# Patient Record
Sex: Female | Born: 1935
Health system: Southern US, Community
[De-identification: ages and names within clinical notes are randomized; demographics above are authoritative.]

## PROBLEM LIST (undated history)

## (undated) DIAGNOSIS — I10 Essential (primary) hypertension: Secondary | ICD-10-CM

## (undated) DIAGNOSIS — D649 Anemia, unspecified: Secondary | ICD-10-CM

## (undated) DIAGNOSIS — M1990 Unspecified osteoarthritis, unspecified site: Secondary | ICD-10-CM

## (undated) DIAGNOSIS — N281 Cyst of kidney, acquired: Secondary | ICD-10-CM

## (undated) DIAGNOSIS — M810 Age-related osteoporosis without current pathological fracture: Secondary | ICD-10-CM

## (undated) HISTORY — PX: BLADDER SUSPENSION: SHX72

## (undated) HISTORY — PX: CATARACT EXTRACTION: SUR2

## (undated) HISTORY — PX: TONSILLECTOMY: SUR1361

## (undated) HISTORY — PX: APPENDECTOMY: SHX54

---

## 1993-10-01 HISTORY — PX: REPLACEMENT TOTAL KNEE: SUR1224

## 1998-07-12 ENCOUNTER — Other Ambulatory Visit: Admission: RE | Admit: 1998-07-12 | Discharge: 1998-07-12 | Payer: Self-pay | Admitting: Obstetrics & Gynecology

## 1998-10-11 ENCOUNTER — Ambulatory Visit (HOSPITAL_COMMUNITY): Admission: RE | Admit: 1998-10-11 | Discharge: 1998-10-11 | Payer: Self-pay | Admitting: Gastroenterology

## 1999-07-24 ENCOUNTER — Other Ambulatory Visit: Admission: RE | Admit: 1999-07-24 | Discharge: 1999-07-24 | Payer: Self-pay | Admitting: Obstetrics & Gynecology

## 2002-10-13 ENCOUNTER — Other Ambulatory Visit: Admission: RE | Admit: 2002-10-13 | Discharge: 2002-10-13 | Payer: Self-pay | Admitting: Obstetrics & Gynecology

## 2003-09-08 ENCOUNTER — Encounter: Admission: RE | Admit: 2003-09-08 | Discharge: 2003-09-08 | Payer: Self-pay

## 2006-01-16 ENCOUNTER — Ambulatory Visit: Payer: Self-pay | Admitting: Internal Medicine

## 2006-01-21 ENCOUNTER — Ambulatory Visit: Payer: Self-pay | Admitting: Gastroenterology

## 2006-01-28 ENCOUNTER — Ambulatory Visit: Payer: Self-pay | Admitting: Internal Medicine

## 2006-02-07 ENCOUNTER — Ambulatory Visit: Payer: Self-pay | Admitting: Internal Medicine

## 2007-08-04 ENCOUNTER — Encounter: Admission: RE | Admit: 2007-08-04 | Discharge: 2007-08-04 | Payer: Self-pay | Admitting: Internal Medicine

## 2013-11-23 ENCOUNTER — Ambulatory Visit: Payer: Self-pay | Admitting: Cardiology

## 2013-12-17 ENCOUNTER — Emergency Department (HOSPITAL_BASED_OUTPATIENT_CLINIC_OR_DEPARTMENT_OTHER)
Admission: EM | Admit: 2013-12-17 | Discharge: 2013-12-17 | Disposition: A | Discharge status: Home / Self Care | Payer: Medicare Other | Attending: Emergency Medicine | Admitting: Emergency Medicine

## 2013-12-17 ENCOUNTER — Encounter (HOSPITAL_BASED_OUTPATIENT_CLINIC_OR_DEPARTMENT_OTHER): Payer: Self-pay | Admitting: Emergency Medicine

## 2013-12-17 ENCOUNTER — Emergency Department (HOSPITAL_BASED_OUTPATIENT_CLINIC_OR_DEPARTMENT_OTHER): Payer: Medicare Other

## 2013-12-17 DIAGNOSIS — Z862 Personal history of diseases of the blood and blood-forming organs and certain disorders involving the immune mechanism: Secondary | ICD-10-CM | POA: Insufficient documentation

## 2013-12-17 DIAGNOSIS — S8010XA Contusion of unspecified lower leg, initial encounter: Secondary | ICD-10-CM | POA: Insufficient documentation

## 2013-12-17 DIAGNOSIS — R296 Repeated falls: Secondary | ICD-10-CM | POA: Insufficient documentation

## 2013-12-17 DIAGNOSIS — Y9389 Activity, other specified: Secondary | ICD-10-CM | POA: Insufficient documentation

## 2013-12-17 DIAGNOSIS — I10 Essential (primary) hypertension: Secondary | ICD-10-CM | POA: Insufficient documentation

## 2013-12-17 DIAGNOSIS — S52509A Unspecified fracture of the lower end of unspecified radius, initial encounter for closed fracture: Secondary | ICD-10-CM

## 2013-12-17 DIAGNOSIS — S52599A Other fractures of lower end of unspecified radius, initial encounter for closed fracture: Secondary | ICD-10-CM | POA: Insufficient documentation

## 2013-12-17 DIAGNOSIS — Y929 Unspecified place or not applicable: Secondary | ICD-10-CM | POA: Insufficient documentation

## 2013-12-17 DIAGNOSIS — Z87448 Personal history of other diseases of urinary system: Secondary | ICD-10-CM | POA: Insufficient documentation

## 2013-12-17 DIAGNOSIS — Z8739 Personal history of other diseases of the musculoskeletal system and connective tissue: Secondary | ICD-10-CM | POA: Insufficient documentation

## 2013-12-17 HISTORY — DX: Anemia, unspecified: D64.9

## 2013-12-17 HISTORY — DX: Age-related osteoporosis without current pathological fracture: M81.0

## 2013-12-17 HISTORY — DX: Cyst of kidney, acquired: N28.1

## 2013-12-17 HISTORY — DX: Essential (primary) hypertension: I10

## 2013-12-17 HISTORY — DX: Unspecified osteoarthritis, unspecified site: M19.90

## 2013-12-17 MED ORDER — HYDROCODONE-ACETAMINOPHEN 5-325 MG PO TABS: 0.5000 | ORAL_TABLET | ORAL | Status: DC | PRN

## 2013-12-17 NOTE — ED Notes (Signed)
Patient transported to X-ray via wheelchair per tech.

## 2013-12-17 NOTE — ED Notes (Signed)
[place] [address] attempting to get into shower PTA.  C/O left wrist pain and bruising to left lower leg.

## 2013-12-17 NOTE — Discharge Instructions (Signed)
Wrist Fracture A wrist fracture is a break in one of the bones of the wrist. Your wrist is made up of several small bones at the palm of your hand (carpal bones) and the two bones that make up your forearm (radius and ulna). The bones come together to form multiple large and small joints. The shape and design of these joints allow your wrist to bend and straighten, move side-to-side, and rotate, as in twisting your palm up or down. CAUSES  A fracture may occur in any of the bones in your wrist when enough force is applied to the wrist, such as when falling down onto an outstretched hand. Severe injuries may occur from a more forceful injury. SYMPTOMS Symptoms of wrist fractures include tenderness, bruising, and swelling. Additionally, the wrist may hang in an odd position or may be misshaped. DIAGNOSIS To diagnose a wrist fracture, your caregiver will physically examine your wrist. Your caregiver may also request an X-ray exam of your wrist. TREATMENT Treatment depends on many factors, including the nature and location of the fracture, your age, and your activity level. Treatment for wrist fracture can be nonsurgical or surgical. For nonsurgical treatment, a plaster cast or splint may be applied to your wrist if the bone is in a good position (aligned). The cast will stay on for about 6 weeks. If the alignment of your bone is not good, it may be necessary to realign (reduce) it. After the bone is reduced, a splint usually is placed on your wrist to allow for a small amount of normal swelling. After about 1 week, the splint is removed and a cast is added. The cast is removed 2 or 3 weeks later, after the swelling goes down, causing the cast to loosen. Another cast is applied. This cast is removed after about another 2 or 3 weeks, for a total of 4 to 6 weeks of immobilization. Sometimes the position of the bone is so far out of place that surgery is required to apply a device to hold it together as it  heals. If the bone cannot be reduced without cutting the skin around the bone (closed reduction), a cut (incision) is made to allow direct access to the bone to reduce it (open reduction). Depending on the fracture, there are a number of options for holding the bone in place while it heals, including a cast, metal pins, a plate and screws, and a device called an external fixator. With an external fixator, most of the hardware remains outside of the body. HOME CARE INSTRUCTIONS  To lessen swelling, keep your injured wrist elevated and move your fingers as much as possible.  Apply ice to your wrist for the first 1 to 2 days after you have been treated or as directed by your caregiver. Applying ice helps to reduce inflammation and pain.  Put ice in a plastic bag.  Place a towel between your skin and the bag.  Leave the ice on for 15 to 20 minutes at a time, every 2 hours while you are awake.  Do not put pressure on any part of your cast or splint. It may break.  Use a plastic bag to protect your cast or splint from water while bathing or showering. Do not lower your cast or splint into water.  Only take over-the-counter or prescription medicines for pain as directed by your caregiver. SEEK IMMEDIATE MEDICAL CARE IF:   Your cast or splint gets damaged or breaks.  You have continued severe pain   or more swelling than you did before the cast was put on.  Your skin or fingernails below the injury turn blue or gray or feel cold or numb.  You develop decreased feeling in your fingers. MAKE SURE YOU:  Understand these instructions.  Will watch your condition.  Will get help right away if you are not doing well or get worse. Document Released: 06/27/2005 Document Revised: 12/10/2011 Document Reviewed: 10/05/2011 Exit[employer] Patient Information 2014 [employer].  

## 2013-12-17 NOTE — ED Provider Notes (Signed)
CSN: [id number]     Arrival date & time 12/17/13  0814 History   First MD Initiated Contact with Patient 12/17/13 [phone]     Chief Complaint  Patient presents with  . Fall  . Wrist Pain     (Consider location/radiation/quality/duration/timing/severity/associated sxs/prior Treatment) HPI Comments: Patient presents to ER for evaluation of left wrist injury. Patient reports that she is trying to get into the shower this morning and lost her balance. She fell, landing on left wrist. She has been having pain in the left wrist that worsens with movement. Pain is moderate. She also has noticed a bruise on her left calf area. She denies ankle or knee pain. There was no head injury or loss of consciousness. Patient denies neck and back pain.  Patient is a 80 y.o. female presenting with fall and wrist pain.  Fall  Wrist Pain    Past Medical History  Diagnosis Date  . Hypertension   . Renal cyst   . OA (osteoarthritis)   . Osteoporosis   . Anemia    Past Surgical History  Procedure Laterality Date  . Cesarean section    . Knee surgery    . Bladder suspension    . Cataract extraction     No family history on file. History  Substance Use Topics  . Smoking status: Never Smoker   . Smokeless tobacco: Never Used  . Alcohol Use: No   OB History   Grav Para Term Preterm Abortions TAB SAB Ect Mult Living                 Review of Systems  Musculoskeletal: Positive for arthralgias. Negative for back pain and neck pain.  Skin:       Bruise  All other systems reviewed and are negative.      Allergies  Review of patient's allergies indicates not on file.  Home Medications  No current outpatient prescriptions on file. BP 159/100  Pulse 61  Temp(Src) 98 F (36.7 C) (Oral)  Resp 16  Ht 5\' 1"  (1.549 m)  Wt 145 lb (65.772 kg)  BMI 27.41 kg/m2  SpO2 100% Physical Exam  Constitutional: She is oriented to person, place, and time. She appears well-developed and well-nourished.  No distress.  HENT:  Head: Normocephalic and atraumatic.  Right Ear: Hearing normal.  Left Ear: Hearing normal.  Nose: Nose normal.  Mouth/Throat: Oropharynx is clear and moist and mucous membranes are normal.  Eyes: Conjunctivae and EOM are normal. Pupils are equal, round, and reactive to light.  Neck: Normal range of motion. Neck supple.  Cardiovascular: Regular rhythm, S1 normal and S2 normal.  Exam reveals no gallop and no friction rub.   No murmur heard. Pulmonary/Chest: Effort normal and breath sounds normal. No respiratory distress. She exhibits no tenderness.  Abdominal: Soft. Normal appearance and bowel sounds are normal. There is no hepatosplenomegaly. There is no tenderness. There is no rebound, no guarding, no tenderness at McBurney's point and negative Murphy's sign. No hernia.  Musculoskeletal: Normal range of motion.       Left wrist: She exhibits tenderness and swelling.       Left lower leg: She exhibits no tenderness and no bony tenderness.       Legs: Neurological: She is alert and oriented to person, place, and time. She has normal strength. No cranial nerve deficit or sensory deficit. Coordination normal. GCS eye subscore is 4. GCS verbal subscore is 5. GCS motor subscore is 6.  Skin: Skin  is warm, dry and intact. No rash noted. No cyanosis.  Psychiatric: She has a normal mood and affect. Her speech is normal and behavior is normal. Thought content normal.    ED Course  Procedures (including critical care time) Labs Review Labs Reviewed - No data to display Imaging Review No results found.   EKG Interpretation None      MDM   Final diagnoses:  None   Patient fell while attempting to get into the shower today and injured her left wrist. She has isolated injury to left wrist, other than slight soft tissue retention the left calf region. No concern for bony injury of the leg. No concern for head injury. Patient has normal neurologic exam, no signs of head  trauma. Neck and back were clinically cleared. X-ray does show wrist fracture. The patient has her own orthopedic doctor that she would like to followup with. She was placed in a splint, will be prescribed hydrocodone to use as needed.   Orpah Greek, MD 12/17/13 1046

## 2015-11-29 DIAGNOSIS — M6281 Muscle weakness (generalized): Secondary | ICD-10-CM | POA: Diagnosis not present

## 2015-12-05 DIAGNOSIS — M6281 Muscle weakness (generalized): Secondary | ICD-10-CM | POA: Diagnosis not present

## 2016-01-26 DIAGNOSIS — H00015 Hordeolum externum left lower eyelid: Secondary | ICD-10-CM | POA: Diagnosis not present

## 2016-01-26 DIAGNOSIS — H00012 Hordeolum externum right lower eyelid: Secondary | ICD-10-CM | POA: Diagnosis not present

## 2016-03-02 DIAGNOSIS — H0012 Chalazion right lower eyelid: Secondary | ICD-10-CM | POA: Diagnosis not present

## 2016-05-02 DIAGNOSIS — C44629 Squamous cell carcinoma of skin of left upper limb, including shoulder: Secondary | ICD-10-CM | POA: Diagnosis not present

## 2016-05-21 DIAGNOSIS — D6489 Other specified anemias: Secondary | ICD-10-CM | POA: Diagnosis not present

## 2016-05-21 DIAGNOSIS — I10 Essential (primary) hypertension: Secondary | ICD-10-CM | POA: Diagnosis not present

## 2016-05-21 DIAGNOSIS — N183 Chronic kidney disease, stage 3 (moderate): Secondary | ICD-10-CM | POA: Diagnosis not present

## 2016-05-21 DIAGNOSIS — Z6827 Body mass index (BMI) 27.0-27.9, adult: Secondary | ICD-10-CM | POA: Diagnosis not present

## 2016-05-21 DIAGNOSIS — I872 Venous insufficiency (chronic) (peripheral): Secondary | ICD-10-CM | POA: Diagnosis not present

## 2016-05-21 DIAGNOSIS — R600 Localized edema: Secondary | ICD-10-CM | POA: Diagnosis not present

## 2016-08-28 DIAGNOSIS — Z961 Presence of intraocular lens: Secondary | ICD-10-CM | POA: Diagnosis not present

## 2016-08-28 DIAGNOSIS — H353132 Nonexudative age-related macular degeneration, bilateral, intermediate dry stage: Secondary | ICD-10-CM | POA: Diagnosis not present

## 2016-08-28 DIAGNOSIS — Z0100 Encounter for examination of eyes and vision without abnormal findings: Secondary | ICD-10-CM | POA: Diagnosis not present

## 2016-09-07 DIAGNOSIS — F5104 Psychophysiologic insomnia: Secondary | ICD-10-CM | POA: Diagnosis not present

## 2016-09-07 DIAGNOSIS — I872 Venous insufficiency (chronic) (peripheral): Secondary | ICD-10-CM | POA: Diagnosis not present

## 2016-09-07 DIAGNOSIS — I351 Nonrheumatic aortic (valve) insufficiency: Secondary | ICD-10-CM | POA: Diagnosis not present

## 2016-09-07 DIAGNOSIS — I10 Essential (primary) hypertension: Secondary | ICD-10-CM | POA: Diagnosis not present

## 2016-09-07 DIAGNOSIS — J310 Chronic rhinitis: Secondary | ICD-10-CM | POA: Diagnosis not present

## 2016-09-07 DIAGNOSIS — Z6827 Body mass index (BMI) 27.0-27.9, adult: Secondary | ICD-10-CM | POA: Diagnosis not present

## 2016-09-07 DIAGNOSIS — N183 Chronic kidney disease, stage 3 (moderate): Secondary | ICD-10-CM | POA: Diagnosis not present

## 2016-09-07 DIAGNOSIS — M810 Age-related osteoporosis without current pathological fracture: Secondary | ICD-10-CM | POA: Diagnosis not present

## 2016-09-07 DIAGNOSIS — E538 Deficiency of other specified B group vitamins: Secondary | ICD-10-CM | POA: Diagnosis not present

## 2016-09-07 DIAGNOSIS — N390 Urinary tract infection, site not specified: Secondary | ICD-10-CM | POA: Diagnosis not present

## 2016-09-07 DIAGNOSIS — D6489 Other specified anemias: Secondary | ICD-10-CM | POA: Diagnosis not present

## 2016-09-07 DIAGNOSIS — Z0000 Encounter for general adult medical examination without abnormal findings: Secondary | ICD-10-CM | POA: Diagnosis not present

## 2016-09-07 DIAGNOSIS — R2689 Other abnormalities of gait and mobility: Secondary | ICD-10-CM | POA: Diagnosis not present

## 2016-09-07 DIAGNOSIS — Z1389 Encounter for screening for other disorder: Secondary | ICD-10-CM | POA: Diagnosis not present

## 2016-09-07 DIAGNOSIS — R8299 Other abnormal findings in urine: Secondary | ICD-10-CM | POA: Diagnosis not present

## 2017-06-13 ENCOUNTER — Encounter: Payer: Self-pay | Admitting: *Deleted

## 2017-07-22 ENCOUNTER — Non-Acute Institutional Stay: Payer: PPO | Admitting: Internal Medicine

## 2017-07-22 ENCOUNTER — Encounter: Payer: Self-pay | Admitting: Internal Medicine

## 2017-07-22 VITALS — BP 124/64 | HR 68 | Temp 97.7°F | Resp 18 | Ht 59.0 in | Wt 151.6 lb

## 2017-07-22 DIAGNOSIS — I739 Peripheral vascular disease, unspecified: Secondary | ICD-10-CM | POA: Insufficient documentation

## 2017-07-22 DIAGNOSIS — F5105 Insomnia due to other mental disorder: Secondary | ICD-10-CM | POA: Insufficient documentation

## 2017-07-22 DIAGNOSIS — D509 Iron deficiency anemia, unspecified: Secondary | ICD-10-CM

## 2017-07-22 DIAGNOSIS — M159 Polyosteoarthritis, unspecified: Secondary | ICD-10-CM | POA: Insufficient documentation

## 2017-07-22 DIAGNOSIS — M810 Age-related osteoporosis without current pathological fracture: Secondary | ICD-10-CM | POA: Diagnosis not present

## 2017-07-22 DIAGNOSIS — M76821 Posterior tibial tendinitis, right leg: Secondary | ICD-10-CM

## 2017-07-22 DIAGNOSIS — R251 Tremor, unspecified: Secondary | ICD-10-CM | POA: Diagnosis not present

## 2017-07-22 DIAGNOSIS — G4700 Insomnia, unspecified: Secondary | ICD-10-CM | POA: Diagnosis not present

## 2017-07-22 DIAGNOSIS — I10 Essential (primary) hypertension: Secondary | ICD-10-CM

## 2017-07-22 DIAGNOSIS — G2581 Restless legs syndrome: Secondary | ICD-10-CM | POA: Diagnosis not present

## 2017-07-22 DIAGNOSIS — H6123 Impacted cerumen, bilateral: Secondary | ICD-10-CM

## 2017-07-22 NOTE — Patient Instructions (Addendum)
  Take melatonin 5 mg daily from tonight. Take it one hour before going to bed.  Change your xanax to every other day at bedtime for 1 week Tuesday, Thursday and Saturday, then take it twice a week for Tuesday and Saturday next week, then take it once a week on Wednesday and stop it.   don't forget to bring your health care power of attorney and living will next visit.

## 2017-07-22 NOTE — Progress Notes (Signed)
[facility]  Provider: Blanchie Serve MD   Location:  [place] of Service:  Clinic (12)  PCP: Blanchie Serve, MD Patient Care Team: Blanchie Serve, MD as PCP - General (Internal Medicine)  Extended Emergency Contact Information Primary Emergency Contact: [facility] Address: [address]          [place] [address] [place] Phone: [phone] Relation: Son Secondary Emergency Contact: Sachdev,Garth Address: [address]          [place], [place] [address] [place] Phone: [phone] Relation: Son  Goals of Care: Advanced Directive information Advanced Directives 07/22/2017  Does Patient Have a Medical Advance Directive? Yes  Type of Paramedic of [place];Living will      Chief Complaint  Patient presents with  . New Patient (Initial Visit)    establish care.   . Medication Refill    No refills needed at this time.     HPI: Patient is a 83 y.o. female seen today to establish care. She was seeing Dr Sharlett Iles as her PCP roughly 6 months back. She followed at [facility]. She would see him once a year for physical and as needed in between. No medical records for review.   Hypertension- currently on metoprolol succinate 50 mg bid, lisinopril 5 mg bid, HCTZ 25 mg daily and lasix 20 mg twice a week.   PVD- currently on lasix 20 mg twice a week. Takes it when she feels like. Last took it 2 weeks back.   Insomnia- takes alprazolam 0.25 mg 1 tab daily at bedtime as needed.   RLS- currently on requip 1 mg qhs.   OA- to multiple joints, denies pain, mobility limited  Posterior tibial tendon dysfunction- with right foot valgus. Has orthotics and deep shoes. No fall reported.   Took influenza vaccine 07/10/17.  Depression screen PHQ 2/9 07/22/2017  Decreased Interest 0  Down, Depressed, Hopeless 0  PHQ - 2 Score 0    Past Medical History:  Diagnosis Date    . Anemia   . Hypertension   . OA (osteoarthritis)   . Osteoporosis   . Renal cyst    Past Surgical History:  Procedure Laterality Date  . APPENDECTOMY    . BLADDER SUSPENSION    . CATARACT EXTRACTION    . CESAREAN SECTION    . REPLACEMENT TOTAL KNEE  1995  . TONSILLECTOMY      reports that she has been smoking E-cigarettes.  She has never used smokeless tobacco. She reports that she does not drink alcohol or use drugs. Social History   Social History  . Marital status: Widowed    Spouse name: N/A  . Number of children: 2  . Years of education: 3   Occupational History  . Retired Marine scientist    Social History Main Topics  . Smoking status: Current Every Day Smoker    Types: E-cigarettes  . Smokeless tobacco: Never Used  . Alcohol use No  . Drug use: No  . Sexual activity: No   Other Topics Concern  . Not on file   Social History Narrative   Coffee-with Caffeine   Widow   2 sons   No pets   Retired Therapist, sports   No exercise   HCPOA, Living Will, No DNR   + glasses   + hearing aids       Functional Status Survey:    Family History  Problem Relation Age of Onset  . Colon cancer Mother   . Arthritis Mother   . Heart disease Father   . Heart attack Father   . Migraines Sister   . Allergies Sister   . Asthma Sister     Health Maintenance  Topic Date Due  . Samul Dada  05/17/1941  . DEXA SCAN  05/16/1989  . PNA vac Low Risk Adult (1 of 2 - PCV13) 05/16/1989  . INFLUENZA VACCINE  05/01/2017    No Known Allergies  Outpatient Encounter Prescriptions as of 07/22/2017  Medication Sig  . ALPRAZolam (XANAX) 0.25 MG tablet Take by mouth at bedtime as needed for anxiety. Take 1-2 tablets  . Biotin 1000 MCG tablet Take 1,000 mcg by mouth daily.  . furosemide (LASIX) 20 MG tablet Take 20 mg by mouth 2 (two) times a week.  . hydrochlorothiazide (MICROZIDE) 12.5 MG capsule Take 25 mg by mouth daily.   Marland Kitchen lisinopril (PRINIVIL,ZESTRIL) 5 MG tablet Take 5 mg by mouth 2  (two) times daily.   . metoprolol succinate (TOPROL-XL) 50 MG 24 hr tablet Take 50 mg by mouth 2 (two) times daily. Take with or immediately following a meal.   . multivitamin-lutein (OCUVITE-LUTEIN) CAPS capsule Take 1 capsule by mouth daily.  Marland Kitchen rOPINIRole (REQUIP) 1 MG tablet Take 1 mg by mouth at bedtime.   . [DISCONTINUED] BIOTIN PO Take 1 tablet by mouth daily.  . [DISCONTINUED] aspirin 81 MG tablet Take 81 mg by mouth daily.  . [DISCONTINUED] cholecalciferol (VITAMIN D) 400 UNITS TABS tablet Take 400 Units by mouth.  . [DISCONTINUED] vitamin B-12 (CYANOCOBALAMIN) 100 MCG tablet Take 100 mcg by mouth daily.   No facility-administered encounter medications on file as of 07/22/2017.     Review of Systems  Constitutional: Negative for appetite change, chills, diaphoresis and fever.  HENT: Positive for hearing loss and rhinorrhea. Negative for congestion, ear discharge, ear pain, mouth sores, nosebleeds, sinus pain, sinus pressure, sore throat and trouble swallowing.        Has hearing aids  Eyes: Positive for visual disturbance. Negative for pain, discharge, redness and itching.       Has corrective glasses and sees eye doctor once a year.   Respiratory: Positive for wheezing. Negative for cough, choking and shortness of breath.   Cardiovascular: Positive for leg swelling. Negative for chest pain and palpitations.       Leg edema improves early in the morning and increases during the day.   Gastrointestinal: Negative for abdominal pain, blood in stool, constipation, diarrhea, nausea and vomiting.  Genitourinary: Negative for dysuria, flank pain, frequency, hematuria, pelvic pain and vaginal discharge.  Musculoskeletal: Positive for gait problem. Negative for arthralgias, back pain and myalgias.       Gets around with motorized wheelchair. Has history of chronic posterior tibial tendon insufficiency of right foot with a pronated valgus foot. Uses custom orthotics and extra depth shoes. Does  not wear her upright brace due to weight.   Skin: Negative for rash and wound.  Neurological: Positive for tremors. Negative for dizziness, seizures, syncope, weakness, numbness and headaches.  Hematological: Bruises/bleeds easily.  Psychiatric/Behavioral: Negative for behavioral problems, decreased concentration, dysphoric mood, hallucinations, self-injury and suicidal ideas.    Vitals:   07/22/17 1400  BP: 124/64  Pulse: 68  Resp: 18  Temp: 97.7 F (36.5 C)  TempSrc: Oral  SpO2: 98%  Weight: 151 lb 9.6 oz (68.8 kg)  Height: 4' 11" (1.499 m)   Body mass index  is 30.62 kg/m. Physical Exam  Constitutional: She is oriented to person, place, and time. She appears well-developed. No distress.  obese  HENT:  Head: Normocephalic and atraumatic.  Nose: Nose normal.  Mouth/Throat: Oropharynx is clear and moist. No oropharyngeal exudate.  Cerumen to both ears right > left  Eyes: Pupils are equal, round, and reactive to light. Conjunctivae and EOM are normal. Right eye exhibits no discharge. Left eye exhibits no discharge. No scleral icterus.  Has corrective glasses  Neck: Normal range of motion. Neck supple. No tracheal deviation present. No thyromegaly present.  Cardiovascular: Normal rate and regular rhythm.   Pulmonary/Chest: Effort normal and breath sounds normal. No respiratory distress. She has no wheezes. She has no rales. She exhibits no tenderness.  Abdominal: Soft. Bowel sounds are normal. She exhibits no distension. There is no tenderness. There is no rebound and no guarding.  Musculoskeletal: She exhibits deformity.  Good ROM with shoulders. Arthritis changes to fingers. 1+ right and trace left leg edema. Pronated right valgus foot. Scoliosis present  Lymphadenopathy:    She has no cervical adenopathy.  Neurological: She is alert and oriented to person, place, and time.  Skin: Skin is warm and dry. She is not diaphoretic.  Chronic stasis changes to her legs  Psychiatric:  She has a normal mood and affect. Her behavior is normal.    Labs reviewed: Basic Metabolic Panel: No results for input(s): NA, K, CL, CO2, GLUCOSE, BUN, CREATININE, CALCIUM, MG, PHOS in the last 8760 hours. Liver Function Tests: No results for input(s): AST, ALT, ALKPHOS, BILITOT, PROT, ALBUMIN in the last 8760 hours. No results for input(s): LIPASE, AMYLASE in the last 8760 hours. No results for input(s): AMMONIA in the last 8760 hours. CBC: No results for input(s): WBC, NEUTROABS, HGB, HCT, MCV, PLT in the last 8760 hours. Cardiac Enzymes: No results for input(s): CKTOTAL, CKMB, CKMBINDEX, TROPONINI in the last 8760 hours. BNP: Invalid input(s): POCBNP No results found for: HGBA1C No results found for: TSH No results found for: VITAMINB12 No results found for: FOLATE No results found for: IRON, TIBC, FERRITIN  Lipid Panel: No results for input(s): CHOL, HDL, LDLCALC, TRIG, CHOLHDL, LDLDIRECT in the last 8760 hours. No results found for: HGBA1C  Procedures since last visit: No results found.  Assessment/Plan  1. Essential hypertension Continue hctz, lisinopril and metoprolol. Monitor BP - CMP with eGFR; Future - Lipid Panel; Future - TSH; Future  2. PVD (peripheral vascular disease) (HCC) Compression stockings advised. Keep legs elevated at rest. Continue skin care.   3. Posterior tibial tendon dysfunction, right Continue orthotics and deep insert shoes. Fall precautions.   4. RLS (restless legs syndrome) Continue requip - TSH; Future - CBC with Differential/Platelets; Future - Ferritin; Future  5. Insomnia, unspecified type Start melatonin 5 mg daily and taper her off xanax that she has been taking on daily basis as in pt instruction.   6. Osteoporosis without current pathological fracture, unspecified osteoporosis type Pt was on fosamax before. No fall or pathological fracture reported. Consider calcium and vit d supplement after reviewing prior records and  labs  7. Osteoarthritis of multiple joints, unspecified osteoarthritis type Takes tylenol if needed. Not taking anything for pain at present.   8. Iron deficiency anemia, unspecified iron deficiency anemia type Not on any iron supplement. Denies melena or rectal bleed. Denies heartburn.  - TSH; Future - CBC with Differential/Platelets; Future - Ferritin; Future  9. Tremors of nervous system Obtain prior records to assess further.  -  TSH; Future  10. Cerumen both ears Pt to get ear lavage from [facility].   Labs/tests ordered:  As above  Next appointment: 6 months  Communication: reviewed care plan with patient and pt agrees with it.     Blanchie Serve, MD Internal Medicine [facility] [address] [place],  [address] Cell Phone (Monday-Friday 8 am - 5 pm): [phone] On Call: [phone] and follow prompts after 5 pm and on weekends Office Phone: [phone] Office Fax: [phone]

## 2017-07-29 DIAGNOSIS — D509 Iron deficiency anemia, unspecified: Secondary | ICD-10-CM | POA: Diagnosis not present

## 2017-07-29 DIAGNOSIS — G2581 Restless legs syndrome: Secondary | ICD-10-CM | POA: Diagnosis not present

## 2017-07-29 DIAGNOSIS — I10 Essential (primary) hypertension: Secondary | ICD-10-CM | POA: Diagnosis not present

## 2017-07-29 DIAGNOSIS — R251 Tremor, unspecified: Secondary | ICD-10-CM | POA: Diagnosis not present

## 2017-07-29 LAB — FERRITIN: Ferritin: 86 ng/mL (ref 20–288)

## 2017-07-29 LAB — COMPLETE METABOLIC PANEL WITH GFR
AG Ratio: 1.4 (calc) (ref 1.0–2.5)
ALT: 12 U/L (ref 6–29)
AST: 19 U/L (ref 10–35)
Albumin: 4 g/dL (ref 3.6–5.1)
Alkaline phosphatase (APISO): 58 U/L (ref 33–130)
BUN/Creatinine Ratio: 22 (calc) (ref 6–22)
BUN: 24 mg/dL (ref 7–25)
CO2: 28 mmol/L (ref 20–32)
Calcium: 9.6 mg/dL (ref 8.6–10.4)
Chloride: 95 mmol/L — ABNORMAL LOW (ref 98–110)
Creat: 1.07 mg/dL — ABNORMAL HIGH (ref 0.60–0.88)
GFR, Est African American: 52 mL/min/{1.73_m2} — ABNORMAL LOW (ref >=60)
GFR, Est Non African American: 45 mL/min/{1.73_m2} — ABNORMAL LOW (ref >=60)
Globulin: 2.8 g/dL (calc) (ref 1.9–3.7)
Glucose, Bld: 103 mg/dL — ABNORMAL HIGH (ref 65–99)
Potassium: 4.1 mmol/L (ref 3.5–5.3)
Sodium: 135 mmol/L (ref 135–146)
Total Bilirubin: 0.8 mg/dL (ref 0.2–1.2)
Total Protein: 6.8 g/dL (ref 6.1–8.1)

## 2017-07-29 LAB — CBC WITH DIFFERENTIAL/PLATELET
Basophils Absolute: 37 cells/uL (ref 0–200)
Basophils Relative: 0.6 %
Eosinophils Absolute: 112 cells/uL (ref 15–500)
Eosinophils Relative: 1.8 %
HCT: 40.1 % (ref 35.0–45.0)
Hemoglobin: 13.9 g/dL (ref 11.7–15.5)
Lymphs Abs: 1984 cells/uL (ref 850–3900)
MCH: 34.8 pg — ABNORMAL HIGH (ref 27.0–33.0)
MCHC: 34.7 g/dL (ref 32.0–36.0)
MCV: 100.3 fL — ABNORMAL HIGH (ref 80.0–100.0)
MPV: 10.4 fL (ref 7.5–12.5)
Monocytes Relative: 10.7 %
Neutro Abs: 3404 cells/uL (ref 1500–7800)
Neutrophils Relative %: 54.9 %
Platelets: 190 10*3/uL (ref 140–400)
RBC: 4 10*6/uL (ref 3.80–5.10)
RDW: 13.8 % (ref 11.0–15.0)
Total Lymphocyte: 32 %
WBC mixed population: 663 cells/uL (ref 200–950)
WBC: 6.2 10*3/uL (ref 3.8–10.8)

## 2017-07-29 LAB — LIPID PANEL
Cholesterol: 189 mg/dL (ref <200)
HDL: 70 mg/dL (ref >50)
LDL Cholesterol (Calc): 102 mg/dL (calc) — ABNORMAL HIGH
Non-HDL Cholesterol (Calc): 119 mg/dL (calc) (ref <130)
Total CHOL/HDL Ratio: 2.7 (calc) (ref <5.0)
Triglycerides: 76 mg/dL (ref <150)

## 2017-07-29 LAB — TSH: TSH: 2.17 mIU/L (ref 0.40–4.50)

## 2017-08-08 ENCOUNTER — Other Ambulatory Visit: Payer: Self-pay | Admitting: Internal Medicine

## 2017-08-26 ENCOUNTER — Other Ambulatory Visit: Payer: Self-pay | Admitting: Internal Medicine

## 2017-09-11 ENCOUNTER — Other Ambulatory Visit: Payer: Self-pay | Admitting: Internal Medicine

## 2017-09-12 ENCOUNTER — Other Ambulatory Visit: Payer: Self-pay | Admitting: *Deleted

## 2017-09-12 ENCOUNTER — Telehealth: Payer: Self-pay | Admitting: *Deleted

## 2017-09-12 MED ORDER — ALPRAZOLAM 0.25 MG PO TABS: 0.2500 mg | ORAL_TABLET | Freq: Every evening | ORAL | 0 refills | Status: DC | PRN

## 2017-09-12 NOTE — Telephone Encounter (Signed)
Patient came to clinic and advised she spoke to [place] on her way out yesterday and asked for it to be sent in. I took care of it. #30 0RF per Dinah. Pt sees Dr. Bubba Camp next month.

## 2017-09-12 NOTE — Telephone Encounter (Signed)
Patient called and stated that she spoke with a CMA at [facility] yesterday and they were going to call in her Xanax Rx. Patient stated this has not been done. Needs Rx sent to [facility]acy. Please Verify instructions and send to [facility].

## 2017-09-25 ENCOUNTER — Other Ambulatory Visit: Payer: Self-pay | Admitting: Internal Medicine

## 2017-10-08 ENCOUNTER — Other Ambulatory Visit: Payer: Self-pay | Admitting: Internal Medicine

## 2017-10-10 ENCOUNTER — Other Ambulatory Visit: Payer: Self-pay | Admitting: Internal Medicine

## 2017-10-11 ENCOUNTER — Other Ambulatory Visit: Payer: Self-pay | Admitting: *Deleted

## 2017-10-11 MED ORDER — ALPRAZOLAM 0.25 MG PO TABS: 0.2500 mg | ORAL_TABLET | Freq: Every evening | ORAL | 0 refills | Status: DC | PRN

## 2017-10-11 NOTE — Telephone Encounter (Signed)
Verbal order called in. Requip was filled on 10/08/17

## 2017-10-11 NOTE — Telephone Encounter (Signed)
Patient called requesting refill on alprazolam 0.25 #30 0RF to [facility]. Called in as prescribed.

## 2017-10-14 ENCOUNTER — Telehealth: Payer: Self-pay | Admitting: *Deleted

## 2017-10-14 DIAGNOSIS — I10 Essential (primary) hypertension: Secondary | ICD-10-CM

## 2017-10-14 DIAGNOSIS — R251 Tremor, unspecified: Secondary | ICD-10-CM

## 2017-10-14 DIAGNOSIS — G2581 Restless legs syndrome: Secondary | ICD-10-CM

## 2017-10-14 DIAGNOSIS — N183 Chronic kidney disease, stage 3 unspecified: Secondary | ICD-10-CM

## 2017-10-14 DIAGNOSIS — D509 Iron deficiency anemia, unspecified: Secondary | ICD-10-CM

## 2017-10-14 NOTE — Addendum Note (Signed)
Addended by: Royann Shivers A on: 10/14/2017 01:26 PM   Modules accepted: Orders

## 2017-10-14 NOTE — Telephone Encounter (Signed)
Orders entered for labs

## 2017-10-31 DIAGNOSIS — N183 Chronic kidney disease, stage 3 (moderate): Secondary | ICD-10-CM | POA: Diagnosis not present

## 2017-10-31 LAB — BASIC METABOLIC PANEL WITH GFR
BUN / CREAT RATIO: 25 (calc) — AB (ref 6–22)
BUN: 23 mg/dL (ref 7–25)
CHLORIDE: 96 mmol/L — AB (ref 98–110)
CO2: 33 mmol/L — AB (ref 20–32)
CREATININE: 0.91 mg/dL — AB (ref 0.60–0.88)
Calcium: 9.1 mg/dL (ref 8.6–10.4)
GFR, Est African American: 63 mL/min/{1.73_m2} (ref >=60)
GFR, Est Non African American: 54 mL/min/{1.73_m2} — ABNORMAL LOW (ref >=60)
GLUCOSE: 91 mg/dL (ref 65–99)
POTASSIUM: 3.8 mmol/L (ref 3.5–5.3)
SODIUM: 136 mmol/L (ref 135–146)

## 2017-11-19 ENCOUNTER — Other Ambulatory Visit: Payer: Self-pay | Admitting: Internal Medicine

## 2017-11-25 ENCOUNTER — Other Ambulatory Visit: Payer: Self-pay | Admitting: Internal Medicine

## 2017-11-25 NOTE — Telephone Encounter (Signed)
[facility] Database Verified.

## 2017-11-26 ENCOUNTER — Telehealth: Payer: Self-pay | Admitting: Internal Medicine

## 2017-11-26 NOTE — Telephone Encounter (Signed)
I called the pt to schedule her AWV-S at [facility] clinic on either 11/27/17 or 12/06/17.  There was no answer and no option to leave a message. VDM (DD)

## 2017-11-27 ENCOUNTER — Other Ambulatory Visit: Payer: Self-pay | Admitting: *Deleted

## 2017-11-27 MED ORDER — HYDROCHLOROTHIAZIDE 12.5 MG PO CAPS: 25.0000 mg | ORAL_CAPSULE | Freq: Every day | ORAL | 3 refills | Status: DC

## 2017-11-27 NOTE — Telephone Encounter (Signed)
Patient requested 

## 2017-12-10 ENCOUNTER — Other Ambulatory Visit: Payer: Self-pay | Admitting: Internal Medicine

## 2017-12-18 NOTE — Telephone Encounter (Signed)
I called the pt to schedule with Clarise Cruz at [facility] clinic on either afternoon of 01/01/18 or 01/03/18. There was no answer and no option to leave a message. VDM (DD)

## 2017-12-20 ENCOUNTER — Other Ambulatory Visit: Payer: Self-pay | Admitting: Internal Medicine

## 2017-12-25 ENCOUNTER — Other Ambulatory Visit: Payer: Self-pay | Admitting: Internal Medicine

## 2017-12-25 NOTE — Telephone Encounter (Signed)
Patient requested refill. Called to pharmacy.

## 2018-01-01 ENCOUNTER — Non-Acute Institutional Stay: Payer: PPO

## 2018-01-01 VITALS — BP 122/70 | HR 61 | Temp 98.1°F | Ht 59.0 in | Wt 152.0 lb

## 2018-01-01 DIAGNOSIS — Z0000 Encounter for general adult medical examination without abnormal findings: Secondary | ICD-10-CM | POA: Diagnosis not present

## 2018-01-01 MED ORDER — ZOSTER VAC RECOMB ADJUVANTED 50 MCG/0.5ML IM SUSR: 0.5000 mL | Freq: Once | INTRAMUSCULAR | 1 refills | Status: AC

## 2018-01-01 NOTE — Progress Notes (Signed)
Subjective:   Ann Jensen is a 84 y.o. female who presents for Medicare Annual (Subsequent) preventive examination at [facility]  Last AWV-09/07/2016       Objective:     Vitals: BP 122/70 (BP Location: Left Arm, Patient Position: Sitting)   Pulse 61   Temp 98.1 F (36.7 C) (Oral)   Ht 4\' 11"  (1.499 m)   Wt 152 lb (68.9 kg)   SpO2 92%   BMI 30.70 kg/m   Body mass index is 30.7 kg/m.  Advanced Directives 01/01/2018 07/22/2017  Does Patient Have a Medical Advance Directive? Yes Yes  Type of Paramedic of [place];Living will;Out of facility DNR (pink MOST or yellow form) [place];Living will  Does patient want to make changes to medical advance directive? No - Patient declined -  Copy of [place] in Chart? Yes -  Pre-existing out of facility DNR order (yellow form or pink MOST form) Yellow form placed in chart (order not valid for inpatient use) -    Tobacco Social History   Tobacco Use  Smoking Status Former Smoker  . Types: E-cigarettes  Smokeless Tobacco Never Used  Tobacco Comment   when she was 84 years old     Counseling given: Not Answered Comment: when she was 84 years old   Clinical Intake:  Pre-visit preparation completed: No  Pain : No/denies pain     Diabetes: No  How often do you need to have someone help you when you read instructions, pamphlets, or other written materials from your doctor or pharmacy?: 1 - Never What is the last grade level you completed in school?: RN  Interpreter Needed?: No  Information entered by :: Tyson Dense RN  Past Medical History:  Diagnosis Date  . Anemia   . Hypertension   . OA (osteoarthritis)   . Osteoporosis   . Renal cyst    Past Surgical History:  Procedure Laterality Date  . APPENDECTOMY    . BLADDER SUSPENSION    . CATARACT EXTRACTION    . CESAREAN SECTION    . REPLACEMENT TOTAL KNEE  1995  .  TONSILLECTOMY     Family History  Problem Relation Age of Onset  . Colon cancer Mother   . Arthritis Mother   . Heart disease Father   . Heart attack Father   . Migraines Sister   . Allergies Sister   . Asthma Sister    Social History   Socioeconomic History  . Marital status: Widowed    Spouse name: Not on file  . Number of children: 2  . Years of education: 3  . Highest education level: Not on file  Occupational History  . Occupation: Retired Ship broker  . Financial resource strain: Not hard at all  . Food insecurity:    Worry: Never true    Inability: Never true  . Transportation needs:    Medical: No    Non-medical: No  Tobacco Use  . Smoking status: Former Smoker    Types: E-cigarettes  . Smokeless tobacco: Never Used  . Tobacco comment: when she was 84 years old  Substance and Sexual Activity  . Alcohol use: No  . Drug use: No  . Sexual activity: Never  Lifestyle  . Physical activity:    Days per week: 0 days    Minutes per session: 0 min  . Stress: Not at all  Relationships  .  Social connections:    Talks on phone: More than three times a week    Gets together: More than three times a week    Attends religious service: Never    Active member of club or organization: No    Attends meetings of clubs or organizations: Never    Relationship status: Widowed  Other Topics Concern  . Not on file  Social History Narrative   Coffee-with Caffeine   Widow   2 sons   No pets   Retired Therapist, sports   No exercise   HCPOA, Living Will, No DNR   + glasses   + hearing aids    Outpatient Encounter Medications as of 01/01/2018  Medication Sig  . ALPRAZolam (XANAX) 0.25 MG tablet TAKE 1 TABLET AT BEDTIME AS NEEDED.  Marland Kitchen Biotin 1000 MCG tablet Take 1,000 mcg by mouth daily.  . furosemide (LASIX) 20 MG tablet TAKE 1 TABLET IN THE MORNING UP TO 2 TIMES PER WEEK.  . hydrochlorothiazide (MICROZIDE) 12.5 MG capsule Take 2 capsules (25 mg total) by mouth daily.  Marland Kitchen  lisinopril (PRINIVIL,ZESTRIL) 5 MG tablet TAKE 1 TABLET BY MOUTH TWICE DAILY.  . metoprolol succinate (TOPROL-XL) 50 MG 24 hr tablet Take 50 mg by mouth 2 (two) times daily. Take with or immediately following a meal.   . metoprolol tartrate (LOPRESSOR) 50 MG tablet TAKE 1 TABLET BY MOUTH TWICE DAILY.  . multivitamin-lutein (OCUVITE-LUTEIN) CAPS capsule Take 1 capsule by mouth daily.  Marland Kitchen rOPINIRole (REQUIP) 1 MG tablet TAKE 1 TABLET EACH EVENING.  Marland Kitchen Zoster Vaccine Adjuvanted [facility]) injection Inject 0.5 mLs into the muscle once for 1 dose.  . [DISCONTINUED] Zoster Vaccine Adjuvanted [facility]) injection Inject 0.5 mLs into the muscle once.   No facility-administered encounter medications on file as of 01/01/2018.     Activities of Daily Living In your present state of health, do you have any difficulty performing the following activities: 01/01/2018  Hearing? N  Vision? N  Difficulty concentrating or making decisions? N  Walking or climbing stairs? Y  Dressing or bathing? N  Doing errands, shopping? Y  Preparing Food and eating ? N  Using the Toilet? N  In the past six months, have you accidently leaked urine? N  Do you have problems with loss of bowel control? N  Managing your Medications? N  Managing your Finances? N  Housekeeping or managing your Housekeeping? N  Some recent data might be hidden    Patient Care Team: Blanchie Serve, MD as PCP - General (Internal Medicine)    Assessment:   This is a routine wellness examination for Ann Jensen.  Exercise Activities and Dietary recommendations Current Exercise Habits: The patient does not participate in regular exercise at present, Exercise limited by: orthopedic condition(s)  Goals    None      Fall Risk Fall Risk  01/01/2018 07/22/2017  Falls in the past year? No No   Is the patient's home free of loose throw rugs in walkways, pet beds, electrical cords, etc?   yes      Grab bars in the bathroom? yes      Handrails on the  stairs?   yes      Adequate lighting?   yes  Depression Screen PHQ 2/9 Scores 01/01/2018 07/22/2017  PHQ - 2 Score 0 0     Cognitive Function MMSE - Mini Mental State Exam 01/01/2018  Orientation to time 5  Orientation to Place 5  Registration 3  Attention/ Calculation 4  Recall  1  Language- name 2 objects 2  Language- repeat 1  Language- follow 3 step command 3  Language- read & follow direction 1  Write a sentence 1  Copy design 0  Total score 26        Immunization History  Administered Date(s) Administered  . Influenza-Unspecified 07/10/2017  . Pneumococcal Conjugate-13 08/18/2014  . Pneumococcal Polysaccharide-23 07/26/2010    Qualifies for Shingles Vaccine? Yes, educated and prescription sent to pharmacy  Screening Tests Health Maintenance  Topic Date Due  . Samul Dada  05/17/1941  . DEXA SCAN  01/02/2019 (Originally 05/16/1989)  . INFLUENZA VACCINE  05/01/2018  . PNA vac Low Risk Adult  Completed    Cancer Screenings: Lung: Low Dose CT Chest recommended if Age 1-80 years, 30 pack-year currently smoking OR have quit w/in 15years. Patient does not qualify. Breast:  Up to date on Mammogram? Yes   Up to date of Bone Density/Dexa? No, declined Colorectal: up to date  Additional Screenings: : Hepatitis C Screening: declined TDAP possibly due-patient wants to check records at home      Plan:    I have personally reviewed and addressed the Medicare Annual Wellness questionnaire and have noted the following in the patient's chart:  A. Medical and social history B. Use of alcohol, tobacco or illicit drugs  C. Current medications and supplements D. Functional ability and status E.  Nutritional status F.  Physical activity G. Advance directives H. List of other physicians I.  Hospitalizations, surgeries, and ER visits in previous 12 months J.  [place] to include hearing, vision, cognitive, depression L. Referrals and appointments - none  In  addition, I have reviewed and discussed with patient certain preventive protocols, quality metrics, and best practice recommendations. A written personalized care plan for preventive services as well as general preventive health recommendations were provided to patient.  See attached scanned questionnaire for additional information.   Signed,   Tyson Dense, RN Nurse Health Advisor  Patient Concerns:None

## 2018-01-01 NOTE — Patient Instructions (Signed)
[name] , Thank you for taking time to come for your Medicare Wellness Visit. I appreciate your ongoing commitment to your health goals. Please review the following plan we discussed and let me know if I can assist you in the future.   Screening recommendations/referrals: Colonoscopy excluded, you are over age 82 Mammogram excluded, you are over age 61 Bone Density due, declined Recommended yearly ophthalmology/optometry visit for glaucoma screening and checkup Recommended yearly dental visit for hygiene and checkup  Vaccinations: Influenza vaccine up to date, due 2019 fall season Pneumococcal vaccine up to date, completed Tdap vaccine please check records and bring it with you at your next appointment Shingles vaccine due, prescription sent to pharmacy    Advanced directives: in chart  Conditions/risks identified: none  Next appointment: Dr. Bubba Camp 01/20/2018 @ 1:30pm   Preventive Care 65 Years and Older, Female Preventive care refers to lifestyle choices and visits with your health care provider that can promote health and wellness. What does preventive care include?  A yearly physical exam. This is also called an annual well check.  Dental exams once or twice a year.  Routine eye exams. Ask your health care provider how often you should have your eyes checked.  Personal lifestyle choices, including:  Daily care of your teeth and gums.  Regular physical activity.  Eating a healthy diet.  Avoiding tobacco and drug use.  Limiting alcohol use.  Practicing safe sex.  Taking low-dose aspirin every day.  Taking vitamin and mineral supplements as recommended by your health care provider. What happens during an annual well check? The services and screenings done by your health care provider during your annual well check will depend on your age, overall health, lifestyle risk factors, and family history of disease. Counseling  Your health care provider may ask you  questions about your:  Alcohol use.  Tobacco use.  Drug use.  Emotional well-being.  Home and relationship well-being.  Sexual activity.  Eating habits.  History of falls.  Memory and ability to understand (cognition).  Work and work Statistician.  Reproductive health. Screening  You may have the following tests or measurements:  Height, weight, and BMI.  Blood pressure.  Lipid and cholesterol levels. These may be checked every 5 years, or more frequently if you are over 84 years old.  Skin check.  Lung cancer screening. You may have this screening every year starting at age 85 if you have a 30-pack-year history of smoking and currently smoke or have quit within the past 15 years.  Fecal occult blood test (FOBT) of the stool. You may have this test every year starting at age 56.  Flexible sigmoidoscopy or colonoscopy. You may have a sigmoidoscopy every 5 years or a colonoscopy every 10 years starting at age 86.  Hepatitis C blood test.  Hepatitis B blood test.  Sexually transmitted disease (STD) testing.  Diabetes screening. This is done by checking your blood sugar (glucose) after you have not eaten for a while (fasting). You may have this done every 1-3 years.  Bone density scan. This is done to screen for osteoporosis. You may have this done starting at age 25.  Mammogram. This may be done every 1-2 years. Talk to your health care provider about how often you should have regular mammograms. Talk with your health care provider about your test results, treatment options, and if necessary, the need for more tests. Vaccines  Your health care provider may recommend certain vaccines, such as:  Influenza vaccine.  This is recommended every year.  Tetanus, diphtheria, and acellular pertussis (Tdap, Td) vaccine. You may need a Td booster every 10 years.  Zoster vaccine. You may need this after age 50.  Pneumococcal 13-valent conjugate (PCV13) vaccine. One dose is  recommended after age 79.  Pneumococcal polysaccharide (PPSV23) vaccine. One dose is recommended after age 75. Talk to your health care provider about which screenings and vaccines you need and how often you need them. This information is not intended to replace advice given to you by your health care provider. Make sure you discuss any questions you have with your health care provider. Document Released: 10/14/2015 Document Revised: 06/06/2016 Document Reviewed: 07/19/2015 Elsevier Interactive Patient Education  2017 [place] Prevention in the Home Falls can cause injuries. They can happen to people of all ages. There are many things you can do to make your home safe and to help prevent falls. What can I do on the outside of my home?  Regularly fix the edges of walkways and driveways and fix any cracks.  Remove anything that might make you trip as you walk through a door, such as a raised step or threshold.  Trim any bushes or trees on the path to your home.  Use bright outdoor lighting.  Clear any walking paths of anything that might make someone trip, such as rocks or tools.  Regularly check to see if handrails are loose or broken. Make sure that both sides of any steps have handrails.  Any raised decks and porches should have guardrails on the edges.  Have any leaves, snow, or ice cleared regularly.  Use sand or salt on walking paths during winter.  Clean up any spills in your garage right away. This includes oil or grease spills. What can I do in the bathroom?  Use night lights.  Install grab bars by the toilet and in the tub and shower. Do not use towel bars as grab bars.  Use non-skid mats or decals in the tub or shower.  If you need to sit down in the shower, use a plastic, non-slip stool.  Keep the floor dry. Clean up any water that spills on the floor as soon as it happens.  Remove soap buildup in the tub or shower regularly.  Attach bath mats  securely with double-sided non-slip rug tape.  Do not have throw rugs and other things on the floor that can make you trip. What can I do in the bedroom?  Use night lights.  Make sure that you have a light by your bed that is easy to reach.  Do not use any sheets or blankets that are too big for your bed. They should not hang down onto the floor.  Have a firm chair that has side arms. You can use this for support while you get dressed.  Do not have throw rugs and other things on the floor that can make you trip. What can I do in the kitchen?  Clean up any spills right away.  Avoid walking on wet floors.  Keep items that you use a lot in easy-to-reach places.  If you need to reach something above you, use a strong step stool that has a grab bar.  Keep electrical cords out of the way.  Do not use floor polish or wax that makes floors slippery. If you must use wax, use non-skid floor wax.  Do not have throw rugs and other things on the floor that can make  you trip. What can I do with my stairs?  Do not leave any items on the stairs.  Make sure that there are handrails on both sides of the stairs and use them. Fix handrails that are broken or loose. Make sure that handrails are as long as the stairways.  Check any carpeting to make sure that it is firmly attached to the stairs. Fix any carpet that is loose or worn.  Avoid having throw rugs at the top or bottom of the stairs. If you do have throw rugs, attach them to the floor with carpet tape.  Make sure that you have a light switch at the top of the stairs and the bottom of the stairs. If you do not have them, ask someone to add them for you. What else can I do to help prevent falls?  Wear shoes that:  Do not have high heels.  Have rubber bottoms.  Are comfortable and fit you well.  Are closed at the toe. Do not wear sandals.  If you use a stepladder:  Make sure that it is fully opened. Do not climb a closed  stepladder.  Make sure that both sides of the stepladder are locked into place.  Ask someone to hold it for you, if possible.  Clearly mark and make sure that you can see:  Any grab bars or handrails.  First and last steps.  Where the edge of each step is.  Use tools that help you move around (mobility aids) if they are needed. These include:  Canes.  Walkers.  Scooters.  Crutches.  Turn on the lights when you go into a dark area. Replace any light bulbs as soon as they burn out.  Set up your furniture so you have a clear path. Avoid moving your furniture around.  If any of your floors are uneven, fix them.  If there are any pets around you, be aware of where they are.  Review your medicines with your doctor. Some medicines can make you feel dizzy. This can increase your chance of falling. Ask your doctor what other things that you can do to help prevent falls. This information is not intended to replace advice given to you by your health care provider. Make sure you discuss any questions you have with your health care provider. Document Released: 07/14/2009 Document Revised: 02/23/2016 Document Reviewed: 10/22/2014 Elsevier Interactive Patient Education  2017 [employer].

## 2018-01-20 ENCOUNTER — Non-Acute Institutional Stay: Payer: PPO | Admitting: Internal Medicine

## 2018-01-20 ENCOUNTER — Encounter: Payer: Self-pay | Admitting: Internal Medicine

## 2018-01-20 VITALS — BP 124/66 | HR 60 | Temp 97.9°F | Resp 16 | Ht 59.0 in | Wt 149.8 lb

## 2018-01-20 DIAGNOSIS — M159 Polyosteoarthritis, unspecified: Secondary | ICD-10-CM

## 2018-01-20 DIAGNOSIS — G2581 Restless legs syndrome: Secondary | ICD-10-CM

## 2018-01-20 DIAGNOSIS — E785 Hyperlipidemia, unspecified: Secondary | ICD-10-CM

## 2018-01-20 DIAGNOSIS — D649 Anemia, unspecified: Secondary | ICD-10-CM | POA: Insufficient documentation

## 2018-01-20 DIAGNOSIS — H6123 Impacted cerumen, bilateral: Secondary | ICD-10-CM

## 2018-01-20 DIAGNOSIS — N183 Chronic kidney disease, stage 3 unspecified: Secondary | ICD-10-CM

## 2018-01-20 DIAGNOSIS — I739 Peripheral vascular disease, unspecified: Secondary | ICD-10-CM | POA: Diagnosis not present

## 2018-01-20 DIAGNOSIS — D539 Nutritional anemia, unspecified: Secondary | ICD-10-CM

## 2018-01-20 DIAGNOSIS — E669 Obesity, unspecified: Secondary | ICD-10-CM | POA: Diagnosis not present

## 2018-01-20 DIAGNOSIS — R739 Hyperglycemia, unspecified: Secondary | ICD-10-CM | POA: Diagnosis not present

## 2018-01-20 DIAGNOSIS — G4700 Insomnia, unspecified: Secondary | ICD-10-CM | POA: Diagnosis not present

## 2018-01-20 DIAGNOSIS — I10 Essential (primary) hypertension: Secondary | ICD-10-CM | POA: Diagnosis not present

## 2018-01-20 HISTORY — DX: Obesity, unspecified: E66.9

## 2018-01-20 MED ORDER — ALPRAZOLAM 0.25 MG PO TABS: ORAL_TABLET | ORAL | 0 refills | Status: DC

## 2018-01-20 MED ORDER — FUROSEMIDE 20 MG PO TABS: 20.0000 mg | ORAL_TABLET | Freq: Every day | ORAL | 3 refills | Status: DC

## 2018-01-20 MED ORDER — TRAZODONE HCL 50 MG PO TABS: 50.0000 mg | ORAL_TABLET | Freq: Every day | ORAL | 3 refills | Status: DC

## 2018-01-20 NOTE — Progress Notes (Signed)
[facility]  Provider: Blanchie Serve MD   Location:  [place] of Service:  Clinic (12)  PCP: Blanchie Serve, MD Patient Care Team: Blanchie Serve, MD as PCP - General (Internal Medicine)  Extended Emergency Contact Information Primary Emergency Contact: [facility] Address: [address]          [place] [address] [place] Phone: [phone] Relation: Son Secondary Emergency Contact: Taitt,Garth Address: [address]          [place], [place] [address] [place] Phone: [phone] Relation: Son  Goals of Care: Advanced Directive information Advanced Directives 01/01/2018  Does Patient Have a Medical Advance Directive? Yes  Type of Paramedic of [place];Living will;Out of facility DNR (pink MOST or yellow form)  Does patient want to make changes to medical advance directive? No - Patient declined  Copy of [place] in Chart? Yes  Pre-existing out of facility DNR order (yellow form or pink MOST form) Yellow form placed in chart (order not valid for inpatient use)      Chief Complaint  Patient presents with  . Medical Management of Chronic Issues    6 month follow up. No acute concerns at this time.   . Medication Refill    No refills needed at this time    HPI: Patient is a 84 y.o. female seen today for routine visit.   Osteoarthritis- uses motorized wheelchair, no fall reported. Denies pain but has her right leg give away at the knee.   Insomnia- taking alprazolam every night at present 0.25 mg and this helps her fall asleep but unable to help her stay asleep. Melatonin did not help in past.   Hypertension- takes metoprolol tartrate 50 mg bid, lisinopril 5 mg bid and HCTZ 25 mg daily.  PVD- on lasix 20 mg daily, takes it when she remembers, last took it few weeks back.   RLS- takes requip with some help.   Past Medical History:  Diagnosis  Date  . Anemia   . Hypertension   . OA (osteoarthritis)   . Osteoporosis   . Renal cyst    Past Surgical History:  Procedure Laterality Date  . APPENDECTOMY    . BLADDER SUSPENSION    . CATARACT EXTRACTION    . CESAREAN SECTION    . REPLACEMENT TOTAL KNEE  1995  . TONSILLECTOMY      reports that she has quit smoking. Her smoking use included e-cigarettes. She has never used smokeless tobacco. She reports that she does not drink alcohol or use drugs. Social History   Socioeconomic History  . Marital status: Widowed    Spouse name: Not on file  . Number of children: 2  . Years of education: 3  . Highest education level: Not on file  Occupational History  . Occupation: Retired Ship broker  . Financial resource strain: Not hard at all  . Food insecurity:    Worry: Never true    Inability: Never true  . Transportation needs:    Medical: No    Non-medical: No  Tobacco Use  . Smoking status: Former Smoker    Types: E-cigarettes  . Smokeless tobacco: Never Used  . Tobacco comment: when she was 84 years old  Substance and Sexual Activity  . Alcohol use: No  . Drug use: No  . Sexual activity: Never  Lifestyle  . Physical activity:  Days per week: 0 days    Minutes per session: 0 min  . Stress: Not at all  Relationships  . Social connections:    Talks on phone: More than three times a week    Gets together: More than three times a week    Attends religious service: Never    Active member of club or organization: No    Attends meetings of clubs or organizations: Never    Relationship status: Widowed  . Intimate partner violence:    Fear of current or ex partner: No    Emotionally abused: No    Physically abused: No    Forced sexual activity: No  Other Topics Concern  . Not on file  Social History Narrative   Coffee-with Caffeine   Widow   2 sons   No pets   Retired Therapist, sports   No exercise   HCPOA, Living Will, No DNR   + glasses   + hearing aids     Functional Status Survey:    Family History  Problem Relation Age of Onset  . Colon cancer Mother   . Arthritis Mother   . Heart disease Father   . Heart attack Father   . Migraines Sister   . Allergies Sister   . Asthma Sister     Health Maintenance  Topic Date Due  . Samul Dada  05/17/1941  . DEXA SCAN  01/02/2019 (Originally 05/16/1989)  . INFLUENZA VACCINE  05/01/2018  . PNA vac Low Risk Adult  Completed    No Known Allergies  Outpatient Encounter Medications as of 01/20/2018  Medication Sig  . ALPRAZolam (XANAX) 0.25 MG tablet TAKE 1 TABLET AT BEDTIME AS NEEDED.  Marland Kitchen Biotin 1000 MCG tablet Take 1,000 mcg by mouth daily.  . furosemide (LASIX) 20 MG tablet TAKE 1 TABLET IN THE MORNING UP TO 2 TIMES PER WEEK.  . hydrochlorothiazide (MICROZIDE) 12.5 MG capsule Take 2 capsules (25 mg total) by mouth daily.  Marland Kitchen lisinopril (PRINIVIL,ZESTRIL) 5 MG tablet TAKE 1 TABLET BY MOUTH TWICE DAILY.  . metoprolol succinate (TOPROL-XL) 50 MG 24 hr tablet Take 50 mg by mouth 2 (two) times daily. Take with or immediately following a meal.   . multivitamin-lutein (OCUVITE-LUTEIN) CAPS capsule Take 1 capsule by mouth daily.  Marland Kitchen rOPINIRole (REQUIP) 1 MG tablet TAKE 1 TABLET EACH EVENING.  . metoprolol tartrate (LOPRESSOR) 50 MG tablet TAKE 1 TABLET BY MOUTH TWICE DAILY.   No facility-administered encounter medications on file as of 01/20/2018.     Review of Systems  Constitutional: Positive for fatigue. Negative for appetite change, chills and fever.  HENT: Positive for rhinorrhea. Negative for congestion, ear discharge, ear pain, hearing loss, mouth sores, sinus pressure, sinus pain, sore throat and trouble swallowing.        Clear nasal discharge  Eyes: Positive for visual disturbance. Negative for discharge and itching.       Wears corrective glasses, sees eye doctor once a year, s/p cataract surgery  Respiratory: Negative for cough, choking, chest tightness, shortness of breath and  wheezing.   Cardiovascular: Positive for leg swelling. Negative for chest pain and palpitations.       Has leg edema that increases towards the end of the day, takes lasix upto 2 times a week. Denies any weeping or discharge from legs  Gastrointestinal: Negative for abdominal pain, blood in stool, constipation, diarrhea, nausea and vomiting.       Denies heartburn or reflux  Genitourinary: Negative for dysuria, flank pain,  hematuria, pelvic pain and vaginal discharge.       Gets up once at night to urinate, denies incontinence  Musculoskeletal: Positive for gait problem. Negative for arthralgias, back pain and joint swelling.       S/p right knee replacement in 1995, feels her right knee to give away, no fall reported, uses motorized wheelchair to ambulate  Skin: Negative for rash and wound.  Neurological: Negative for dizziness, seizures, syncope, light-headedness and headaches.       Has RLS and takes requip  Hematological: Bruises/bleeds easily.  Psychiatric/Behavioral: Positive for sleep disturbance. Negative for behavioral problems, confusion and suicidal ideas. The patient is not nervous/anxious.     Vitals:   01/20/18 1321  BP: 124/66  Pulse: 60  Resp: 16  Temp: 97.9 F (36.6 C)  TempSrc: Oral  SpO2: 91%  Height: 4\' 11"  (1.499 m)   Body mass index is 30.7 kg/m.   Wt Readings from Last 3 Encounters:  01/01/18 152 lb (68.9 kg)  07/22/17 151 lb 9.6 oz (68.8 kg)  12/17/13 145 lb (65.8 kg)   Physical Exam  Constitutional: She is oriented to person, place, and time. No distress.  Obese elderly female  HENT:  Head: Normocephalic and atraumatic.  Right Ear: External ear normal.  Left Ear: External ear normal.  Nose: Nose normal.  Mouth/Throat: Oropharynx is clear and moist. No oropharyngeal exudate.  Cerumen to both ears  Eyes: Pupils are equal, round, and reactive to light. Conjunctivae and EOM are normal. Right eye exhibits no discharge. Left eye exhibits no discharge.    Neck: Normal range of motion. Neck supple.  Cardiovascular: Normal rate, regular rhythm and intact distal pulses.  Murmur heard. Pulmonary/Chest: Effort normal and breath sounds normal. No respiratory distress. She has no wheezes. She has no rales.  Abdominal: Soft. Bowel sounds are normal. There is no tenderness.  Musculoskeletal: She exhibits edema and deformity.  Wheelchair for ambulation, right knee gives away, swelling to both legs, deformity of right foot  Lymphadenopathy:    She has no cervical adenopathy.  Neurological: She is alert and oriented to person, place, and time.  Skin: Skin is warm and dry. Capillary refill takes 2 to 3 seconds. She is not diaphoretic.  Psychiatric: She has a normal mood and affect. Her behavior is normal.    Labs reviewed: Basic Metabolic Panel: Recent Labs    07/29/17 0725 10/31/17 0840  NA 135 136  K 4.1 3.8  CL 95* 96*  CO2 28 33*  GLUCOSE 103* 91  BUN 24 23  CREATININE 1.07* 0.91*  CALCIUM 9.6 9.1   Liver Function Tests: Recent Labs    07/29/17 0725  AST 19  ALT 12  BILITOT 0.8  PROT 6.8   No results for input(s): LIPASE, AMYLASE in the last 8760 hours. No results for input(s): AMMONIA in the last 8760 hours. CBC: Recent Labs    07/29/17 0725  WBC 6.2  NEUTROABS 3,404  HGB 13.9  HCT 40.1  MCV 100.3*  PLT 190   Cardiac Enzymes: No results for input(s): CKTOTAL, CKMB, CKMBINDEX, TROPONINI in the last 8760 hours. BNP: Invalid input(s): POCBNP No results found for: HGBA1C Lab Results  Component Value Date   TSH 2.17 07/29/2017   No results found for: VITAMINB12 No results found for: FOLATE Lab Results  Component Value Date   FERRITIN 86 07/29/2017    Lipid Panel: Recent Labs    07/29/17 0725  CHOL 189  HDL 70  LDLCALC 102*  TRIG 76  CHOLHDL 2.7   No results found for: HGBA1C  Procedures since last visit: No results found.  Assessment/Plan  1. Macrocytic anemia - CBC (no diff); Future - TSH;  Future - Methylmalonic Acid, Serum; Future  2. Hyperlipidemia LDL goal <100 LDL 102. With her age, defer statin. Pt not interested in fish oil. Supportive care with diet. Limited exercise with her impaired mobility - CMP; Future - Lipid Panel; Future  3. CKD (chronic kidney disease) stage 3, GFR 30-59 ml/min (HCC) Avoid NSAIDs, encouraged hydration, check bmp - CMP; Future - CBC (no diff); Future - TSH; Future  4. Essential hypertension Continue lisinopril, metoprolol and hctz, check bmp  5. RLS (restless legs syndrome) C/w requip  6. Insomnia, unspecified type Start trazodone 50 mg qhs and taper her off alprazolam- 0.25 mg every other day for a week then every 3 days for 2 weeks and stop. Sleep hygiene encouraged  7. Osteoarthritis of multiple joints, unspecified osteoarthritis type Denies pain, declines PT referral to help with gait training and safe transfers  8. Obesity (BMI 30-39.9) Dietary counselling, exercise as tolerated limited with mobility  9. Hyperglycemia Monitor BMP, cut down on sweets  10. Bilateral impacted cerumen Debrox bid to both ears x 1 week followed by ear lavage  11. PVD Advised to take lasix 20 mg daily for now, check BMP, keep legs elevated as tolerated, cannot apply ted hose by self    Labs/tests ordered:   Lab Orders     CMP     Lipid Panel     CBC (no diff)     TSH     Methylmalonic Acid, Serum  Next appointment: 1 month for f/u on leg edema and sleep   Blanchie Serve, MD Internal Medicine [facility] [place], [place] [address] Cell Phone (Monday-Friday 8 am - 5 pm): [phone] On Call: [phone] and follow prompts after 5 pm and on weekends Office Phone: [phone] Office Fax: [phone]

## 2018-01-20 NOTE — Patient Instructions (Addendum)
  Take lasix 20 mg daily for now until next visit. I want you to be weaned off alprazolam given its side effects for you to be greater than the benefit. I have started a new sleep aid and script has been sent to your pharmacy.   Use debrox ear drop 5 drop to both ears twice a day to help soften your wax for a week and come see [place] to get your ears cleaned

## 2018-01-27 ENCOUNTER — Non-Acute Institutional Stay (INDEPENDENT_AMBULATORY_CARE_PROVIDER_SITE_OTHER): Payer: PPO | Admitting: Internal Medicine

## 2018-01-27 VITALS — BP 128/60 | HR 71 | Temp 97.6°F | Resp 16 | Ht 59.0 in | Wt 148.2 lb

## 2018-01-27 DIAGNOSIS — H6123 Impacted cerumen, bilateral: Secondary | ICD-10-CM

## 2018-01-27 NOTE — Progress Notes (Signed)
Patient was seen in the clinic to have wax cleaned from both ears

## 2018-01-27 NOTE — Patient Instructions (Addendum)
Patient was seen in the clinic to have wax cleaned from both ears.

## 2018-02-07 DIAGNOSIS — N183 Chronic kidney disease, stage 3 (moderate): Secondary | ICD-10-CM | POA: Diagnosis not present

## 2018-02-07 DIAGNOSIS — D539 Nutritional anemia, unspecified: Secondary | ICD-10-CM | POA: Diagnosis not present

## 2018-02-07 DIAGNOSIS — E785 Hyperlipidemia, unspecified: Secondary | ICD-10-CM | POA: Diagnosis not present

## 2018-02-13 ENCOUNTER — Non-Acute Institutional Stay: Payer: PPO | Admitting: Family

## 2018-02-13 ENCOUNTER — Other Ambulatory Visit: Payer: PPO

## 2018-02-13 ENCOUNTER — Encounter: Payer: Self-pay | Admitting: Family

## 2018-02-13 VITALS — BP 128/82 | HR 90 | Temp 98.3°F | Resp 16 | Ht 59.0 in | Wt 148.2 lb

## 2018-02-13 DIAGNOSIS — G4700 Insomnia, unspecified: Secondary | ICD-10-CM | POA: Diagnosis not present

## 2018-02-13 MED ORDER — TRAZODONE HCL 100 MG PO TABS
100.0000 mg | ORAL_TABLET | Freq: Every day | ORAL | 3 refills | Status: DC
Start: 2018-02-13 — End: 2018-03-05

## 2018-02-13 MED ORDER — MELATONIN 3 MG PO TABS: 3.0000 mg | ORAL_TABLET | Freq: Every day | ORAL | 0 refills | Status: DC

## 2018-02-13 NOTE — Patient Instructions (Signed)
Take Trazodone 100 mg tablet daily at bedtime for sleep.May take Melatonin 3 mg to 6 mg tablet at bedtime.Notify provider's office if still unable to sleep.   Insomnia Insomnia is a sleep disorder that makes it difficult to fall asleep or to stay asleep. Insomnia can cause tiredness (fatigue), low energy, difficulty concentrating, mood swings, and poor performance at work or school. There are three different ways to classify insomnia:  Difficulty falling asleep.  Difficulty staying asleep.  Waking up too early in the morning.  Any type of insomnia can be long-term (chronic) or short-term (acute). Both are common. Short-term insomnia usually lasts for three months or less. Chronic insomnia occurs at least three times a week for longer than three months. What are the causes? Insomnia may be caused by another condition, situation, or substance, such as:  Anxiety.  Certain medicines.  Gastroesophageal reflux disease (GERD) or other gastrointestinal conditions.  Asthma or other breathing conditions.  Restless legs syndrome, sleep apnea, or other sleep disorders.  Chronic pain.  Menopause. This may include hot flashes.  Stroke.  Abuse of alcohol, tobacco, or illegal drugs.  Depression.  Caffeine.  Neurological disorders, such as Alzheimer disease.  An overactive thyroid (hyperthyroidism).  The cause of insomnia may not be known. What increases the risk? Risk factors for insomnia include:  Gender. Women are more commonly affected than men.  Age. Insomnia is more common as you get older.  Stress. This may involve your professional or personal life.  Income. Insomnia is more common in people with lower income.  Lack of exercise.  Irregular work schedule or night shifts.  Traveling between different time zones.  What are the signs or symptoms? If you have insomnia, trouble falling asleep or trouble staying asleep is the main symptom. This may lead to other symptoms,  such as:  Feeling fatigued.  Feeling nervous about going to sleep.  Not feeling rested in the morning.  Having trouble concentrating.  Feeling irritable, anxious, or depressed.  How is this treated? Treatment for insomnia depends on the cause. If your insomnia is caused by an underlying condition, treatment will focus on addressing the condition. Treatment may also include:  Medicines to help you sleep.  Counseling or therapy.  Lifestyle adjustments.  Follow these instructions at home:  Take medicines only as directed by your health care provider.  Keep regular sleeping and waking hours. Avoid naps.  Keep a sleep diary to help you and your health care provider figure out what could be causing your insomnia. Include: ? When you sleep. ? When you wake up during the night. ? How well you sleep. ? How rested you feel the next day. ? Any side effects of medicines you are taking. ? What you eat and drink.  Make your bedroom a comfortable place where it is easy to fall asleep: ? Put up shades or special blackout curtains to block light from outside. ? Use a white noise machine to block noise. ? Keep the temperature cool.  Exercise regularly as directed by your health care provider. Avoid exercising right before bedtime.  Use relaxation techniques to manage stress. Ask your health care provider to suggest some techniques that may work well for you. These may include: ? Breathing exercises. ? Routines to release muscle tension. ? Visualizing peaceful scenes.  Cut back on alcohol, caffeinated beverages, and cigarettes, especially close to bedtime. These can disrupt your sleep.  Do not overeat or eat spicy foods right before bedtime. This can lead  to digestive discomfort that can make it hard for you to sleep.  Limit screen use before bedtime. This includes: ? Watching TV. ? Using your smartphone, tablet, and computer.  Stick to a routine. This can help you fall asleep  faster. Try to do a quiet activity, brush your teeth, and go to bed at the same time each night.  Get out of bed if you are still awake after 15 minutes of trying to sleep. Keep the lights down, but try reading or doing a quiet activity. When you feel sleepy, go back to bed.  Make sure that you drive carefully. Avoid driving if you feel very sleepy.  Keep all follow-up appointments as directed by your health care provider. This is important. Contact a health care provider if:  You are tired throughout the day or have trouble in your daily routine due to sleepiness.  You continue to have sleep problems or your sleep problems get worse. Get help right away if:  You have serious thoughts about hurting yourself or someone else. This information is not intended to replace advice given to you by your health care provider. Make sure you discuss any questions you have with your health care provider. Document Released: 09/14/2000 Document Revised: 02/17/2016 Document Reviewed: 06/18/2014 Elsevier Interactive Patient Education  [employer].

## 2018-02-13 NOTE — Progress Notes (Signed)
Location:  [place] of Service:  Clinic (12) Provider: Dinah Ngetich FNP-C  Blanchie Serve, MD  Patient Care Team: Blanchie Serve, MD as PCP - General (Internal Medicine)  Extended Emergency Contact Information Primary Emergency Contact: [facility] Address: [address]          [place] [address] [place] Phone: [phone] Relation: Son Secondary Emergency Contact: Satterly,Garth Address: [address]          [place], [place] [address] [place] Phone: [phone] Relation: Son  Goals of care: Advanced Directive information Advanced Directives 01/01/2018  Does Patient Have a Medical Advance Directive? Yes  Type of Paramedic of [place];Living will;Out of facility DNR (pink MOST or yellow form)  Does patient want to make changes to medical advance directive? No - Patient declined  Copy of [place] in Chart? Yes  Pre-existing out of facility DNR order (yellow form or pink MOST form) Yellow form placed in chart (order not valid for inpatient use)     Chief Complaint  Patient presents with  . Acute Visit    insomnia    HPI:  Pt is a 84 y.o. female seen today at [facility] clinic for an acute visit for evaluation of inability to sleep.she states was sleeping well when she took her trazodone along with Alprazolam but was recently seen by MD and Alprazolam was wean off. She would like to start back on her alprazolam since it helped with sleep.she does not sleep with TV on.Alprazolam adverse effects discussed due to advance age and high risk for falls.she states sleeps 6 hours.   Past Medical History:  Diagnosis Date  . Anemia   . Hypertension   . OA (osteoarthritis)   . Osteoporosis   . Renal cyst    Past Surgical History:  Procedure Laterality Date  . APPENDECTOMY    . BLADDER SUSPENSION    . CATARACT EXTRACTION    . CESAREAN SECTION    . REPLACEMENT  TOTAL KNEE  1995  . TONSILLECTOMY      No Known Allergies  Outpatient Encounter Medications as of 02/13/2018  Medication Sig  . ALPRAZolam (XANAX) 0.25 MG tablet Take one tablet every other day for 1 week, then one tablet every 3 days for 2 week, then stop  . Biotin 1000 MCG tablet Take 1,000 mcg by mouth daily.  . furosemide (LASIX) 20 MG tablet Take 1 tablet (20 mg total) by mouth daily.  . hydrochlorothiazide (MICROZIDE) 12.5 MG capsule Take 2 capsules (25 mg total) by mouth daily.  Marland Kitchen lisinopril (PRINIVIL,ZESTRIL) 5 MG tablet TAKE 1 TABLET BY MOUTH TWICE DAILY.  . metoprolol tartrate (LOPRESSOR) 50 MG tablet TAKE 1 TABLET BY MOUTH TWICE DAILY.  . multivitamin-lutein (OCUVITE-LUTEIN) CAPS capsule Take 1 capsule by mouth daily.  Marland Kitchen rOPINIRole (REQUIP) 1 MG tablet TAKE 1 TABLET EACH EVENING.  . traZODone (DESYREL) 100 MG tablet Take 1 tablet (100 mg total) by mouth at bedtime.  . [DISCONTINUED] traZODone (DESYREL) 50 MG tablet Take 1 tablet (50 mg total) by mouth at bedtime.  . Melatonin 3 MG TABS Take 1 tablet (3 mg total) by mouth at bedtime. May take additional 3 mg tablet if unable to sleep   No facility-administered encounter medications on file as of 02/13/2018.     Review of Systems  Constitutional: Negative for appetite change, chills, fatigue and fever.  HENT: Positive for hearing loss. Negative for  congestion, rhinorrhea, sinus pressure, sinus pain, sneezing and sore throat.   Eyes: Positive for visual disturbance. Negative for discharge, redness and itching.       Wears eye glasses  Respiratory: Negative for cough, chest tightness, shortness of breath and wheezing.   Cardiovascular: Positive for leg swelling. Negative for chest pain and palpitations.  Gastrointestinal: Negative for abdominal distention, abdominal pain, constipation, diarrhea, nausea and vomiting.  Musculoskeletal: Positive for gait problem.  Skin: Negative for color change, pallor and rash.  Neurological:  Negative for dizziness, light-headedness and headaches.  Hematological: Does not bruise/bleed easily.  Psychiatric/Behavioral: Positive for sleep disturbance. Negative for agitation and confusion. The patient is not nervous/anxious.     Immunization History  Administered Date(s) Administered  . Influenza, High Dose Seasonal PF 07/10/2017  . Influenza-Unspecified 07/10/2017  . Pneumococcal Conjugate-13 08/18/2014  . Pneumococcal Polysaccharide-23 07/26/2010   Pertinent  Health Maintenance Due  Topic Date Due  . DEXA SCAN  01/02/2019 (Originally 05/16/1989)  . INFLUENZA VACCINE  05/01/2018  . PNA vac Low Risk Adult  Completed   Fall Risk  01/01/2018 07/22/2017  Falls in the past year? No No    Vitals:   02/13/18 0958  BP: 128/82  Pulse: 90  Resp: 16  Temp: 98.3 F (36.8 C)  TempSrc: Oral  SpO2: 91%  Weight: 148 lb 3.2 oz (67.2 kg)  Height: 4\' 11"  (1.499 m)   Body mass index is 29.93 kg/m. Physical Exam  Constitutional: She is oriented to person, place, and time.  Elderly in no acute distress   HENT:  Head: Normocephalic.  Right Ear: External ear normal.  Left Ear: External ear normal.  Mouth/Throat: Oropharynx is clear and moist. No oropharyngeal exudate.  Eyes: Pupils are equal, round, and reactive to light. Conjunctivae and EOM are normal. Right eye exhibits no discharge. Left eye exhibits no discharge.  Eye glasses in place   Cardiovascular: Exam reveals no gallop and no friction rub.  Murmur heard. Pulmonary/Chest: Effort normal and breath sounds normal. No stridor. No respiratory distress. She has no wheezes. She has no rales.  Abdominal: Soft. Bowel sounds are normal. She exhibits no distension and no mass. There is no tenderness. There is no rebound and no guarding.  Musculoskeletal:  Unsteady gait use power wheelchair able to transfer from wheelchair.right knee/foot deformity.bilateral lower extremities trace edema.   Neurological: She is oriented to person,  place, and time. Gait abnormal.  Skin: Skin is warm and dry. No rash noted. No erythema. No pallor.  Psychiatric: She has a normal mood and affect. Her behavior is normal. Thought content normal.    Labs reviewed: Recent Labs    07/29/17 0725 10/31/17 0840  NA 135 136  K 4.1 3.8  CL 95* 96*  CO2 28 33*  GLUCOSE 103* 91  BUN 24 23  CREATININE 1.07* 0.91*  CALCIUM 9.6 9.1   Recent Labs    07/29/17 0725  AST 19  ALT 12  BILITOT 0.8  PROT 6.8   Recent Labs    07/29/17 0725  WBC 6.2  NEUTROABS 3,404  HGB 13.9  HCT 40.1  MCV 100.3*  PLT 190   Lab Results  Component Value Date   TSH 2.17 07/29/2017   No results found for: HGBA1C Lab Results  Component Value Date   CHOL 189 07/29/2017   HDL 70 07/29/2017   LDLCALC 102 (H) 07/29/2017   TRIG 76 07/29/2017   CHOLHDL 2.7 07/29/2017    Significant Diagnostic Results in last  30 days:  No results found.  Assessment/Plan  Insomnia  Requesting to restart her alprazolam for sleep along with trazodone.Adverse effects due to advance age and high risk for falls discussed with patient.she verbalized understanding.will increase trazodone to 100 mg tablet daily at bedtime.may take melatonin 3-6 mg tablet daily at bedtime for sleep. Notify provider's office if still unable to sleep.    Family/ staff Communication: Reviewed plan of care with patient. Labs/tests ordered: None   Dinah C Ngetich, NP

## 2018-02-17 LAB — LIPID PANEL
Cholesterol: 177 mg/dL (ref <200)
HDL: 65 mg/dL (ref >50)
LDL CHOLESTEROL (CALC): 97 mg/dL
Non-HDL Cholesterol (Calc): 112 mg/dL (calc) (ref <130)
TRIGLYCERIDES: 65 mg/dL (ref <150)
Total CHOL/HDL Ratio: 2.7 (calc) (ref <5.0)

## 2018-02-17 LAB — COMPREHENSIVE METABOLIC PANEL
AG RATIO: 1.3 (calc) (ref 1.0–2.5)
ALT: 11 U/L (ref 6–29)
AST: 19 U/L (ref 10–35)
Albumin: 3.8 g/dL (ref 3.6–5.1)
Alkaline phosphatase (APISO): 67 U/L (ref 33–130)
BUN / CREAT RATIO: 25 (calc) — AB (ref 6–22)
BUN: 29 mg/dL — ABNORMAL HIGH (ref 7–25)
CHLORIDE: 96 mmol/L — AB (ref 98–110)
CO2: 30 mmol/L (ref 20–32)
Calcium: 9.4 mg/dL (ref 8.6–10.4)
Creat: 1.17 mg/dL — ABNORMAL HIGH (ref 0.60–0.88)
GLUCOSE: 94 mg/dL (ref 65–99)
Globulin: 2.9 g/dL (calc) (ref 1.9–3.7)
POTASSIUM: 4 mmol/L (ref 3.5–5.3)
SODIUM: 137 mmol/L (ref 135–146)
TOTAL PROTEIN: 6.7 g/dL (ref 6.1–8.1)
Total Bilirubin: 0.6 mg/dL (ref 0.2–1.2)

## 2018-02-17 LAB — CBC
HEMATOCRIT: 39.7 % (ref 35.0–45.0)
HEMOGLOBIN: 13.7 g/dL (ref 11.7–15.5)
MCH: 34.2 pg — AB (ref 27.0–33.0)
MCHC: 34.5 g/dL (ref 32.0–36.0)
MCV: 99 fL (ref 80.0–100.0)
MPV: 10.7 fL (ref 7.5–12.5)
PLATELETS: 172 10*3/uL (ref 140–400)
RBC: 4.01 10*6/uL (ref 3.80–5.10)
RDW: 13.4 % (ref 11.0–15.0)
WBC: 5.7 10*3/uL (ref 3.8–10.8)

## 2018-02-17 LAB — METHYLMALONIC ACID, SERUM: Methylmalonic Acid, Quant: 357 nmol/L — ABNORMAL HIGH (ref 87–318)

## 2018-02-17 LAB — TSH: TSH: 1.81 mIU/L (ref 0.40–4.50)

## 2018-03-05 ENCOUNTER — Encounter: Payer: Self-pay | Admitting: Internal Medicine

## 2018-03-05 ENCOUNTER — Non-Acute Institutional Stay: Payer: PPO | Admitting: Internal Medicine

## 2018-03-05 ENCOUNTER — Telehealth: Payer: Self-pay

## 2018-03-05 VITALS — BP 126/70 | HR 90 | Temp 97.4°F | Resp 16 | Ht 59.0 in | Wt 147.8 lb

## 2018-03-05 DIAGNOSIS — I10 Essential (primary) hypertension: Secondary | ICD-10-CM | POA: Diagnosis not present

## 2018-03-05 DIAGNOSIS — I739 Peripheral vascular disease, unspecified: Secondary | ICD-10-CM | POA: Diagnosis not present

## 2018-03-05 DIAGNOSIS — N183 Chronic kidney disease, stage 3 unspecified: Secondary | ICD-10-CM

## 2018-03-05 DIAGNOSIS — E785 Hyperlipidemia, unspecified: Secondary | ICD-10-CM

## 2018-03-05 DIAGNOSIS — G4700 Insomnia, unspecified: Secondary | ICD-10-CM | POA: Diagnosis not present

## 2018-03-05 MED ORDER — ALPRAZOLAM 0.25 MG PO TABS: 0.2500 mg | ORAL_TABLET | Freq: Every evening | ORAL | 0 refills | Status: DC | PRN

## 2018-03-05 MED ORDER — TRAZODONE HCL 100 MG PO TABS: ORAL_TABLET | ORAL | 3 refills | Status: DC

## 2018-03-05 NOTE — Telephone Encounter (Signed)
Incoming call received from [facility] Pharmacist:  RX sent today for trazodone with the indication for patient to wean off medication, yet 3 additional refills were added to rx. The pharmacist would like to confirm that additional refills were sent in error.  Based on instructions and OV notes I advised pharmacist to disregard additional refills   Message will be sent to Dr.Pandey and her medical assistant as a Juluis Rainier

## 2018-03-05 NOTE — Progress Notes (Signed)
[facility]  Provider: Blanchie Serve MD   Location:  [place] of Service:  Clinic (12)  PCP: Blanchie Serve, MD Patient Care Team: Blanchie Serve, MD as PCP - General (Internal Medicine)  Extended Emergency Contact Information Primary Emergency Contact: [facility] Address: [address]          [place] [address] [place] Phone: [phone] Relation: Son Secondary Emergency Contact: Sky,Garth Address: [address]          [place], [place] [address] [place] Phone: [phone] Relation: Son  Goals of Care: Advanced Directive information Advanced Directives 01/01/2018  Does Patient Have a Medical Advance Directive? Yes  Type of Paramedic of [place];Living will;Out of facility DNR (pink MOST or yellow form)  Does patient want to make changes to medical advance directive? No - Patient declined  Copy of [place] in Chart? Yes  Pre-existing out of facility DNR order (yellow form or pink MOST form) Yellow form placed in chart (order not valid for inpatient use)      Chief Complaint  Patient presents with  . Acute Visit    follow up on insomnia and leg edema, lab discussion  . Medication Refill    No refills needed at this time.     HPI: Patient is a 84 y.o. female seen today for follow up on her acute concerns form last visit. She has been tried on trazodone with increased dosing and melatonin for her sleep and it is not helping her. She is still taking her remaining alprazolam and this helps with her sleep and she would like this resumed. Her son had called yesterday. He is a physician and understands risks associated with benzodiazepine in elderly. He mentions that patient has been on alprazolam for several years. She has been taking furosemide and tolerating it well. She mentions that the swelling to her legs have subsided. BP reading stable on  review. Has increased urinary frequency. RLS symptom controlled with requip.   Past Medical History:  Diagnosis Date  . Anemia   . Hypertension   . OA (osteoarthritis)   . Osteoporosis   . Renal cyst    Past Surgical History:  Procedure Laterality Date  . APPENDECTOMY    . BLADDER SUSPENSION    . CATARACT EXTRACTION    . CESAREAN SECTION    . REPLACEMENT TOTAL KNEE  1995  . TONSILLECTOMY      reports that she has quit smoking. Her smoking use included e-cigarettes. She has never used smokeless tobacco. She reports that she does not drink alcohol or use drugs. Social History   Socioeconomic History  . Marital status: Widowed    Spouse name: Not on file  . Number of children: 2  . Years of education: 3  . Highest education level: Not on file  Occupational History  . Occupation: Retired Ship broker  . Financial resource strain: Not hard at all  . Food insecurity:    Worry: Never true    Inability: Never true  . Transportation needs:    Medical: No    Non-medical: No  Tobacco Use  . Smoking status: Former Smoker    Types: E-cigarettes  . Smokeless tobacco: Never Used  . Tobacco comment: when she was 84 years old  Substance and Sexual Activity  . Alcohol use: No  . Drug use: No  . Sexual activity: Never  Lifestyle  . Physical activity:    Days per week: 0 days    Minutes per session: 0 min  . Stress: Not at all  Relationships  . Social connections:    Talks on phone: More than three times a week    Gets together: More than three times a week    Attends religious service: Never    Active member of club or organization: No    Attends meetings of clubs or organizations: Never    Relationship status: Widowed  . Intimate partner violence:    Fear of current or ex partner: No    Emotionally abused: No    Physically abused: No    Forced sexual activity: No  Other Topics Concern  . Not on file  Social History Narrative   Coffee-with Caffeine   Widow    2 sons   No pets   Retired Therapist, sports   No exercise   HCPOA, Living Will, No DNR   + glasses   + hearing aids    Functional Status Survey:    Family History  Problem Relation Age of Onset  . Colon cancer Mother   . Arthritis Mother   . Heart disease Father   . Heart attack Father   . Migraines Sister   . Allergies Sister   . Asthma Sister     Health Maintenance  Topic Date Due  . Samul Dada  05/17/1941  . DEXA SCAN  01/02/2019 (Originally 05/16/1989)  . INFLUENZA VACCINE  05/01/2018  . PNA vac Low Risk Adult  Completed    No Known Allergies  Outpatient Encounter Medications as of 03/05/2018  Medication Sig  . Biotin 1000 MCG tablet Take 1,000 mcg by mouth daily.  . furosemide (LASIX) 20 MG tablet Take 1 tablet (20 mg total) by mouth daily.  . hydrochlorothiazide (MICROZIDE) 12.5 MG capsule Take 2 capsules (25 mg total) by mouth daily.  Marland Kitchen lisinopril (PRINIVIL,ZESTRIL) 5 MG tablet TAKE 1 TABLET BY MOUTH TWICE DAILY.  . metoprolol tartrate (LOPRESSOR) 50 MG tablet TAKE 1 TABLET BY MOUTH TWICE DAILY.  . multivitamin-lutein (OCUVITE-LUTEIN) CAPS capsule Take 1 capsule by mouth daily.  Marland Kitchen rOPINIRole (REQUIP) 1 MG tablet TAKE 1 TABLET EACH EVENING.  . traZODone (DESYREL) 100 MG tablet Take half a tablet = 50 mg daily for 1 week, then half a tablet every other day for 1 week and stop  . [DISCONTINUED] traZODone (DESYREL) 100 MG tablet Take 1 tablet (100 mg total) by mouth at bedtime.  . ALPRAZolam (XANAX) 0.25 MG tablet Take 1 tablet (0.25 mg total) by mouth at bedtime as needed for anxiety or sleep.  . [DISCONTINUED] ALPRAZolam (XANAX) 0.25 MG tablet Take one tablet every other day for 1 week, then one tablet every 3 days for 2 week, then stop (Patient not taking: Reported on 03/05/2018)  . [DISCONTINUED] Melatonin 3 MG TABS Take 1 tablet (3 mg total) by mouth at bedtime. May take additional 3 mg tablet if unable to sleep (Patient not taking: Reported on 03/05/2018)   No  facility-administered encounter medications on file as of 03/05/2018.     Review of Systems  Constitutional: Negative for appetite change, fatigue and fever.  HENT: Positive for rhinorrhea. Negative for congestion, sinus pressure and trouble swallowing.        Clear nasal discharge  Eyes: Positive for visual disturbance. Negative for discharge and itching.       Wears corrective glasses, sees eye doctor once a year, s/p cataract surgery  Respiratory: Negative for cough and shortness of breath.   Cardiovascular: Positive for leg swelling. Negative for chest pain and palpitations.  Gastrointestinal: Negative for abdominal pain, diarrhea, nausea and vomiting.  Genitourinary: Negative for dysuria, flank pain, hematuria, pelvic pain, urgency and vaginal discharge.       Gets up once at night to urinate, denies incontinence  Musculoskeletal: Positive for gait problem. Negative for arthralgias and back pain.       No fall reported, uses wheelchair for ambulation  Skin: Negative for wound.  Neurological: Negative for dizziness, weakness, light-headedness and headaches.       Has RLS and takes requip  Hematological: Bruises/bleeds easily.  Psychiatric/Behavioral: Positive for sleep disturbance. Negative for behavioral problems, confusion, decreased concentration and suicidal ideas. The patient is not nervous/anxious.     Vitals:   03/05/18 0857  BP: 126/70  Pulse: 90  Resp: 16  Temp: (!) 97.4 F (36.3 C)  TempSrc: Oral  SpO2: 92%  Weight: 147 lb 12.8 oz (67 kg)  Height: 4' 11" (1.499 m)   Body mass index is 29.85 kg/m.   Wt Readings from Last 3 Encounters:  03/05/18 147 lb 12.8 oz (67 kg)  02/13/18 148 lb 3.2 oz (67.2 kg)  01/27/18 148 lb 3.2 oz (67.2 kg)   Physical Exam  Constitutional: She is oriented to person, place, and time. No distress.  Obese elderly female  HENT:  Head: Normocephalic and atraumatic.  Mouth/Throat: Oropharynx is clear and moist.  Cerumen to both ears    Eyes: Conjunctivae and EOM are normal. Right eye exhibits no discharge. Left eye exhibits no discharge.  Neck: Normal range of motion. Neck supple.  Cardiovascular: Normal rate, regular rhythm and intact distal pulses.  Murmur heard. Pulmonary/Chest: Effort normal and breath sounds normal. No respiratory distress. She has no wheezes. She has no rales.  Abdominal: Soft. Bowel sounds are normal. There is no tenderness.  Musculoskeletal: She exhibits edema and deformity.  Wheelchair for ambulation, right knee gives away, swelling to both legs, deformity of right foot  Lymphadenopathy:    She has no cervical adenopathy.  Neurological: She is alert and oriented to person, place, and time.  Skin: Skin is warm and dry. She is not diaphoretic.  Psychiatric: She has a normal mood and affect. Her behavior is normal.    Labs reviewed: Basic Metabolic Panel: Recent Labs    07/29/17 0725 10/31/17 0840 02/07/18 0000  NA 135 136 137  K 4.1 3.8 4.0  CL 95* 96* 96*  CO2 28 33* 30  GLUCOSE 103* 91 94  BUN 24 23 29*  CREATININE 1.07* 0.91* 1.17*  CALCIUM 9.6 9.1 9.4   Liver Function Tests: Recent Labs    07/29/17 0725 02/07/18 0000  AST 19 19  ALT 12 11  BILITOT 0.8 0.6  PROT 6.8 6.7   No results for input(s): LIPASE, AMYLASE in the last 8760 hours. No results for input(s): AMMONIA in the last 8760 hours. CBC: Recent Labs    07/29/17 0725 02/07/18 0000  WBC 6.2 5.7  NEUTROABS 3,404  --   HGB 13.9 13.7  HCT 40.1 39.7  MCV 100.3* 99.0  PLT 190 172   Cardiac Enzymes: No results for input(s): CKTOTAL, CKMB, CKMBINDEX, TROPONINI in the last 8760 hours. BNP: Invalid input(s): POCBNP No results found for: HGBA1C Lab Results  Component Value Date   TSH 1.81 02/07/2018   No results found for: VITAMINB12 No results found for: FOLATE Lab Results  Component Value Date  FERRITIN 86 07/29/2017    Lipid Panel: Recent Labs    07/29/17 0725 02/07/18 0000  CHOL 189 177  HDL  70 65  LDLCALC 102* 97  TRIG 76 65  CHOLHDL 2.7 2.7   No results found for: HGBA1C  Procedures since last visit: No results found.  Assessment/Plan  1. Essential hypertension Continue hctz with lisinopril and metoprolol tartrate for now  2. PVD (peripheral vascular disease) (HCC) Improved leg edema, compression stocking encourage. Keep legs elevated at rest. Continue lasix and hctz, if edema stable, consider decreasing dosing of hctz  3. CKD (chronic kidney disease) stage 3, GFR 30-59 ml/min (HCC) Encouraged hydration, avoid NSAIDs, stable BP, monitor - CMP with eGFR([facility]); Future - CBC (no diff); Future  4. Hyperlipidemia LDL goal <100 Reviewed lipid panel, controlled. Monitor periodically.   5. Insomnia, unspecified type Start alrpazolam 0.25 mg qhs prn- 30 pill script with 0 refill provided. D/c melatonin. Wean her off trazodone to 50 mg daily for a week, then 50 mg qod for a week and stop. Sleep hygiene encouraged. Patient is able to repeat side effect associated with alprazolam including drowsiness, increased fall risk, clouding of thinking among others but would like to take this medication to help provide quality of life by helping her rest better at night.    Labs/tests ordered:    Lab Orders     CMP with eGFR([facility])     CBC (no diff)  Next appointment: 4 months or earlier if needed   [facility], MD Internal Medicine [place], [place] [address] Cell Phone (Monday-Friday 8 am - 5 pm): [phone] On Call: [phone] and follow prompts after 5 pm and on weekends Office Phone: [phone] Office Fax: [phone]

## 2018-03-05 NOTE — Patient Instructions (Signed)
  We will wean you off your trazodone, take half a tablet = 50 mg daily for 1 week and then half a tablet every other day for 1 week and stop.  I would encourage you to wear compression stockings to help with swelling to your legs.

## 2018-03-05 NOTE — Telephone Encounter (Signed)
Noted. Thanks.

## 2018-03-14 ENCOUNTER — Telehealth: Payer: Self-pay | Admitting: *Deleted

## 2018-03-14 NOTE — Telephone Encounter (Signed)
Patient advised [place] Liaison that she would like to be DNR. Form given to Dr. Bubba Camp for completion. Copy made for chart and original given back to care liaison.

## 2018-03-24 ENCOUNTER — Other Ambulatory Visit: Payer: Self-pay | Admitting: Internal Medicine

## 2018-04-02 ENCOUNTER — Other Ambulatory Visit: Payer: Self-pay | Admitting: *Deleted

## 2018-04-02 ENCOUNTER — Other Ambulatory Visit: Payer: Self-pay | Admitting: Internal Medicine

## 2018-04-02 MED ORDER — ALPRAZOLAM 0.25 MG PO TABS: 0.2500 mg | ORAL_TABLET | Freq: Every evening | ORAL | 0 refills | Status: DC | PRN

## 2018-04-02 NOTE — Telephone Encounter (Signed)
Ok to refill? Per Oliver last filled 03/05/18 #30 at [facility]. Patient wants this sent in today due to upcoming holiday.

## 2018-04-07 ENCOUNTER — Other Ambulatory Visit: Payer: Self-pay | Admitting: Internal Medicine

## 2018-05-05 ENCOUNTER — Other Ambulatory Visit: Payer: Self-pay | Admitting: Internal Medicine

## 2018-05-05 NOTE — Telephone Encounter (Signed)
A medication refill was received from pharmacy for alprazolam 0.25 mg. Rx was called in to pharmacy after verifying last fill date, provider, and quantity on [place]MP [place] database.   

## 2018-05-05 NOTE — Telephone Encounter (Signed)
[place] Database Verified Last filled 04/02/18

## 2018-05-06 ENCOUNTER — Other Ambulatory Visit: Payer: Self-pay | Admitting: *Deleted

## 2018-05-06 NOTE — Patient Outreach (Signed)
[facility]) Care Management  05/06/2018  Ann Jensen Apr 25, 1922 [id number]   Coverage for [place] Telephone Screen  Referral Date: 05/06/18 Referral Source: Episource referral Referral Reason: "has transportation but would like to arise SCAT bus" Insurance: Health team advantage (HTA)  Outreach attempt # 1  No answer. [facility] RN CM left HIPAA compliant voicemail message along with CM's contact info  Conditions: bilateral hearing impairment, positive for fall risk, PV, HTN, Perpheral edema, Muscle weakness unspecified, tremor, overweight, gait instability, restless leg syndrome, dry eyes, allergies, insomnia  Plan: [facility] RN CM sent an unsuccessful outreach letter and scheduled this patient for another call attempt within 4 business days  [facility] RN CM will updated [place] via [employer] L. Lavina Hamman, RN, BSN, CCM [facility] Telephonic Care Management Care Coordinator Direct number [phone]  Main [facility] number [phone] Fax number [phone]

## 2018-05-06 NOTE — Patient Outreach (Signed)
[facility]) Care Management  05/06/2018  Ann Jensen May 04, 1922 [id number]   Coverage for Ann Jensen Telephone Screen  Referral Date: 05/06/18 Referral Source: Episource referral Referral Reason: "has transportation but would like to arise SCAT bus" Insurance: Health team advantage (HTA)  Patient is able to verify HIPAA Reviewed and addressed referral to [facility] with patient She confirms she was seen by a NP recently and after the examination voiced interest in SCAT transportation services as alternative transportation to medical appointments She is interested in the SCAT schedule and scooter access   Social: Ann Jensen lives at [facility] in [place] and is independent with her care She gives her own medications but has assist with recreation and food from the facility Her two sons, Ann Jensen and Ann Jensen, live locally  She does not walk but gets around using a scooter  Medications: She denies concerns with taking medications as prescribed, affording medications, side effects of medications and questions about medications  Appointments: She reports she is seen yearly by Dr Bubba Camp and was seen about 2-3 months ago  Advance Directives: She reports having a living will and one of her sons is POA She denies need for assist with or assist with changes for advance directives   Consent: [facility] RN CM reviewed [facility] services with patient. Patient gave verbal consent for services for [facility] for SCAT resources   Conditions: bilateral hearing impairment, positive for fall risk, PV, HTN, Peripheral edema, Muscle weakness unspecified, tremor, overweight, gait instability, restless leg syndrome, dry eyes, allergies, insomnia  Plan: [facility] RN CM referred Ann Jensen to [facility] SW for SCAT transportation resources  [facility] RN CM will updated Ann Jensen via [employer] L. Lavina Hamman, RN, BSN, CCM [facility] Telephonic Care Management Care Coordinator Direct number [phone]  Main [facility]  number [phone] Fax number [phone]

## 2018-05-06 NOTE — Addendum Note (Signed)
Addended by: Barbaraann Faster on: 05/06/2018 03:25 PM   Modules accepted: Orders

## 2018-05-08 ENCOUNTER — Other Ambulatory Visit: Payer: Self-pay

## 2018-05-08 NOTE — Patient Outreach (Signed)
[facility]) Care Management  05/08/2018  Ann Jensen 01/31/1922 [id number]  BSW attempted to contact the patient regarding recent referral for transportation assistance. Unfortunately the patient did not answer. BSW left a  HIPAA compliant voice message requesting a return call.  Plan: BSW will send patient an unsuccessful outreach letter requesting contact. BSW will attempt patient again within the next four business days.  Daneen Schick, BSW, CDP [facility] [phone]

## 2018-05-09 NOTE — Telephone Encounter (Signed)
This encounter was created in error - please disregard.

## 2018-05-12 ENCOUNTER — Other Ambulatory Visit: Payer: Self-pay | Admitting: Internal Medicine

## 2018-05-13 ENCOUNTER — Other Ambulatory Visit: Payer: Self-pay

## 2018-05-13 NOTE — Patient Outreach (Signed)
[facility]) Care Management  05/13/2018  ENES WEGENER January 17, 1922 [id number]  Successful outreach to the patient on today's date, HIPAA identifiers confirmed. BSW introduced self to the patient and the reason for today's call. BSW discussed the patients recent referral to social work for assistance with a SCAT application. The patient confirms she would like a SCAT application but "does not have time to discuss today". The patient requested BSW mail the application to her address for completion. The patient understands Part B must be completed by a physician or licensed worker. The patient states she lives at [facility] and "they have all that".  Plan: BSW to mail a SCAT application on today's date. BSW to contact the patient in the next two weeks to confirm receipt of mailings.  Daneen Schick, BSW, CDP [facility] [phone]

## 2018-05-14 ENCOUNTER — Encounter: Payer: Self-pay | Admitting: Internal Medicine

## 2018-05-21 ENCOUNTER — Non-Acute Institutional Stay: Payer: PPO | Admitting: Internal Medicine

## 2018-05-21 ENCOUNTER — Encounter: Payer: Self-pay | Admitting: Internal Medicine

## 2018-05-21 VITALS — BP 126/72 | HR 75 | Temp 98.0°F | Resp 18 | Ht 59.0 in | Wt 147.0 lb

## 2018-05-21 DIAGNOSIS — H6121 Impacted cerumen, right ear: Secondary | ICD-10-CM | POA: Diagnosis not present

## 2018-05-21 DIAGNOSIS — J309 Allergic rhinitis, unspecified: Secondary | ICD-10-CM

## 2018-05-21 MED ORDER — LORATADINE 10 MG PO TABS: 10.0000 mg | ORAL_TABLET | Freq: Every day | ORAL | 11 refills | Status: DC

## 2018-05-21 MED ORDER — FLUTICASONE PROPIONATE 50 MCG/ACT NA SUSP: 1.0000 | Freq: Every day | NASAL | 2 refills | Status: DC

## 2018-05-21 NOTE — Progress Notes (Signed)
[facility]  Provider: Blanchie Serve MD   Location:  [facility]   Place of Service:  Clinic (12)  PCP: Blanchie Serve, MD Patient Care Team: Blanchie Serve, MD as PCP - General (Internal Medicine) Daneen Schick, [place] as [place] Management  Extended Emergency Contact Information Primary Emergency Contact: [facility] Address: [address]          [place] [address] [place] Phone: [phone] Relation: Son Secondary Emergency Contact: Penman,Garth Address: [address]          [place], [place] [address] [place] Phone: [phone] Relation: Son  Code Status: DNR  Goals of Care: Advanced Directive information Advanced Directives 05/21/2018  Does Patient Have a Medical Advance Directive? Yes  Type of Advance Directive Out of facility DNR (pink MOST or yellow form)  Does patient want to make changes to medical advance directive? No - Patient declined  Copy of [place] in Chart? -  Pre-existing out of facility DNR order (yellow form or pink MOST form) Yellow form placed in chart (order not valid for inpatient use)     Chief Complaint  Patient presents with  . Acute Visit    ear stuffiness, jaw discomfort    HPI: Patient is a 84 y.o. female seen today for acute visit. She has been having pain to right ear for 7-10 days. While talking to people, she has noticed echoing of her voice in her head. The pain is like slight discomfort that has now resolved. She has runny nose and had noticed discomfort to her right jaw and mouth few days back. That has now resolved as well but she feels to have the echo of her voice going on.    Past Medical History:  Diagnosis Date  . Anemia   . Hypertension   . OA (osteoarthritis)   . Osteoporosis   . Renal cyst    Past Surgical History:  Procedure Laterality Date  . APPENDECTOMY    . BLADDER SUSPENSION    . CATARACT  EXTRACTION    . CESAREAN SECTION    . REPLACEMENT TOTAL KNEE  1995  . TONSILLECTOMY      reports that she has quit smoking. Her smoking use included e-cigarettes. She has never used smokeless tobacco. She reports that she does not drink alcohol or use drugs. Social History   Socioeconomic History  . Marital status: Widowed    Spouse name: Not on file  . Number of children: 2  . Years of education: 3  . Highest education level: Not on file  Occupational History  . Occupation: Retired Ship broker  . Financial resource strain: Not hard at all  . Food insecurity:    Worry: Never true    Inability: Never true  . Transportation needs:    Medical: No    Non-medical: No  Tobacco Use  . Smoking status: Former Smoker    Types: E-cigarettes  . Smokeless tobacco: Never Used  . Tobacco comment: when she was 84 years old  Substance and Sexual Activity  . Alcohol use: No  . Drug use: No  . Sexual activity: Never  Lifestyle  . Physical activity:    Days per week: 0 days    Minutes per session: 0 min  . Stress: Not at all  Relationships  . Social connections:    Talks on phone: More than three times a week  Gets together: More than three times a week    Attends religious service: Never    Active member of club or organization: No    Attends meetings of clubs or organizations: Never    Relationship status: Widowed  . Intimate partner violence:    Fear of current or ex partner: No    Emotionally abused: No    Physically abused: No    Forced sexual activity: No  Other Topics Concern  . Not on file  Social History Narrative   Coffee-with Caffeine   Widow   2 sons   No pets   Retired Therapist, sports   No exercise   HCPOA, Living Will, No DNR   + glasses   + hearing aids     Family History  Problem Relation Age of Onset  . Colon cancer Mother   . Arthritis Mother   . Heart disease Father   . Heart attack Father   . Migraines Sister   . Allergies Sister   . Asthma  Sister     Health Maintenance  Topic Date Due  . Samul Dada  05/17/1941  . INFLUENZA VACCINE  05/01/2018  . DEXA SCAN  01/02/2019 (Originally 05/16/1989)  . PNA vac Low Risk Adult  Completed    No Known Allergies  Outpatient Encounter Medications as of 05/21/2018  Medication Sig  . ALPRAZolam (XANAX) 0.25 MG tablet TAKE 1 TABLET AT BEDTIME AS NEEDED FOR ANXIETY OR SLEEP  . Biotin 1000 MCG tablet Take 1,000 mcg by mouth daily.  . furosemide (LASIX) 20 MG tablet Take 1 tablet (20 mg total) by mouth daily.  . hydrochlorothiazide (MICROZIDE) 12.5 MG capsule TAKE (2) CAPSULES DAILY.  Marland Kitchen lisinopril (PRINIVIL,ZESTRIL) 5 MG tablet TAKE 1 TABLET BY MOUTH TWICE DAILY.  . metoprolol tartrate (LOPRESSOR) 50 MG tablet TAKE 1 TABLET BY MOUTH TWICE DAILY.  . multivitamin-lutein (OCUVITE-LUTEIN) CAPS capsule Take 1 capsule by mouth daily.  Marland Kitchen rOPINIRole (REQUIP) 1 MG tablet TAKE 1 TABLET EACH EVENING.  . fluticasone (FLONASE) 50 MCG/ACT nasal spray Place 1 spray into both nostrils daily.  Marland Kitchen loratadine (CLARITIN) 10 MG tablet Take 1 tablet (10 mg total) by mouth daily.  . [DISCONTINUED] traZODone (DESYREL) 100 MG tablet Take half a tablet = 50 mg daily for 1 week, then half a tablet every other day for 1 week and stop (Patient not taking: Reported on 05/21/2018)   No facility-administered encounter medications on file as of 05/21/2018.     Review of Systems  Constitutional: Negative for appetite change, chills and fever.  HENT: Positive for hearing loss, rhinorrhea, sneezing and sore throat. Negative for congestion, dental problem, ear discharge, ear pain, facial swelling, mouth sores, nosebleeds, postnasal drip, sinus pressure, sinus pain, tinnitus, trouble swallowing and voice change.        Right side of her face and jaw has some discomfort  Eyes: Negative for pain, discharge and itching.       Noticed some redness and some crust to left eye once earlier this week  Respiratory: Negative for cough,  shortness of breath and wheezing.   Cardiovascular: Negative for chest pain and palpitations.  Gastrointestinal: Negative for nausea and vomiting.  Musculoskeletal: Positive for gait problem.  Neurological: Negative for dizziness, light-headedness, numbness and headaches.  Psychiatric/Behavioral: Negative for confusion.    Vitals:   05/21/18 0928  BP: 126/72  Pulse: 75  Resp: 18  Temp: 98 F (36.7 C)  TempSrc: Oral  SpO2: 94%  Weight: 147 lb (66.7 kg)  Height: 4\' 11"  (1.499 m)   Body mass index is 29.69 kg/m. Physical Exam  Constitutional: She is oriented to person, place, and time. She appears well-developed and well-nourished. No distress.  HENT:  Head: Normocephalic and atraumatic.  Left Ear: External ear normal.  Nose: Nose normal.  Mouth/Throat: Oropharynx is clear and moist. No oropharyngeal exudate.  Cerumen to right ear, hearing aids, no sinus tenderness, no temporal tenderness, able to open and close her mouth without any difficulty  Eyes: Pupils are equal, round, and reactive to light. Conjunctivae and EOM are normal. Right eye exhibits no discharge. Left eye exhibits no discharge.  Neck: Normal range of motion. Neck supple.  Cardiovascular: Normal rate and regular rhythm.  Murmur heard. Pulmonary/Chest: Effort normal and breath sounds normal. No respiratory distress. She has no wheezes. She has no rales.  Abdominal: Soft. Bowel sounds are normal. There is no tenderness. There is no guarding.  Lymphadenopathy:    She has no cervical adenopathy.  Neurological: She is alert and oriented to person, place, and time.  Skin: Skin is warm and dry. She is not diaphoretic.  Psychiatric: She has a normal mood and affect.    Labs reviewed: Basic Metabolic Panel: Recent Labs    07/29/17 0725 10/31/17 0840 02/07/18 0000  NA 135 136 137  K 4.1 3.8 4.0  CL 95* 96* 96*  CO2 28 33* 30  GLUCOSE 103* 91 94  BUN 24 23 29*  CREATININE 1.07* 0.91* 1.17*  CALCIUM 9.6 9.1  9.4   Liver Function Tests: Recent Labs    07/29/17 0725 02/07/18 0000  AST 19 19  ALT 12 11  BILITOT 0.8 0.6  PROT 6.8 6.7   No results for input(s): LIPASE, AMYLASE in the last 8760 hours. No results for input(s): AMMONIA in the last 8760 hours. CBC: Recent Labs    07/29/17 0725 02/07/18 0000  WBC 6.2 5.7  NEUTROABS 3,404  --   HGB 13.9 13.7  HCT 40.1 39.7  MCV 100.3* 99.0  PLT 190 172   Cardiac Enzymes: No results for input(s): CKTOTAL, CKMB, CKMBINDEX, TROPONINI in the last 8760 hours. BNP: Invalid input(s): POCBNP No results found for: HGBA1C Lab Results  Component Value Date   TSH 1.81 02/07/2018   No results found for: VITAMINB12 No results found for: FOLATE Lab Results  Component Value Date   FERRITIN 86 07/29/2017    Lipid Panel: Recent Labs    07/29/17 0725 02/07/18 0000  CHOL 189 177  HDL 70 65  LDLCALC 102* 97  TRIG 76 65  CHOLHDL 2.7 2.7   No results found for: HGBA1C  Procedures since last visit: No results found.  Assessment/Plan  1. Allergic rhinitis, unspecified seasonality, unspecified trigger - fluticasone (FLONASE) 50 MCG/ACT nasal spray; Place 1 spray into both nostrils daily.  Dispense: 16 g; Refill: 2 - loratadine (CLARITIN) 10 MG tablet; Take 1 tablet (10 mg total) by mouth daily.  Dispense: 30 tablet; Refill: 11  2. Right ear impacted cerumen Debrox ear drop bid to right ear. Ear lavage on Monday 05/26/18    Labs/tests ordered:  None  Next appointment: has f/u appt  Communication: reviewed care plan with patient     Blanchie Serve, MD Internal Medicine [place], [place] [address] Cell Phone (Monday-Friday 8 am - 5 pm): [phone] On Call: [phone] and follow prompts after 5 pm and on weekends Office Phone: [phone] Office Fax: [phone]

## 2018-05-26 ENCOUNTER — Ambulatory Visit: Payer: PPO | Admitting: *Deleted

## 2018-05-26 DIAGNOSIS — H6121 Impacted cerumen, right ear: Secondary | ICD-10-CM | POA: Diagnosis not present

## 2018-05-26 NOTE — Progress Notes (Signed)
Irrigated right ear x2 with no success. Patient says she has been using debrox as instructed. Advised I would let Dr. Bubba Camp know and she could refer to ENT for removal. Patient declined and said she wasn't going to ENT at this time, but would let me know if she changed her mind.

## 2018-05-27 ENCOUNTER — Other Ambulatory Visit: Payer: Self-pay

## 2018-05-27 ENCOUNTER — Ambulatory Visit: Payer: Self-pay

## 2018-05-27 DIAGNOSIS — N183 Chronic kidney disease, stage 3 unspecified: Secondary | ICD-10-CM

## 2018-05-27 NOTE — Patient Outreach (Signed)
[facility]) Care Management  05/27/2018  Ann Jensen July 10, 1922 [id number]  BSW attempted to contact the patient on today's date to confirm receipt of a SCAT application. Unfortunately, the patient did not answer. The patients phone continuously rang without prompting BSW to leave a voice message. BSW will plan a final outreach within the next four business days.  Daneen Schick, BSW, CDP [facility] [phone]

## 2018-06-03 ENCOUNTER — Ambulatory Visit: Payer: Self-pay

## 2018-06-03 ENCOUNTER — Other Ambulatory Visit: Payer: Self-pay

## 2018-06-03 NOTE — Patient Outreach (Signed)
[facility]) Care Management  06/03/2018  Ann Jensen [birth year] [id number]  BSW placed a call to the patient on today's date to confirm receipt of SCAT application. Unfortunately, the patient did not answer and does not have a voice mailbox set-up. BSW to perform a case closure as BSW has been unable to maintain contact with the patient. BSW to mail the patient a case closure letter.  Daneen Schick, BSW, CDP [facility] [phone]

## 2018-06-17 ENCOUNTER — Other Ambulatory Visit: Payer: Self-pay

## 2018-06-17 NOTE — Patient Outreach (Signed)
[facility]) Care Management  06/17/2018  Ann Jensen Mar 31, 1922 [id number]  Incoming call from the patient regarding case closure letter, HIPAA identifiers confirmed. The patient is concerned her [employer] Medicare plan has been closed. BSW explained to the patient her case with [facility] Care [facility] has been closed due to an inability to maintain contact with the patient. The patient stated understanding. The patient also stated she has chosen not to pursue SCAT due to concerns of her scooter fitting on the bus. The patient also continues to reside at [facility] which provides transportation.  Daneen Schick, BSW, CDP [facility] [phone]

## 2018-06-26 DIAGNOSIS — H5201 Hypermetropia, right eye: Secondary | ICD-10-CM | POA: Diagnosis not present

## 2018-06-26 DIAGNOSIS — H5212 Myopia, left eye: Secondary | ICD-10-CM | POA: Diagnosis not present

## 2018-06-26 DIAGNOSIS — H353132 Nonexudative age-related macular degeneration, bilateral, intermediate dry stage: Secondary | ICD-10-CM | POA: Diagnosis not present

## 2018-07-09 ENCOUNTER — Encounter: Payer: Self-pay | Admitting: Internal Medicine

## 2018-07-10 ENCOUNTER — Encounter: Payer: PPO | Admitting: Family

## 2018-08-15 ENCOUNTER — Other Ambulatory Visit: Payer: Self-pay

## 2018-08-15 MED ORDER — METOPROLOL TARTRATE 50 MG PO TABS: 50.0000 mg | ORAL_TABLET | Freq: Two times a day (BID) | ORAL | 0 refills | Status: DC

## 2018-08-21 ENCOUNTER — Other Ambulatory Visit: Payer: Self-pay

## 2018-08-21 DIAGNOSIS — I10 Essential (primary) hypertension: Secondary | ICD-10-CM

## 2018-08-25 ENCOUNTER — Other Ambulatory Visit: Payer: PPO

## 2018-08-25 DIAGNOSIS — I10 Essential (primary) hypertension: Secondary | ICD-10-CM | POA: Diagnosis not present

## 2018-08-26 LAB — COMPREHENSIVE METABOLIC PANEL
AG RATIO: 1.5 (calc) (ref 1.0–2.5)
ALT: 12 U/L (ref 6–29)
AST: 18 U/L (ref 10–35)
Albumin: 4 g/dL (ref 3.6–5.1)
Alkaline phosphatase (APISO): 67 U/L (ref 33–130)
BUN / CREAT RATIO: 27 (calc) — AB (ref 6–22)
BUN: 45 mg/dL — AB (ref 7–25)
CO2: 32 mmol/L (ref 20–32)
CREATININE: 1.67 mg/dL — AB (ref 0.60–0.88)
Calcium: 9.7 mg/dL (ref 8.6–10.4)
Chloride: 94 mmol/L — ABNORMAL LOW (ref 98–110)
GLUCOSE: 88 mg/dL (ref 65–99)
Globulin: 2.7 g/dL (calc) (ref 1.9–3.7)
Potassium: 3.7 mmol/L (ref 3.5–5.3)
SODIUM: 137 mmol/L (ref 135–146)
TOTAL PROTEIN: 6.7 g/dL (ref 6.1–8.1)
Total Bilirubin: 0.5 mg/dL (ref 0.2–1.2)

## 2018-09-02 ENCOUNTER — Encounter: Payer: Self-pay | Admitting: Family

## 2018-09-02 ENCOUNTER — Non-Acute Institutional Stay: Payer: PPO | Admitting: Family

## 2018-09-02 VITALS — BP 100/68 | HR 68 | Temp 97.6°F | Resp 18 | Ht 59.0 in | Wt 139.6 lb

## 2018-09-02 DIAGNOSIS — I10 Essential (primary) hypertension: Secondary | ICD-10-CM

## 2018-09-02 DIAGNOSIS — G2581 Restless legs syndrome: Secondary | ICD-10-CM

## 2018-09-02 DIAGNOSIS — G4700 Insomnia, unspecified: Secondary | ICD-10-CM | POA: Diagnosis not present

## 2018-09-02 DIAGNOSIS — N183 Chronic kidney disease, stage 3 unspecified: Secondary | ICD-10-CM

## 2018-09-02 DIAGNOSIS — L602 Onychogryphosis: Secondary | ICD-10-CM

## 2018-09-02 DIAGNOSIS — H6121 Impacted cerumen, right ear: Secondary | ICD-10-CM

## 2018-09-02 DIAGNOSIS — R634 Abnormal weight loss: Secondary | ICD-10-CM

## 2018-09-02 DIAGNOSIS — M159 Polyosteoarthritis, unspecified: Secondary | ICD-10-CM | POA: Diagnosis not present

## 2018-09-02 MED ORDER — TRAZODONE HCL 50 MG PO TABS: 50.0000 mg | ORAL_TABLET | Freq: Every day | ORAL | 3 refills | Status: DC

## 2018-09-02 MED ORDER — MIRTAZAPINE 7.5 MG PO TABS: 7.5000 mg | ORAL_TABLET | Freq: Every day | ORAL | 0 refills | Status: DC

## 2018-09-02 NOTE — Patient Instructions (Signed)
1. Drink at least 6-8 glasses of water daily.  2. Follow up in 6 months for routine visit.Please get lab work done one week prior to visit.

## 2018-09-02 NOTE — Progress Notes (Signed)
Location:  [facility] (12) Provider: Terriah Reggio FNP-C   Ozzie Knobel, Nelda Bucks, NP  Patient Care Team: Travone Georg, Nelda Bucks, NP as PCP - General (Family Medicine) Wardell Honour, MD as Attending Physician (Family Medicine)  Extended Emergency Contact Information Primary Emergency Contact: [facility] Address: [place] [address] [place] Phone: [phone] Relation: Son Secondary Emergency Contact: Michaelis,Garth Address: [address]          [place], [place] [address] [place] Phone: [phone] Relation: Son  Code Status: DNR  Goals of care: Advanced Directive information Advanced Directives 09/02/2018  Does Patient Have a Medical Advance Directive? Yes  Type of Advance Directive Out of facility DNR (pink MOST or yellow form)  Does patient want to make changes to medical advance directive? No - Patient declined  Copy of [place] in Chart? -  Pre-existing out of facility DNR order (yellow form or pink MOST form) Yellow form placed in chart (order not valid for inpatient use)     Chief Complaint  Patient presents with  . Medical Management of Chronic Issues    4 month follow up    HPI:  Pt is a 84 y.o. female seen today [facility] for medical management of chronic diseases.she states had some cramping on her hands thought was low calcium.Her calcium level done 08/26/2018 was within normal limit.labs results discussed.Her BUN was 45,CR 1.67 ( 08/26/2018.she states drinks about 2 glasses of water per day.discussed with patient to increase fluid intake.she states will try.Her weight also reviewed.she has had a 8 pounds weight loss over the past 4 months.she states does not enjoy the meals here but will try to eat more.no recent fall episodes,acute illness or hospitalization since previous visit.    Hypertension -she denies any  headache,dizziness,chest pain or shortness of breath.Takes metoprolol,hydrochlorothiazide and lisinopril.she states has  stopped taking lasix.she has had no swelling on her legs or shortness of breath.  Restless leg syndrome - Takes  Requip.no worsening symptoms reported.  Insomnia - Had been taking  trazodone that she had at hand.she would like refill today.Has xanax but does not like how she feels when she takes it.   Hard of hearing - she states wears hearing aid but has trouble with her right ear.Her hearing aid does not fit well on the right ear.     Past Medical History:  Diagnosis Date  . Anemia   . Hypertension   . OA (osteoarthritis)   . Osteoporosis   . Renal cyst    Past Surgical History:  Procedure Laterality Date  . APPENDECTOMY    . BLADDER SUSPENSION    . CATARACT EXTRACTION    . CESAREAN SECTION    . REPLACEMENT TOTAL KNEE  1995  . TONSILLECTOMY      No Known Allergies  Allergies as of 09/02/2018   No Known Allergies     Medication List        Accurate as of 09/02/18  4:12 PM. Always use your most recent med list.          ALPRAZolam 0.25 MG tablet Commonly known as:  XANAX TAKE 1 TABLET AT BEDTIME AS NEEDED FOR ANXIETY OR SLEEP   Biotin 1000 MCG tablet Take 1,000 mcg by mouth daily.   fluticasone 50 MCG/ACT nasal spray Commonly known as:  FLONASE Place 1  spray into both nostrils daily.   furosemide 20 MG tablet Commonly known as:  LASIX Take 1 tablet (20 mg total) by mouth daily.   hydrochlorothiazide 12.5 MG capsule Commonly known as:  MICROZIDE TAKE (2) CAPSULES DAILY.   lisinopril 5 MG tablet Commonly known as:  PRINIVIL,ZESTRIL TAKE 1 TABLET BY MOUTH TWICE DAILY.   loratadine 10 MG tablet Commonly known as:  CLARITIN Take 1 tablet (10 mg total) by mouth daily.   metoprolol tartrate 50 MG tablet Commonly known as:  LOPRESSOR Take 1 tablet (50 mg total) by mouth 2 (two) times daily.   multivitamin-lutein Caps capsule Take 1  capsule by mouth daily.   rOPINIRole 1 MG tablet Commonly known as:  REQUIP TAKE 1 TABLET EACH EVENING.   traZODone 50 MG tablet Commonly known as:  DESYREL Take 50 mg by mouth at bedtime.       Review of Systems  Constitutional: Positive for unexpected weight change. Negative for appetite change, chills, fatigue and fever.       Has had 8 lbs weight loss over four months  HENT: Positive for hearing loss. Negative for congestion, rhinorrhea, sinus pressure, sinus pain, sneezing, sore throat and trouble swallowing.        Wears hearing aids.right hearing aids not fitting well   Eyes: Positive for visual disturbance. Negative for pain, discharge, redness and itching.       Wears eye glasses   Respiratory: Negative for cough, chest tightness, shortness of breath and wheezing.   Cardiovascular: Negative for chest pain, palpitations and leg swelling.  Gastrointestinal: Negative for abdominal distention, abdominal pain, constipation, diarrhea, nausea and vomiting.  Endocrine: Negative for cold intolerance, heat intolerance, polydipsia, polyphagia and polyuria.  Genitourinary: Negative for dysuria, flank pain, frequency and urgency.  Musculoskeletal: Positive for arthralgias and gait problem.  Skin: Negative for color change, pallor, rash and wound.  Neurological: Negative for dizziness, light-headedness and headaches.  Hematological: Does not bruise/bleed easily.  Psychiatric/Behavioral: Negative for agitation and confusion. The patient is not nervous/anxious.        Takes trazodone for sleep.     Immunization History  Administered Date(s) Administered  . Influenza, High Dose Seasonal PF 07/10/2017  . Influenza,inj,Quad PF,6+ Mos 07/03/2018  . Influenza-Unspecified 07/10/2017  . Pneumococcal Conjugate-13 08/18/2014  . Pneumococcal Polysaccharide-23 07/26/2010   Pertinent  Health Maintenance Due  Topic Date Due  . DEXA SCAN  01/02/2019 (Originally 05/16/1989)  . INFLUENZA VACCINE   Completed  . PNA vac Low Risk Adult  Completed   Fall Risk  09/02/2018 01/01/2018 07/22/2017  Falls in the past year? 0 No No  Number falls in past yr: 0 - -  Injury with Fall? 0 - -    Vitals:   09/02/18 1525  BP: 100/68  Pulse: 68  Resp: 18  Temp: 97.6 F (36.4 C)  TempSrc: Oral  SpO2: 97%  Weight: 139 lb 9.6 oz (63.3 kg)  Height: 4\' 11"  (1.499 m)   Body mass index is 28.2 kg/m. Physical Exam  Constitutional: She is oriented to person, place, and time.  Elderly in no acute distress   HENT:  Head: Normocephalic.  Left Ear: External ear normal.  Mouth/Throat: Oropharynx is clear and moist. No oropharyngeal exudate.  Right ear TM not visualized due to cerumen impaction.   Eyes: Pupils are equal, round, and reactive to light. Conjunctivae and EOM are normal. Right eye exhibits no discharge. Left eye exhibits no discharge. No scleral icterus.  Corrective lens in place  Neck: Normal range of motion. No JVD present. No thyromegaly present.  Cardiovascular: Intact distal pulses. Exam reveals no gallop and no friction rub.  Murmur heard. Pulmonary/Chest: Effort normal and breath sounds normal. No respiratory distress. She has no wheezes. She has no rales.  Abdominal: Soft. Bowel sounds are normal. She exhibits no distension and no mass. There is no tenderness. There is no rebound and no guarding.  Musculoskeletal: She exhibits no edema or tenderness.  Lymphadenopathy:    She has no cervical adenopathy.  Neurological: She is oriented to person, place, and time. Gait abnormal.  Skin: Skin is warm and dry. No rash noted. No erythema. No pallor.  Right leg shin area brown skin discoloration.right toe nails yellow and over grown.  Psychiatric: She has a normal mood and affect. Her speech is normal and behavior is normal. Judgment and thought content normal.  Vitals reviewed.   Labs reviewed: Recent Labs    10/31/17 0840 02/07/18 0000 08/25/18 0745  NA 136 137 137  K 3.8 4.0  3.7  CL 96* 96* 94*  CO2 33* 30 32  GLUCOSE 91 94 88  BUN 23 29* 45*  CREATININE 0.91* 1.17* 1.67*  CALCIUM 9.1 9.4 9.7   Recent Labs    02/07/18 0000 08/25/18 0745  AST 19 18  ALT 11 12  BILITOT 0.6 0.5  PROT 6.7 6.7   Recent Labs    02/07/18 0000  WBC 5.7  HGB 13.7  HCT 39.7  MCV 99.0  PLT 172   Lab Results  Component Value Date   TSH 1.81 02/07/2018   No results found for: HGBA1C Lab Results  Component Value Date   CHOL 177 02/07/2018   HDL 65 02/07/2018   LDLCALC 97 02/07/2018   TRIG 65 02/07/2018   CHOLHDL 2.7 02/07/2018    Significant Diagnostic Results in last 30 days:  No results found.  Assessment/Plan 1. Impacted cerumen of right ear TM not visualized.Recommended Debrox 6.5% otic solution 5 drops into right ear twice daily x 4 days then follow up for ear lavage.MD was able to apply peroxide then lavaged with warm water.patient's hearing on right ear improved.patient reported slight pain but tolerated procedure well.   2. Essential hypertension B/p stable.continue on metoprolol 50 mg tablet twice daily,lisinopril 5 mg tablet daily and Hydrochlorothiazide 12.5 mg capsule daily. CBC/diff,CMP, future   3. CKD (chronic kidney disease) stage 3, GFR 30-59 ml/min (HCC) Recent CR 1.69,BUN 45 has worsen compared to previous.encouraged to increased fluid intake.continue to avoid nephrotoxins and dose other medications for renal clearance. CMP,future.   4. Osteoarthritis of multiple joints, unspecified osteoarthritis type Continue tylenol as needed for pain.   5. RLS (restless legs syndrome) Continue on ropinirole 1 mg tablet daily.   6. Insomnia, unspecified type Had some trazodone from the past that she has been taking.Will discontinue Trazodone then start on mirtazapine 7.5 mg tablet daily at bedtime.  7. Weight loss  Has had 8 lbs weight loss over 4 months.Encouraged to eat her meals.Add mirtazapine 7.5 mg tablet daily at bedtime for appetite  stimulant.continue to monitor weight. TSH level,future   8.overgrown toenails  Overgrown toe nails to be trimmed by in house podiatrist.Instructed to apply vicks vapor rub to right yellow toe nails at bedtime.continue to monitor.   Family/ staff Communication: Reviewed plan of care with patient and Dr.Jaffer (POA).  Labs/tests ordered: CBC/diff,CMP,TSH level in 6 months   Follow up: 6 months    Sandrea Hughs, NP

## 2018-09-25 ENCOUNTER — Other Ambulatory Visit: Payer: Self-pay | Admitting: *Deleted

## 2018-09-25 MED ORDER — LISINOPRIL 5 MG PO TABS: 5.0000 mg | ORAL_TABLET | Freq: Two times a day (BID) | ORAL | 0 refills | Status: DC

## 2018-10-02 ENCOUNTER — Other Ambulatory Visit: Payer: Self-pay | Admitting: Family

## 2018-10-06 ENCOUNTER — Other Ambulatory Visit: Payer: Self-pay | Admitting: Family

## 2018-10-07 ENCOUNTER — Other Ambulatory Visit: Payer: Self-pay | Admitting: Family

## 2018-11-05 ENCOUNTER — Other Ambulatory Visit: Payer: Self-pay | Admitting: Nurse Practitioner

## 2018-11-12 ENCOUNTER — Emergency Department (HOSPITAL_COMMUNITY)
Admission: EM | Admit: 2018-11-12 | Discharge: 2018-11-12 | Disposition: A | Discharge status: Home / Self Care | Payer: PPO | Attending: Emergency Medicine | Admitting: Emergency Medicine

## 2018-11-12 ENCOUNTER — Emergency Department (HOSPITAL_COMMUNITY): Payer: PPO

## 2018-11-12 ENCOUNTER — Encounter (HOSPITAL_COMMUNITY): Payer: Self-pay

## 2018-11-12 ENCOUNTER — Other Ambulatory Visit: Payer: Self-pay

## 2018-11-12 DIAGNOSIS — Z79899 Other long term (current) drug therapy: Secondary | ICD-10-CM | POA: Diagnosis not present

## 2018-11-12 DIAGNOSIS — I10 Essential (primary) hypertension: Secondary | ICD-10-CM | POA: Insufficient documentation

## 2018-11-12 DIAGNOSIS — Y999 Unspecified external cause status: Secondary | ICD-10-CM | POA: Insufficient documentation

## 2018-11-12 DIAGNOSIS — Z96659 Presence of unspecified artificial knee joint: Secondary | ICD-10-CM | POA: Insufficient documentation

## 2018-11-12 DIAGNOSIS — Y92129 Unspecified place in nursing home as the place of occurrence of the external cause: Secondary | ICD-10-CM | POA: Diagnosis not present

## 2018-11-12 DIAGNOSIS — M2550 Pain in unspecified joint: Secondary | ICD-10-CM | POA: Diagnosis not present

## 2018-11-12 DIAGNOSIS — Z7401 Bed confinement status: Secondary | ICD-10-CM | POA: Diagnosis not present

## 2018-11-12 DIAGNOSIS — Z23 Encounter for immunization: Secondary | ICD-10-CM | POA: Insufficient documentation

## 2018-11-12 DIAGNOSIS — S81811A Laceration without foreign body, right lower leg, initial encounter: Secondary | ICD-10-CM | POA: Insufficient documentation

## 2018-11-12 DIAGNOSIS — Z87891 Personal history of nicotine dependence: Secondary | ICD-10-CM | POA: Diagnosis not present

## 2018-11-12 DIAGNOSIS — Y9389 Activity, other specified: Secondary | ICD-10-CM | POA: Diagnosis not present

## 2018-11-12 DIAGNOSIS — R0902 Hypoxemia: Secondary | ICD-10-CM | POA: Diagnosis not present

## 2018-11-12 MED ORDER — LIDOCAINE-EPINEPHRINE (PF) 2 %-1:200000 IJ SOLN: 10.0000 mL | Freq: Once | INTRAMUSCULAR | Status: DC

## 2018-11-12 MED ORDER — DOXYCYCLINE HYCLATE 100 MG PO TABS
100.0000 mg | ORAL_TABLET | Freq: Once | ORAL | Status: AC
  Administered 2018-11-12: 100 mg via ORAL
  Filled 2018-11-12: qty 1

## 2018-11-12 MED ORDER — LIDOCAINE HCL (PF) 1 % IJ SOLN
INTRAMUSCULAR | Status: AC
  Filled 2018-11-12: qty 30

## 2018-11-12 MED ORDER — LIDOCAINE-EPINEPHRINE (PF) 1 %-1:200000 IJ SOLN: 10.0000 mL | Freq: Once | INTRAMUSCULAR | Status: DC

## 2018-11-12 MED ORDER — DOXYCYCLINE HYCLATE 100 MG PO CAPS: 100.0000 mg | ORAL_CAPSULE | Freq: Two times a day (BID) | ORAL | 0 refills | Status: DC

## 2018-11-12 MED ORDER — LIDOCAINE-EPINEPHRINE 2 %-1:100000 IJ SOLN: 30.0000 mL | Freq: Once | INTRAMUSCULAR | Status: DC

## 2018-11-12 MED ORDER — TETANUS-DIPHTH-ACELL PERTUSSIS 5-2.5-18.5 LF-MCG/0.5 IM SUSP
0.5000 mL | Freq: Once | INTRAMUSCULAR | Status: AC
  Administered 2018-11-12: 0.5 mL via INTRAMUSCULAR
  Filled 2018-11-12: qty 0.5

## 2018-11-12 MED ORDER — BACITRACIN ZINC 500 UNIT/GM EX OINT
TOPICAL_OINTMENT | Freq: Two times a day (BID) | CUTANEOUS | Status: DC
  Administered 2018-11-12: 2 via TOPICAL
  Filled 2018-11-12: qty 2.7

## 2018-11-12 MED ORDER — LIDOCAINE-EPINEPHRINE (PF) 2 %-1:200000 IJ SOLN
20.0000 mL | Freq: Once | INTRAMUSCULAR | Status: DC
  Filled 2018-11-12: qty 20

## 2018-11-12 MED ORDER — BACITRACIN ZINC 500 UNIT/GM EX OINT: 1.0000 "application " | TOPICAL_OINTMENT | Freq: Two times a day (BID) | CUTANEOUS | 0 refills | Status: DC

## 2018-11-12 MED ORDER — ALPRAZOLAM 0.25 MG PO TABS: ORAL_TABLET | ORAL | 0 refills | Status: DC

## 2018-11-12 MED ORDER — LIDOCAINE-EPINEPHRINE (PF) 1 %-1:200000 IJ SOLN
10.0000 mL | Freq: Once | INTRAMUSCULAR | Status: AC
  Administered 2018-11-12: 10 mL
  Filled 2018-11-12: qty 30
  Filled 2018-11-12: qty 10

## 2018-11-12 NOTE — ED Notes (Signed)
ED Provider at bedside. 

## 2018-11-12 NOTE — ED Notes (Signed)
Pt to be going back to facility via [facility]. Per SW: Awaiting FL2 completion from SW. Hold transport/Discharge until completion of paperwork

## 2018-11-12 NOTE — ED Notes (Signed)
Per SW: Awaiting for facility approval of acceptance

## 2018-11-12 NOTE — ED Provider Notes (Signed)
..Laceration Repair Date/Time: 11/12/2018 11:34 AM Performed by: Carlisle Cater, PA-C Authorized by: Carlisle Cater, PA-C   Consent:    Consent obtained:  Verbal   Consent given by:  Patient and healthcare agent   Risks discussed:  Infection, pain, poor cosmetic result, poor wound healing and need for additional repair Anesthesia (see MAR for exact dosages):    Anesthesia method:  Local infiltration   Local anesthetic:  Lidocaine 1% WITH epi Laceration details:    Location:  Leg   Leg location:  L lower leg   Length (cm):  10 Repair type:    Repair type:  Simple Pre-procedure details:    Preparation:  Patient was prepped and draped in usual sterile fashion Exploration:    Hemostasis achieved with:  Direct pressure and epinephrine   Wound exploration: wound explored through full range of motion and entire depth of wound probed and visualized     Wound extent: no foreign bodies/material noted and no muscle damage noted     Contaminated: no   Treatment:    Area cleansed with:  Betadine and saline   Amount of cleaning:  Extensive   Irrigation solution:  Sterile saline   Irrigation volume:  1000cc   Irrigation method:  Pressure wash   Visualized foreign bodies/material removed: no   Skin repair:    Repair method:  Sutures   Suture size: combination 3-0, 4-0, 5-0.   Suture material:  Nylon   Suture technique:  Simple interrupted, vertical mattress and retention suture   Number of sutures:  13 Approximation:    Approximation:  Close Post-procedure details:    Patient tolerance of procedure:  Tolerated well, no immediate complications     .Marland KitchenLaceration Repair Date/Time: 11/12/2018 11:34 AM Performed by: Carlisle Cater, PA-C Authorized by: Carlisle Cater, PA-C   Consent:    Consent obtained:  Verbal   Consent given by:  Patient and healthcare agent   Risks discussed:  Infection, pain, poor cosmetic result, poor wound healing and need for additional repair Anesthesia (see  MAR for exact dosages):    Anesthesia method:  Local infiltration   Local anesthetic:  Lidocaine 1% WITH epi Laceration details:    Location:  Leg   Leg location:  L lower leg   Length (cm):  15 Repair type:    Repair type:  Simple Pre-procedure details:    Preparation:  Patient was prepped and draped in usual sterile fashion Exploration:    Hemostasis achieved with:  Direct pressure and epinephrine   Wound exploration: wound explored through full range of motion and entire depth of wound probed and visualized     Wound extent: no foreign bodies/material noted and no muscle damage noted     Contaminated: no   Treatment:    Area cleansed with:  Betadine and saline   Amount of cleaning:  Extensive   Irrigation solution:  Sterile saline   Irrigation volume:  8cc   Irrigation method:  Pressure wash, irrigation   Visualized foreign bodies/material removed: no   Skin repair:    Repair method:  Sutures   Suture size: combination 3-0, 4-0, 5-0.   Suture material:  Nylon   Suture technique:  Simple interrupted, vertical mattress and retention suture   Number of sutures:  18 Approximation:    Approximation:  Close Post-procedure details:    Patient tolerance of procedure:  Tolerated well, no immediate complications      Carlisle Cater, PA-C 11/12/18 1138    Varney Biles, MD  11/12/18 1325  

## 2018-11-12 NOTE — ED Notes (Signed)
Pts son out to nurses station requesting an update about d/c and FL2.Pts son informed  Per SW: Awaiting accemptance from [facility], awaiting to speak with Volney Presser. Pts son states "No no no. I spoke with [place] and SW hours ago and we are set" Pts son informed that  Per SW that this RN had just spoke with SW 10 mins prior and that was the update. Pt son states "No no you are wrong" SW called and asked to come speak with and update family. SW to bedside.

## 2018-11-12 NOTE — ED Notes (Signed)
Pt transported to back to facility with SNF.

## 2018-11-12 NOTE — ED Notes (Signed)
Pt awaiting PTAR. Pt and family updated

## 2018-11-12 NOTE — Progress Notes (Addendum)
CSW aware patient is from [place] but recently sustained a laceration to her leg. CSW spoke with patient's son who is requesting patient be transitioned to [facility] SNF and stated he has already been in contact with DON who stated they have an open bed. CSW spoke with Volney Presser who confirmed they had an open bed and requested FL2 and PASRR number. CSW provided this to Volney Presser, in addition to patient's AVS summary. Volney Presser confirmed patient could arrive to SNF today via PTAR.   Patient has been accepted to [facility] SNF for today 2/12. Patient, RN and EDP agreeable to discharge plan. CSW briefly spoke with patient's son in the room regarding discharge plans. Please call report to [phone]. PTAR has been called for transportation.    No further social work needs.  Ollen Barges, [place] Work Department  [employer]  [phone]

## 2018-11-12 NOTE — ED Notes (Signed)
Per SW: PTs has just been accepted. PTAR notified need for transport. Pt is 3-4 on list. Family and Pt updated.

## 2018-11-12 NOTE — ED Triage Notes (Addendum)
Pts scooter got away from her and her and  cut her right leg on the refrigerator Bleeding controlled at this time Pt is from a facility

## 2018-11-12 NOTE — Telephone Encounter (Signed)
Last refill 05/05/2018 database verified

## 2018-11-12 NOTE — ED Notes (Signed)
Bed: WA03 Expected date:  Expected time:  Means of arrival:  Comments: 

## 2018-11-12 NOTE — ED Notes (Signed)
Report called back to [facility] SN[facility] unit. Report given to [facility]

## 2018-11-12 NOTE — ED Notes (Signed)
Attempted to call SW for status update

## 2018-11-12 NOTE — ED Provider Notes (Signed)
[place] DEPT Provider Note   CSN: [id number] Arrival date & time: 11/12/18  9562     History   Chief Complaint Chief Complaint  Patient presents with  . Fall    HPI Ann Jensen is a 85 y.o. female.  HPI  85 year old female comes in a chief complaint of fall. Patient has history of CKD, hyperlipidemia, PVD, restless leg syndrome and osteoarthritis of her knee.  She uses her scooter to get around now, as she is unable to ambulate.  This morning she hit her scooter onto the refrigerator and had a fall.  When she fell she thinks that she cut her skin with either the scooter or the refrigerator.  Patient was helped out by staff at her independent living and sent to the emergency room.  Patient's son is at the bedside and also providing meaningful history.  Patient denies striking her head, and she denies any severe headaches, loss of consciousness, confusion, new numbness-weakness, neck pain.  Patient is not on any blood thinners.  Past Medical History:  Diagnosis Date  . Anemia   . Hypertension   . OA (osteoarthritis)   . Osteoporosis   . Renal cyst     Patient Active Problem List   Diagnosis Date Noted  . Obesity (BMI 30-39.9) 01/20/2018  . Macrocytic anemia 01/20/2018  . Hyperlipidemia LDL goal <100 01/20/2018  . CKD (chronic kidney disease) stage 3, GFR 30-59 ml/min (HCC) 01/20/2018  . Essential hypertension 07/22/2017  . PVD (peripheral vascular disease) ([place]) 07/22/2017  . Posterior tibial tendon dysfunction, right 07/22/2017  . Osteoporosis without current pathological fracture 07/22/2017  . Insomnia 07/22/2017  . RLS (restless legs syndrome) 07/22/2017  . Osteoarthritis of multiple joints 07/22/2017    Past Surgical History:  Procedure Laterality Date  . APPENDECTOMY    . BLADDER SUSPENSION    . CATARACT EXTRACTION    . CESAREAN SECTION    . REPLACEMENT TOTAL KNEE  1995  . TONSILLECTOMY       OB History   No  obstetric history on file.      Home Medications    Prior to Admission medications   Medication Sig Start Date End Date Taking? Authorizing Provider  ALPRAZolam (XANAX) 0.25 MG tablet TAKE 1 TABLET AT BEDTIME AS NEEDED FOR ANXIETY OR SLEEP 05/05/18   Blanchie Serve, MD  bacitracin ointment Apply 1 application topically 2 (two) times daily. 11/12/18   Varney Biles, MD  Biotin 1000 MCG tablet Take 1,000 mcg by mouth daily.    [provider]  doxycycline (VIBRAMYCIN) 100 MG capsule Take 1 capsule (100 mg total) by mouth 2 (two) times daily. 11/12/18   Varney Biles, MD  hydrochlorothiazide (MICROZIDE) 12.5 MG capsule TAKE (2) CAPSULES DAILY. 05/12/18   Blanchie Serve, MD  lisinopril (PRINIVIL,ZESTRIL) 5 MG tablet TAKE 1 TABLET BY MOUTH TWICE DAILY. 10/03/18   Mast, Man X, NP  loratadine (CLARITIN) 10 MG tablet Take 1 tablet (10 mg total) by mouth daily. 05/21/18   Blanchie Serve, MD  metoprolol tartrate (LOPRESSOR) 50 MG tablet Take 1 tablet (50 mg total) by mouth 2 (two) times daily. 08/15/18   Ngetich, Dinah C, NP  mirtazapine (REMERON) 7.5 MG tablet TAKE ONE TABLET AT BEDTIME. 11/05/18   Mast, Man X, NP  multivitamin-lutein (OCUVITE-LUTEIN) CAPS capsule Take 1 capsule by mouth daily.    [provider]  rOPINIRole (REQUIP) 1 MG tablet TAKE 1 TABLET EACH EVENING. 10/06/18   Mast, Man X, NP  Family History Family History  Problem Relation Age of Onset  . Colon cancer Mother   . Arthritis Mother   . Heart disease Father   . Heart attack Father   . Migraines Sister   . Allergies Sister   . Asthma Sister     Social History Social History   Tobacco Use  . Smoking status: Former Smoker    Types: E-cigarettes  . Smokeless tobacco: Never Used  . Tobacco comment: when she was 85 years old  Substance Use Topics  . Alcohol use: No  . Drug use: No     Allergies   Patient has no known allergies.   Review of Systems Review of Systems  Constitutional: Positive for  activity change.  Respiratory: Negative for shortness of breath.   Cardiovascular: Negative for chest pain.  Gastrointestinal: Negative for abdominal pain.  Skin: Positive for wound.  Allergic/Immunologic: Negative for immunocompromised state.  Neurological: Negative for headaches.  Hematological: Does not bruise/bleed easily.     Physical Exam Updated Vital Signs BP (!) 148/78 (BP Location: Right Arm)   Pulse 87   Temp 97.9 F (36.6 C) (Oral)   Resp 16   SpO2 97%   Physical Exam Vitals signs and nursing note reviewed.  Constitutional:      Appearance: She is well-developed.  HENT:     Head: Normocephalic and atraumatic.  Neck:     Musculoskeletal: Normal range of motion and neck supple.  Cardiovascular:     Rate and Rhythm: Normal rate.     Pulses: Normal pulses.  Pulmonary:     Effort: Pulmonary effort is normal.  Abdominal:     General: Bowel sounds are normal.  Musculoskeletal:        General: Tenderness present.     Comments: Right distal leg tenderness.  Skin:    General: Skin is warm and dry.     Findings: Bruising and lesion present.     Comments: Patient has 2 complex lacerations. The proximal laceration is at the junction of distal one third and middle one third of her right leg.  That laceration starts and ends superficially, however it is deep and gaping in between.  The laceration is irregular in shape.  Bone exposure appreciated when we were cleaning the wound.  Total length is about 24 cm which includes adjacent smaller laceration that is simpler and superficial.  More distally towards the ankle there is a more linear laceration that is deep and gaping.  This laceration measures about 12 cm.  Neurological:     Mental Status: She is alert and oriented to person, place, and time.      ED Treatments / Results  Labs (all labs ordered are listed, but only abnormal results are displayed) Labs Reviewed - No data to display  EKG None  Radiology Dg  Ankle Complete Right  Result Date: 11/12/2018 CLINICAL DATA:  Laceration. EXAM: RIGHT ANKLE - COMPLETE 3+ VIEW COMPARISON:  None. FINDINGS: There is a soft tissue laceration involving the medial soft tissues above the level of the ankle. No underlying tibial fracture. Remote healed distal fibular fracture noted along with probable old avulsion fractures involving the medial and lateral malleoli. Moderate ankle and subtalar joint degenerative changes and midfoot degenerative changes. IMPRESSION: Remote posttraumatic changes and degenerative changes but no acute bony findings or radiopaque foreign body. Large laceration involving the medial soft tissues of the right lower leg. Electronically Signed   By: Marijo Sanes M.D.   On: 11/12/2018  08:08    Procedures .Marland KitchenLaceration Repair Date/Time: 11/12/2018 11:52 AM Performed by: Varney Biles, MD Authorized by: Varney Biles, MD   Consent:    Consent obtained:  Verbal   Consent given by:  Patient   Risks discussed:  Infection, pain, poor cosmetic result, poor wound healing and need for additional repair Universal protocol:    Procedure explained and questions answered to patient or proxy's satisfaction: yes     Patient identity confirmed:  Arm band Anesthesia (see MAR for exact dosages):    Anesthesia method:  Local infiltration   Local anesthetic:  Lidocaine 1% WITH epi Laceration details:    Location:  Leg   Leg location:  R lower leg   Length (cm):  12   Depth (mm):  30 Repair type:    Repair type:  Complex Pre-procedure details:    Preparation:  Patient was prepped and draped in usual sterile fashion and imaging obtained to evaluate for foreign bodies Exploration:    Limited defect created (wound extended): no     Hemostasis achieved with:  Direct pressure   Wound exploration: wound explored through full range of motion and entire depth of wound probed and visualized     Wound extent: fascia violated     Wound extent: no foreign  bodies/material noted, no muscle damage noted, no underlying fracture noted and no vascular damage noted     Contaminated: no   Treatment:    Area cleansed with:  Betadine and saline   Amount of cleaning:  Extensive   Irrigation solution:  Sterile saline   Irrigation volume:  200   Irrigation method:  Pressure wash   Visualized foreign bodies/material removed: yes     Debridement:  Minimal   Scar revision: no   Skin repair:    Repair method:  Sutures   Suture size:  4-0 Approximation:    Approximation:  Loose Post-procedure details:    Dressing:  Antibiotic ointment   Patient tolerance of procedure:  Tolerated well, no immediate complications   (including critical care time)  Medications Ordered in ED Medications  doxycycline (VIBRA-TABS) tablet 100 mg (has no administration in time range)  bacitracin ointment (has no administration in time range)  Tdap (BOOSTRIX) injection 0.5 mL (0.5 mLs Intramuscular Given 11/12/18 0904)  lidocaine-EPINEPHrine (XYLOCAINE-EPINEPHrine) 1 %-1:200000 (PF) injection 10 mL (10 mLs Infiltration Given 11/12/18 1004)     Initial Impression / Assessment and Plan / ED Course  I have reviewed the triage vital signs and the nursing notes.  Pertinent labs & imaging results that were available during my care of the patient were reviewed by me and considered in my medical decision making (see chart for details).     85 year old comes in with chief complaint of laceration that she sustained because of a mechanical fall.  The wound is not contaminated, however there is high risk of infection given that it is deep.  X-ray ordered and there is no fracture.  This appears to be not a hard fall, patient denies hitting her head and she is not on any blood thinners nor is she having any red flags suggesting intracranial bleed on history.  Patient and family are okay with not getting CT scan of the brain to rule out brain bleed.  C-spine has been cleared  clinically.  X-ray of the ankle appears to be showing no signs of fracture.  I do not think she is having a distal tibial fracture.  Laceration was irrigated thoroughly and repaired.  APP performed laceration repair as well -see separate procedure note for the work dated.  Horizontal, vertical mattresses were utilized -additionally we also put simple interrupted and running sutures.  Tetanus updated.  Patient will be discharged with doxycycline and bacitracin ointment twice daily.  Family has been helpful in changing her bed status from independent to assisted living at her existing nursing home.  Wound care precautions discussed.  Final Clinical Impressions(s) / ED Diagnoses   Final diagnoses:  Laceration of right lower leg, initial encounter    ED Discharge Orders         Ordered    bacitracin ointment  2 times daily     11/12/18 1141    doxycycline (VIBRAMYCIN) 100 MG capsule  2 times daily     11/12/18 1141           Varney Biles, MD 11/12/18 1158

## 2018-11-12 NOTE — Discharge Instructions (Addendum)
We saw you in the ER for DEEP WOUND. Xrays don't show any break (fracture) to your ankle. Please read the instructions provided on wound care.  Keep the area clean and dry, apply bacitracin ointment daily and take the medications provided. We recommend that you apply bacitracin to the wound and place any count of not at the seal dressing on top of it.  Give the antibiotics as prescribed.  RETURN TO THE ER IF THERE IS INCREASED PAIN, REDNESS, PUS COMING OUT from the wound site.  The suture can be removed after 10 to 14 days.

## 2018-11-12 NOTE — NC FL2 (Signed)
[place] LEVEL OF CARE SCREENING TOOL     IDENTIFICATION  Patient Name: [facility] Birthdate: [birth year] Sex: female Admission Date (Current Location): 11/12/2018  [facility] and [place] Number:  Herbalist and Address:  [facility],  [place] [address] [employer] [place]      Provider Number: [id number]  Attending Physician Name and Address:  Varney Biles, MD  Relative Name and Phone Number:       Current Level of Care: SNF Recommended Level of Care: [place] Prior Approval Number:    Date Approved/Denied:   PASRR Number: [id number]  Discharge Plan: SNF    Current Diagnoses: Patient Active Problem List   Diagnosis Date Noted  . Obesity (BMI 30-39.9) 01/20/2018  . Macrocytic anemia 01/20/2018  . Hyperlipidemia LDL goal <100 01/20/2018  . CKD (chronic kidney disease) stage 3, GFR 30-59 ml/min (HCC) 01/20/2018  . Essential hypertension 07/22/2017  . PVD (peripheral vascular disease) ([place]) 07/22/2017  . Posterior tibial tendon dysfunction, right 07/22/2017  . Osteoporosis without current pathological fracture 07/22/2017  . Insomnia 07/22/2017  . RLS (restless legs syndrome) 07/22/2017  . Osteoarthritis of multiple joints 07/22/2017    Orientation RESPIRATION BLADDER Height & Weight     Self, Time, Situation, Place  Normal Continent Weight:   Height:     BEHAVIORAL SYMPTOMS/MOOD NEUROLOGICAL BOWEL NUTRITION STATUS      Continent Diet(Normal)  AMBULATORY STATUS COMMUNICATION OF NEEDS Skin   Extensive Assist(Uses scooter) Verbally Other (Comment) Patient has 2 complex lacerations. The proximal laceration is at the junction of distal one third and middle one third of her right leg.  That laceration starts and ends superficially, however it is deep and gaping in between.  The laceration is irregular in shape.  Bone exposure appreciated when we were cleaning the wound.  Total length is about 24 cm which includes  adjacent smaller laceration that is simpler and superficial.  More distally towards the ankle there is a more linear laceration that is deep and gaping.  This laceration measures about 12 cm.                       Personal Care Assistance Level of Assistance  Bathing, Feeding, Dressing Bathing Assistance: Independent Feeding assistance: Independent Dressing Assistance: Independent     Functional Limitations Info  Hearing   Hearing Info: Impaired      SPECIAL CARE FACTORS FREQUENCY                       Contractures      Additional Factors Info                  Current Medications (11/12/2018):  This is the current hospital active medication list No current facility-administered medications for this encounter.    Current Outpatient Medications  Medication Sig Dispense Refill  . ALPRAZolam (XANAX) 0.25 MG tablet TAKE 1 TABLET AT BEDTIME AS NEEDED FOR ANXIETY OR SLEEP 30 tablet 0  . Biotin 1000 MCG tablet Take 1,000 mcg by mouth daily.    . hydrochlorothiazide (MICROZIDE) 12.5 MG capsule TAKE (2) CAPSULES DAILY. 60 capsule 6  . lisinopril (PRINIVIL,ZESTRIL) 5 MG tablet TAKE 1 TABLET BY MOUTH TWICE DAILY. 60 tablet 2  . loratadine (CLARITIN) 10 MG tablet Take 1 tablet (10 mg total) by mouth daily. 30 tablet 11  . metoprolol tartrate (LOPRESSOR) 50 MG tablet Take 1 tablet (50 mg total) by mouth  2 (two) times daily. 180 tablet 0  . mirtazapine (REMERON) 7.5 MG tablet TAKE ONE TABLET AT BEDTIME. 30 tablet 0  . multivitamin-lutein (OCUVITE-LUTEIN) CAPS capsule Take 1 capsule by mouth daily.    Marland Kitchen rOPINIRole (REQUIP) 1 MG tablet TAKE 1 TABLET EACH EVENING. 90 tablet 1     Discharge Medications: Please see discharge summary for a list of discharge medications.  Relevant Imaging Results:  Relevant Lab Results:   Additional Information SSN: [id number]  Ollen Barges, LCSWA

## 2018-11-12 NOTE — ED Notes (Signed)
Wound to rt leg dressed with xeroform, guaze, guaze wrap and kerlex. Pt tolerated well

## 2018-11-12 NOTE — ED Notes (Signed)
EDP remains at bedside suturing

## 2018-11-12 NOTE — ED Notes (Signed)
Pt and family updated on SW plan of care and reason for wait. Pts son states "I have filled everything out. All someone has to do is sign it" Pts son informed that there is more to the process including physician  notes and recommendations. Pts son verbalizes understanding

## 2018-11-12 NOTE — ED Notes (Signed)
Pts dressing on rt leg redressed due to bloody drainage soling previous dressing.

## 2018-11-12 NOTE — ED Notes (Signed)
Attempted to call SW times 2. Unsuccessful

## 2018-11-12 NOTE — ED Notes (Signed)
Wound to rt lower leg irrigated and cleaned to pt toleration. Will attempt to clean

## 2018-11-13 DIAGNOSIS — R2681 Unsteadiness on feet: Secondary | ICD-10-CM | POA: Diagnosis not present

## 2018-11-13 DIAGNOSIS — M6281 Muscle weakness (generalized): Secondary | ICD-10-CM | POA: Diagnosis not present

## 2018-11-13 DIAGNOSIS — R2689 Other abnormalities of gait and mobility: Secondary | ICD-10-CM | POA: Diagnosis not present

## 2018-11-13 DIAGNOSIS — Z9181 History of falling: Secondary | ICD-10-CM | POA: Diagnosis not present

## 2018-11-14 ENCOUNTER — Non-Acute Institutional Stay (SKILLED_NURSING_FACILITY): Payer: PPO | Admitting: Internal Medicine

## 2018-11-14 ENCOUNTER — Encounter: Payer: Self-pay | Admitting: Internal Medicine

## 2018-11-14 DIAGNOSIS — G4700 Insomnia, unspecified: Secondary | ICD-10-CM | POA: Diagnosis not present

## 2018-11-14 DIAGNOSIS — S81811D Laceration without foreign body, right lower leg, subsequent encounter: Secondary | ICD-10-CM | POA: Diagnosis not present

## 2018-11-14 DIAGNOSIS — M76821 Posterior tibial tendinitis, right leg: Secondary | ICD-10-CM

## 2018-11-14 DIAGNOSIS — I10 Essential (primary) hypertension: Secondary | ICD-10-CM

## 2018-11-14 DIAGNOSIS — T148XXA Other injury of unspecified body region, initial encounter: Secondary | ICD-10-CM

## 2018-11-14 DIAGNOSIS — N183 Chronic kidney disease, stage 3 unspecified: Secondary | ICD-10-CM

## 2018-11-14 DIAGNOSIS — G2581 Restless legs syndrome: Secondary | ICD-10-CM | POA: Diagnosis not present

## 2018-11-14 NOTE — Progress Notes (Signed)
Provider:   Location:  [place] Room Number: 16 Place of Service:  SNF (31)  PCP: Mast, Man X, NP Patient Care Team: Mast, Man X, NP as PCP - General (Internal Medicine) Wardell Honour, MD as Attending Physician (Family Medicine)  Extended Emergency Contact Information Primary Emergency Contact: [facility] Address: [place] [address] [place] Phone: [phone] Relation: Son Secondary Emergency Contact: Shindler,Garth Address: [employer]          [place], [place] [address] [place] Phone: [phone] Relation: Son  Code Status: DNR Goals of Care: Advanced Directive information Advanced Directives 11/14/2018  Does Patient Have a Medical Advance Directive? Yes  Type of Advance Directive Out of facility DNR (pink MOST or yellow form)  Does patient want to make changes to medical advance directive? No - Patient declined  Copy of [place] in Chart? -  Pre-existing out of facility DNR order (yellow form or pink MOST form) Yellow form placed in chart (order not valid for inpatient use)      Chief Complaint  Patient presents with  . New Admit To SNF    new admit to facility     HPI: Patient is a 85 y.o. female seen today for admission to SNF for Wound Care and therapy. Patient had ED visit on 02/11 for Fall and Laceration in her Right Leg. Patient has h/o Hypertension, Anxiety, Restless leg Syndrome, Insomnia Patient lives in [place] [facility].  She is mostly wheelchair dependent.  Independent in her transfers. Sustained a mechanical fall in her apartment and had  Bleed and laceration in her right leg.  She was evaluated in ED and underwent laceration repair.  She did not have any head or neck injury. Patient is now in SNF for wound care.  She had complained of some pain in that leg but is using Tylenol PRN.  Denies any fever chills.    Past Medical  History:  Diagnosis Date  . Anemia   . Hypertension   . OA (osteoarthritis)   . Osteoporosis   . Renal cyst    Past Surgical History:  Procedure Laterality Date  . APPENDECTOMY    . BLADDER SUSPENSION    . CATARACT EXTRACTION    . CESAREAN SECTION    . REPLACEMENT TOTAL KNEE  1995  . TONSILLECTOMY      reports that she has quit smoking. Her smoking use included e-cigarettes. She has never used smokeless tobacco. She reports that she does not drink alcohol or use drugs. Social History   Socioeconomic History  . Marital status: Widowed    Spouse name: Not on file  . Number of children: 2  . Years of education: 3  . Highest education level: Not on file  Occupational History  . Occupation: Retired Ship broker  . Financial resource strain: Not hard at all  . Food insecurity:    Worry: Never true    Inability: Never true  . Transportation needs:    Medical: No    Non-medical: No  Tobacco Use  . Smoking status: Former Smoker    Types: E-cigarettes  . Smokeless tobacco: Never Used  . Tobacco comment: when she was 85 years old  Substance and Sexual Activity  . Alcohol use: No  . Drug use: No  . Sexual activity: Never    Birth control/protection: None  Lifestyle  . Physical activity:    Days per week: 0 days    Minutes per session: 0 min  . Stress: Not at all  Relationships  . Social connections:    Talks on phone: More than three times a week    Gets together: More than three times a week    Attends religious service: Never    Active member of club or organization: No    Attends meetings of clubs or organizations: Never    Relationship status: Widowed  . Intimate partner violence:    Fear of current or ex partner: No    Emotionally abused: No    Physically abused: No    Forced sexual activity: No  Other Topics Concern  . Not on file  Social History Narrative   Coffee-with Caffeine   Widow   2 sons   No pets   Retired Therapist, sports   No exercise   HCPOA,  Living Will, No DNR   + glasses   + hearing aids    Functional Status Survey:    Family History  Problem Relation Age of Onset  . Colon cancer Mother   . Arthritis Mother   . Heart disease Father   . Heart attack Father   . Migraines Sister   . Allergies Sister   . Asthma Sister     Health Maintenance  Topic Date Due  . DEXA SCAN  01/02/2019 (Originally 05/16/1989)  . TETANUS/TDAP  11/12/2028  . INFLUENZA VACCINE  Completed  . PNA vac Low Risk Adult  Completed    No Known Allergies  Outpatient Encounter Medications as of 11/14/2018  Medication Sig  . ALPRAZolam (XANAX) 0.25 MG tablet TAKE 1 TABLET AT BEDTIME AS NEEDED FOR ANXIETY OR SLEEP  . Biotin 1000 MCG tablet Take 1,000 mcg by mouth daily.  Marland Kitchen doxycycline (VIBRAMYCIN) 100 MG capsule Take 1 capsule (100 mg total) by mouth 2 (two) times daily.  . hydrochlorothiazide (MICROZIDE) 12.5 MG capsule TAKE (2) CAPSULES DAILY.  Marland Kitchen lisinopril (PRINIVIL,ZESTRIL) 5 MG tablet TAKE 1 TABLET BY MOUTH TWICE DAILY.  Marland Kitchen loratadine (CLARITIN) 10 MG tablet Take 1 tablet (10 mg total) by mouth daily.  . metoprolol tartrate (LOPRESSOR) 50 MG tablet Take 1 tablet (50 mg total) by mouth 2 (two) times daily.  . mirtazapine (REMERON) 7.5 MG tablet TAKE ONE TABLET AT BEDTIME.  . multivitamin-lutein (OCUVITE-LUTEIN) CAPS capsule Take 1 capsule by mouth daily.  Marland Kitchen rOPINIRole (REQUIP) 1 MG tablet TAKE 1 TABLET EACH EVENING.  Marland Kitchen zinc oxide 20 % ointment Apply 1 application topically as needed for irritation. Apply to buttocks, peri topical, after every incontinent episode and as needed for redness,may keep at bedside.  . [DISCONTINUED] bacitracin ointment Apply 1 application topically 2 (two) times daily.   No facility-administered encounter medications on file as of 11/14/2018.     Review of Systems  Constitutional: Positive for activity change.  HENT: Negative.   Respiratory: Negative.   Cardiovascular: Negative.   Gastrointestinal: Negative.     Genitourinary: Negative.   Musculoskeletal: Positive for myalgias.  Neurological: Positive for weakness.  Psychiatric/Behavioral: Positive for sleep disturbance.  All other systems reviewed and are negative.   Vitals:   11/14/18 1115  BP: 124/80  Pulse: 74  Resp: 20  Temp: 98.1 F (36.7 C)  SpO2: 92%  Weight: 132 lb (59.9 kg)  Height: 5\' 1"  (1.549 m)   Body mass index is 24.94 kg/m. Physical Exam Vitals signs reviewed.  Constitutional:  Appearance: Normal appearance.  HENT:     Head: Normocephalic.     Nose: Nose normal.     Mouth/Throat:     Mouth: Mucous membranes are dry.     Pharynx: Oropharynx is clear.  Eyes:     Pupils: Pupils are equal, round, and reactive to light.  Neck:     Musculoskeletal: Neck supple.  Cardiovascular:     Rate and Rhythm: Normal rate and regular rhythm.     Pulses: Normal pulses.     Heart sounds: Normal heart sounds.  Pulmonary:     Effort: Pulmonary effort is normal.     Breath sounds: Normal breath sounds.  Abdominal:     General: Abdomen is flat. Bowel sounds are normal.     Palpations: Abdomen is soft.  Musculoskeletal:        General: No swelling.  Skin:    Comments: Right Leg has 2 Laceration with total length of 24 cm Per ED notes .it was deep when they were cleaning it Also Has hematoma beneath the Laceration extending through her leg below the Knee  Neurological:     General: No focal deficit present.     Mental Status: She is alert and oriented to person, place, and time.  Psychiatric:        Mood and Affect: Mood normal.        Thought Content: Thought content normal.     Labs reviewed: Basic Metabolic Panel: Recent Labs    02/07/18 0000 08/25/18 0745  NA 137 137  K 4.0 3.7  CL 96* 94*  CO2 30 32  GLUCOSE 94 88  BUN 29* 45*  CREATININE 1.17* 1.67*  CALCIUM 9.4 9.7   Liver Function Tests: Recent Labs    02/07/18 0000 08/25/18 0745  AST 19 18  ALT 11 12  BILITOT 0.6 0.5  PROT 6.7 6.7   No  results for input(s): LIPASE, AMYLASE in the last 8760 hours. No results for input(s): AMMONIA in the last 8760 hours. CBC: Recent Labs    02/07/18 0000  WBC 5.7  HGB 13.7  HCT 39.7  MCV 99.0  PLT 172   Cardiac Enzymes: No results for input(s): CKTOTAL, CKMB, CKMBINDEX, TROPONINI in the last 8760 hours. BNP: Invalid input(s): POCBNP No results found for: HGBA1C Lab Results  Component Value Date   TSH 1.81 02/07/2018   No results found for: VITAMINB12 No results found for: FOLATE Lab Results  Component Value Date   FERRITIN 86 07/29/2017    Imaging and Procedures obtained prior to SNF admission: Dg Ankle Complete Right  Result Date: 11/12/2018 CLINICAL DATA:  Laceration. EXAM: RIGHT ANKLE - COMPLETE 3+ VIEW COMPARISON:  None. FINDINGS: There is a soft tissue laceration involving the medial soft tissues above the level of the ankle. No underlying tibial fracture. Remote healed distal fibular fracture noted along with probable old avulsion fractures involving the medial and lateral malleoli. Moderate ankle and subtalar joint degenerative changes and midfoot degenerative changes. IMPRESSION: Remote posttraumatic changes and degenerative changes but no acute bony findings or radiopaque foreign body. Large laceration involving the medial soft tissues of the right lower leg. Electronically Signed   By: Marijo Sanes M.D.   On: 11/12/2018 08:08    Assessment/Plan   Laceration of right lower extremity, subsequent encounter with Hematoma Saw the wound with the Nurse. They are doing Bacitracin Dressing on the wound BID. It is very Tender but not warm Will continue Doxycyline for 7 days Repeat CBC  Essential hypertension Her BP was low per patient after the Injury but is back to normal now. Hold Meds if SBP less then 100.  Patient does not want me to change anything right now  Posterior tibial tendon dysfunction She is mostly Wheelchair Dependent Is independent in her  transfers  CKD (chronic kidney disease) stage 3,  Will follow her BMP  RLS (restless legs syndrome) Stable on Requip  Insomnia, with Anxiety On Chronic Xanax Prn at Night     Family/ staff Communication:   Labs/tests ordered:cbc and BMP  Total time spent in this patient care encounter was 45_ minutes; greater than 50% of the visit spent counseling patient, reviewing records , Labs and coordinating care for problems addressed at this encounter.

## 2018-11-15 DIAGNOSIS — S81811A Laceration without foreign body, right lower leg, initial encounter: Secondary | ICD-10-CM | POA: Insufficient documentation

## 2018-11-18 ENCOUNTER — Encounter: Payer: Self-pay | Admitting: Nurse Practitioner

## 2018-11-18 ENCOUNTER — Non-Acute Institutional Stay (SKILLED_NURSING_FACILITY): Payer: PPO | Admitting: Nurse Practitioner

## 2018-11-18 DIAGNOSIS — Z7189 Other specified counseling: Secondary | ICD-10-CM | POA: Diagnosis not present

## 2018-11-18 DIAGNOSIS — S81811D Laceration without foreign body, right lower leg, subsequent encounter: Secondary | ICD-10-CM

## 2018-11-18 HISTORY — DX: Other specified counseling: Z71.89

## 2018-11-18 NOTE — Assessment & Plan Note (Signed)
Goals of care discussion: reviewed goals of care with the patient, the patient's representative-son Posey Rea. Social worker also present. Went over and filled out MOST for between 3:30-3:55pm. The patient would like to continue DNR when the patient has no pulse and is not breathing. She desires comfort measures, do not transfer to hospital unless comfort needs cannot be met in current location. She agrees to determine use or limitation of antibiotics when infection occurs. She would like IVF for a defined trial period, no feeding tube if oral fluid and nutrition unfeasible physically. Form was signed by the patient, the patient's son Dalores Weger,  social worker, and myself. Copies made for chart and family.

## 2018-11-18 NOTE — Assessment & Plan Note (Signed)
She presently resides in [facility] for traumatic right leg contusion, will complete total 7 day course of Doxy 100mg  bid started 11/12/18,  s/p suture closure, rehab, her goal is to [place] [facility] once her wound is healed, she regain her strength and ADL function

## 2018-11-18 NOTE — Progress Notes (Addendum)
Location:  [place] Room Number: 16 Place of Service:  SNF (31) Provider:  Marlana Latus  NP   Jamica Woodyard X, NP  Patient Care Team: Shalona Harbour X, NP as PCP - General (Internal Medicine) Wardell Honour, MD as Attending Physician (Family Medicine)  Extended Emergency Contact Information Primary Emergency Contact: [facility] Address: [address]          [place] [address] [place] Phone: [phone] Relation: Son Secondary Emergency Contact: Linson,Garth Address: [address]          [place], [place] [address] [place] Phone: [phone] Relation: Son  Code Status:  DNR Goals of care: Advanced Directive information Advanced Directives 11/14/2018  Does Patient Have a Medical Advance Directive? Yes  Type of Advance Directive Out of facility DNR (pink MOST or yellow form)  Does patient want to make changes to medical advance directive? No - Patient declined  Copy of [place] in Chart? -  Pre-existing out of facility DNR order (yellow form or pink MOST form) Yellow form placed in chart (order not valid for inpatient use)     Chief Complaint  Patient presents with  . Acute Visit    ACP    HPI:  Pt is a 85 y.o. female seen today for an acute visit for the advanced care planning/counseling discussion. She presently resides in [facility] for traumatic right leg contusion, will complete total 7 day course of Doxy 144m bid started 11/12/18,  s/p suture closure, rehab, her [place] is to [place] [facility] once her wound is healed, she regain her strength and ADL function   Past Medical History:  Diagnosis Date  . Anemia   . Hypertension   . OA (osteoarthritis)   . Osteoporosis   . Renal cyst    Past Surgical History:  Procedure Laterality Date  . APPENDECTOMY    . BLADDER SUSPENSION    . CATARACT EXTRACTION    . CESAREAN SECTION    . REPLACEMENT TOTAL KNEE  1995  . TONSILLECTOMY       No Known Allergies  Outpatient Encounter Medications as of 11/18/2018  Medication Sig  . ALPRAZolam (XANAX) 0.25 MG tablet Take 0.25 mg by mouth daily as needed for anxiety or sleep.  . Biotin 1000 MCG tablet Take 1,000 mcg by mouth daily.  .Marland Kitchendoxycycline (VIBRAMYCIN) 100 MG capsule Take 1 capsule (100 mg total) by mouth 2 (two) times daily.  . hydrochlorothiazide (MICROZIDE) 12.5 MG capsule TAKE (2) CAPSULES DAILY.  .Marland Kitchenlactose free nutrition (BOOST) LIQD Take 237 mLs by mouth 3 (three) times daily between meals.  .Marland Kitchenlisinopril (PRINIVIL,ZESTRIL) 5 MG tablet TAKE 1 TABLET BY MOUTH TWICE DAILY.  .Marland Kitchenloratadine (CLARITIN) 10 MG tablet Take 1 tablet (10 mg total) by mouth daily.  . metoprolol tartrate (LOPRESSOR) 50 MG tablet Take 1 tablet (50 mg total) by mouth 2 (two) times daily.  . mirtazapine (REMERON) 7.5 MG tablet TAKE ONE TABLET AT BEDTIME.  . multivitamin-lutein (OCUVITE-LUTEIN) CAPS capsule Take 1 capsule by mouth daily.  .Marland KitchenrOPINIRole (REQUIP) 1 MG tablet TAKE 1 TABLET EACH EVENING.  .Marland Kitchenzinc oxide 20 % ointment Apply 1 application topically as needed for irritation. Apply to buttocks, peri topical, after every incontinent episode and as needed for redness,may keep at bedside.  . [DISCONTINUED] ALPRAZolam (XANAX) 0.25 MG tablet TAKE 1 TABLET AT BEDTIME AS NEEDED FOR ANXIETY OR SLEEP (Patient taking differently: Take 0.25  mg daily as needed for anxiety or sleep.)   No facility-administered encounter medications on file as of 11/18/2018.     Review of Systems  Constitutional: Negative for activity change, appetite change, chills, diaphoresis, fatigue and fever.  HENT: Positive for hearing loss. Negative for congestion.   Cardiovascular: Positive for leg swelling.  Musculoskeletal: Positive for gait problem.  Skin: Positive for wound.  Neurological: Negative for dizziness, speech difficulty and headaches.  Psychiatric/Behavioral: Negative for agitation, behavioral problems and  hallucinations. The patient is not nervous/anxious.     Immunization History  Administered Date(s) Administered  . Influenza, High Dose Seasonal PF 07/10/2017  . Influenza,inj,Quad PF,6+ Mos 07/03/2018  . Influenza-Unspecified 07/10/2017  . Pneumococcal Conjugate-13 08/18/2014  . Pneumococcal Polysaccharide-23 07/26/2010  . Tdap 11/12/2018   Pertinent  Health Maintenance Due  Topic Date Due  . DEXA SCAN  01/02/2019 (Originally 05/16/1989)  . INFLUENZA VACCINE  Completed  . PNA vac Low Risk Adult  Completed   Fall Risk  09/02/2018 01/01/2018 07/22/2017  Falls in the past year? 0 No No  Number falls in past yr: 0 - -  Injury with Fall? 0 - -   Functional Status Survey:    Vitals:   11/18/18 1618  BP: 97/70  Pulse: 74  Resp: 16  Temp: (!) 97.1 F (36.2 C)  SpO2: 96%  Height: _0  (1.549 m)   Body mass index is 24.94 kg/m. Physical Exam Constitutional:      General: She is not in acute distress.    Appearance: Normal appearance. She is not ill-appearing, toxic-appearing or diaphoretic.  HENT:     Head: Normocephalic and atraumatic.     Nose: Nose normal.     Mouth/Throat:     Mouth: Mucous membranes are moist.  Eyes:     Extraocular Movements: Extraocular movements intact.     Pupils: Pupils are equal, round, and reactive to light.  Neck:     Musculoskeletal: Normal range of motion and neck supple.  Musculoskeletal:     Right lower leg: Edema present.     Left lower leg: Edema present.     Comments: Trace edema BLE. Right foot everted.   Skin:    General: Skin is warm and dry.     Findings: Bruising present.     Comments: Right lower leg laceration with suture closure per ED note. Right lower leg skin tear is new, no s/s of infection. Trace edema BLE.   Neurological:     General: No focal deficit present.     Mental Status: She is alert and oriented to person, place, and time. Mental status is at baseline.     Motor: No weakness.     Coordination: Coordination  normal.     Gait: Gait abnormal.  Psychiatric:        Mood and Affect: Mood normal.        Behavior: Behavior normal.        Thought Content: Thought content normal.        Judgment: Judgment normal.     Labs reviewed: Recent Labs    02/07/18 0000 08/25/18 0745  NA 137 137  K 4.0 3.7  CL 96* 94*  CO2 30 32  GLUCOSE 94 88  BUN 29* 45*  CREATININE 1.17* 1.67*  CALCIUM 9.4 9.7   Recent Labs    02/07/18 0000 08/25/18 0745  AST 19 18  ALT 11 12  BILITOT 0.6 0.5  PROT 6.7 6.7   Recent Labs  02/07/18 0000  WBC 5.7  HGB 13.7  HCT 39.7  MCV 99.0  PLT 172   Lab Results  Component Value Date   TSH 1.81 02/07/2018   No results found for: HGBA1C Lab Results  Component Value Date   CHOL 177 02/07/2018   HDL 65 02/07/2018   LDLCALC 97 02/07/2018   TRIG 65 02/07/2018   CHOLHDL 2.7 02/07/2018    Significant Diagnostic Results in last 30 days:  Dg Ankle Complete Right  Result Date: 11/12/2018 CLINICAL DATA:  Laceration. EXAM: RIGHT ANKLE - COMPLETE 3+ VIEW COMPARISON:  None. FINDINGS: There is a soft tissue laceration involving the medial soft tissues above the level of the ankle. No underlying tibial fracture. Remote healed distal fibular fracture noted along with probable old avulsion fractures involving the medial and lateral malleoli. Moderate ankle and subtalar joint degenerative changes and midfoot degenerative changes. IMPRESSION: Remote posttraumatic changes and degenerative changes but no acute bony findings or radiopaque foreign body. Large laceration involving the medial soft tissues of the right lower leg. Electronically Signed   By: Marijo Sanes M.D.   On: 11/12/2018 08:08    Assessment/Plan Advanced care planning/counseling discussion Goals of care discussion: reviewed goals of care with the patient, the patient's representative-son Posey Rea. Social worker also present. Went over and filled out MOST for between 3:30-3:55pm. The patient would like to  continue DNR when the patient has no pulse and is not breathing. She desires comfort measures, do not transfer to hospital unless comfort needs cannot be met in current location. She agrees to determine use or limitation of antibiotics when infection occurs. She would like IVF for a defined trial period, no feeding tube if oral fluid and nutrition unfeasible physically. Form was signed by the patient, the patient's son Juanette Urizar,  social worker, and myself. Copies made for chart and family.   Laceration of right lower extremity She presently resides in SNF [facility] for traumatic right leg contusion, will complete total 7 day course of Doxy 166m bid started 11/12/18,  s/p suture closure, rehab, [place] [facility] [place] [facility] is [facility], she regain her strength and ADL function       Family/ staff Communication: plan of care reviewed with the patient, the patient's HPOA/representative-son-Steve MSabra Heck sEducation officer, museum and cCamera operator   Labs/tests ordered:  None  Time spend 25 minutes.

## 2018-11-20 DIAGNOSIS — M6281 Muscle weakness (generalized): Secondary | ICD-10-CM | POA: Diagnosis not present

## 2018-11-20 DIAGNOSIS — D649 Anemia, unspecified: Secondary | ICD-10-CM | POA: Diagnosis not present

## 2018-11-20 DIAGNOSIS — H259 Unspecified age-related cataract: Secondary | ICD-10-CM | POA: Diagnosis not present

## 2018-11-20 DIAGNOSIS — M810 Age-related osteoporosis without current pathological fracture: Secondary | ICD-10-CM | POA: Diagnosis not present

## 2018-11-25 ENCOUNTER — Non-Acute Institutional Stay (SKILLED_NURSING_FACILITY): Payer: PPO | Admitting: Nurse Practitioner

## 2018-11-25 ENCOUNTER — Encounter: Payer: Self-pay | Admitting: Nurse Practitioner

## 2018-11-25 DIAGNOSIS — R635 Abnormal weight gain: Secondary | ICD-10-CM | POA: Insufficient documentation

## 2018-11-25 DIAGNOSIS — G4700 Insomnia, unspecified: Secondary | ICD-10-CM

## 2018-11-25 DIAGNOSIS — G2581 Restless legs syndrome: Secondary | ICD-10-CM | POA: Diagnosis not present

## 2018-11-25 DIAGNOSIS — J309 Allergic rhinitis, unspecified: Secondary | ICD-10-CM | POA: Diagnosis not present

## 2018-11-25 DIAGNOSIS — T8130XA Disruption of wound, unspecified, initial encounter: Secondary | ICD-10-CM | POA: Insufficient documentation

## 2018-11-25 DIAGNOSIS — I10 Essential (primary) hypertension: Secondary | ICD-10-CM | POA: Diagnosis not present

## 2018-11-25 DIAGNOSIS — I739 Peripheral vascular disease, unspecified: Secondary | ICD-10-CM

## 2018-11-25 DIAGNOSIS — S81811D Laceration without foreign body, right lower leg, subsequent encounter: Secondary | ICD-10-CM

## 2018-11-25 HISTORY — DX: Abnormal weight gain: R63.5

## 2018-11-25 NOTE — Assessment & Plan Note (Signed)
Chronic edema BLE, R>L in setting of traumatic laceration. Resume HCTZ 25mg  qd for swelling.

## 2018-11-25 NOTE — Assessment & Plan Note (Addendum)
The traumatic deep tissue and skin lacerations at the mid-lower right shin/claf and above the ankles showed no s/s of infection, sutures removed every other one, noted a several sites dehisced, steri-strips applies. Hx of venous insufficiency with chronic edematous BLE R>L. Resume HCTZ 25mg  po qd to reduce swelling in leg and promote healing. Will refer to wound care center for further treatment.

## 2018-11-25 NOTE — Assessment & Plan Note (Signed)
Stable, continue Requip 1mg qd 

## 2018-11-25 NOTE — Assessment & Plan Note (Signed)
Blood pressure is in control, will decrease Lisinopril to 2.5mg  qd in setting of resuming HCTZ 25mg  qd for leg edema. Continue Metoprolol 50mg  bid.

## 2018-11-25 NOTE — Assessment & Plan Note (Signed)
No s/s of infection, but not healing completely with areas dehisced when suture was removed every other one. Will f/u at wound care center upon discharge [place]

## 2018-11-25 NOTE — Assessment & Plan Note (Signed)
Continue Mirtazapine 7.5mg  qhs, prn Alprazolam 0.25mg  qhs

## 2018-11-25 NOTE — Assessment & Plan Note (Signed)
#  176Ibs 11/25/18, #132 Ibs 11/14/18, #139Ibs 09/02/18, # 147Ibs 05/21/18. The patient denied SOB, cough, sputum production. Will resume HCTZ 25mg  qd, monitor weight 3x/week. Observe.

## 2018-11-25 NOTE — Progress Notes (Signed)
Location:  [place] Room Number: 16 Place of Service:  SNF (31)  Provider:Lorne Winkels, ManXie  NP  PCP: Asmi Fugere X, NP Patient Care Team: Daisey Caloca X, NP as PCP - General (Internal Medicine) Wardell Honour, MD as Attending Physician (Family Medicine)  Extended Emergency Contact Information Primary Emergency Contact: [facility] Address: [address]          [place] [address] [place] Phone: [phone] Relation: Son Secondary Emergency Contact: [facility] Address: [address]          [place], [place] [address] [place] Phone: [phone] Relation: Son  Code Status: DNR Goals of care:  Advanced Directive information Advanced Directives 11/14/2018  Does Patient Have a Medical Advance Directive? Yes  Type of Advance Directive Out of facility DNR (pink MOST or yellow form)  Does patient want to make changes to medical advance directive? No - Patient declined  Copy of [place] in Chart? -  Pre-existing out of facility DNR order (yellow form or pink MOST form) Yellow form placed in chart (order not valid for inpatient use)     No Known Allergies  Chief Complaint  Patient presents with  . Discharge Note    HPI:  85 y.o. female was admitted to [facility] 11/12/18 for wound care and therapy following ED visit for  lacerations in the right lower leg sustained from run into the refrigerator. Suture closures were made in the mid shin/calf and above ankle areas. She has completed Doxycycline 100mg  bid for total 7 days. She has regained her physical strength and ADL function with assistance of therapy. She is hemodynamically stable to discharge [place] [facility] with assistance of family.   Hx of venous insufficiency, trace edema BLE R>L, currently she is off HCTZ. Hx of HTN, blood pressure is controlled on Lisinopril 5mg , Metoprolol 50mg  bid. Restless legs, stable on Requip 1mg  qpm. Insomnia, stable on  Mirtazapine 7.5mg  qhs, Alprazolam 0.25mg  hs prn.     Past Medical History:  Diagnosis Date  . Anemia   . Hypertension   . OA (osteoarthritis)   . Osteoporosis   . Renal cyst     Past Surgical History:  Procedure Laterality Date  . APPENDECTOMY    . BLADDER SUSPENSION    . CATARACT EXTRACTION    . CESAREAN SECTION    . REPLACEMENT TOTAL KNEE  1995  . TONSILLECTOMY        reports that she has quit smoking. Her smoking use included e-cigarettes. She has never used smokeless tobacco. She reports that she does not drink alcohol or use drugs. Social History   Socioeconomic History  . Marital status: Widowed    Spouse name: Not on file  . Number of children: 2  . Years of education: 3  . Highest education level: Not on file  Occupational History  . Occupation: Retired Ship broker  . Financial resource strain: Not hard at all  . Food insecurity:    Worry: Never true    Inability: Never true  . Transportation needs:    Medical: No    Non-medical: No  Tobacco Use  . Smoking status: Former Smoker    Types: E-cigarettes  . Smokeless tobacco: Never Used  . Tobacco comment: when she was 85 years old  Substance and Sexual Activity  . Alcohol use: No  . Drug use: No  . Sexual activity: Never    Birth control/protection: None  Lifestyle  . Physical activity:    Days per week: 0 days    Minutes per session: 0 min  . Stress: Not at all  Relationships  . Social connections:    Talks on phone: More than three times a week    Gets together: More than three times a week    Attends religious service: Never    Active member of club or organization: No    Attends meetings of clubs or organizations: Never    Relationship status: Widowed  . Intimate partner violence:    Fear of current or ex partner: No    Emotionally abused: No    Physically abused: No    Forced sexual activity: No  Other Topics Concern  . Not on file  Social History Narrative   Coffee-with  Caffeine   Widow   2 sons   No pets   Retired Therapist, sports   No exercise   HCPOA, Living Will, No DNR   + glasses   + hearing aids   Functional Status Survey:    No Known Allergies  Pertinent  Health Maintenance Due  Topic Date Due  . DEXA SCAN  01/02/2019 (Originally 05/16/1987)  . INFLUENZA VACCINE  Completed  . PNA vac Low Risk Adult  Completed    Medications: Outpatient Encounter Medications as of 11/25/2018  Medication Sig  . ALPRAZolam (XANAX) 0.25 MG tablet Take 1 tablet (0.25 mg total) by mouth daily as needed for anxiety or sleep.  . Amino Acids-Protein Hydrolys (FEEDING SUPPLEMENT, PRO-STAT SUGAR FREE 64,) LIQD Take 30 mLs by mouth 2 (two) times daily with a meal.  . Biotin 1000 MCG tablet Take 1,000 mcg by mouth daily.  Marland Kitchen lisinopril (PRINIVIL,ZESTRIL) 5 MG tablet Take 1 tablet (5 mg total) by mouth 2 (two) times daily.  Marland Kitchen loratadine (CLARITIN) 10 MG tablet Take 1 tablet (10 mg total) by mouth daily.  . metoprolol tartrate (LOPRESSOR) 50 MG tablet Take 1 tablet (50 mg total) by mouth 2 (two) times daily.  . mirtazapine (REMERON) 7.5 MG tablet Take 1 tablet (7.5 mg total) by mouth at bedtime.  . multivitamin-lutein (OCUVITE-LUTEIN) CAPS capsule Take 1 capsule by mouth daily.  Marland Kitchen rOPINIRole (REQUIP) 1 MG tablet TAKE 1 TABLET EACH EVENING.  Marland Kitchen zinc oxide 20 % ointment Apply 1 application topically as needed for irritation. Apply to buttocks, peri topical, after every incontinent episode and as needed for redness,may keep at bedside.  . [DISCONTINUED] ALPRAZolam (XANAX) 0.25 MG tablet Take 0.25 mg by mouth daily as needed for anxiety or sleep.  . [DISCONTINUED] Amino Acids-Protein Hydrolys (FEEDING SUPPLEMENT, PRO-STAT SUGAR FREE 64,) LIQD Take 30 mLs by mouth 2 (two) times daily with a meal.  . [DISCONTINUED] doxycycline (VIBRAMYCIN) 100 MG capsule Take 1 capsule (100 mg total) by mouth 2 (two) times daily.  . [DISCONTINUED] hydrochlorothiazide (MICROZIDE) 12.5 MG capsule TAKE (2)  CAPSULES DAILY.  . [DISCONTINUED] lactose free nutrition (BOOST) LIQD Take 237 mLs by mouth 3 (three) times daily between meals.  . [DISCONTINUED] lisinopril (PRINIVIL,ZESTRIL) 5 MG tablet TAKE 1 TABLET BY MOUTH TWICE DAILY.  . [DISCONTINUED] loratadine (CLARITIN) 10 MG tablet Take 1 tablet (10 mg total) by mouth daily.  . [DISCONTINUED] metoprolol tartrate (LOPRESSOR) 50 MG tablet Take 1 tablet (50 mg total) by mouth 2 (two) times daily.  . [DISCONTINUED] mirtazapine (REMERON) 7.5 MG tablet TAKE ONE TABLET AT BEDTIME.  . [DISCONTINUED] rOPINIRole (REQUIP) 1 MG tablet TAKE 1 TABLET EACH EVENING.  . [DISCONTINUED] zinc oxide  20 % ointment Apply 1 application topically as needed for irritation. Apply to buttocks, peri topical, after every incontinent episode and as needed for redness,may keep at bedside.   No facility-administered encounter medications on file as of 11/25/2018.    The patient's son Dr Teodoro Spray present during today's visit.  Review of Systems  Constitutional: Positive for unexpected weight change. Negative for activity change, appetite change, chills, diaphoresis, fatigue and fever.       #176Ibs 11/25/18, #132 Ibs 11/14/18, #139Ibs 09/02/18, # 147Ibs 05/21/18.  HENT: Positive for hearing loss. Negative for voice change.   Eyes: Negative for visual disturbance.  Respiratory: Negative for cough, shortness of breath and wheezing.   Cardiovascular: Positive for leg swelling. Negative for chest pain and palpitations.  Gastrointestinal: Negative for abdominal distention, abdominal pain, constipation, diarrhea, nausea and vomiting.  Genitourinary: Negative for difficulty urinating, dysuria and urgency.  Musculoskeletal: Positive for arthralgias and gait problem.  Skin: Positive for wound. Negative for color change.  Neurological: Negative for dizziness, speech difficulty, weakness and headaches.       Restless leg symptoms.   Psychiatric/Behavioral: Positive for sleep disturbance.  Negative for agitation, behavioral problems and hallucinations. The patient is not nervous/anxious.     Vitals:   11/25/18 1155  BP: 106/69  Pulse: 82  Resp: 20  Temp: 98 F (36.7 C)  SpO2: 93%  Weight: 176 lb 3.2 oz (79.9 kg)  Height: 5\' 1"  (1.549 m)   Body mass index is 33.29 kg/m. Physical Exam Vitals signs and nursing note reviewed.  Constitutional:      General: She is not in acute distress.    Appearance: Normal appearance. She is obese. She is not ill-appearing, toxic-appearing or diaphoretic.  HENT:     Head: Normocephalic and atraumatic.     Nose: Nose normal.     Mouth/Throat:     Mouth: Mucous membranes are moist.  Eyes:     Extraocular Movements: Extraocular movements intact.     Pupils: Pupils are equal, round, and reactive to light.  Neck:     Musculoskeletal: Normal range of motion and neck supple.  Cardiovascular:     Rate and Rhythm: Normal rate and regular rhythm.     Heart sounds: No murmur.  Pulmonary:     Effort: Pulmonary effort is normal.     Breath sounds: No wheezing or rhonchi.  Abdominal:     General: There is no distension.     Palpations: Abdomen is soft.     Tenderness: There is no abdominal tenderness. There is no guarding or rebound.  Musculoskeletal:     Right lower leg: Edema present.     Left lower leg: Edema present.     Comments: Trace to 1+ edema BLE, R>L  Skin:    Findings: Bruising present.     Comments: Mid shin to medical calf and above the ankle of the lower leg deep tissue/skin lacerations showed no s/s of infection, but not healing completely, noted dehisced areas after suture removed every other other one. There is superficial skin missing area at the mid shin/calf wound.   Neurological:     General: No focal deficit present.     Mental Status: She is alert and oriented to person, place, and time. Mental status is at baseline.     Cranial Nerves: No cranial nerve deficit.     Motor: No weakness.     Coordination:  Coordination normal.     Gait: Gait abnormal.  Psychiatric:  Mood and Affect: Mood normal.        Behavior: Behavior normal.        Thought Content: Thought content normal.        Judgment: Judgment normal.     Labs reviewed: Basic Metabolic Panel: Recent Labs    02/07/18 0000 08/25/18 0745  NA 137 137  K 4.0 3.7  CL 96* 94*  CO2 30 32  GLUCOSE 94 88  BUN 29* 45*  CREATININE 1.17* 1.67*  CALCIUM 9.4 9.7   Liver Function Tests: Recent Labs    02/07/18 0000 08/25/18 0745  AST 19 18  ALT 11 12  BILITOT 0.6 0.5  PROT 6.7 6.7   No results for input(s): LIPASE, AMYLASE in the last 8760 hours. No results for input(s): AMMONIA in the last 8760 hours. CBC: Recent Labs    02/07/18 0000  WBC 5.7  HGB 13.7  HCT 39.7  MCV 99.0  PLT 172   Cardiac Enzymes: No results for input(s): CKTOTAL, CKMB, CKMBINDEX, TROPONINI in the last 8760 hours. BNP: Invalid input(s): POCBNP CBG: No results for input(s): GLUCAP in the last 8760 hours.  Procedures and Imaging Studies During Stay: Dg Ankle Complete Right  Result Date: 11/12/2018 CLINICAL DATA:  Laceration. EXAM: RIGHT ANKLE - COMPLETE 3+ VIEW COMPARISON:  None. FINDINGS: There is a soft tissue laceration involving the medial soft tissues above the level of the ankle. No underlying tibial fracture. Remote healed distal fibular fracture noted along with probable old avulsion fractures involving the medial and lateral malleoli. Moderate ankle and subtalar joint degenerative changes and midfoot degenerative changes. IMPRESSION: Remote posttraumatic changes and degenerative changes but no acute bony findings or radiopaque foreign body. Large laceration involving the medial soft tissues of the right lower leg. Electronically Signed   By: Marijo Sanes M.D.   On: 11/12/2018 08:08    Assessment/Plan:   Dehiscence of wound of skin The traumatic deep tissue and skin lacerations at the mid-lower right shin/claf and above the ankles  showed no s/s of infection, sutures removed every other one, noted a several sites dehisced, steri-strips applies. Hx of venous insufficiency with chronic edematous BLE R>L. Resume HCTZ 25mg  po qd to reduce swelling in leg and promote healing. Will refer to wound care center for further treatment.     Essential hypertension Blood pressure is in control, will decrease Lisinopril to 2.5mg  qd in setting of resuming HCTZ 25mg  qd for leg edema. Continue Metoprolol 50mg  bid.   PVD (peripheral vascular disease) (HCC) Chronic edema BLE, R>L in setting of traumatic laceration. Resume HCTZ 25mg  qd for swelling.   Insomnia Continue Mirtazapine 7.5mg  qhs, prn Alprazolam 0.25mg  qhs  Laceration of right lower extremity No s/s of infection, but not healing completely with areas dehisced when suture was removed every other one. Will f/u at wound care center upon discharge [place] [facility]  RLS (restless legs syndrome) Stable, continue Requip 1mg  qd.   Weight gain #176Ibs 11/25/18, #132 Ibs 11/14/18, #139Ibs 09/02/18, # 147Ibs 05/21/18. The patient denied SOB, cough, sputum production. Will resume HCTZ 25mg  qd, monitor weight 3x/week. Observe.    Plan of care reviewed with the patient, the patient's son Dr. Teodoro Spray, and charge nurse.   Patient is being discharged with the following home health services:    Patient is being discharged with the following durable medical equipment:    Patient has been advised to f/u with their PCP in 1-2 weeks to for a transitions of care visit.  Social services  at their facility was responsible for arranging this appointment.  Pt was provided with adequate prescriptions of noncontrolled medications to reach the scheduled appointment .  For controlled substances, a limited supply was provided as appropriate for the individual patient.  If the pt normally receives these medications from a pain clinic or has a contract with another physician, these medications should be received from  that clinic or physician only).    Future labs/tests needed:  none

## 2018-11-26 MED ORDER — ROPINIROLE HCL 1 MG PO TABS: ORAL_TABLET | ORAL | 1 refills | Status: DC

## 2018-11-26 MED ORDER — ZINC OXIDE 20 % EX OINT: 1.0000 "application " | TOPICAL_OINTMENT | CUTANEOUS | 0 refills | Status: DC | PRN

## 2018-11-26 MED ORDER — ALPRAZOLAM 0.25 MG PO TABS: 0.2500 mg | ORAL_TABLET | Freq: Every day | ORAL | 0 refills | Status: DC | PRN

## 2018-11-26 MED ORDER — PRO-STAT SUGAR FREE PO LIQD: 30.0000 mL | Freq: Two times a day (BID) | ORAL | 0 refills | Status: DC

## 2018-11-26 MED ORDER — LISINOPRIL 5 MG PO TABS: 5.0000 mg | ORAL_TABLET | Freq: Two times a day (BID) | ORAL | 2 refills | Status: DC

## 2018-11-26 MED ORDER — MIRTAZAPINE 7.5 MG PO TABS: 7.5000 mg | ORAL_TABLET | Freq: Every day | ORAL | 0 refills | Status: DC

## 2018-11-26 MED ORDER — METOPROLOL TARTRATE 50 MG PO TABS: 50.0000 mg | ORAL_TABLET | Freq: Two times a day (BID) | ORAL | 0 refills | Status: DC

## 2018-11-26 MED ORDER — LORATADINE 10 MG PO TABS: 10.0000 mg | ORAL_TABLET | Freq: Every day | ORAL | 11 refills | Status: DC

## 2018-11-27 DIAGNOSIS — M6281 Muscle weakness (generalized): Secondary | ICD-10-CM | POA: Diagnosis not present

## 2018-11-27 DIAGNOSIS — I10 Essential (primary) hypertension: Secondary | ICD-10-CM | POA: Diagnosis not present

## 2018-12-01 ENCOUNTER — Encounter (HOSPITAL_BASED_OUTPATIENT_CLINIC_OR_DEPARTMENT_OTHER): Payer: PPO | Attending: Internal Medicine

## 2018-12-01 DIAGNOSIS — I872 Venous insufficiency (chronic) (peripheral): Secondary | ICD-10-CM | POA: Diagnosis not present

## 2018-12-01 DIAGNOSIS — N183 Chronic kidney disease, stage 3 (moderate): Secondary | ICD-10-CM | POA: Diagnosis not present

## 2018-12-01 DIAGNOSIS — I739 Peripheral vascular disease, unspecified: Secondary | ICD-10-CM | POA: Insufficient documentation

## 2018-12-01 DIAGNOSIS — Z96651 Presence of right artificial knee joint: Secondary | ICD-10-CM | POA: Insufficient documentation

## 2018-12-01 DIAGNOSIS — L97812 Non-pressure chronic ulcer of other part of right lower leg with fat layer exposed: Secondary | ICD-10-CM | POA: Diagnosis not present

## 2018-12-01 DIAGNOSIS — S81811A Laceration without foreign body, right lower leg, initial encounter: Secondary | ICD-10-CM | POA: Diagnosis not present

## 2018-12-01 DIAGNOSIS — T8131XA Disruption of external operation (surgical) wound, not elsewhere classified, initial encounter: Secondary | ICD-10-CM | POA: Diagnosis not present

## 2018-12-01 DIAGNOSIS — Y838 Other surgical procedures as the cause of abnormal reaction of the patient, or of later complication, without mention of misadventure at the time of the procedure: Secondary | ICD-10-CM | POA: Diagnosis not present

## 2018-12-01 DIAGNOSIS — I87331 Chronic venous hypertension (idiopathic) with ulcer and inflammation of right lower extremity: Secondary | ICD-10-CM | POA: Insufficient documentation

## 2018-12-01 DIAGNOSIS — M069 Rheumatoid arthritis, unspecified: Secondary | ICD-10-CM | POA: Diagnosis not present

## 2018-12-01 DIAGNOSIS — I129 Hypertensive chronic kidney disease with stage 1 through stage 4 chronic kidney disease, or unspecified chronic kidney disease: Secondary | ICD-10-CM | POA: Insufficient documentation

## 2018-12-08 DIAGNOSIS — I872 Venous insufficiency (chronic) (peripheral): Secondary | ICD-10-CM | POA: Diagnosis not present

## 2018-12-08 DIAGNOSIS — S81801A Unspecified open wound, right lower leg, initial encounter: Secondary | ICD-10-CM | POA: Diagnosis not present

## 2018-12-08 DIAGNOSIS — I87331 Chronic venous hypertension (idiopathic) with ulcer and inflammation of right lower extremity: Secondary | ICD-10-CM | POA: Diagnosis not present

## 2018-12-09 DIAGNOSIS — S81801A Unspecified open wound, right lower leg, initial encounter: Secondary | ICD-10-CM | POA: Diagnosis not present

## 2018-12-16 ENCOUNTER — Other Ambulatory Visit: Payer: Self-pay | Admitting: Family

## 2018-12-17 ENCOUNTER — Other Ambulatory Visit: Payer: Self-pay

## 2018-12-17 ENCOUNTER — Encounter: Payer: Self-pay | Admitting: Internal Medicine

## 2018-12-17 ENCOUNTER — Non-Acute Institutional Stay: Payer: PPO | Admitting: Internal Medicine

## 2018-12-17 VITALS — BP 118/74 | HR 68 | Temp 98.0°F | Ht 61.0 in | Wt 141.2 lb

## 2018-12-17 DIAGNOSIS — T8130XS Disruption of wound, unspecified, sequela: Secondary | ICD-10-CM | POA: Diagnosis not present

## 2018-12-17 DIAGNOSIS — G2581 Restless legs syndrome: Secondary | ICD-10-CM | POA: Diagnosis not present

## 2018-12-17 DIAGNOSIS — N183 Chronic kidney disease, stage 3 unspecified: Secondary | ICD-10-CM

## 2018-12-17 DIAGNOSIS — I10 Essential (primary) hypertension: Secondary | ICD-10-CM | POA: Diagnosis not present

## 2018-12-17 NOTE — Progress Notes (Signed)
Location: [place] of Service:  Clinic (12)  Provider:   Code Status: DNR Goals of Care:  Advanced Directives 12/17/2018  Does Patient Have a Medical Advance Directive? No  Type of Advance Directive Out of facility DNR (pink MOST or yellow form)  Does patient want to make changes to medical advance directive? No - Patient declined  Copy of [place] in Chart? -  Would patient like information on creating a medical advance directive? No - Patient declined  Pre-existing out of facility DNR order (yellow form or pink MOST form) Pink MOST form placed in chart (order not valid for inpatient use);Yellow form placed in chart (order not valid for inpatient use)     Chief Complaint  Patient presents with  . Medical Management of Chronic Issues    2 week follow up     HPI: Patient is a 85 y.o. female seen today for an acute visit for follow up from the discharge from SNF. She was admitted to SNF for wound care from 02/14-02/25   Patient has h/o Hypertension, Anxiety, Restless leg Syndrome, Insomnia, Arthritis and Posterior Tendon Dysfunction with right foot Valgus in Right Knee with inability to Ambualte  She lives in [place] [facility] [place] [facility].She is mostly Wheelchair dependent.Though independent in her transfers. She had a mechanical fall in her apartment leading to bleed and laceration in her right leg.  She had laceration repair in ED.  She did not have any head or neck injury.  She was in SNF for [place] clinic patient says that she follows with the wound care clinic every 2 weeks.  Her daughter-in-law is Therapist, sports and is helping her with dressings.  Silver alginate with Wrap around the wound. Her son is pHysician and keeping close watch on her Wound. She has no fever. No pain.  She is getting caregiver to come to her Apartment to help her few hours a week. Otherwise she is independent. She had no  other complains  Past Medical History:  Diagnosis Date  . Anemia   . Hypertension   . OA (osteoarthritis)   . Osteoporosis   . Renal cyst     Past Surgical History:  Procedure Laterality Date  . APPENDECTOMY    . BLADDER SUSPENSION    . CATARACT EXTRACTION    . CESAREAN SECTION    . REPLACEMENT TOTAL KNEE  1995  . TONSILLECTOMY      No Known Allergies  Outpatient Encounter Medications as of 12/17/2018  Medication Sig  . ALPRAZolam (XANAX) 0.25 MG tablet Take 1 tablet (0.25 mg total) by mouth daily as needed for anxiety or sleep.  . Amino Acids-Protein Hydrolys (FEEDING SUPPLEMENT, PRO-STAT SUGAR FREE 64,) LIQD Take 30 mLs by mouth 2 (two) times daily with a meal.  . Biotin 1000 MCG tablet Take 1,000 mcg by mouth daily.  . cholecalciferol (VITAMIN D3) 25 MCG (1000 UT) tablet Take 1,000 Units by mouth daily.  . hydrochlorothiazide (MICROZIDE) 12.5 MG capsule Take 12.5 mg by mouth daily.  . metoprolol tartrate (LOPRESSOR) 50 MG tablet TAKE 1 TABLET BY MOUTH TWICE DAILY.  . mirtazapine (REMERON) 7.5 MG tablet Take 1 tablet (7.5 mg total) by mouth at bedtime.  . multivitamin-lutein (OCUVITE-LUTEIN) CAPS capsule Take 1 capsule by mouth daily.  Marland Kitchen rOPINIRole (REQUIP) 1 MG tablet TAKE 1 TABLET EACH EVENING.  Marland Kitchen zinc oxide 20 % ointment Apply 1 application topically  as needed for irritation. Apply to buttocks, peri topical, after every incontinent episode and as needed for redness,may keep at bedside.  Marland Kitchen lisinopril (PRINIVIL,ZESTRIL) 5 MG tablet Take 1 tablet (5 mg total) by mouth 2 (two) times daily.  . [DISCONTINUED] loratadine (CLARITIN) 10 MG tablet Take 1 tablet (10 mg total) by mouth daily.   No facility-administered encounter medications on file as of 12/17/2018.     Review of Systems:  Review of Systems  Constitutional: Positive for activity change.  HENT: Negative.   Respiratory: Negative.   Cardiovascular: Positive for leg swelling.  Gastrointestinal: Negative.    Genitourinary: Negative.   Musculoskeletal: Negative.   Skin: Negative.   Neurological: Negative.   Psychiatric/Behavioral: Positive for sleep disturbance.    Health Maintenance  Topic Date Due  . DEXA SCAN  01/02/2019 (Originally 05/16/1987)  . TETANUS/TDAP  11/12/2028  . INFLUENZA VACCINE  Completed  . PNA vac Low Risk Adult  Completed    Physical Exam: Vitals:   12/17/18 1331  BP: 118/74  Pulse: 68  Temp: 98 F (36.7 C)  TempSrc: Oral  SpO2: 97%  Weight: 141 lb 3.2 oz (64 kg)  Height: 5\' 1"  (1.549 m)   Body mass index is 26.68 kg/m. Physical Exam Vitals signs reviewed.  Constitutional:      Appearance: Normal appearance.  HENT:     Head: Normocephalic.     Nose: Nose normal.     Mouth/Throat:     Mouth: Mucous membranes are moist.     Pharynx: Oropharynx is clear.  Eyes:     Pupils: Pupils are equal, round, and reactive to light.  Neck:     Musculoskeletal: Neck supple.  Cardiovascular:     Rate and Rhythm: Normal rate. Rhythm irregular.     Heart sounds: No murmur.  Pulmonary:     Effort: Pulmonary effort is normal. No respiratory distress.     Breath sounds: Normal breath sounds. No wheezing or rales.  Abdominal:     General: Abdomen is flat. Bowel sounds are normal. There is no distension.     Palpations: Abdomen is soft.     Tenderness: There is no abdominal tenderness.  Musculoskeletal:     Comments: I was unable to see the wound as it was wrapped by wound care Nurse. She has Mild edema in her Left LE  Skin:    General: Skin is warm and dry.  Neurological:     General: No focal deficit present.     Mental Status: She is alert and oriented to person, place, and time.     Comments: Has Chronic deformity in Right LE.    Psychiatric:        Mood and Affect: Mood normal.        Thought Content: Thought content normal.     Labs reviewed: Basic Metabolic Panel: Recent Labs    02/07/18 0000 08/25/18 0745  NA 137 137  K 4.0 3.7  CL 96* 94*   CO2 30 32  GLUCOSE 94 88  BUN 29* 45*  CREATININE 1.17* 1.67*  CALCIUM 9.4 9.7  TSH 1.81  --    Liver Function Tests: Recent Labs    02/07/18 0000 08/25/18 0745  AST 19 18  ALT 11 12  BILITOT 0.6 0.5  PROT 6.7 6.7   No results for input(s): LIPASE, AMYLASE in the last 8760 hours. No results for input(s): AMMONIA in the last 8760 hours. CBC: Recent Labs    02/07/18 0000  WBC  5.7  HGB 13.7  HCT 39.7  MCV 99.0  PLT 172   Lipid Panel: Recent Labs    02/07/18 0000  CHOL 177  HDL 65  LDLCALC 97  TRIG 65  CHOLHDL 2.7   No results found for: HGBA1C  Procedures since last visit: No results found.  Assessment/Plan Laceration of right lower extremity with Dehiscence Wound now Following with Wound care Clinic and getting dressing at home with Silver alginate and wrap. Repeat Hgb   Essential hypertension BP controlled on HCTZ and Lisinopril Repeat BMP Posterior tibial tendon dysfunction She is mostly Wheelchair Dependent Is independent in her transfers  CKD (chronic kidney disease) stage 3,  Will repeat BMP  RLS (restless legs syndrome) Stable on Requip  Insomnia, with Anxiety Continues on Xanax Knows the risk of Fall in Elderly with Xanax  Macrocytosis Vit B12 normal in Facility   Labs/tests ordered: Follow up Labs in 2 weeks Next appt:  01/01/2019 Follow up for Regular appointment in 4 months unless she needs before

## 2018-12-22 DIAGNOSIS — S81801A Unspecified open wound, right lower leg, initial encounter: Secondary | ICD-10-CM | POA: Diagnosis not present

## 2018-12-22 DIAGNOSIS — I87331 Chronic venous hypertension (idiopathic) with ulcer and inflammation of right lower extremity: Secondary | ICD-10-CM | POA: Diagnosis not present

## 2019-01-01 ENCOUNTER — Other Ambulatory Visit: Payer: PPO

## 2019-01-01 ENCOUNTER — Other Ambulatory Visit: Payer: Self-pay

## 2019-01-01 DIAGNOSIS — L700 Acne vulgaris: Secondary | ICD-10-CM | POA: Diagnosis not present

## 2019-01-01 DIAGNOSIS — L821 Other seborrheic keratosis: Secondary | ICD-10-CM | POA: Diagnosis not present

## 2019-01-01 DIAGNOSIS — T8130XS Disruption of wound, unspecified, sequela: Secondary | ICD-10-CM | POA: Diagnosis not present

## 2019-01-01 DIAGNOSIS — L814 Other melanin hyperpigmentation: Secondary | ICD-10-CM | POA: Diagnosis not present

## 2019-01-01 DIAGNOSIS — I10 Essential (primary) hypertension: Secondary | ICD-10-CM | POA: Diagnosis not present

## 2019-01-01 DIAGNOSIS — L570 Actinic keratosis: Secondary | ICD-10-CM | POA: Diagnosis not present

## 2019-01-01 LAB — CBC WITH DIFFERENTIAL/PLATELET
Absolute Monocytes: 702 cells/uL (ref 200–950)
Basophils Absolute: 49 cells/uL (ref 0–200)
Basophils Relative: 0.8 %
Eosinophils Absolute: 122 cells/uL (ref 15–500)
Eosinophils Relative: 2 %
HCT: 37.8 % (ref 35.0–45.0)
Hemoglobin: 12.8 g/dL (ref 11.7–15.5)
Lymphs Abs: 2135 cells/uL (ref 850–3900)
MCH: 34 pg — ABNORMAL HIGH (ref 27.0–33.0)
MCHC: 33.9 g/dL (ref 32.0–36.0)
MCV: 100.5 fL — ABNORMAL HIGH (ref 80.0–100.0)
MPV: 10.6 fL (ref 7.5–12.5)
Monocytes Relative: 11.5 %
Neutro Abs: 3093 cells/uL (ref 1500–7800)
Neutrophils Relative %: 50.7 %
Platelets: 211 10*3/uL (ref 140–400)
RBC: 3.76 10*6/uL — ABNORMAL LOW (ref 3.80–5.10)
RDW: 13.4 % (ref 11.0–15.0)
Total Lymphocyte: 35 %
WBC: 6.1 10*3/uL (ref 3.8–10.8)

## 2019-01-01 LAB — COMPLETE METABOLIC PANEL WITH GFR
AG Ratio: 1.3 (calc) (ref 1.0–2.5)
ALT: 9 U/L (ref 6–29)
AST: 16 U/L (ref 10–35)
Albumin: 3.7 g/dL (ref 3.6–5.1)
Alkaline phosphatase (APISO): 71 U/L (ref 37–153)
BUN/Creatinine Ratio: 23 (calc) — ABNORMAL HIGH (ref 6–22)
BUN: 27 mg/dL — ABNORMAL HIGH (ref 7–25)
CO2: 31 mmol/L (ref 20–32)
Calcium: 9.2 mg/dL (ref 8.6–10.4)
Chloride: 99 mmol/L (ref 98–110)
Creat: 1.19 mg/dL — ABNORMAL HIGH (ref 0.60–0.88)
GFR, Est African American: 45 mL/min/{1.73_m2} — ABNORMAL LOW (ref >=60)
GFR, Est Non African American: 39 mL/min/{1.73_m2} — ABNORMAL LOW (ref >=60)
GLOBULIN: 2.8 g/dL (ref 1.9–3.7)
Glucose, Bld: 89 mg/dL (ref 65–99)
Potassium: 3.6 mmol/L (ref 3.5–5.3)
Sodium: 139 mmol/L (ref 135–146)
Total Bilirubin: 0.5 mg/dL (ref 0.2–1.2)
Total Protein: 6.5 g/dL (ref 6.1–8.1)

## 2019-01-01 LAB — TSH: TSH: 1.56 mIU/L (ref 0.40–4.50)

## 2019-01-05 ENCOUNTER — Other Ambulatory Visit: Payer: Self-pay

## 2019-01-05 ENCOUNTER — Encounter (HOSPITAL_BASED_OUTPATIENT_CLINIC_OR_DEPARTMENT_OTHER): Payer: PPO | Attending: Internal Medicine

## 2019-01-05 DIAGNOSIS — Z96651 Presence of right artificial knee joint: Secondary | ICD-10-CM | POA: Insufficient documentation

## 2019-01-05 DIAGNOSIS — L03115 Cellulitis of right lower limb: Secondary | ICD-10-CM | POA: Diagnosis not present

## 2019-01-05 DIAGNOSIS — I10 Essential (primary) hypertension: Secondary | ICD-10-CM | POA: Insufficient documentation

## 2019-01-05 DIAGNOSIS — M5030 Other cervical disc degeneration, unspecified cervical region: Secondary | ICD-10-CM | POA: Diagnosis not present

## 2019-01-05 DIAGNOSIS — L97812 Non-pressure chronic ulcer of other part of right lower leg with fat layer exposed: Secondary | ICD-10-CM | POA: Insufficient documentation

## 2019-01-05 DIAGNOSIS — I87331 Chronic venous hypertension (idiopathic) with ulcer and inflammation of right lower extremity: Secondary | ICD-10-CM | POA: Insufficient documentation

## 2019-01-05 DIAGNOSIS — M069 Rheumatoid arthritis, unspecified: Secondary | ICD-10-CM | POA: Diagnosis not present

## 2019-01-05 DIAGNOSIS — M542 Cervicalgia: Secondary | ICD-10-CM | POA: Diagnosis not present

## 2019-01-05 DIAGNOSIS — Z5181 Encounter for therapeutic drug level monitoring: Secondary | ICD-10-CM | POA: Diagnosis not present

## 2019-01-05 DIAGNOSIS — M5136 Other intervertebral disc degeneration, lumbar region: Secondary | ICD-10-CM | POA: Diagnosis not present

## 2019-01-05 DIAGNOSIS — I872 Venous insufficiency (chronic) (peripheral): Secondary | ICD-10-CM | POA: Diagnosis not present

## 2019-01-05 DIAGNOSIS — S81811A Laceration without foreign body, right lower leg, initial encounter: Secondary | ICD-10-CM | POA: Diagnosis not present

## 2019-01-14 ENCOUNTER — Other Ambulatory Visit: Payer: Self-pay | Admitting: Nurse Practitioner

## 2019-01-19 DIAGNOSIS — I87331 Chronic venous hypertension (idiopathic) with ulcer and inflammation of right lower extremity: Secondary | ICD-10-CM | POA: Diagnosis not present

## 2019-01-19 DIAGNOSIS — S81801A Unspecified open wound, right lower leg, initial encounter: Secondary | ICD-10-CM | POA: Diagnosis not present

## 2019-02-02 ENCOUNTER — Encounter (HOSPITAL_BASED_OUTPATIENT_CLINIC_OR_DEPARTMENT_OTHER): Payer: PPO | Attending: Internal Medicine

## 2019-02-02 DIAGNOSIS — L97212 Non-pressure chronic ulcer of right calf with fat layer exposed: Secondary | ICD-10-CM | POA: Insufficient documentation

## 2019-02-02 DIAGNOSIS — S81801A Unspecified open wound, right lower leg, initial encounter: Secondary | ICD-10-CM | POA: Diagnosis not present

## 2019-02-02 DIAGNOSIS — I87331 Chronic venous hypertension (idiopathic) with ulcer and inflammation of right lower extremity: Secondary | ICD-10-CM | POA: Diagnosis not present

## 2019-02-02 DIAGNOSIS — L97812 Non-pressure chronic ulcer of other part of right lower leg with fat layer exposed: Secondary | ICD-10-CM | POA: Diagnosis not present

## 2019-02-16 ENCOUNTER — Other Ambulatory Visit: Payer: Self-pay | Admitting: Nurse Practitioner

## 2019-02-16 DIAGNOSIS — L97212 Non-pressure chronic ulcer of right calf with fat layer exposed: Secondary | ICD-10-CM | POA: Diagnosis not present

## 2019-02-16 DIAGNOSIS — S81811A Laceration without foreign body, right lower leg, initial encounter: Secondary | ICD-10-CM | POA: Diagnosis not present

## 2019-02-16 DIAGNOSIS — I87331 Chronic venous hypertension (idiopathic) with ulcer and inflammation of right lower extremity: Secondary | ICD-10-CM | POA: Diagnosis not present

## 2019-02-16 NOTE — Telephone Encounter (Signed)
Last refill on Xanax 01/14/2019 No access to [facility] Database routed to Marlowe Sax NP for approval

## 2019-03-04 ENCOUNTER — Other Ambulatory Visit: Payer: Self-pay

## 2019-03-04 DIAGNOSIS — N183 Chronic kidney disease, stage 3 unspecified: Secondary | ICD-10-CM

## 2019-03-04 DIAGNOSIS — I10 Essential (primary) hypertension: Secondary | ICD-10-CM

## 2019-03-05 ENCOUNTER — Other Ambulatory Visit: Payer: Self-pay

## 2019-03-05 ENCOUNTER — Other Ambulatory Visit: Payer: PPO

## 2019-03-05 DIAGNOSIS — I10 Essential (primary) hypertension: Secondary | ICD-10-CM | POA: Diagnosis not present

## 2019-03-05 DIAGNOSIS — T8130XS Disruption of wound, unspecified, sequela: Secondary | ICD-10-CM | POA: Diagnosis not present

## 2019-03-06 LAB — COMPLETE METABOLIC PANEL WITH GFR
AG Ratio: 1.4 (calc) (ref 1.0–2.5)
ALT: 8 U/L (ref 6–29)
AST: 17 U/L (ref 10–35)
Albumin: 3.8 g/dL (ref 3.6–5.1)
Alkaline phosphatase (APISO): 66 U/L (ref 37–153)
BUN/Creatinine Ratio: 26 (calc) — ABNORMAL HIGH (ref 6–22)
BUN: 30 mg/dL — ABNORMAL HIGH (ref 7–25)
CO2: 31 mmol/L (ref 20–32)
Calcium: 9.4 mg/dL (ref 8.6–10.4)
Chloride: 99 mmol/L (ref 98–110)
Creat: 1.14 mg/dL — ABNORMAL HIGH (ref 0.60–0.88)
GFR, Est African American: 48 mL/min/{1.73_m2} — ABNORMAL LOW (ref >=60)
GFR, Est Non African American: 41 mL/min/{1.73_m2} — ABNORMAL LOW (ref >=60)
Globulin: 2.8 g/dL (calc) (ref 1.9–3.7)
Glucose, Bld: 84 mg/dL (ref 65–99)
Potassium: 3.4 mmol/L — ABNORMAL LOW (ref 3.5–5.3)
Sodium: 140 mmol/L (ref 135–146)
Total Bilirubin: 0.6 mg/dL (ref 0.2–1.2)
Total Protein: 6.6 g/dL (ref 6.1–8.1)

## 2019-03-06 LAB — CBC WITH DIFFERENTIAL/PLATELET
Absolute Monocytes: 725 cells/uL (ref 200–950)
Basophils Absolute: 38 cells/uL (ref 0–200)
Basophils Relative: 0.6 %
Eosinophils Absolute: 170 cells/uL (ref 15–500)
Eosinophils Relative: 2.7 %
HCT: 38.9 % (ref 35.0–45.0)
Hemoglobin: 13 g/dL (ref 11.7–15.5)
Lymphs Abs: 2155 cells/uL (ref 850–3900)
MCH: 33.5 pg — ABNORMAL HIGH (ref 27.0–33.0)
MCHC: 33.4 g/dL (ref 32.0–36.0)
MCV: 100.3 fL — ABNORMAL HIGH (ref 80.0–100.0)
MPV: 10.7 fL (ref 7.5–12.5)
Monocytes Relative: 11.5 %
Neutro Abs: 3213 cells/uL (ref 1500–7800)
Neutrophils Relative %: 51 %
Platelets: 195 10*3/uL (ref 140–400)
RBC: 3.88 10*6/uL (ref 3.80–5.10)
RDW: 13.9 % (ref 11.0–15.0)
Total Lymphocyte: 34.2 %
WBC: 6.3 10*3/uL (ref 3.8–10.8)

## 2019-03-10 ENCOUNTER — Encounter: Payer: Self-pay | Admitting: Family

## 2019-03-11 ENCOUNTER — Encounter: Payer: Self-pay | Admitting: Internal Medicine

## 2019-03-18 ENCOUNTER — Other Ambulatory Visit: Payer: Self-pay | Admitting: *Deleted

## 2019-03-18 MED ORDER — HYDROCHLOROTHIAZIDE 12.5 MG PO CAPS: ORAL_CAPSULE | ORAL | 0 refills | Status: DC

## 2019-03-18 MED ORDER — ALPRAZOLAM 0.25 MG PO TABS: ORAL_TABLET | ORAL | 0 refills | Status: DC

## 2019-03-18 NOTE — Telephone Encounter (Signed)
Pended Rx and sent to [facility] for approval [facility]

## 2019-04-09 ENCOUNTER — Other Ambulatory Visit: Payer: Self-pay | Admitting: Nurse Practitioner

## 2019-04-09 ENCOUNTER — Telehealth: Payer: Self-pay

## 2019-04-09 MED ORDER — DOXYCYCLINE HYCLATE 100 MG PO TABS: 100.0000 mg | ORAL_TABLET | Freq: Two times a day (BID) | ORAL | 0 refills | Status: AC

## 2019-04-09 NOTE — Telephone Encounter (Signed)
I have talked to Dr Sabra Heck and [facility] has called prescription for Doxycyline for 7 days for presumed Cellulitis. She will come and see me in 1 week in the Clinic

## 2019-04-09 NOTE — Telephone Encounter (Signed)
Patient's son Dr. Sabra Heck  called today at [facility] and requested an appointment to see Dr. Lyndel Safe at [facility] sooner than next Wednesday. Son states patient had received a deep laceration to leg sometime in February and has been monitoring for some time along with trips to Wound Care clinic. Dr. Sabra Heck states his mom's wound had healed completely around the 3rd week of June. However, today he went to check on patient and noticed the wound is red and there is some drainage. He suspects cellulitis and would like to talk with provider as soon as possible. He states he can not wait until next Wednesday for his mom to be seen. Was offered appoitment here in office but he refused. He would like to get antibiotics for the patient today and states he will be going out of town the next few days. Routing to provider. He states contact [phone].

## 2019-04-15 ENCOUNTER — Encounter: Payer: Self-pay | Admitting: Internal Medicine

## 2019-04-15 ENCOUNTER — Non-Acute Institutional Stay: Payer: PPO | Admitting: Internal Medicine

## 2019-04-15 ENCOUNTER — Other Ambulatory Visit: Payer: Self-pay

## 2019-04-15 VITALS — BP 122/76 | HR 74 | Temp 98.0°F | Ht 61.0 in

## 2019-04-15 DIAGNOSIS — L03115 Cellulitis of right lower limb: Secondary | ICD-10-CM | POA: Diagnosis not present

## 2019-04-15 DIAGNOSIS — I10 Essential (primary) hypertension: Secondary | ICD-10-CM | POA: Diagnosis not present

## 2019-04-15 DIAGNOSIS — G2581 Restless legs syndrome: Secondary | ICD-10-CM

## 2019-04-15 DIAGNOSIS — N183 Chronic kidney disease, stage 3 unspecified: Secondary | ICD-10-CM

## 2019-04-15 NOTE — Progress Notes (Signed)
Location:  [place] of Service:  Clinic (12)  Provider:   Code Status:  Goals of Care:  Advanced Directives 04/15/2019  Does Patient Have a Medical Advance Directive? Yes  Type of Advance Directive Out of facility DNR (pink MOST or yellow form)  Does patient want to make changes to medical advance directive? No - Patient declined  Copy of [place] in Chart? -  Would patient like information on creating a medical advance directive? -  Pre-existing out of facility DNR order (yellow form or pink MOST form) Yellow form placed in chart (order not valid for inpatient use);Pink MOST form placed in chart (order not valid for inpatient use)     Chief Complaint  Patient presents with  . Medical Management of Chronic Issues    4 month follow up, right leg swelling, discuss lab results    HPI: Patient is a 85 y.o. female seen today for medical management of chronic diseases.   Patient has h/o Hypertension, Anxiety, Restless leg Syndrome, Insomnia, Arthritis and Posterior Tendon Dysfunction with right foot Valgus in Right Knee with inability to Ambualte She was admitted to SNF for wound care from 02/14-02/25   Patient lives in [place] in [facility].  She is completely wheelchair dependent.  But is independent in her transfers.  Mostly independent in her ADLs.  Do get caregivers few hours a week.  Her son is Dr. Sabra Heck physician Patient is doing good  She did have some Swelling and Redness in her right leg and Dr. Sabra Heck had talked to [facility] and started her on doxycycline.  Today her Right LE looks better. I do not see any signs of infection. She has good pulses in that leg but does have some chronic Arterial changes especially in her toes. She has had no Falls. No New issues.  Past Medical History:  Diagnosis Date  . Anemia   . Hypertension   . OA (osteoarthritis)   . Osteoporosis   . Renal cyst     Past Surgical History:  Procedure Laterality Date   . APPENDECTOMY    . BLADDER SUSPENSION    . CATARACT EXTRACTION    . CESAREAN SECTION    . REPLACEMENT TOTAL KNEE  1995  . TONSILLECTOMY      No Known Allergies  Outpatient Encounter Medications as of 04/15/2019  Medication Sig  . ALPRAZolam (XANAX) 0.25 MG tablet Take one tablet by mouth at bedtime as needed for anxiety or sleep  . Biotin 1000 MCG tablet Take 1,000 mcg by mouth daily.  . cholecalciferol (VITAMIN D3) 25 MCG (1000 UT) tablet Take 1,000 Units by mouth daily.  Marland Kitchen doxycycline (VIBRA-TABS) 100 MG tablet Take 1 tablet (100 mg total) by mouth 2 (two) times daily for 7 days.  Marland Kitchen lisinopril (PRINIVIL,ZESTRIL) 5 MG tablet Take 1 tablet (5 mg total) by mouth 2 (two) times daily.  . metoprolol tartrate (LOPRESSOR) 50 MG tablet TAKE 1 TABLET BY MOUTH TWICE DAILY.  . mirtazapine (REMERON) 7.5 MG tablet Take 1 tablet (7.5 mg total) by mouth at bedtime.  . multivitamin-lutein (OCUVITE-LUTEIN) CAPS capsule Take 1 capsule by mouth daily.  Marland Kitchen rOPINIRole (REQUIP) 1 MG tablet TAKE 1 TABLET EACH EVENING.  . [DISCONTINUED] Amino Acids-Protein Hydrolys (FEEDING SUPPLEMENT, PRO-STAT SUGAR FREE 64,) LIQD Take 30 mLs by mouth 2 (two) times daily with a meal.  . [DISCONTINUED] hydrochlorothiazide (MICROZIDE) 12.5 MG capsule Take two capsules by mouth daily  . [DISCONTINUED] zinc oxide  20 % ointment Apply 1 application topically as needed for irritation. Apply to buttocks, peri topical, after every incontinent episode and as needed for redness,may keep at bedside.   No facility-administered encounter medications on file as of 04/15/2019.     Review of Systems:  Review of Systems  Review of Systems  Constitutional: Negative for activity change, appetite change, chills, diaphoresis, fatigue and fever.  HENT: Negative for mouth sores, postnasal drip, rhinorrhea, sinus pain and sore throat.   Respiratory: Negative for apnea, cough, chest tightness, shortness of breath and wheezing.   Cardiovascular:  Negative for chest pain, palpitations  Gastrointestinal: Negative for abdominal distention, abdominal pain, constipation, diarrhea, nausea and vomiting.  Genitourinary: Negative for dysuria and frequency.  Musculoskeletal: Negative for arthralgias, joint swelling and myalgias.  Skin: Negative for rash.  Neurological: Negative for dizziness, syncope, light-headedness and numbness.  Psychiatric/Behavioral: Negative for behavioral problems, confusion and sleep disturbance.     Health Maintenance  Topic Date Due  . DEXA SCAN  05/16/1989  . INFLUENZA VACCINE  05/02/2019  . TETANUS/TDAP  11/12/2028  . PNA vac Low Risk Adult  Completed    Physical Exam: Vitals:   04/15/19 1354  BP: 122/76  Pulse: 74  Temp: 98 F (36.7 C)  TempSrc: Oral  SpO2: 98%  Height: 5\' 1"  (1.549 m)   Body mass index is 26.68 kg/m. Physical Exam Vitals signs reviewed.  Constitutional:      Appearance: Normal appearance.  HENT:     Head: Normocephalic.     Nose: Nose normal.     Mouth/Throat:     Mouth: Mucous membranes are moist.     Pharynx: Oropharynx is clear.  Eyes:     Pupils: Pupils are equal, round, and reactive to light.  Neck:     Musculoskeletal: Neck supple.  Cardiovascular:     Rate and Rhythm: Normal rate and regular rhythm.     Pulses: Normal pulses.     Heart sounds: Normal heart sounds.  Pulmonary:     Effort: Pulmonary effort is normal.     Breath sounds: Normal breath sounds.  Abdominal:     General: Abdomen is flat. Bowel sounds are normal.     Palpations: Abdomen is soft.  Musculoskeletal:     Comments: Mild swelling in Left LE Right leg has swelling and deformity. Some redness in toes but has good Pulses  Skin:    General: Skin is warm and dry.  Neurological:     Mental Status: She is alert and oriented to person, place, and time.     Comments: Has Chronic deformity in Right LE.    Psychiatric:        Mood and Affect: Mood normal.        Thought Content: Thought  content normal.        Judgment: Judgment normal.     Labs reviewed: Basic Metabolic Panel: Recent Labs    08/25/18 0745 01/01/19 0800 03/05/19 0810  NA 137 139 140  K 3.7 3.6 3.4*  CL 94* 99 99  CO2 32 31 31  GLUCOSE 88 89 84  BUN 45* 27* 30*  CREATININE 1.67* 1.19* 1.14*  CALCIUM 9.7 9.2 9.4  TSH  --  1.56  --    Liver Function Tests: Recent Labs    08/25/18 0745 01/01/19 0800 03/05/19 0810  AST 18 16 17   ALT 12 9 8   BILITOT 0.5 0.5 0.6  PROT 6.7 6.5 6.6   No results for input(s): LIPASE,  AMYLASE in the last 8760 hours. No results for input(s): AMMONIA in the last 8760 hours. CBC: Recent Labs    01/01/19 0800 03/05/19 0810  WBC 6.1 6.3  NEUTROABS 3,093 3,213  HGB 12.8 13.0  HCT 37.8 38.9  MCV 100.5* 100.3*  PLT 211 195   Lipid Panel: No results for input(s): CHOL, HDL, LDLCALC, TRIG, CHOLHDL, LDLDIRECT in the last 8760 hours. No results found for: HGBA1C  Procedures since last visit: No results found.  Assessment/Plan Right LE Cellulitis Almost resolved on Doxycyline I have told its ok not to wear Ted hoses for few weeks till her skin is frail and dry She need to elevate her Leg in between in day to decrease the edema Essential hypertension BP controlled on  Lisinopril and Metoprolol Her Potassium was 3.4 but since she is off her HCTZ   Posterior tibial tendon dysfunction She is mostly Wheelchair Dependent Is independent in her transfers  CKD (chronic kidney disease) stage 3, Repeat BMP   RLS (restless legs syndrome) Stable on Requip  Insomnia,with Anxiety Continues on Xanax and Remeron Knows the risk of Fall in Elderly with Xanax I have told her to try QOD  Macrocytosis Vit B12 normal in Facility   Labs/tests ordered:   Next appt:  Follow up 4 months   Total time spent in this patient care encounter was  25_  minutes; greater than 50% of the visit spent counseling patient and staff, reviewing records , Labs and  coordinating care for problems addressed at this encounter.

## 2019-04-17 ENCOUNTER — Other Ambulatory Visit: Payer: Self-pay | Admitting: Nurse Practitioner

## 2019-04-17 NOTE — Telephone Encounter (Signed)
Patient is requesting refill last refill xanax 6/17 Routed to provider for approval   At last office visit patient stated she was no longer on hydrochlorothiazide so that medication was denied

## 2019-04-20 ENCOUNTER — Telehealth: Payer: Self-pay | Admitting: *Deleted

## 2019-04-20 NOTE — Telephone Encounter (Signed)
Patient called and stated that she needed a refill on her HCTZ. Stated that she has been taking it for a while and it was no longer on her medication list. Patient stated that she is still taking it and wants a refill. Can this be added back to her current medication list and sent to pharmacy. Please Advise.

## 2019-04-21 ENCOUNTER — Other Ambulatory Visit: Payer: Self-pay | Admitting: Nurse Practitioner

## 2019-04-22 ENCOUNTER — Other Ambulatory Visit: Payer: Self-pay | Admitting: Nurse Practitioner

## 2019-04-22 NOTE — Telephone Encounter (Signed)
She told us that she is not taking it anymore. Her Potassium was less and if she wants to restart this she has to take Potassium with it  Let her know that if we renew the prescription she need to come in 2 weeks and get her BMP checked

## 2019-04-22 NOTE — Telephone Encounter (Signed)
Message forwarded to Dr. Lyndel Safe. Please Advise.

## 2019-04-22 NOTE — Telephone Encounter (Signed)
Dr. Alain Honey, Son wants patient to stay on HCTZ also. Can I add back to medication list and send refill to pharmacy. Please Advise.

## 2019-04-22 NOTE — Telephone Encounter (Signed)
Per Dr.Gupta's recent visit 04/15/2019 patient was off HCTZ.please clarify with Dr.Gupta.thanks.

## 2019-04-22 NOTE — Telephone Encounter (Signed)
Patient notified and stated that she will just Discontinue the HCTZ for now. Doesn't want to add another medication. Will call back if any problems.

## 2019-04-28 NOTE — Telephone Encounter (Signed)
Mast, Man X, NP  You 14 minutes ago (4:09 PM)   I agree with her plan, thank you

## 2019-05-04 ENCOUNTER — Other Ambulatory Visit: Payer: Self-pay | Admitting: Nurse Practitioner

## 2019-05-18 ENCOUNTER — Other Ambulatory Visit: Payer: Self-pay | Admitting: Nurse Practitioner

## 2019-06-04 DIAGNOSIS — H5201 Hypermetropia, right eye: Secondary | ICD-10-CM | POA: Diagnosis not present

## 2019-06-04 DIAGNOSIS — H353132 Nonexudative age-related macular degeneration, bilateral, intermediate dry stage: Secondary | ICD-10-CM | POA: Diagnosis not present

## 2019-06-05 ENCOUNTER — Encounter (HOSPITAL_BASED_OUTPATIENT_CLINIC_OR_DEPARTMENT_OTHER): Payer: PPO | Attending: Internal Medicine

## 2019-06-05 DIAGNOSIS — I890 Lymphedema, not elsewhere classified: Secondary | ICD-10-CM | POA: Insufficient documentation

## 2019-06-05 DIAGNOSIS — L97812 Non-pressure chronic ulcer of other part of right lower leg with fat layer exposed: Secondary | ICD-10-CM | POA: Diagnosis not present

## 2019-06-05 DIAGNOSIS — I87331 Chronic venous hypertension (idiopathic) with ulcer and inflammation of right lower extremity: Secondary | ICD-10-CM | POA: Diagnosis not present

## 2019-06-05 DIAGNOSIS — I10 Essential (primary) hypertension: Secondary | ICD-10-CM | POA: Diagnosis not present

## 2019-06-05 DIAGNOSIS — L97811 Non-pressure chronic ulcer of other part of right lower leg limited to breakdown of skin: Secondary | ICD-10-CM | POA: Diagnosis not present

## 2019-06-09 DIAGNOSIS — S81801A Unspecified open wound, right lower leg, initial encounter: Secondary | ICD-10-CM | POA: Diagnosis not present

## 2019-06-18 ENCOUNTER — Other Ambulatory Visit: Payer: Self-pay | Admitting: Family

## 2019-06-19 DIAGNOSIS — I87331 Chronic venous hypertension (idiopathic) with ulcer and inflammation of right lower extremity: Secondary | ICD-10-CM | POA: Diagnosis not present

## 2019-06-19 DIAGNOSIS — L539 Erythematous condition, unspecified: Secondary | ICD-10-CM | POA: Diagnosis not present

## 2019-06-19 DIAGNOSIS — S81811A Laceration without foreign body, right lower leg, initial encounter: Secondary | ICD-10-CM | POA: Diagnosis not present

## 2019-06-21 ENCOUNTER — Other Ambulatory Visit: Payer: Self-pay | Admitting: Nurse Practitioner

## 2019-06-23 NOTE — Telephone Encounter (Signed)
Rx last filled in epic on 05/19/2019. RX sent to Mast, Man X, NP to review [place] database and approve if necessary

## 2019-06-25 ENCOUNTER — Encounter: Payer: PPO | Admitting: Nurse Practitioner

## 2019-06-25 ENCOUNTER — Other Ambulatory Visit: Payer: Self-pay | Admitting: *Deleted

## 2019-06-25 ENCOUNTER — Other Ambulatory Visit: Payer: Self-pay

## 2019-06-25 MED ORDER — AMLODIPINE BESYLATE 2.5 MG PO TABS: 2.5000 mg | ORAL_TABLET | Freq: Every day | ORAL | 2 refills | Status: DC

## 2019-06-25 NOTE — Telephone Encounter (Signed)
Late entry- Called patient's son [name]) regarding her appointment with [facility], I informed him that she was accidentally put on the wrong schedule. He stated that he needed for someone to see her because her BP has been 180/108 and she use to take another BP medication. So I ask him to call [facility] and discuss it with her, and I will reschedule her to be seen by Dr. Lyndel Safe as soon as possible. Patient is scheduled for 07/15/2019 @ 2:30pm.

## 2019-07-06 ENCOUNTER — Encounter (HOSPITAL_BASED_OUTPATIENT_CLINIC_OR_DEPARTMENT_OTHER): Payer: PPO | Admitting: Internal Medicine

## 2019-07-15 ENCOUNTER — Non-Acute Institutional Stay: Payer: PPO | Admitting: Internal Medicine

## 2019-07-15 ENCOUNTER — Other Ambulatory Visit: Payer: Self-pay

## 2019-07-15 ENCOUNTER — Encounter: Payer: Self-pay | Admitting: Internal Medicine

## 2019-07-15 VITALS — BP 118/80 | HR 70 | Temp 97.9°F | Ht 61.0 in | Wt 133.6 lb

## 2019-07-15 DIAGNOSIS — G4700 Insomnia, unspecified: Secondary | ICD-10-CM

## 2019-07-15 DIAGNOSIS — I10 Essential (primary) hypertension: Secondary | ICD-10-CM | POA: Diagnosis not present

## 2019-07-15 DIAGNOSIS — M76821 Posterior tibial tendinitis, right leg: Secondary | ICD-10-CM | POA: Diagnosis not present

## 2019-07-15 DIAGNOSIS — N1832 Chronic kidney disease, stage 3b: Secondary | ICD-10-CM | POA: Diagnosis not present

## 2019-07-15 DIAGNOSIS — D539 Nutritional anemia, unspecified: Secondary | ICD-10-CM

## 2019-07-15 NOTE — Progress Notes (Signed)
Location: [place] of Service:  Clinic (12)  Provider:   Code Status:  Goals of Care:  Advanced Directives 04/15/2019  Does Patient Have a Medical Advance Directive? Yes  Type of Advance Directive Out of facility DNR (pink MOST or yellow form)  Does patient want to make changes to medical advance directive? No - Patient declined  Copy of [place] in Chart? -  Would patient like information on creating a medical advance directive? -  Pre-existing out of facility DNR order (yellow form or pink MOST form) Yellow form placed in chart (order not valid for inpatient use);Pink MOST form placed in chart (order not valid for inpatient use)     Chief Complaint  Patient presents with  . Medical Management of Chronic Issues    Blood pressure. Patient has been monitoring readings at home. Patient states she is taking furosemide prescribed by Dr. Sabra Heck. This was not on her list. She isn't sure if she is taking the metoprolol.  . Health Maintenance    Dexa scan. Patient will get flu shot done today.    HPI: Patient is a 85 y.o. female seen today for an acute visit for follow up for Hypertension  Patient has h/o Hypertension, Anxiety, Restless leg Syndrome, Insomnia, Arthritis and Posterior Tendon Dysfunction with right foot Valgus in Right Knee with inability to Ambulate And a history of right lower extremity hematoma needing wound care.  It is healed now  Her Son is a Engineer, drilling.  She had been running high blood pressure at home and in the wound clinic.  Dr. Sabra Heck had started her on low-dose of amlodipine 2.5 mg.  And also started her on Lasix 20 mg twice a week. Patient did not bring her meds I discussed all this with Dr. Sabra Heck on the Phone.  Patient also brought her blood pressure log and it had few blood pressures which were 150/100. Patient was also little confused about her medications Per Dr. Sabra Heck he had also noticed some confusion and was  worried that patient probably had a small CVA.  He said that he has been filling her medications in Pill box  Patient had a hematoma which she sustained in the 02/20.  Followed by a chronic wound.  Which has healed now and she does not follow with wound care anymore.   Past Medical History:  Diagnosis Date  . Anemia   . Hypertension   . OA (osteoarthritis)   . Osteoporosis   . Renal cyst     Past Surgical History:  Procedure Laterality Date  . APPENDECTOMY    . BLADDER SUSPENSION    . CATARACT EXTRACTION    . CESAREAN SECTION    . REPLACEMENT TOTAL KNEE  1995  . TONSILLECTOMY      No Known Allergies  Outpatient Encounter Medications as of 07/15/2019  Medication Sig  . ALPRAZolam (XANAX) 0.25 MG tablet TAKE 1 TABLET AT BEDTIME AS NEEDED FOR ANXIETY OR SLEEP  . amLODipine (NORVASC) 2.5 MG tablet Take 1 tablet (2.5 mg total) by mouth at bedtime.  . Biotin 1000 MCG tablet Take 1,000 mcg by mouth daily.  . cholecalciferol (VITAMIN D3) 25 MCG (1000 UT) tablet Take 1,000 Units by mouth daily.  . furosemide (LASIX) 20 MG tablet Take 20 mg by mouth. 1 twice a week  . lisinopril (PRINIVIL,ZESTRIL) 5 MG tablet Take 1 tablet (5 mg total) by mouth 2 (two) times daily.  . mirtazapine (REMERON) 7.5 MG  tablet Take 1 tablet (7.5 mg total) by mouth at bedtime.  . multivitamin-lutein (OCUVITE-LUTEIN) CAPS capsule Take 1 capsule by mouth daily.  Marland Kitchen rOPINIRole (REQUIP) 1 MG tablet TAKE 1 TABLET EACH EVENING.  . metoprolol tartrate (LOPRESSOR) 50 MG tablet TAKE 1 TABLET BY MOUTH TWICE DAILY. (Patient not taking: Reported on 07/15/2019)   No facility-administered encounter medications on file as of 07/15/2019.     Review of Systems:  Review of Systems  Review of Systems  Constitutional: Negative for activity change, appetite change, chills, diaphoresis, fatigue and fever.  HENT: Negative for mouth sores, postnasal drip, rhinorrhea, sinus pain and sore throat.   Respiratory: Negative for apnea,  cough, chest tightness, shortness of breath and wheezing.   Cardiovascular: Negative for chest pain, palpitations Gastrointestinal: Negative for abdominal distention, abdominal pain, constipation, diarrhea, nausea and vomiting.  Genitourinary: Negative for dysuria and frequency.  Musculoskeletal: Negative for arthralgias, joint swelling and myalgias.  Skin: Negative for rash.  Neurological: Negative for dizziness, syncope, weakness, light-headedness and numbness.  Psychiatric/Behavioral: Negative for behavioral problems, confusion and sleep disturbance.     Health Maintenance  Topic Date Due  . DEXA SCAN  05/16/1989  . INFLUENZA VACCINE  05/02/2019  . TETANUS/TDAP  11/12/2028  . PNA vac Low Risk Adult  Completed    Physical Exam: Vitals:   07/15/19 1431  BP: 118/80  Pulse: 70  Temp: 97.9 F (36.6 C)  SpO2: 93%  Weight: 133 lb 9.6 oz (60.6 kg)  Height: 5\' 1"  (1.549 m)   Body mass index is 25.24 kg/m. Physical Exam  Constitutional: Oriented to person, place, and time. Well-developed and well-nourished.  HENT:  Head: Normocephalic.  Mouth/Throat: Oropharynx is clear and moist.  Eyes: Pupils are equal, round, and reactive to light.  Neck: Neck supple.  Cardiovascular: Normal rate and normal heart sounds.  No murmur heard. Pulmonary/Chest: Effort normal and breath sounds normal. No respiratory distress. No wheezes. She has no rales.  Abdominal: Soft. Bowel sounds are normal. No distension. There is no tenderness. There is no rebound.  Musculoskeletal: Mild swelling in Left LE Right leg has swelling and deformity Lymphadenopathy: none Neurological: Alert and oriented to person, place, and time. Has Chronic deformity in Right LE.  Skin: Skin is warm and dry.  Psychiatric: Normal mood and affect. Behavior is normal. Thought content normal.    Labs reviewed: Basic Metabolic Panel: Recent Labs    08/25/18 0745 01/01/19 0800 03/05/19 0810  NA 137 139 140  K 3.7 3.6  3.4*  CL 94* 99 99  CO2 32 31 31  GLUCOSE 88 89 84  BUN 45* 27* 30*  CREATININE 1.67* 1.19* 1.14*  CALCIUM 9.7 9.2 9.4  TSH  --  1.56  --    Liver Function Tests: Recent Labs    08/25/18 0745 01/01/19 0800 03/05/19 0810  AST 18 16 17   ALT 12 9 8   BILITOT 0.5 0.5 0.6  PROT 6.7 6.5 6.6   No results for input(s): LIPASE, AMYLASE in the last 8760 hours. No results for input(s): AMMONIA in the last 8760 hours. CBC: Recent Labs    01/01/19 0800 03/05/19 0810  WBC 6.1 6.3  NEUTROABS 3,093 3,213  HGB 12.8 13.0  HCT 37.8 38.9  MCV 100.5* 100.3*  PLT 211 195   Lipid Panel: No results for input(s): CHOL, HDL, LDLCALC, TRIG, CHOLHDL, LDLDIRECT in the last 8760 hours. No results found for: HGBA1C  Procedures since last visit: No results found.  Assessment/Plan Essential hypertension D/W  Dr Sabra Heck Will not increase her Norvasc for now Will continue 2.5 mg She will go to Facility nurse to get her BP checked Q weekly. Will also Continue Lasix 2/week She is also on Low dose of Lisinopril and Metoprolol. Not sure of her Metoprolol Labs in 1 week and Follow up with me in 4 weeks  Confusion with ? CVA and Non Compliance with Meds This can be issue But Dr Sabra Heck will let me know if he thinks she will need Speech Referal Also will consider starting low dose aspirin next visit  Stage 3b chronic kidney disease Repeat BMP  Macrocytic anemia Vit B12 normal in Facility  Posterior tibial tendon dysfunction, right She is mostly Wheelchair Dependent Is independent in her transfers Insomnia, unspecified type Continues on Xanax and Remeron Knows the risk of Fall in Elderly with Xanax I have told her to try QOD    Labs/tests ordered:  * No order type specified * Next appt:  08/17/2019

## 2019-07-23 ENCOUNTER — Encounter: Payer: Self-pay | Admitting: Family

## 2019-07-23 ENCOUNTER — Ambulatory Visit (INDEPENDENT_AMBULATORY_CARE_PROVIDER_SITE_OTHER): Payer: PPO | Admitting: Family

## 2019-07-23 ENCOUNTER — Other Ambulatory Visit: Payer: Self-pay

## 2019-07-23 DIAGNOSIS — I10 Essential (primary) hypertension: Secondary | ICD-10-CM | POA: Diagnosis not present

## 2019-07-23 DIAGNOSIS — Z0000 Encounter for general adult medical examination without abnormal findings: Secondary | ICD-10-CM | POA: Diagnosis not present

## 2019-07-23 DIAGNOSIS — N183 Chronic kidney disease, stage 3 unspecified: Secondary | ICD-10-CM | POA: Diagnosis not present

## 2019-07-23 NOTE — Progress Notes (Signed)
This service is provided via telemedicine  No vital signs collected/recorded due to the encounter was a telemedicine visit.   Location of patient (ex: home, work):  Home   Patient consents to a telephone visit:  Yes  Location of the provider (ex: office, home):  Office   Name of any referring provider:  Man Mast, NP   Names of all persons participating in the telemedicine service and their role in the encounter: Marlowe Sax, NP, Ruthell Rummage CMA, and Glenna Fellows   Time spent on call: Ruthell Rummage CMA, spent 11 minutes on phone with patient

## 2019-07-23 NOTE — Patient Instructions (Addendum)
Ann Jensen , Thank you for taking time to come for your Medicare Wellness Visit. I appreciate your ongoing commitment to your health goals. Please review the following plan we discussed and let me know if I can assist you in the future.   Screening recommendations/referrals: Colonoscopy: N/A  Mammogram: N/A  Bone Density: Declined  Recommended yearly ophthalmology/optometry visit for glaucoma screening and checkup Recommended yearly dental visit for hygiene and checkup  Vaccinations: Influenza vaccine: Up to date  Pneumococcal vaccine : Up to date  Tdap vaccine : Up to date  Shingles vaccine:   Advanced directives: Yes   Conditions/risks identified: Advanced age female > 71 yrs,Hypertension,Sedentary lifestyle   Next appointment: 1 year    Preventive Care 52 Years and Older, Female Preventive care refers to lifestyle choices and visits with your health care provider that can promote health and wellness. What does preventive care include?  A yearly physical exam. This is also called an annual well check.  Dental exams once or twice a year.  Routine eye exams. Ask your health care provider how often you should have your eyes checked.  Personal lifestyle choices, including:  Daily care of your teeth and gums.  Regular physical activity.  Eating a healthy diet.  Avoiding tobacco and drug use.  Limiting alcohol use.  Practicing safe sex.  Taking low-dose aspirin every day.  Taking vitamin and mineral supplements as recommended by your health care provider. What happens during an annual well check? The services and screenings done by your health care provider during your annual well check will depend on your age, overall health, lifestyle risk factors, and family history of disease. Counseling  Your health care provider may ask you questions about your:  Alcohol use.  Tobacco use.  Drug use.  Emotional well-being.  Home and relationship well-being.  Sexual  activity.  Eating habits.  History of falls.  Memory and ability to understand (cognition).  Work and work Statistician.  Reproductive health. Screening  You may have the following tests or measurements:  Height, weight, and BMI.  Blood pressure.  Lipid and cholesterol levels. These may be checked every 5 years, or more frequently if you are over 65 years old.  Skin check.  Lung cancer screening. You may have this screening every year starting at age 23 if you have a 30-pack-year history of smoking and currently smoke or have quit within the past 15 years.  Fecal occult blood test (FOBT) of the stool. You may have this test every year starting at age 37.  Flexible sigmoidoscopy or colonoscopy. You may have a sigmoidoscopy every 5 years or a colonoscopy every 10 years starting at age 83.  Hepatitis C blood test.  Hepatitis B blood test.  Sexually transmitted disease (STD) testing.  Diabetes screening. This is done by checking your blood sugar (glucose) after you have not eaten for a while (fasting). You may have this done every 1-3 years.  Bone density scan. This is done to screen for osteoporosis. You may have this done starting at age 14.  Mammogram. This may be done every 1-2 years. Talk to your health care provider about how often you should have regular mammograms. Talk with your health care provider about your test results, treatment options, and if necessary, the need for more tests. Vaccines  Your health care provider may recommend certain vaccines, such as:  Influenza vaccine. This is recommended every year.  Tetanus, diphtheria, and acellular pertussis (Tdap, Td) vaccine. You may need a  Td booster every 10 years.  Zoster vaccine. You may need this after age 50.  Pneumococcal 13-valent conjugate (PCV13) vaccine. One dose is recommended after age 39.  Pneumococcal polysaccharide (PPSV23) vaccine. One dose is recommended after age 58. Talk to your health care  provider about which screenings and vaccines you need and how often you need them. This information is not intended to replace advice given to you by your health care provider. Make sure you discuss any questions you have with your health care provider. Document Released: 10/14/2015 Document Revised: 06/06/2016 Document Reviewed: 07/19/2015 Elsevier Interactive Patient Education  [address] Prevention in the Home Falls can cause injuries. They can happen to people of all ages. There are many things you can do to make your home safe and to help prevent falls. What can I do on the outside of my home?  Regularly fix the edges of walkways and driveways and fix any cracks.  Remove anything that might make you trip as you walk through a door, such as a raised step or threshold.  Trim any bushes or trees on the path to your home.  Use bright outdoor lighting.  Clear any walking paths of anything that might make someone trip, such as rocks or tools.  Regularly check to see if handrails are loose or broken. Make sure that both sides of any steps have handrails.  Any raised decks and porches should have guardrails on the edges.  Have any leaves, snow, or ice cleared regularly.  Use sand or salt on walking paths during winter.  Clean up any spills in your garage right away. This includes oil or grease spills. What can I do in the bathroom?  Use night lights.  Install grab bars by the toilet and in the tub and shower. Do not use towel bars as grab bars.  Use non-skid mats or decals in the tub or shower.  If you need to sit down in the shower, use a plastic, non-slip stool.  Keep the floor dry. Clean up any water that spills on the floor as soon as it happens.  Remove soap buildup in the tub or shower regularly.  Attach bath mats securely with double-sided non-slip rug tape.  Do not have throw rugs and other things on the floor that can make you trip. What can I do in  the bedroom?  Use night lights.  Make sure that you have a light by your bed that is easy to reach.  Do not use any sheets or blankets that are too big for your bed. They should not hang down onto the floor.  Have a firm chair that has side arms. You can use this for support while you get dressed.  Do not have throw rugs and other things on the floor that can make you trip. What can I do in the kitchen?  Clean up any spills right away.  Avoid walking on wet floors.  Keep items that you use a lot in easy-to-reach places.  If you need to reach something above you, use a strong step stool that has a grab bar.  Keep electrical cords out of the way.  Do not use floor polish or wax that makes floors slippery. If you must use wax, use non-skid floor wax.  Do not have throw rugs and other things on the floor that can make you trip. What can I do with my stairs?  Do not leave any items on the stairs.  Make sure that there are handrails on both sides of the stairs and use them. Fix handrails that are broken or loose. Make sure that handrails are as long as the stairways.  Check any carpeting to make sure that it is firmly attached to the stairs. Fix any carpet that is loose or worn.  Avoid having throw rugs at the top or bottom of the stairs. If you do have throw rugs, attach them to the floor with carpet tape.  Make sure that you have a light switch at the top of the stairs and the bottom of the stairs. If you do not have them, ask someone to add them for you. What else can I do to help prevent falls?  Wear shoes that:  Do not have high heels.  Have rubber bottoms.  Are comfortable and fit you well.  Are closed at the toe. Do not wear sandals.  If you use a stepladder:  Make sure that it is fully opened. Do not climb a closed stepladder.  Make sure that both sides of the stepladder are locked into place.  Ask someone to hold it for you, if possible.  Clearly mark and  make sure that you can see:  Any grab bars or handrails.  First and last steps.  Where the edge of each step is.  Use tools that help you move around (mobility aids) if they are needed. These include:  Canes.  Walkers.  Scooters.  Crutches.  Turn on the lights when you go into a dark area. Replace any light bulbs as soon as they burn out.  Set up your furniture so you have a clear path. Avoid moving your furniture around.  If any of your floors are uneven, fix them.  If there are any pets around you, be aware of where they are.  Review your medicines with your doctor. Some medicines can make you feel dizzy. This can increase your chance of falling. Ask your doctor what other things that you can do to help prevent falls. This information is not intended to replace advice given to you by your health care provider. Make sure you discuss any questions you have with your health care provider. Document Released: 07/14/2009 Document Revised: 02/23/2016 Document Reviewed: 10/22/2014 Elsevier Interactive Patient Education  2017 [employer].

## 2019-07-23 NOTE — Progress Notes (Signed)
Subjective:   Ann Jensen is a 85 y.o. female who presents for Medicare Annual (Subsequent) preventive examination.  Review of Systems:  Cardiac Risk Factors include: advanced age (>23men, >22 women);hypertension;sedentary lifestyle     Objective:     Vitals: There were no vitals taken for this visit.  There is no height or weight on file to calculate BMI.  Advanced Directives 04/15/2019 12/17/2018 11/14/2018 09/02/2018 05/21/2018 05/06/2018 01/01/2018  Does Patient Have a Medical Advance Directive? Yes No Yes Yes Yes Yes Yes  Type of Advance Directive Out of facility DNR (pink MOST or yellow form) Out of facility DNR (pink MOST or yellow form) Out of facility DNR (pink MOST or yellow form) Out of facility DNR (pink MOST or yellow form) Out of facility DNR (pink MOST or yellow form) [place];Living will [place];Living will;Out of facility DNR (pink MOST or yellow form)  Does patient want to make changes to medical advance directive? No - Patient declined No - Patient declined No - Patient declined No - Patient declined No - Patient declined Yes (Inpatient - patient defers changing a medical advance directive at this time) No - Patient declined  Copy of [place] in Chart? - - - - - Yes Yes  Would patient like information on creating a medical advance directive? - No - Patient declined - - - - -  Pre-existing out of facility DNR order (yellow form or pink MOST form) Yellow form placed in chart (order not valid for inpatient use);Pink MOST form placed in chart (order not valid for inpatient use) Pink MOST form placed in chart (order not valid for inpatient use);Yellow form placed in chart (order not valid for inpatient use) Yellow form placed in chart (order not valid for inpatient use) Yellow form placed in chart (order not valid for inpatient use) Yellow form placed in chart (order not valid for inpatient use) - Yellow form placed in chart  (order not valid for inpatient use)    Tobacco Social History   Tobacco Use  Smoking Status Former Smoker   Types: E-cigarettes  Smokeless Tobacco Never Used  Tobacco Comment   when she was 85 years old     Counseling given: Not Answered Comment: when she was 85 years old   Clinical Intake:  Pre-visit preparation completed: No  Pain : No/denies pain     BMI - recorded: 25.24 Nutritional Status: BMI 25 -29 Overweight Nutritional Risks: None Diabetes: No  How often do you need to have someone help you when you read instructions, pamphlets, or other written materials from your doctor or pharmacy?: 1 - Never What is the last grade level you completed in school?: College  Interpreter Needed?: No  Information entered by :: Shemicka Cohrs FNP-C  Past Medical History:  Diagnosis Date   Anemia    Hypertension    OA (osteoarthritis)    Osteoporosis    Renal cyst    Past Surgical History:  Procedure Laterality Date   APPENDECTOMY     BLADDER SUSPENSION     CATARACT EXTRACTION     CESAREAN SECTION     REPLACEMENT TOTAL KNEE  1995   TONSILLECTOMY     Family History  Problem Relation Age of Onset   Colon cancer Mother    Arthritis Mother    Heart disease Father    Heart attack Father    Migraines Sister    Allergies Sister    Asthma Sister  Social History   Socioeconomic History   Marital status: Widowed    Spouse name: Not on file   Number of children: 2   Years of education: 3   Highest education level: Not on file  Occupational History   Occupation: Retired Editor, commissioning strain: Not hard at [employer] insecurity    Worry: Never true    Inability: Never true   Transportation needs    Medical: No    Non-medical: No  Tobacco Use   Smoking status: Former Smoker    Types: E-cigarettes   Smokeless tobacco: Never Used   Tobacco comment: when she was 85 years old  Substance and Sexual  Activity   Alcohol use: No   Drug use: No   Sexual activity: Never    Birth control/protection: None  Lifestyle   Physical activity    Days per week: 0 days    Minutes per session: 0 min   Stress: Not at all  Relationships   Social connections    Talks on phone: More than three times a week    Gets together: More than three times a week    Attends religious service: Never    Active member of club or organization: No    Attends meetings of clubs or organizations: Never    Relationship status: Widowed  Other Topics Concern   Not on file  Social History Narrative   Coffee-with Caffeine   Widow   2 sons   No pets   Retired Therapist, sports   No exercise   HCPOA, Living Will, No DNR   + glasses   + hearing aids    Outpatient Encounter Medications as of 07/23/2019  Medication Sig   ALPRAZolam (XANAX) 0.25 MG tablet TAKE 1 TABLET AT BEDTIME AS NEEDED FOR ANXIETY OR SLEEP   amLODipine (NORVASC) 2.5 MG tablet Take 1 tablet (2.5 mg total) by mouth at bedtime.   Biotin 1000 MCG tablet Take 1,000 mcg by mouth daily.   cholecalciferol (VITAMIN D3) 25 MCG (1000 UT) tablet Take 1,000 Units by mouth daily.   furosemide (LASIX) 20 MG tablet Take 20 mg by mouth. Twice weekly   lisinopril (PRINIVIL,ZESTRIL) 5 MG tablet Take 1 tablet (5 mg total) by mouth 2 (two) times daily.   metoprolol tartrate (LOPRESSOR) 50 MG tablet TAKE 1 TABLET BY MOUTH TWICE DAILY.   mirtazapine (REMERON) 7.5 MG tablet Take 1 tablet (7.5 mg total) by mouth at bedtime.   multivitamin-lutein (OCUVITE-LUTEIN) CAPS capsule Take 1 capsule by mouth daily.   rOPINIRole (REQUIP) 1 MG tablet TAKE 1 TABLET EACH EVENING.   No facility-administered encounter medications on file as of 07/23/2019.     Activities of Daily Living In your present state of health, do you have any difficulty performing the following activities: 07/23/2019  Hearing? Y  Comment wears hearing aid  Vision? Y  Comment wears eye glasses    Difficulty concentrating or making decisions? Y  Comment Remembering  Walking or climbing stairs? Y  Comment uses scooter  Dressing or bathing? Y  Comment needs assistance  Doing errands, shopping? Y  Comment needs assistance with Copywriter, advertising and eating ? Y  Comment lives in [place] meals provided  Using the Toilet? N  In the past six months, have you accidently leaked urine? Y  Comment wears pads  Do you have problems with loss of bowel control? N  Managing your Medications? Y  Comment son assist  Managing your Finances? N  Housekeeping or managing your Housekeeping? Y  Comment has assistance  Some recent data might be hidden    Patient Care Team: Mast, Man X, NP as PCP - General (Internal Medicine) Wardell Honour, MD as Attending Physician (Family Medicine)    Assessment:   This is a routine wellness examination for Branna.  Exercise Activities and Dietary recommendations Current Exercise Habits: The patient does not participate in regular exercise at present  Goals   None     Fall Risk Fall Risk  07/23/2019 07/15/2019 04/15/2019 09/02/2018 01/01/2018  Falls in the past year? 0 0 0 0 No  Number falls in past yr: 0 0 0 0 -  Injury with Fall? 0 - 0 0 -   Is the patient's home free of loose throw rugs in walkways, pet beds, electrical cords, etc?   no      Grab bars in the bathroom? yes      Handrails on the stairs?   no steps       Adequate lighting?   yes  Depression Screen PHQ 2/9 Scores 07/23/2019 05/06/2018 01/01/2018 07/22/2017  PHQ - 2 Score 0 0 0 0     Cognitive Function MMSE - Mini Mental State Exam 01/01/2018  Orientation to time 5  Orientation to Place 5  Registration 3  Attention/ Calculation 4  Recall 1  Language- name 2 objects 2  Language- repeat 1  Language- follow 3 step command 3  Language- read & follow direction 1  Write a sentence 1  Copy design 0  Total score 26     6CIT Screen 07/23/2019  What Year? 0 points  What  month? 0 points  What time? 0 points  Count back from 20 0 points  Months in reverse 0 points  Repeat phrase 4 points  Total Score 4    Immunization History  Administered Date(s) Administered   Influenza, High Dose Seasonal PF 07/10/2017   Influenza,inj,Quad PF,6+ Mos 07/03/2018   Influenza-Unspecified 07/10/2017   Pneumococcal Conjugate-13 08/18/2014   Pneumococcal Polysaccharide-23 07/26/2010   Tdap 11/12/2018    Qualifies for Shingles Vaccine? Declined   Screening Tests Health Maintenance  Topic Date Due   DEXA SCAN  05/16/1989   INFLUENZA VACCINE  05/02/2019   TETANUS/TDAP  11/12/2028   PNA vac Low Risk Adult  Completed    Cancer Screenings: Lung: Low Dose CT Chest recommended if Age 22-80 years, 30 pack-year currently smoking OR have quit w/in 15years. Patient does not qualify. Breast:  Up to date on Mammogram? No  Aged out  Up to date of Bone Density/Dexa? No Colorectal: Age out   Additional Screenings: Hepatitis C Screening: Low Risk      Plan:    I have personally reviewed and noted the following in the patients chart:    Medical and social history  Use of alcohol, tobacco or illicit drugs   Current medications and supplements  Functional ability and status  Nutritional status  Physical activity  Advanced directives  List of other physicians  Hospitalizations, surgeries, and ER visits in previous 12 months  Vitals  Screenings to include cognitive, depression, and falls  Referrals and appointments  In addition, I have reviewed and discussed with patient certain preventive protocols, quality metrics, and best practice recommendations. A written personalized care plan for preventive services as well as general preventive health recommendations were provided to patient.  Sandrea Hughs, NP  07/23/2019

## 2019-07-24 ENCOUNTER — Other Ambulatory Visit: Payer: Self-pay | Admitting: Nurse Practitioner

## 2019-07-24 LAB — LIPID PANEL
Cholesterol: 175 mg/dL (ref <200)
HDL: 54 mg/dL (ref >=50)
LDL Cholesterol (Calc): 105 mg/dL (calc) — ABNORMAL HIGH
Non-HDL Cholesterol (Calc): 121 mg/dL (calc) (ref <130)
Total CHOL/HDL Ratio: 3.2 (calc) (ref <5.0)
Triglycerides: 74 mg/dL (ref <150)

## 2019-07-24 LAB — COMPLETE METABOLIC PANEL WITH GFR
AG Ratio: 1.2 (calc) (ref 1.0–2.5)
ALT: 9 U/L (ref 6–29)
AST: 17 U/L (ref 10–35)
Albumin: 3.7 g/dL (ref 3.6–5.1)
Alkaline phosphatase (APISO): 63 U/L (ref 37–153)
BUN/Creatinine Ratio: 26 (calc) — ABNORMAL HIGH (ref 6–22)
BUN: 29 mg/dL — ABNORMAL HIGH (ref 7–25)
CO2: 28 mmol/L (ref 20–32)
Calcium: 9.4 mg/dL (ref 8.6–10.4)
Chloride: 102 mmol/L (ref 98–110)
Creat: 1.12 mg/dL — ABNORMAL HIGH (ref 0.60–0.88)
GFR, Est African American: 48 mL/min/{1.73_m2} — ABNORMAL LOW (ref >=60)
GFR, Est Non African American: 42 mL/min/{1.73_m2} — ABNORMAL LOW (ref >=60)
Globulin: 3.1 g/dL (calc) (ref 1.9–3.7)
Glucose, Bld: 78 mg/dL (ref 65–99)
Potassium: 3.9 mmol/L (ref 3.5–5.3)
Sodium: 143 mmol/L (ref 135–146)
Total Bilirubin: 0.7 mg/dL (ref 0.2–1.2)
Total Protein: 6.8 g/dL (ref 6.1–8.1)

## 2019-07-24 LAB — CBC WITH DIFFERENTIAL/PLATELET
Absolute Monocytes: 655 cells/uL (ref 200–950)
Basophils Absolute: 50 cells/uL (ref 0–200)
Basophils Relative: 0.9 %
Eosinophils Absolute: 123 cells/uL (ref 15–500)
Eosinophils Relative: 2.2 %
HCT: 38.4 % (ref 35.0–45.0)
Hemoglobin: 12.8 g/dL (ref 11.7–15.5)
Lymphs Abs: 2072 cells/uL (ref 850–3900)
MCH: 34 pg — ABNORMAL HIGH (ref 27.0–33.0)
MCHC: 33.3 g/dL (ref 32.0–36.0)
MCV: 101.9 fL — ABNORMAL HIGH (ref 80.0–100.0)
MPV: 10.9 fL (ref 7.5–12.5)
Monocytes Relative: 11.7 %
Neutro Abs: 2699 cells/uL (ref 1500–7800)
Neutrophils Relative %: 48.2 %
Platelets: 176 10*3/uL (ref 140–400)
RBC: 3.77 10*6/uL — ABNORMAL LOW (ref 3.80–5.10)
RDW: 13.6 % (ref 11.0–15.0)
Total Lymphocyte: 37 %
WBC: 5.6 10*3/uL (ref 3.8–10.8)

## 2019-07-24 LAB — TSH: TSH: 1.54 mIU/L (ref 0.40–4.50)

## 2019-07-27 ENCOUNTER — Other Ambulatory Visit: Payer: Self-pay | Admitting: Nurse Practitioner

## 2019-07-31 ENCOUNTER — Other Ambulatory Visit: Payer: Self-pay | Admitting: Nurse Practitioner

## 2019-07-31 NOTE — Telephone Encounter (Signed)
Last filled in epic on 06/23/2019, RX request sent to Mast, Man X, NP to review [place] Database and approve if necessary

## 2019-08-03 ENCOUNTER — Other Ambulatory Visit: Payer: Self-pay | Admitting: Nurse Practitioner

## 2019-08-11 ENCOUNTER — Telehealth: Payer: Self-pay | Admitting: *Deleted

## 2019-08-11 NOTE — Telephone Encounter (Signed)
She has appointment with me tomorrow. I will see her and will increase her Norvasc if needed

## 2019-08-11 NOTE — Telephone Encounter (Signed)
Dr. Teodoro Spray, Son called and stated that patient's blood pressure Amlodipine 2.5mg  is not controlling patient's blood pressure. Has monitored. Wants it increased to 5mg  daily and Rx sent to [facility]. Please Advise.

## 2019-08-12 ENCOUNTER — Non-Acute Institutional Stay: Payer: PPO | Admitting: Internal Medicine

## 2019-08-12 ENCOUNTER — Other Ambulatory Visit: Payer: Self-pay

## 2019-08-12 ENCOUNTER — Encounter: Payer: Self-pay | Admitting: Internal Medicine

## 2019-08-12 VITALS — BP 126/80 | HR 80 | Temp 97.8°F | Ht 61.0 in | Wt 135.2 lb

## 2019-08-12 DIAGNOSIS — M76821 Posterior tibial tendinitis, right leg: Secondary | ICD-10-CM | POA: Diagnosis not present

## 2019-08-12 DIAGNOSIS — I10 Essential (primary) hypertension: Secondary | ICD-10-CM

## 2019-08-12 DIAGNOSIS — N1832 Chronic kidney disease, stage 3b: Secondary | ICD-10-CM | POA: Diagnosis not present

## 2019-08-12 DIAGNOSIS — G2581 Restless legs syndrome: Secondary | ICD-10-CM | POA: Diagnosis not present

## 2019-08-12 DIAGNOSIS — G4700 Insomnia, unspecified: Secondary | ICD-10-CM

## 2019-08-12 DIAGNOSIS — E785 Hyperlipidemia, unspecified: Secondary | ICD-10-CM | POA: Diagnosis not present

## 2019-08-12 NOTE — Progress Notes (Signed)
Location: [place] of Service:  Clinic (12)  Provider:   Code Status:   Goals of Care:  Advanced Directives 04/15/2019  Does Patient Have a Medical Advance Directive? Yes  Type of Advance Directive Out of facility DNR (pink MOST or yellow form)  Does patient want to make changes to medical advance directive? No - Patient declined  Copy of [place] in Chart? -  Would patient like information on creating a medical advance directive? -  Pre-existing out of facility DNR order (yellow form or pink MOST form) Yellow form placed in chart (order not valid for inpatient use);Pink MOST form placed in chart (order not valid for inpatient use)     Chief Complaint  Patient presents with  . Medical Management of Chronic Issues    4 week follow up. Patient has been recording blood pressures and would like medications reviewed. She is not able to go over them and would like her son to be called to discuss. She would like her right leg looked at. Its swollen and painful from the knee down.     HPI: Patient is a 85 y.o. female seen today for an acute visit for Hypertension  Patient has h/o Hypertension, Anxiety, Restless leg Syndrome, Insomnia, Arthritis and Posterior Tendon Dysfunction with right foot Valgus in Right Knee with inability to Ambulate And a history of right lower extremity hematoma needing wound care.  It is healed now Her son Dr Sabra Heck had  her started on Norvasc and then increase the dose as her blood pressure is still high.  Now patient is on 5 mg of Norvasc and her blood pressure looks good today Right Leg hematoma Completely healed LE Edma Takes PRN Lasix Mild Cognitive impairment Repeats herself a lot There was some concern for TIA per Dr Sabra Heck. Has started on Aspirin baby QD  Has gained some weight no Cough or chest Pain  Past Medical History:  Diagnosis Date  . Anemia   . Hypertension   . OA (osteoarthritis)   . Osteoporosis    . Renal cyst     Past Surgical History:  Procedure Laterality Date  . APPENDECTOMY    . BLADDER SUSPENSION    . CATARACT EXTRACTION    . CESAREAN SECTION    . REPLACEMENT TOTAL KNEE  1995  . TONSILLECTOMY      No Known Allergies  Outpatient Encounter Medications as of 08/12/2019  Medication Sig  . ALPRAZolam (XANAX) 0.25 MG tablet TAKE 1 TABLET AT BEDTIME AS NEEDED FOR ANXIETY OR SLEEP  . amLODipine (NORVASC) 2.5 MG tablet Take 1 tablet (2.5 mg total) by mouth at bedtime.  . Biotin 1000 MCG tablet Take 1,000 mcg by mouth daily.  . cholecalciferol (VITAMIN D3) 25 MCG (1000 UT) tablet Take 1,000 Units by mouth daily.  . furosemide (LASIX) 20 MG tablet Take 20 mg by mouth. Twice weekly  . lisinopril (PRINIVIL,ZESTRIL) 5 MG tablet Take 1 tablet (5 mg total) by mouth 2 (two) times daily.  . metoprolol tartrate (LOPRESSOR) 50 MG tablet TAKE 1 TABLET BY MOUTH TWICE DAILY.  . mirtazapine (REMERON) 7.5 MG tablet Take 1 tablet (7.5 mg total) by mouth at bedtime.  . multivitamin-lutein (OCUVITE-LUTEIN) CAPS capsule Take 1 capsule by mouth daily.  Marland Kitchen rOPINIRole (REQUIP) 1 MG tablet TAKE 1 TABLET EACH EVENING.   No facility-administered encounter medications on file as of 08/12/2019.     Review of Systems:  Review of Systems  Review  of Systems  Constitutional: Negative for activity change, appetite change, chills, diaphoresis, fatigue and fever.  HENT: Negative for mouth sores, postnasal drip, rhinorrhea, sinus pain and sore throat.   Respiratory: Negative for apnea, cough, chest tightness, shortness of breath and wheezing.   Cardiovascular: Negative for chest pain, palpitations and leg swelling.  Gastrointestinal: Negative for abdominal distention, abdominal pain, constipation, diarrhea, nausea and vomiting.  Genitourinary: Negative for dysuria and frequency.  Musculoskeletal: Negative for arthralgias, joint swelling and myalgias.  Skin: Negative for rash.  Neurological: Negative for  dizziness, syncope, weakness, light-headedness and numbness.  Psychiatric/Behavioral: Negative for behavioral problems, confusion and sleep disturbance.     Health Maintenance  Topic Date Due  . DEXA SCAN  07/22/2020 (Originally 05/16/1989)  . TETANUS/TDAP  11/12/2028  . INFLUENZA VACCINE  Completed  . PNA vac Low Risk Adult  Completed    Physical Exam: Vitals:   08/12/19 1452  BP: 126/80  Pulse: 80  Temp: 97.8 F (36.6 C)  SpO2: (!) 86% Repeat was 93%  Weight: 135 lb 3.2 oz (61.3 kg)  Height: 5\' 1"  (1.549 m)   Body mass index is 25.55 kg/m. Physical Exam  Constitutional: Oriented to person, place, and time. Well-developed and well-nourished.  HENT:  Head: Normocephalic.  Mouth/Throat: Oropharynx is clear and moist.  Eyes: Pupils are equal, round, and reactive to light.  Neck: Neck supple.  Cardiovascular: Normal rate and normal heart sounds.  No murmur heard. Pulmonary/Chest: Effort normal and breath sounds normal. No respiratory distress. No wheezes. She has no rales.  Abdominal: Soft. Bowel sounds are normal. No distension. There is no tenderness. There is no rebound.  Musculoskeletal: Mild edema Bilateral Some chronic Venous changes in Right LE Lymphadenopathy: none Neurological: Alert and oriented to person, place, and time.  Skin: Skin is warm and dry.  Psychiatric: Normal mood and affect. Behavior is normal. Thought content normal.    Labs reviewed: Basic Metabolic Panel: Recent Labs    01/01/19 0800 03/05/19 0810 07/23/19 0000  NA 139 140 143  K 3.6 3.4* 3.9  CL 99 99 102  CO2 31 31 28   GLUCOSE 89 84 78  BUN 27* 30* 29*  CREATININE 1.19* 1.14* 1.12*  CALCIUM 9.2 9.4 9.4  TSH 1.56  --  1.54   Liver Function Tests: Recent Labs    01/01/19 0800 03/05/19 0810 07/23/19 0000  AST 16 17 17   ALT 9 8 9   BILITOT 0.5 0.6 0.7  PROT 6.5 6.6 6.8   No results for input(s): LIPASE, AMYLASE in the last 8760 hours. No results for input(s): AMMONIA in the  last 8760 hours. CBC: Recent Labs    01/01/19 0800 03/05/19 0810 07/23/19 0000  WBC 6.1 6.3 5.6  NEUTROABS 3,093 3,213 2,699  HGB 12.8 13.0 12.8  HCT 37.8 38.9 38.4  MCV 100.5* 100.3* 101.9*  PLT 211 195 176   Lipid Panel: Recent Labs    07/23/19 0000  CHOL 175  HDL 54  LDLCALC 105*  TRIG 74  CHOLHDL 3.2   No results found for: HGBA1C  Procedures since last visit: No results found.  Assessment/Plan  Essential hypertension D/W Dr Sabra Heck Her Norvasc is now 5 mg BP controlled Also on metoprolol  Confusion with ? CVA and Non Compliance with Meds Will take Baby dose of aspirin  Stage 3b chronic kidney disease Repeat BMPshowed Creat Stable Macrocytosis Vit B12 normal in Facility No Anemia  LE edema Takes Lasix 2/week  Posterior tibial tendon dysfunction, right She is  mostly Wheelchair Dependent Is independent in her transfers  Insomnia, unspecified type Continues on Xanax Takes Remeron PRN. Hyperlipidemia LDL slightly high. D/W Patient about Diet control Follow up in 2 months with her BP log  Labs/tests ordered:  * No order type specified * Next appt:  Visit date not found Total time spent in this patient care encounter was  25_  minutes; greater than 50% of the visit spent counseling patient and staff, reviewing records , Labs and coordinating care for problems addressed at this encounter.

## 2019-08-13 ENCOUNTER — Other Ambulatory Visit: Payer: Self-pay | Admitting: *Deleted

## 2019-08-13 MED ORDER — AMLODIPINE BESYLATE 5 MG PO TABS: 5.0000 mg | ORAL_TABLET | Freq: Every day | ORAL | 1 refills | Status: DC

## 2019-08-13 NOTE — Telephone Encounter (Signed)
[facility] called requesting refill.

## 2019-08-17 ENCOUNTER — Other Ambulatory Visit: Payer: Self-pay

## 2019-08-19 ENCOUNTER — Encounter: Payer: Self-pay | Admitting: Internal Medicine

## 2019-08-31 ENCOUNTER — Other Ambulatory Visit: Payer: Self-pay | Admitting: Internal Medicine

## 2019-08-31 ENCOUNTER — Telehealth: Payer: Self-pay

## 2019-08-31 MED ORDER — GABAPENTIN 100 MG PO CAPS: 100.0000 mg | ORAL_CAPSULE | Freq: Three times a day (TID) | ORAL | 0 refills | Status: DC

## 2019-08-31 NOTE — Telephone Encounter (Signed)
D/W Dr Sabra Heck. He said his Mom has been c/o Lower Back Pain Radiating down to her Legs for past few days.No h/o Falls. They at this time do not want any aggressive Imaging due to her limited Mobility. Just want something to help her Pain. We discussed different treatment options. At this time will start her Neurontin 100 mg TID . Reval in few weeks to see if it helps her pain. She also needs Appointment to come to see me for follow up. Dr Sabra Heck is also going to look into Assisted Living for her

## 2019-08-31 NOTE — Telephone Encounter (Signed)
Patient's son (Dr.Spatafore) called requesting a return call from [place]. No information was disclosed as to what the call was in reference to.

## 2019-09-02 DIAGNOSIS — M79661 Pain in right lower leg: Secondary | ICD-10-CM | POA: Diagnosis not present

## 2019-09-02 DIAGNOSIS — Z03818 Encounter for observation for suspected exposure to other biological agents ruled out: Secondary | ICD-10-CM | POA: Diagnosis not present

## 2019-09-02 DIAGNOSIS — M79651 Pain in right thigh: Secondary | ICD-10-CM | POA: Diagnosis not present

## 2019-09-03 ENCOUNTER — Encounter: Payer: Self-pay | Admitting: Nurse Practitioner

## 2019-09-03 ENCOUNTER — Non-Acute Institutional Stay (SKILLED_NURSING_FACILITY): Payer: PPO | Admitting: Nurse Practitioner

## 2019-09-03 DIAGNOSIS — I10 Essential (primary) hypertension: Secondary | ICD-10-CM

## 2019-09-03 DIAGNOSIS — S8001XD Contusion of right knee, subsequent encounter: Secondary | ICD-10-CM

## 2019-09-03 DIAGNOSIS — M76819 Anterior tibial syndrome, unspecified leg: Secondary | ICD-10-CM | POA: Diagnosis not present

## 2019-09-03 DIAGNOSIS — M6281 Muscle weakness (generalized): Secondary | ICD-10-CM | POA: Diagnosis not present

## 2019-09-03 DIAGNOSIS — W19XXXD Unspecified fall, subsequent encounter: Secondary | ICD-10-CM | POA: Diagnosis not present

## 2019-09-03 DIAGNOSIS — I739 Peripheral vascular disease, unspecified: Secondary | ICD-10-CM

## 2019-09-03 DIAGNOSIS — G4700 Insomnia, unspecified: Secondary | ICD-10-CM | POA: Diagnosis not present

## 2019-09-03 DIAGNOSIS — R531 Weakness: Secondary | ICD-10-CM

## 2019-09-03 DIAGNOSIS — F5105 Insomnia due to other mental disorder: Secondary | ICD-10-CM | POA: Diagnosis not present

## 2019-09-03 DIAGNOSIS — R269 Unspecified abnormalities of gait and mobility: Secondary | ICD-10-CM

## 2019-09-03 DIAGNOSIS — N1832 Chronic kidney disease, stage 3b: Secondary | ICD-10-CM

## 2019-09-03 DIAGNOSIS — G2581 Restless legs syndrome: Secondary | ICD-10-CM

## 2019-09-03 DIAGNOSIS — R2681 Unsteadiness on feet: Secondary | ICD-10-CM | POA: Diagnosis not present

## 2019-09-03 DIAGNOSIS — S8001XA Contusion of right knee, initial encounter: Secondary | ICD-10-CM | POA: Insufficient documentation

## 2019-09-03 DIAGNOSIS — F419 Anxiety disorder, unspecified: Secondary | ICD-10-CM | POA: Diagnosis not present

## 2019-09-03 DIAGNOSIS — W19XXXA Unspecified fall, initial encounter: Secondary | ICD-10-CM | POA: Insufficient documentation

## 2019-09-03 DIAGNOSIS — Z9181 History of falling: Secondary | ICD-10-CM | POA: Diagnosis not present

## 2019-09-03 NOTE — Assessment & Plan Note (Addendum)
Blood pressure is controlled, continue Norvasc, Metoprolol, ASA

## 2019-09-03 NOTE — Assessment & Plan Note (Signed)
Stable, continue Requip

## 2019-09-03 NOTE — Assessment & Plan Note (Addendum)
Chronic edema BLE, dc Furosemide 20mg  2x/wk per HPOA Dr. Sabra Heck request.

## 2019-09-03 NOTE — Assessment & Plan Note (Signed)
W/c for mobility, self transfers prior to SNF [facility]

## 2019-09-03 NOTE — Progress Notes (Addendum)
Location:   SNF [place] Room Number: 16 Place of Service:  SNF (31) Provider: Lennie Odor Ebonie Westerlund NP  Maika Mcelveen X, NP  Patient Care Team: Makarios Madlock X, NP as PCP - General (Internal Medicine) Wardell Honour, MD as Attending Physician (Family Medicine)  Extended Emergency Contact Information Primary Emergency Contact: [facility] Address: [place] [address] [place] Phone: [phone] Mobile Phone: [phone] Relation: Son Secondary Emergency Contact: [facility] Address: [address]          [place], [place] [address] [place] Phone: [phone] Relation: Son  Code Status:  DNR Goals of care: Advanced Directive information Advanced Directives 04/15/2019  Does Patient Have a Medical Advance Directive? Yes  Type of Advance Directive Out of facility DNR (pink MOST or yellow form)  Does patient want to make changes to medical advance directive? No - Patient declined  Copy of [place] in Chart? -  Would patient like information on creating a medical advance directive? -  Pre-existing out of facility DNR order (yellow form or pink MOST form) Yellow form placed in chart (order not valid for inpatient use);Pink MOST form placed in chart (order not valid for inpatient use)     Chief Complaint  Patient presents with  . Acute Visit    right knee contusion, pain, swelling, s/p fall, generazlied weakness.     HPI:  Pt is a 85 y.o. female seen today for an acute visit for s/p fall, right knee bruised/swelling/pain, 09/02/19 X-ray R femur, tibia/fibula showed no fractures. Pain is not well controlled on Tylenol, Gabapentin 15m tid.  Hx of HTN, blood pressure is controlled on Norvasc 545mqd, Metoprolol 5078mid. PVD/chronic edema BLE, R>L, Furosemide 70m2m/wk. She takes prn Xanax hs, Mirtazapine 7.5mg 7mprn for anxiety. RLS on Requip 1mg q77m   Past Medical History:  Diagnosis Date   . Anemia   . Hypertension   . OA (osteoarthritis)   . Osteoporosis   . Renal cyst    Past Surgical History:  Procedure Laterality Date  . APPENDECTOMY    . BLADDER SUSPENSION    . CATARACT EXTRACTION    . CESAREAN SECTION    . REPLACEMENT TOTAL KNEE  1995  . TONSILLECTOMY      No Known Allergies  Allergies as of 09/03/2019   No Known Allergies     Medication List       Accurate as of September 03, 2019 11:59 PM. If you have any questions, ask your nurse or doctor.        STOP taking these medications   furosemide 20 MG tablet Commonly known as: LASIX Stopped by: Odester Nilson X Pennye Beeghly, NP     TAKE these medications   ALPRAZolam 0.25 MG tablet Commonly known as: XANAX TAKE 1 TABLET AT BEDTIME AS NEEDED FOR ANXIETY OR SLEEP   amLODipine 5 MG tablet Commonly known as: NORVASC Take 1 tablet (5 mg total) by mouth daily.   Biotin 1000 MCG tablet Take 1,000 mcg by mouth daily.   cholecalciferol 25 MCG (1000 UT) tablet Commonly known as: VITAMIN D3 Take 1,000 Units by mouth daily.   gabapentin 100 MG capsule Commonly known as: NEURONTIN Take 1 capsule (100 mg total) by mouth 3 (three) times daily.   metoprolol tartrate 50 MG tablet Commonly known as: LOPRESSOR TAKE 1 TABLET BY MOUTH TWICE DAILY.   mirtazapine 7.5  MG tablet Commonly known as: REMERON Take 1 tablet (7.5 mg total) by mouth at bedtime. What changed: additional instructions   multivitamin-lutein Caps capsule Take 1 capsule by mouth daily.   rOPINIRole 1 MG tablet Commonly known as: REQUIP TAKE 1 TABLET EACH EVENING.      ROS was provided with assistance of the patient's HPOA Dr. Sabra Heck Review of Systems  Constitutional: Positive for activity change and fatigue. Negative for appetite change, chills, diaphoresis and fever.  HENT: Positive for hearing loss. Negative for congestion and voice change.   Eyes: Negative for visual disturbance.  Respiratory: Negative for cough, shortness of breath and  wheezing.   Cardiovascular: Positive for leg swelling. Negative for chest pain and palpitations.  Gastrointestinal: Negative for abdominal distention, abdominal pain, constipation, diarrhea, nausea and vomiting.  Genitourinary: Negative for difficulty urinating, dysuria and urgency.       Wears adult depends due to difficulty toileting 2nd to the right knee pain  Musculoskeletal: Positive for arthralgias, back pain, gait problem and joint swelling.       Lower back pain-managed with Gabapentin  Skin: Positive for color change.  Neurological: Negative for dizziness, speech difficulty, weakness and headaches.  Psychiatric/Behavioral: Negative for agitation, behavioral problems, hallucinations and sleep disturbance. The patient is not nervous/anxious.     Immunization History  Administered Date(s) Administered  . Influenza, High Dose Seasonal PF 07/10/2017, 07/15/2019  . Influenza,inj,Quad PF,6+ Mos 07/03/2018  . Influenza-Unspecified 07/10/2017  . Pneumococcal Conjugate-13 08/18/2014  . Pneumococcal Polysaccharide-23 07/26/2010  . Tdap 11/12/2018   Pertinent  Health Maintenance Due  Topic Date Due  . DEXA SCAN  07/22/2020 (Originally 05/16/1989)  . INFLUENZA VACCINE  Completed  . PNA vac Low Risk Adult  Completed   Fall Risk  08/12/2019 07/23/2019 07/15/2019 04/15/2019 09/02/2018  Falls in the past year? 0 0 0 0 0  Number falls in past yr: 0 0 0 0 0  Injury with Fall? - 0 - 0 0   Functional Status Survey:    Vitals:   09/03/19 1127  BP: 130/64  Pulse: 77  Resp: 18  Temp: 98.8 F (37.1 C)  SpO2: 95%   There is no height or weight on file to calculate BMI. Physical Exam Vitals signs and nursing note reviewed.  Constitutional:      General: She is not in acute distress.    Appearance: Normal appearance. She is not ill-appearing, toxic-appearing or diaphoretic.  HENT:     Head: Normocephalic and atraumatic.     Nose: Nose normal.     Mouth/Throat:     Mouth: Mucous  membranes are moist.  Eyes:     Extraocular Movements: Extraocular movements intact.     Conjunctiva/sclera: Conjunctivae normal.     Pupils: Pupils are equal, round, and reactive to light.  Neck:     Musculoskeletal: Normal range of motion and neck supple.  Cardiovascular:     Rate and Rhythm: Normal rate and regular rhythm.     Heart sounds: Murmur present.  Pulmonary:     Breath sounds: No wheezing, rhonchi or rales.  Abdominal:     General: Bowel sounds are normal. There is no distension.     Palpations: Abdomen is soft.     Tenderness: There is no abdominal tenderness. There is no right CVA tenderness, left CVA tenderness, guarding or rebound.  Musculoskeletal:        General: Swelling, tenderness and deformity present.     Right lower leg: Edema present.  Left lower leg: Edema present.     Comments: Right knee pain with movement/ROM, swelling, bruised, s/p TKR. BLE, right ankle turned laterally. Chronic edema BLE trace to 1+, R>L  Skin:    General: Skin is warm and dry.     Findings: Bruising present.     Comments: Right knee area  Neurological:     General: No focal deficit present.     Mental Status: She is alert and oriented to person, place, and time. Mental status is at baseline.     Gait: Gait abnormal.  Psychiatric:        Mood and Affect: Mood normal.        Behavior: Behavior normal.        Thought Content: Thought content normal.        Judgment: Judgment normal.     Labs reviewed: Recent Labs    01/01/19 0800 03/05/19 0810 07/23/19 0000  NA 139 140 143  K 3.6 3.4* 3.9  CL 99 99 102  CO2 31 31 28   GLUCOSE 89 84 78  BUN 27* 30* 29*  CREATININE 1.19* 1.14* 1.12*  CALCIUM 9.2 9.4 9.4   Recent Labs    01/01/19 0800 03/05/19 0810 07/23/19 0000  AST 16 17 17   ALT 9 8 9   BILITOT 0.5 0.6 0.7  PROT 6.5 6.6 6.8   Recent Labs    01/01/19 0800 03/05/19 0810 07/23/19 0000  WBC 6.1 6.3 5.6  NEUTROABS 3,093 3,213 2,699  HGB 12.8 13.0 12.8  HCT  37.8 38.9 38.4  MCV 100.5* 100.3* 101.9*  PLT 211 195 176   Lab Results  Component Value Date   TSH 1.54 07/23/2019   No results found for: HGBA1C Lab Results  Component Value Date   CHOL 175 07/23/2019   HDL 54 07/23/2019   LDLCALC 105 (H) 07/23/2019   TRIG 74 07/23/2019   CHOLHDL 3.2 07/23/2019    Significant Diagnostic Results in last 30 days:  No results found.  Assessment/Plan Contusion of right knee Sustained from a mechanical fall at home, swelling, bruised, pain, s/p TKR, X-ray 09/02/19 showed no fractures, continue Gabapentin 132m tid, prn Tylenol-no efficacy, will try Norco 5/3257mq6hr prn for severe pain control, PT/OT to eval/tx as indicated, Colace 10086mid for constipation prevention.   Generalized weakness Generalized weakness, especially lower extremities in setting of pain in the right knee/leg, will update CBC/diff, CMP/eGFR, X-ray lumbar spine.   Fall Mechanical fall, extra dose 300m32mbapentin x1 may be contributory, SNF [facility] for safety, care assistance. observe  Gait abnormality W/c for mobility, self transfers [facility] Blood pressure is controlled, continue Norvasc, Metoprolol, ASA  PVD (peripheral vascular disease) (HCC) Chronic edema BLE, dc Furosemide 20mg7mwk per HPOA Dr. MilleSabra Heckest.   RLS (restless legs syndrome) Stable, continue Requip  Insomnia secondary to anxiety Continue prn Mirtazapine, Xanax.   CKD (chronic kidney disease) stage 3, GFR 30-59 ml/min (HCC) chronic    Family/ staff Communication: plan of care reviewed with the patient, the patient's HPOA Dr. MilleSabra Heck charge nurse .  Labs/tests ordered:  CBC/diff, CMP/eGFR, UA C/S, X-ray lumbar spine.   Time spend 25 minutes.

## 2019-09-03 NOTE — Addendum Note (Signed)
Addended by: Chivon Lepage X on: 09/03/2019 01:17 PM   Modules accepted: Orders

## 2019-09-03 NOTE — Assessment & Plan Note (Addendum)
Sustained from a mechanical fall at home, swelling, bruised, pain, s/p TKR, X-ray 09/02/19 showed no fractures, continue Gabapentin 100mg  tid, prn Tylenol-no efficacy, will try Norco 5/325mg  q6hr prn for severe pain control, PT/OT to eval/tx as indicated, Colace 100mg  bid for constipation prevention.

## 2019-09-03 NOTE — Assessment & Plan Note (Signed)
Mechanical fall, extra dose 300mg  Gabapentin x1 may be contributory, SNF [facility] for safety, care assistance. observe

## 2019-09-03 NOTE — Assessment & Plan Note (Signed)
Continue prn Mirtazapine, Xanax.

## 2019-09-03 NOTE — Assessment & Plan Note (Addendum)
Generalized weakness, especially lower extremities in setting of pain in the right knee/leg, will update CBC/diff, CMP/eGFR, X-ray lumbar spine.

## 2019-09-03 NOTE — Assessment & Plan Note (Signed)
chronic

## 2019-09-04 ENCOUNTER — Other Ambulatory Visit: Payer: Self-pay | Admitting: *Deleted

## 2019-09-04 DIAGNOSIS — R7989 Other specified abnormal findings of blood chemistry: Secondary | ICD-10-CM | POA: Diagnosis not present

## 2019-09-04 DIAGNOSIS — R509 Fever, unspecified: Secondary | ICD-10-CM | POA: Diagnosis not present

## 2019-09-04 DIAGNOSIS — R309 Painful micturition, unspecified: Secondary | ICD-10-CM | POA: Diagnosis not present

## 2019-09-04 DIAGNOSIS — R6889 Other general symptoms and signs: Secondary | ICD-10-CM | POA: Diagnosis not present

## 2019-09-04 DIAGNOSIS — M545 Low back pain: Secondary | ICD-10-CM | POA: Diagnosis not present

## 2019-09-04 MED ORDER — HYDROCODONE-ACETAMINOPHEN 5-325 MG PO TABS: 1.0000 | ORAL_TABLET | Freq: Four times a day (QID) | ORAL | 0 refills | Status: DC | PRN

## 2019-09-04 NOTE — Telephone Encounter (Signed)
Received fax from [facility] and sent to [facility] for approval.

## 2019-09-07 ENCOUNTER — Encounter: Payer: Self-pay | Admitting: Internal Medicine

## 2019-09-07 ENCOUNTER — Non-Acute Institutional Stay (SKILLED_NURSING_FACILITY): Payer: PPO | Admitting: Internal Medicine

## 2019-09-07 DIAGNOSIS — M5442 Lumbago with sciatica, left side: Secondary | ICD-10-CM

## 2019-09-07 DIAGNOSIS — R05 Cough: Secondary | ICD-10-CM | POA: Diagnosis not present

## 2019-09-07 DIAGNOSIS — M5441 Lumbago with sciatica, right side: Secondary | ICD-10-CM | POA: Diagnosis not present

## 2019-09-07 DIAGNOSIS — J209 Acute bronchitis, unspecified: Secondary | ICD-10-CM

## 2019-09-07 DIAGNOSIS — M76821 Posterior tibial tendinitis, right leg: Secondary | ICD-10-CM

## 2019-09-07 DIAGNOSIS — D649 Anemia, unspecified: Secondary | ICD-10-CM

## 2019-09-07 DIAGNOSIS — I10 Essential (primary) hypertension: Secondary | ICD-10-CM | POA: Diagnosis not present

## 2019-09-07 DIAGNOSIS — S32050G Wedge compression fracture of fifth lumbar vertebra, subsequent encounter for fracture with delayed healing: Secondary | ICD-10-CM | POA: Diagnosis not present

## 2019-09-07 NOTE — Progress Notes (Addendum)
Provider:  Veleta Miners MD Location:    Nursing Home Room Number: 16 Place of Service:  SNF (31)  PCP: Mast, Man X, NP Patient Care Team: Mast, Man X, NP as PCP - General (Internal Medicine) Wardell Honour, MD as Attending Physician (Family Medicine)  Extended Emergency Contact Information Primary Emergency Contact: [facility] Address: [address] [place] [address] [place] Phone: [phone] Mobile Phone: [phone] Relation: Son Secondary Emergency Contact: [facility] Address: [address]          [place], [place] [address] [place] Phone: [phone] Relation: Son  Code Status: DNR Goals of Care: Advanced Directive information Advanced Directives 09/07/2019  Does Patient Have a Medical Advance Directive? Yes  Type of Advance Directive Out of facility DNR (pink MOST or yellow form)  Does patient want to make changes to medical advance directive? No - Patient declined  Copy of [place] in Chart? -  Would patient like information on creating a medical advance directive? -  Pre-existing out of facility DNR order (yellow form or pink MOST form) Pink MOST form placed in chart (order not valid for inpatient use)      Chief Complaint  Patient presents with  . New Admit To SNF    Admission to SNF    HPI: Patient is a 85 y.o. female seen today for admission to SNF for Care and Monitoring  Patient has h/o Hypertension, Anxiety, Restless leg Syndrome, Insomnia, Arthritis and Posterior Tendon Dysfunction with right foot Valgus in Right Knee with inability toAmbulate And a history of right lower extremity hematoma needing wound care. It is healed now  Patient lives by herself in [place] apartment in [facility]. She is usually independent in her Transfers and ADLS.  Though she does not ambulate and is wheelchair dependent She was unable to give me detail history   She  has been having  Acute Back pain last week  which is new for her. Dr. Sabra Heck had called me and we had discussed about her pain.  I had started her on Neurontin 100 mg p.o. 3 times daily. Patient continues to have pain and then she fell few days ago.  Per patient she was trying to get up from her commode when she lost balance and fell.  Since then she has been complaining of back pain radiating down her legs.  Also her right knee is swollen and red. Dr. Sabra Heck was able to get her transferred to SNF for further care.  X-ray of her knee has been negative for any acute fractures.  Her low back.  X-ray showed possible L5 compression fracture.  She has been on Norco PRN  For Pain Control since she has been in the facility  Patient was also complaining of Productive cough, And SOB .  No fever or chills.  She however there pulse ox was 75% and she is now on oxygen at 90% She did seem little confused and was talking about going to the hospital before.   Past Medical History:  Diagnosis Date  . Anemia   . Hypertension   . OA (osteoarthritis)   . Osteoporosis   . Renal cyst    Past Surgical History:  Procedure Laterality Date  . APPENDECTOMY    . BLADDER SUSPENSION    . CATARACT EXTRACTION    . CESAREAN SECTION    . REPLACEMENT TOTAL KNEE  1995  .  TONSILLECTOMY      reports that she has quit smoking. Her smoking use included e-cigarettes. She has never used smokeless tobacco. She reports that she does not drink alcohol or use drugs. Social History   Socioeconomic History  . Marital status: Widowed    Spouse name: Not on file  . Number of children: 2  . Years of education: 3  . Highest education level: Not on file  Occupational History  . Occupation: Retired Ship broker  . Financial resource strain: Not hard at all  . Food insecurity    Worry: Never true    Inability: Never true  . Transportation needs    Medical: No    Non-medical: No  Tobacco Use  . Smoking status: Former Smoker    Types:  E-cigarettes  . Smokeless tobacco: Never Used  . Tobacco comment: when she was 85 years old  Substance and Sexual Activity  . Alcohol use: No  . Drug use: No  . Sexual activity: Never    Birth control/protection: None  Lifestyle  . Physical activity    Days per week: 0 days    Minutes per session: 0 min  . Stress: Not at all  Relationships  . Social connections    Talks on phone: More than three times a week    Gets together: More than three times a week    Attends religious service: Never    Active member of club or organization: No    Attends meetings of clubs or organizations: Never    Relationship status: Widowed  . Intimate partner violence    Fear of current or ex partner: No    Emotionally abused: No    Physically abused: No    Forced sexual activity: No  Other Topics Concern  . Not on file  Social History Narrative   Coffee-with Caffeine   Widow   2 sons   No pets   Retired Therapist, sports   No exercise   HCPOA, Living Will, No DNR   + glasses   + hearing aids    Functional Status Survey:    Family History  Problem Relation Age of Onset  . Colon cancer Mother   . Arthritis Mother   . Heart disease Father   . Heart attack Father   . Migraines Sister   . Allergies Sister   . Asthma Sister     Health Maintenance  Topic Date Due  . DEXA SCAN  07/22/2020 (Originally 05/16/1989)  . TETANUS/TDAP  11/12/2028  . INFLUENZA VACCINE  Completed  . PNA vac Low Risk Adult  Completed    No Known Allergies  Allergies as of 09/07/2019   No Known Allergies     Medication List       Accurate as of September 07, 2019  9:48 AM. If you have any questions, ask your nurse or doctor.        acetaminophen 500 MG tablet Commonly known as: TYLENOL Take 1,000 mg by mouth every 8 (eight) hours as needed.   ALPRAZolam 0.25 MG tablet Commonly known as: XANAX TAKE 1 TABLET AT BEDTIME AS NEEDED FOR ANXIETY OR SLEEP   amLODipine 5 MG tablet Commonly known as: NORVASC Take 1  tablet (5 mg total) by mouth daily.   aspirin EC 81 MG tablet Take 81 mg by mouth. Once A Morning on Mon, Thu   Biotin 5000 MCG Tabs Take 5,000 mcg by mouth daily.   docusate sodium 100 MG capsule Commonly  known as: COLACE Take 100 mg by mouth 2 (two) times daily.   gabapentin 100 MG capsule Commonly known as: NEURONTIN Take 1 capsule (100 mg total) by mouth 3 (three) times daily.   HYDROcodone-acetaminophen 5-325 MG tablet Commonly known as: NORCO/VICODIN Take 1 tablet by mouth every 6 (six) hours as needed for severe pain.   metoprolol tartrate 50 MG tablet Commonly known as: LOPRESSOR TAKE 1 TABLET BY MOUTH TWICE DAILY.   mirtazapine 15 MG tablet Commonly known as: REMERON Take 7.5 mg by mouth at bedtime. What changed: Another medication with the same name was removed. Continue taking this medication, and follow the directions you see here. Changed by: Virgie Dad, MD   multivitamin-lutein Caps capsule Take 1 capsule by mouth daily.   rOPINIRole 1 MG tablet Commonly known as: REQUIP TAKE 1 TABLET EACH EVENING.   Tubersol 5 UNIT/0.1ML injection Generic drug: tuberculin Inject into the skin once. Once A Day Every 14 Days   Vitamin D 50 MCG (2000 UT) Caps Take 2,000 Units by mouth daily.   zinc oxide 20 % ointment Apply 1 application topically as needed for irritation.       Review of Systems  Constitutional: Positive for activity change and appetite change.  HENT: Negative.   Respiratory: Positive for cough, shortness of breath and wheezing.   Cardiovascular: Positive for leg swelling.  Gastrointestinal: Positive for constipation.  Genitourinary: Negative.   Musculoskeletal: Positive for back pain.  Neurological: Positive for weakness.  Psychiatric/Behavioral: Positive for confusion.    Vitals:   09/07/19 0938  BP: 120/76  Pulse: 73  Resp: 20  Temp: (!) 97.1 F (36.2 C)  SpO2: 91%  Weight: 136 lb (61.7 kg)  Height: 5\' 1"  (1.549 m)   Body  mass index is 25.7 kg/m. Physical Exam Vitals signs reviewed.  Constitutional:      Appearance: Normal appearance.  HENT:     Head: Normocephalic.     Nose: Nose normal.     Mouth/Throat:     Mouth: Mucous membranes are dry.     Pharynx: Oropharynx is clear.  Eyes:     Pupils: Pupils are equal, round, and reactive to light.  Neck:     Musculoskeletal: Neck supple.  Cardiovascular:     Rate and Rhythm: Normal rate and regular rhythm.     Pulses: Normal pulses.     Heart sounds: Murmur present.  Pulmonary:     Effort: Pulmonary effort is normal.     Comments: Bilateral Expiratory Wheezing Abdominal:     General: Abdomen is flat. Bowel sounds are normal.     Palpations: Abdomen is soft.  Musculoskeletal:     Comments: Mild swelling Bilateral Right more then Left. Also has bruise and swelling in her right Knee with Mild tenderness  Neurological:     Mental Status: She is alert.     Comments: Patient has mild weakness in her Right LE. Left was 4/5 strength. She was little confused about the event and her transfer to this unit.  Psychiatric:        Mood and Affect: Mood normal.     Labs reviewed: Basic Metabolic Panel: Recent Labs    01/01/19 0800 03/05/19 0810 07/23/19 0000  NA 139 140 143  K 3.6 3.4* 3.9  CL 99 99 102  CO2 31 31 28   GLUCOSE 89 84 78  BUN 27* 30* 29*  CREATININE 1.19* 1.14* 1.12*  CALCIUM 9.2 9.4 9.4   Liver Function Tests: Recent  Labs    01/01/19 0800 03/05/19 0810 07/23/19 0000  AST 16 17 17   ALT 9 8 9   BILITOT 0.5 0.6 0.7  PROT 6.5 6.6 6.8   No results for input(s): LIPASE, AMYLASE in the last 8760 hours. No results for input(s): AMMONIA in the last 8760 hours. CBC: Recent Labs    01/01/19 0800 03/05/19 0810 07/23/19 0000  WBC 6.1 6.3 5.6  NEUTROABS 3,093 3,213 2,699  HGB 12.8 13.0 12.8  HCT 37.8 38.9 38.4  MCV 100.5* 100.3* 101.9*  PLT 211 195 176   Cardiac Enzymes: No results for input(s): CKTOTAL, CKMB, CKMBINDEX,  TROPONINI in the last 8760 hours. BNP: Invalid input(s): POCBNP No results found for: HGBA1C Lab Results  Component Value Date   TSH 1.54 07/23/2019   No results found for: VITAMINB12 No results found for: FOLATE Lab Results  Component Value Date   FERRITIN 86 07/29/2017    Imaging and Procedures obtained prior to SNF admission: Dg Ankle Complete Right  Result Date: 11/12/2018 CLINICAL DATA:  Laceration. EXAM: RIGHT ANKLE - COMPLETE 3+ VIEW COMPARISON:  None. FINDINGS: There is a soft tissue laceration involving the medial soft tissues above the level of the ankle. No underlying tibial fracture. Remote healed distal fibular fracture noted along with probable old avulsion fractures involving the medial and lateral malleoli. Moderate ankle and subtalar joint degenerative changes and midfoot degenerative changes. IMPRESSION: Remote posttraumatic changes and degenerative changes but no acute bony findings or radiopaque foreign body. Large laceration involving the medial soft tissues of the right lower leg. Electronically Signed   By: Marijo Sanes M.D.   On: 11/12/2018 08:08    Assessment/Plan  Acute bronchitis RA O2 was 75-80%  Will start her on Prednisone 40 mg QD Taper over 1 week Stat Chest Xray Continue on Oxygen Continue in Quarantine. She was negative for Covid few days ago Albuteral Inhalers Q6 For 3 days and then PRN Addendum Chest Xray showed Right Middle Lobe pneumonia ? CHF  Will start on Doxycycline for 7 days Acute right-sided low back pain with bilateral sciatica Continue Neurontin and Norco PRN Right Knee swelling  Xrays were negative for any acute changes Will continue Norco PRN Aspirin was discontinued  Closed compression fracture of L5 lumbar vertebra  S/P Fall Continue Norco and Neurontin Would not increase the dose right now Essential hypertension On Norvasc and Lopressor Posterior tibial tendon dysfunction, right Continue supportive care Anemia,  unspecified type Repeat CBC in 1 week Will check B12 level Will also consider Iron as she has documented Iron def and has this bleed in her Legs Unsteady Gait Will need Therapy eval once more stable and out of Quarantine Depression and Anxiety Will continue Remeron and Xanax PRN Restless Leg On Requip Constipation Started on Senna QHS ACP D/W Dr Sabra Heck. Palliative Care consult placed.   Family/ staff Communication:   Labs/tests ordered: Total time spent in this patient care encounter was  45_  minutes; greater than 50% of the visit spent counseling patient and staff, reviewing records , Labs and coordinating care for problems addressed at this encounter.

## 2019-09-08 ENCOUNTER — Non-Acute Institutional Stay (SKILLED_NURSING_FACILITY): Payer: PPO | Admitting: Nurse Practitioner

## 2019-09-08 ENCOUNTER — Encounter: Payer: Self-pay | Admitting: Nurse Practitioner

## 2019-09-08 DIAGNOSIS — F419 Anxiety disorder, unspecified: Secondary | ICD-10-CM

## 2019-09-08 DIAGNOSIS — J189 Pneumonia, unspecified organism: Secondary | ICD-10-CM | POA: Insufficient documentation

## 2019-09-08 DIAGNOSIS — I10 Essential (primary) hypertension: Secondary | ICD-10-CM

## 2019-09-08 DIAGNOSIS — G2581 Restless legs syndrome: Secondary | ICD-10-CM

## 2019-09-08 DIAGNOSIS — K5901 Slow transit constipation: Secondary | ICD-10-CM

## 2019-09-08 DIAGNOSIS — S8001XD Contusion of right knee, subsequent encounter: Secondary | ICD-10-CM

## 2019-09-08 DIAGNOSIS — D649 Anemia, unspecified: Secondary | ICD-10-CM

## 2019-09-08 DIAGNOSIS — I739 Peripheral vascular disease, unspecified: Secondary | ICD-10-CM

## 2019-09-08 DIAGNOSIS — S32050S Wedge compression fracture of fifth lumbar vertebra, sequela: Secondary | ICD-10-CM

## 2019-09-08 DIAGNOSIS — F5105 Insomnia due to other mental disorder: Secondary | ICD-10-CM | POA: Diagnosis not present

## 2019-09-08 DIAGNOSIS — Z20828 Contact with and (suspected) exposure to other viral communicable diseases: Secondary | ICD-10-CM | POA: Diagnosis not present

## 2019-09-08 HISTORY — DX: Wedge compression fracture of fifth lumbar vertebra, sequela: S32.050S

## 2019-09-08 NOTE — Assessment & Plan Note (Signed)
Lower back pain/sciatica, X-ray showed L5 lumbar vertebra compression, HPOA son, Dr. Sabra Heck is going to check with Ortho, on Gabapentin 100mg  tid,  prn Norco q6h.

## 2019-09-08 NOTE — Assessment & Plan Note (Signed)
Stable, continue Requip

## 2019-09-08 NOTE — Assessment & Plan Note (Signed)
Ecchymoses presents, there is catching motion with knee flexion, X-ray after the contusion showed no acute fx, long standing not bearing full weight on the right leg. Will have therapy to eval for knee immobilizer for symptomatic management.

## 2019-09-08 NOTE — Assessment & Plan Note (Signed)
pending CBC, Vit B12.

## 2019-09-08 NOTE — Progress Notes (Signed)
Ann Jensen, Ann Jensen ([id number]) Visit Report for 06/05/2019 Abuse/Suicide Risk Screen Details Patient Name: Date of Service: Ann Jensen, Ann Jensen 06/05/2019 2:45 PM Medical Record [id number] Patient Account Number: [id number] Date of Birth/Sex: 04-25-1922 (85 y.o. Female) Treating RN: Carlene Coria Primary Care Karinda Cabriales: Other Clinician: Referring Maximus Hoffert: Treating Marshall Roehrich/Extender:Robson, Esperanza Richters in Treatment: 0 Abuse/Suicide Risk Screen Items Answer ABUSE RISK SCREEN: Has anyone close to you tried to hurt or harm you recentlyo No Do you feel uncomfortable with anyone in your familyo No Has anyone forced you do things that you didnt want to doo No Electronic Signature(s) Signed: 09/08/2019 3:04:22 PM By: Carlene Coria RN Entered By: Carlene Coria on 06/05/2019 15:27:54 -------------------------------------------------------------------------------- Activities of Daily Living Details Patient Name: Date of Service: Ann Jensen, Ann Jensen 06/05/2019 2:45 PM Medical Record [id number] Patient Account Number: [id number] Date of Birth/Sex: [birth year] (85 y.o. Female) Treating RN: Carlene Coria Primary Care Torryn Hudspeth: Other Clinician: Referring Dusti Tetro: Treating Ardelle Haliburton/Extender:Robson, Esperanza Richters in Treatment: 0 Activities of Daily Living Items Answer Activities of Daily Living (Please select one for each item) Drive Automobile Not Able Take Medications Need Assistance Use Telephone Completely Able Care for Appearance Need Assistance Use Toilet Need Assistance Bath / Shower Need Assistance Dress Self Need Assistance Feed Self Not Able Walk Not Able Get In / Out Bed Need Assistance Housework Not Able Prepare Meals Not Able Handle Money Need Assistance Shop for Self Not Able Electronic Signature(s) Signed: 09/08/2019 3:04:22 PM By: Carlene Coria RN Entered By: Carlene Coria on 06/05/2019  15:28:30 -------------------------------------------------------------------------------- Education Screening Details Patient Name: Date of Service: Ann Jensen 06/05/2019 2:45 PM Medical Record [id number] Patient Account Number: [id number] Date of Birth/Sex: 15-Mar-1922 (85 y.o. Female) Treating RN: Carlene Coria Primary Care Cortlandt Capuano: Other Clinician: Referring Loany Neuroth: Treating Doralee Kocak/Extender:Robson, Esperanza Richters in Treatment: 0 Primary Learner Assessed: Patient Learning Preferences/Education Level/Primary Language Learning Preference: Explanation Highest Education Level: College or Above Preferred Language: English Cognitive Barrier Language Barrier: No Translator Needed: No Memory Deficit: No Emotional Barrier: No Cultural/Religious Beliefs Affecting Medical Care: No Physical Barrier Impaired Vision: No Impaired Hearing: No Decreased Hand dexterity: No Knowledge/Comprehension Knowledge Level: High Comprehension Level: High Ability to understand written High instructions: Ability to understand verbal High instructions: Motivation Anxiety Level: Calm Cooperation: Cooperative Education Importance: Acknowledges Need Interest in Health Problems: Asks Questions Perception: Coherent Willingness to Engage in Self- High Management Activities: Readiness to Engage in Self- High Management Activities: Electronic Signature(s) Signed: 09/08/2019 3:04:22 PM By: Carlene Coria RN Entered By: Carlene Coria on 06/05/2019 15:28:59 -------------------------------------------------------------------------------- Fall Risk Assessment Details Patient Name: Date of Service: Ann Jensen 06/05/2019 2:45 PM Medical Record [id number] Patient Account Number: [id number] Date of Birth/Sex: 1921/11/12 (85 y.o. Female) Treating RN: Carlene Coria Primary Care Jurgen Groeneveld: Other Clinician: Referring Theta Leaf: Treating Terrance Usery/Extender:Robson, Esperanza Richters in Treatment:  0 Fall Risk Assessment Items Have you had 2 or more falls in the last 12 monthso 0 No Have you had any fall that resulted in injury in the last 12 monthso 0 No FALLS RISK SCREEN History of falling - immediate or within 3 months 0 No Secondary diagnosis (Do you have 2 or more medical diagnoseso) 0 No Ambulatory aid None/bed rest/wheelchair/nurse 0 No Crutches/cane/walker 0 No Furniture 0 No Intravenous therapy Access/Saline/Heparin Lock 0 No Weak (short steps with or without shuffle, stooped but able to lift head 0 No while walking, may seek support from furniture) Impaired (short steps with shuffle, may have difficulty arising from chair, 0 No head down, impaired balance) Mental Status Oriented to own ability  0 No Overestimates or forgets limitations 0 No Risk Level: Low Risk Score: 0 Electronic Signature(s) Signed: 09/08/2019 3:04:22 PM By: Carlene Coria RN Entered By: Carlene Coria on 06/05/2019 15:29:05 -------------------------------------------------------------------------------- Foot Assessment Details Patient Name: Date of Service: Ann Jensen. 06/05/2019 2:45 PM Medical Record [id number] Patient Account Number: [id number] Date of Birth/Sex: 10-19-1921 (85 y.o. Female) Treating RN: Carlene Coria Primary Care Sonnia Strong: Other Clinician: Referring Jarvis Knodel: Treating Jeweldean Drohan/Extender:Robson, Esperanza Richters in Treatment: 0 Foot Assessment Items Site Locations + = Sensation present, - = Sensation absent, C = Callus, U = Ulcer R = Redness, W = Warmth, M = Maceration, PU = Pre-ulcerative lesion F = Fissure, S = Swelling, D = Dryness Assessment Right: Left: Other Deformity: No No Prior Foot Ulcer: No No Prior Amputation: No No Charcot Joint: No No Ambulatory Status: Non-ambulatory Assistance Device: Wheelchair Gait: Administrator, arts) Signed: 09/08/2019 3:04:22 PM By: Carlene Coria RN Entered By: Carlene Coria on 06/05/2019  15:34:16 -------------------------------------------------------------------------------- Nutrition Risk Screening Details Patient Name: Date of Service: Ann Jensen, Ann Jensen 06/05/2019 2:45 PM Medical Record [id number] Patient Account Number: [id number] Date of Birth/Sex: 09/05/1922 (85 y.o. Female) Treating RN: Carlene Coria Primary Care Sarahgrace Broman: Other Clinician: Referring Antwyne Pingree: Treating Birdie Beveridge/Extender:Robson, Esperanza Richters in Treatment: 0 Height (in): 59 Weight (lbs): 141 Body Mass Index (BMI): 28.5 Nutrition Risk Screening Items Score Screening NUTRITION RISK SCREEN: I have an illness or condition that made me change the kind and/or 0 No amount of food I eat I eat fewer than two meals per day 0 No I eat few fruits and vegetables, or milk products 0 No I have three or more drinks of beer, liquor or wine almost every day 0 No I have tooth or mouth problems that make it hard for me to eat 0 No I don't always have enough money to buy the food I need 0 No I eat alone most of the time 0 No I take three or more different prescribed or over-the-counter drugs a day 1 Yes 0 No Without wanting to, I have lost or gained 10 pounds in the last six months I am not always physically able to shop, cook and/or feed myself 2 Yes Nutrition Protocols Good Risk Protocol Provide education on Moderate Risk Protocol 0 nutrition High Risk Proctocol Risk Level: Moderate Risk Score: 3 Electronic Signature(s) Signed: 09/08/2019 3:04:22 PM By: Carlene Coria RN Entered By: Carlene Coria on 06/05/2019 15:29:19

## 2019-09-08 NOTE — Assessment & Plan Note (Signed)
Stable, continue Senokot S II qd  

## 2019-09-08 NOTE — Progress Notes (Signed)
Ann Jensen, Ann Jensen ([id number]) Visit Report for 06/05/2019 Allergy List Details Patient Name: Date of Service: Ann Jensen, Ann Jensen 06/05/2019 2:45 PM Medical Record [id number] Patient Account Number: [id number] Date of Birth/Sex: Treating RN: September 19, 1922 (85 y.o. Female) Carlene Coria Primary Care Lyndzie Zentz: Other Clinician: Referring Venus Ruhe: Treating Binnie Droessler/Extender:Robson, Esperanza Richters in Treatment: 0 Allergies Active Allergies No Known Allergies Allergy Notes Electronic Signature(s) Signed: 09/08/2019 3:04:22 PM By: Carlene Coria RN Entered By: Carlene Coria on 06/05/2019 15:27:00 -------------------------------------------------------------------------------- Arrival Information Details Patient Name: Date of Service: Ann Jensen 06/05/2019 2:45 PM Medical Record [id number] Patient Account Number: [id number] Date of Birth/Sex: Treating RN: Aug 07, 1922 (85 y.o. Female) Epps, [place]aren Primary Care Mackey Varricchio: Other Clinician: Referring Hamlet Lasecki: Treating Darshana Curnutt/Extender:Robson, Esperanza Richters in Treatment: 0 Visit Information Patient Arrived: Wheel Chair Arrival Time: 15:25 Accompanied By: son Transfer Assistance: None Patient Identification Verified: Yes Secondary Verification Process Completed: Yes Patient Requires Transmission-Based No Precautions: Patient Has Alerts: Yes Patient Alerts: L ABI = 0.87 Electronic Signature(s) Signed: 06/10/2019 6:55:12 PM By: Levan Hurst RN, BSN Entered By: Tedra Coupe History Since Last Visit All ordered tests and consults were completed: No Added or deleted any medications: No Any new allergies or adverse reactions: No Had a fall or experienced change in activities of daily living that may affect risk of falls: No Signs or symptoms of abuse/neglect since last visito No Hospitalized since last visit: No Implantable device outside of the clinic excluding cellular tissue based products placed in the center since last visit:  No Pain Present Now: No -------------------------------------------------------------------------------- Clinic Level of Care Assessment Details Patient Name: Date of Service: Ann Jensen, Ann Jensen 06/05/2019 2:45 PM Medical Record [id number] Patient Account Number: [id number] Date of Birth/Sex: Treating RN: 10-30-1921 (85 y.o. Female) Levan Hurst Primary Care Joylyn Duggin: Other Clinician: Referring Graclynn Vanantwerp: Treating Jiles Goya/Extender:Robson, Esperanza Richters in Treatment: 0 Clinic Level of Care Assessment Items TOOL 1 Quantity Score X - Use when EandM and Procedure is performed on INITIAL visit 1 0 ASSESSMENTS - Nursing Assessment / Reassessment X - General Physical Exam (combine w/ comprehensive assessment (listed just below) 1 20 when performed on new pt. evals) X - Comprehensive Assessment (HX, ROS, Risk Assessments, Wounds Hx, etc.) 1 25 ASSESSMENTS - Wound and Skin Assessment / Reassessment []  - Dermatologic / Skin Assessment (not related to wound area) 0 ASSESSMENTS - Ostomy and/or Continence Assessment and Care []  - Incontinence Assessment and Management 0 []  - Ostomy Care Assessment and Management (repouching, etc.) 0 PROCESS - Coordination of Care X - Simple Patient / Family Education for ongoing care 1 15 []  - Complex (extensive) Patient / Family Education for ongoing care 0 X - Staff obtains Programmer, systems, Records, Test Results / Process Orders 1 10 []  - Staff telephones HHA, Nursing Homes / Clarify orders / etc 0 []  - Routine Transfer to another Facility (non-emergent condition) 0 []  - Routine Hospital Admission (non-emergent condition) 0 X - New Admissions / Biomedical engineer / Ordering NPWT, Apligraf, etc. 1 15 []  - Emergency Hospital Admission (emergent condition) 0 PROCESS - Special Needs []  - Pediatric / Minor Patient Management 0 []  - Isolation Patient Management 0 []  - Hearing / Language / Visual special needs 0 []  - Assessment of Community assistance  (transportation, D/C planning, etc.) 0 []  - Additional assistance / Altered mentation 0 []  - Support Surface(s) Assessment (bed, cushion, seat, etc.) 0 INTERVENTIONS - Miscellaneous []  - External ear exam 0 []  - Patient Transfer (multiple staff / Civil Service fast streamer / Similar devices) 0 []  - Simple Staple / Suture  removal (25 or less) 0 []  - Complex Staple / Suture removal (26 or more) 0 []  - Hypo/Hyperglycemic Management (do not check if billed separately) 0 []  - Ankle / Brachial Index (ABI) - do not check if billed separately 0 Has the patient been seen at the hospital within the last three years: Yes Total Score: 85 Level Of Care: New/Established - Level 3 Electronic Signature(s) Signed: 06/10/2019 6:55:12 PM By: Levan Hurst RN, BSN Entered By: Levan Hurst on 06/05/2019 18:24:06 -------------------------------------------------------------------------------- Compression Therapy Details Patient Name: Date of Service: Ann Jensen 06/05/2019 2:45 PM Medical Record [id number] Patient Account Number: [id number] Date of Birth/Sex: 1922/09/22 (85 y.o. Female) Treating RN: Levan Hurst Primary Care Jhonnie Aliano: Other Clinician: Referring Kasara Schomer: Treating Kemara Quigley/Extender:Robson, Esperanza Richters in Treatment: 0 Compression Therapy Performed for Wound Wound #3 Right,Medial Lower Leg Assessment: Performed By: Clinician Levan Hurst, RN Compression Type: Three Layer Post Procedure Diagnosis Same as Pre-procedure Electronic Signature(s) Signed: 06/10/2019 6:55:12 PM By: Levan Hurst RN, BSN Entered By: Levan Hurst on 06/05/2019 16:14:18 -------------------------------------------------------------------------------- Encounter Discharge Information Details Patient Name: Date of Service: Ann Jensen 06/05/2019 2:45 PM Medical Record [id number] Patient Account Number: [id number] Date of Birth/Sex: Treating RN: 03-Jul-1922 (85 y.o. Female) Deon Pilling Primary Care  Bianca Raneri: Other Clinician: Referring Alphonza Tramell: Treating Maram Bently/Extender:Robson, Esperanza Richters in Treatment: 0 Encounter Discharge Information Items Post Procedure Vitals Discharge Condition: Stable Temperature (F): 98.3 Ambulatory Status: Ambulatory Pulse (bpm): 69 Discharge Destination: Home Respiratory Rate (breaths/min): 18 Transportation: Private Auto Blood Pressure (mmHg): 142/84 Accompanied By: self Schedule Follow-up Appointment: Yes Clinical Summary of Care: Electronic Signature(s) Signed: 06/05/2019 6:15:35 PM By: Deon Pilling Entered By: Deon Pilling on 06/05/2019 17:31:16 -------------------------------------------------------------------------------- Lower Extremity Assessment Details Patient Name: Date of Service: Ann Jensen, Ann Jensen 06/05/2019 2:45 PM Medical Record [id number] Patient Account Number: [id number] Date of Birth/Sex: Treating RN: 04-30-1922 (85 y.o. Female) Carlene Coria Primary Care Tinita Brooker: Other Clinician: Referring Dixie Coppa: Treating Analyssa Downs/Extender:Robson, Esperanza Richters in Treatment: 0 Edema Assessment Assessed: [Left: No] [Right: No] E[Left: dema] [Right: :] Calf Left: Right: Point of Measurement: 33 cm From Medial Instep cm 40 cm Ankle Left: Right: Point of Measurement: 10 cm From Medial Instep cm 23 cm Vascular Assessment Pulses: Dorsalis Pedis Palpable: [Right:Yes] Notes previous ABI .87 Electronic Signature(s) Signed: 09/08/2019 3:04:22 PM By: Carlene Coria RN Entered By: Carlene Coria on 06/05/2019 15:37:41 -------------------------------------------------------------------------------- Multi Wound Chart Details Patient Name: Date of Service: Ann Jensen 06/05/2019 2:45 PM Medical Record [id number] Patient Account Number: [id number] Date of Birth/Sex: Treating RN: 25-Sep-1922 (85 y.o. Female) Levan Hurst Primary Care Waneda Klammer: Other Clinician: Referring Chaney Ingram: Treating Margeaux Swantek/Extender:Robson, Esperanza Richters  in Treatment: 0 Vital Signs Height(in): 59 Pulse(bpm): 56 Weight(lbs): 141 Blood Pressure(mmHg): 142/84 Body Mass Index(BMI): 28 Temperature(F): 98.3 Respiratory 18 Rate(breaths/min): Photos: [3:No Photos] [N/A:N/A] Wound Location: [3:Right Lower Leg - Medial N/A] Wounding Event: [3:Gradually Appeared] [N/A:N/A] Primary Etiology: [3:Venous Leg Ulcer] [N/A:N/A] Comorbid History: [3:Hypertension, Rheumatoid N/A Arthritis, Osteoarthritis] Date Acquired: [3:04/01/2019] [N/A:N/A] Weeks of Treatment: [3:0] [N/A:N/A] Wound Status: [3:Open] [N/A:N/A] Measurements L x W x D 4x8x0.1 [N/A:N/A] (cm) Area (cm) : [3:25.133] [N/A:N/A] Volume (cm) : [3:2.513] [N/A:N/A] % Reduction in Area: [3:0.00%] [N/A:N/A] % Reduction in Volume: 0.00% [N/A:N/A] Classification: [3:Full Thickness Without Exposed Support Structures] [N/A:N/A] Exudate Amount: [3:Medium] [N/A:N/A] Exudate Type: [3:Serous] [N/A:N/A] Exudate Color: [3:amber] [N/A:N/A] Wound Margin: [3:Flat and Intact] [N/A:N/A] Granulation Amount: [3:None Present (0%)] [N/A:N/A] Necrotic Amount: [3:Large (67-100%)] [N/A:N/A] Necrotic Tissue: [3:Eschar, Adherent Slough] [N/A:N/A] Exposed Structures: [3:Fascia: No Fat Layer (Subcutaneous Tissue) Exposed: No Tendon: No Muscle: No  Joint: No Bone: No] [N/A:N/A] Epithelialization: [3:None] [N/A:N/A] Debridement: [3:Debridement - Selective/Open Wound] [N/A:N/A] Pre-procedure [3:16:05] [N/A:N/A] Verification/Time Out Taken: Tissue Debrided: [3:Callus] [N/A:N/A] Level: [3:Skin/Epidermis] [N/A:N/A] Debridement Area (sq cm):16 [N/A:N/A] Instrument: [3:Curette] [N/A:N/A] Bleeding: [3:Minimum] [N/A:N/A] Hemostasis Achieved: [3:Pressure] [N/A:N/A] Procedural Pain: [3:0] [N/A:N/A] Post Procedural Pain: [3:0] [N/A:N/A] Debridement Treatment Procedure was tolerated [N/A:N/A] Response: [3:well] Post Debridement [3:4x8x0.1] [N/A:N/A] Measurements L x W x D (cm) Post Debridement [3:2.513]  [N/A:N/A] Volume: (cm) Procedures Performed: Compression Therapy [3:Debridement] [N/A:N/A] Treatment Notes Wound #3 (Right, Medial Lower Leg) 1. Cleanse With Wound Cleanser Soap and water 2. Periwound Care Moisturizing lotion 3. Primary Dressing Applied Hydrofera Blue 4. Secondary Dressing ABD Pad 6. Support Layer Applied 3 layer compression wrap Notes netting. explained the orders, dressings, frequency of change, and when to return to wound center. Electronic Signature(s) Signed: 06/05/2019 6:28:02 PM By: Linton Ham MD Signed: 06/10/2019 6:55:12 PM By: Levan Hurst RN, BSN Entered By: Linton Ham on 06/05/2019 18:10:38 -------------------------------------------------------------------------------- Multi-Disciplinary Care Plan Details Patient Name: Date of Service: Ann Jensen 06/05/2019 2:45 PM Medical Record [id number] Patient Account Number: [id number] Date of Birth/Sex: Treating RN: Sep 03, 1922 (85 y.o. Female) Levan Hurst Primary Care Donavan Kerlin: Other Clinician: Referring Mardie Kellen: Treating Landyn Lorincz/Extender:Robson, Esperanza Richters in Treatment: 0 Active Inactive Abuse / Safety / Falls / Self Care Management Nursing Diagnoses: Potential for falls Potential for injury related to falls Goals: Patient will not experience any injury related to falls Date Initiated: 06/05/2019 Target Resolution Date: 07/03/2019 Goal Status: Active Patient/caregiver will verbalize/demonstrate measures taken to prevent injury and/or falls Date Initiated: 06/05/2019 Target Resolution Date: 07/03/2019 Goal Status: Active Interventions: Assess Activities of Daily Living upon admission and as needed Assess fall risk on admission and as needed Assess: immobility, friction, shearing, incontinence upon admission and as needed Assess impairment of mobility on admission and as needed per policy Assess personal safety and home safety (as indicated) on admission and as needed Assess  self care needs on admission and as needed Provide education on fall prevention Provide education on personal and home safety Notes: Venous Leg Ulcer Nursing Diagnoses: Actual venous Insuffiency (use after diagnosis is confirmed) Goals: Patient will maintain optimal edema control Date Initiated: 06/05/2019 Target Resolution Date: 07/03/2019 Goal Status: Active Interventions: Assess peripheral edema status every visit. Compression as ordered Provide education on venous insufficiency Notes: Wound/Skin Impairment Nursing Diagnoses: Impaired tissue integrity Goals: Patient/caregiver will verbalize understanding of skin care regimen Date Initiated: 06/05/2019 Target Resolution Date: 07/03/2019 Goal Status: Active Ulcer/skin breakdown will have a volume reduction of 30% by week 4 Date Initiated: 06/05/2019 Target Resolution Date: 07/03/2019 Goal Status: Active Interventions: Assess patient/caregiver ability to obtain necessary supplies Assess patient/caregiver ability to perform ulcer/skin care regimen upon admission and as needed Assess ulceration(s) every visit Provide education on ulcer and skin care Notes: Electronic Signature(s) Signed: 06/10/2019 6:55:12 PM By: Levan Hurst RN, BSN Entered By: Levan Hurst on 06/05/2019 18:23:04 -------------------------------------------------------------------------------- Pain Assessment Details Patient Name: Date of Service: Ann Jensen 06/05/2019 2:45 PM Medical Record [id number] Patient Account Number: [id number] Date of Birth/Sex: Treating RN: 1922-04-25 (85 y.o. Female) Carlene Coria Primary Care Kati Riggenbach: Other Clinician: Referring Takiera Mayo: Treating Eryx Zane/Extender:Robson, Esperanza Richters in Treatment: 0 Active Problems Location of Pain Severity and Description of Pain Patient Has Paino No Site Locations Pain Management and Medication Current Pain Management: Electronic Signature(s) Signed: 09/08/2019 3:04:22 PM By:  Carlene Coria RN Entered By: Carlene Coria on 06/05/2019 15:42:00 -------------------------------------------------------------------------------- Patient/Caregiver Education Details Patient Name: Ann Jensen 9/4/2020andnbsp2:45 Date of Service: PM Medical Record  [id number] Number: Patient Account Number: [id number] Treating RN: Date of Birth/Gender: [birth year] (85 y.o. Levan Hurst Female) Other Clinician: Primary Care Treating Linton Ham Physician: Physician/Extender: Referring Physician: Suella Grove in Treatment: 0 Education Assessment Education Provided To: Patient Education Topics Provided Safety: Methods: Explain/Verbal Responses: State content correctly Venous: Methods: Explain/Verbal Responses: State content correctly Wound/Skin Impairment: Methods: Explain/Verbal Responses: State content correctly Electronic Signature(s) Signed: 06/10/2019 6:55:12 PM By: Levan Hurst RN, BSN Entered By: Levan Hurst on 06/05/2019 18:23:19 -------------------------------------------------------------------------------- Wound Assessment Details Patient Name: Date of Service: Ann Jensen 06/05/2019 2:45 PM Medical Record [id number] Patient Account Number: [id number] Date of Birth/Sex: Treating RN: Sep 14, 1922 (85 y.o. Female) Epps, [place] Primary Care Annalea Alguire: Other Clinician: Referring Jesenia Spera: Treating Eames Dibiasio/Extender:Robson, Esperanza Richters in Treatment: 0 Wound Status Wound Number: 3 Primary Venous Leg Ulcer Etiology: Wound Location: Right Lower Leg - Medial Wound Open Wounding Event: Gradually Appeared Status: Date Acquired: 04/01/2019 Comorbid Hypertension, Rheumatoid Arthritis, Weeks Of Treatment: 0 History: Osteoarthritis Clustered Wound: No Photos Wound Measurements Length: (cm) 4 % Reduct Width: (cm) 8 % Reduct Depth: (cm) 0.1 Epitheli Area: (cm) 25.133 Tunneli Volume: (cm) 2.513 Undermi Wound Description Classification: Full Thickness  Without Exposed Support Foul Odo Structures Slough/F Wound Flat and Intact Margin: Exudate Medium Amount: Exudate Serous Type: Exudate amber Color: Wound Bed Granulation Amount: None Present (0%) Necrotic Amount: Large (67-100%) Fascia Exp Necrotic Quality: Eschar, Adherent Slough Fat Layer Tendon Exp Muscle Exp Joint Expo Bone Expos r After Cleansing: No ibrino Yes Exposed Structure osed: No (Subcutaneous Tissue) Exposed: No osed: No osed: No sed: No ed: No ion in Area: 0% ion in Volume: 0% alization: None ng: No ning: No Electronic Signature(s) Signed: 06/11/2019 10:02:41 AM By: Mikeal Hawthorne EMT/HBOT Signed: 09/08/2019 3:04:22 PM By: Carlene Coria RN Entered By: Mikeal Hawthorne on 06/10/2019 14:46:21 -------------------------------------------------------------------------------- Vitals Details Patient Name: Date of Service: Ann Jensen 06/05/2019 2:45 PM Medical Record [id number] Patient Account Number: [id number] Date of Birth/Sex: Treating RN: 12-20-1921 (85 y.o. Female) Epps, [place] Primary Care Gaby Harney: Other Clinician: Referring Ramses Klecka: Treating Gaytha Raybourn/Extender:Robson, Esperanza Richters in Treatment: 0 Vital Signs Time Taken: 15:26 Temperature (F): 98.3 Height (in): 59 Pulse (bpm): 69 Source: Stated Respiratory Rate (breaths/min): 18 Weight (lbs): 141 Blood Pressure (mmHg): 142/84 Body Mass Index (BMI): 28.5 Reference Range: 80 - 120 mg / dl Electronic Signature(s) Signed: 09/08/2019 3:04:22 PM By: Carlene Coria RN Entered By: Carlene Coria on 06/05/2019 15:26:51

## 2019-09-08 NOTE — Assessment & Plan Note (Signed)
No apparent cough, the patient stated her wheezes comes and goes-not new, continue Prednisone taper dose, O2 [place] to maintain Sat O2>90%, pending COVID 09/09/19, Albuterol HFA q6h x 72 hours 09/07/19, CXR 09/07/19 elevated pulmonary vasculature suggested mild CHF, pathcy infiltrate right mid lung consistent with both PNA and CHF. Doxycycline 100mg  bid x 7 days started. Observe.

## 2019-09-08 NOTE — Assessment & Plan Note (Addendum)
Stable, continue prn Xanax, Mirtazapine 7.5mg  qd.

## 2019-09-08 NOTE — Progress Notes (Signed)
Location:   [facility] Room Number: 16 Place of Service:  SNF (31) Provider:  Sedonia Kitner X, NP  Aleksa Catterton X, NP  Patient Care Team: Canio Winokur X, NP as PCP - General (Internal Medicine) Wardell Honour, MD as Attending Physician (Family Medicine)  Extended Emergency Contact Information Primary Emergency Contact: [facility] Address: [place] [address] [place] Phone: [phone] Mobile Phone: [phone] Relation: Son Secondary Emergency Contact: [facility] Address: [address] [place]          [place], [place] [address] [place] Phone: [phone] Relation: Son  Code Status:  DNR Goals of care: Advanced Directive information Advanced Directives 09/07/2019  Does Patient Have a Medical Advance Directive? Yes  Type of Advance Directive Out of facility DNR (pink MOST or yellow form)  Does patient want to make changes to medical advance directive? No - Patient declined  Copy of [place] in Chart? -  Would patient like information on creating a medical advance directive? -  Pre-existing out of facility DNR order (yellow form or pink MOST form) Pink MOST form placed in chart (order not valid for inpatient use)     Chief Complaint  Patient presents with   Acute Visit    Change in condition    HPI:  Pt is a 85 y.o. female seen today for an acute visit for Pneumonia, Prednisone taper dose, O2 [place] to maintain Sat O2>90%, pending COVID 09/09/19, Albuterol HFA q6h x 72 hours 09/07/19, CXR 09/07/19 elevated pulmonary vasculature suggested mild CHF, pathcy infiltrate right mid lung consistent with both PNA and CHF. Doxycycline 100mg  bid x 7 days started.  Lower back pain/sciatica, X-ray showed L5 lumbar vertebra compression, HPOA son, Dr. Sabra Heck is going to check with Ortho, on Gabapentin 100mg  tid,  prn Norco q6h. HTN, blood pressure is controlled on Amlodipine 5mg  qd, Metoprolol  50mg  bid. Anemia, pending CBC, Vit B12. Depression, on Mirtazapine 7.5mg  qd, prn Xanax 0.25mg  hs. RLS on Requip 1mg  qd. Constipation, stable, on Senokot S II qhs. Her goal of care is palliative.    Past Medical History:  Diagnosis Date   Anemia    Hypertension    OA (osteoarthritis)    Osteoporosis    Renal cyst    Past Surgical History:  Procedure Laterality Date   APPENDECTOMY     BLADDER SUSPENSION     CATARACT EXTRACTION     CESAREAN SECTION     REPLACEMENT TOTAL KNEE  1995   TONSILLECTOMY      No Known Allergies  Allergies as of 09/08/2019   No Known Allergies     Medication List       Accurate as of September 08, 2019  2:56 PM. If you have any questions, ask your nurse or doctor.        STOP taking these medications   docusate sodium 100 MG capsule Commonly known as: COLACE Stopped by: Koehn Salehi X Alben Jepsen, NP   prednisoLONE 30 MG disintegrating tablet Commonly known as: ORAPRED ODT Stopped by: Cashel Bellina X Jaysun Wessels, NP     TAKE these medications   acetaminophen 500 MG tablet Commonly known as: TYLENOL Take 1,000 mg by mouth every 8 (eight) hours as needed.   albuterol 108 (90 Base) MCG/ACT inhaler Commonly known as: VENTOLIN HFA Inhale 2 puffs into the lungs every 6 (six) hours as needed for wheezing or shortness of  breath.   ALPRAZolam 0.25 MG tablet Commonly known as: XANAX TAKE 1 TABLET AT BEDTIME AS NEEDED FOR ANXIETY OR SLEEP   amLODipine 5 MG tablet Commonly known as: NORVASC Take 1 tablet (5 mg total) by mouth daily.   aspirin EC 81 MG tablet Take 81 mg by mouth. Once A Morning on Mon, Thu   Biotin 5000 MCG Tabs Take 5,000 mcg by mouth daily.   doxycycline 100 MG tablet Commonly known as: VIBRA-TABS Take 100 mg by mouth 2 (two) times daily.   gabapentin 100 MG capsule Commonly known as: NEURONTIN Take 1 capsule (100 mg total) by mouth 3 (three) times daily.   HYDROcodone-acetaminophen 5-325 MG tablet Commonly known as: NORCO/VICODIN Take 1  tablet by mouth every 6 (six) hours as needed for severe pain.   metoprolol tartrate 50 MG tablet Commonly known as: LOPRESSOR TAKE 1 TABLET BY MOUTH TWICE DAILY.   mirtazapine 15 MG tablet Commonly known as: REMERON Take 7.5 mg by mouth at bedtime.   multivitamin-lutein Caps capsule Take 1 capsule by mouth 2 (two) times daily.   PREDNISONE PO Take 40 mg by mouth. x 2 days. Once A Morning   PREDNISONE PO Take 30 mg by mouth. x 2days. Once A Morning Start taking on: September 10, 2019   predniSONE 20 MG tablet Commonly known as: DELTASONE Take 20 mg by mouth daily with breakfast. Start taking on: September 12, 2019   predniSONE 10 MG tablet Commonly known as: DELTASONE Take 10 mg by mouth daily with breakfast. Start taking on: September 14, 2019   PREDNISONE PO Take 5 mg by mouth. x 2 days Once A Morning Start taking on: September 16, 2019   rOPINIRole 1 MG tablet Commonly known as: REQUIP TAKE 1 TABLET EACH EVENING.   saccharomyces boulardii 250 MG capsule Commonly known as: FLORASTOR Take 250 mg by mouth 2 (two) times daily.   sennosides-docusate sodium 8.6-50 MG tablet Commonly known as: SENOKOT-S Take 2 tablets by mouth daily.   Tubersol 5 UNIT/0.1ML injection Generic drug: tuberculin Inject into the skin once. Once A Day Every 14 Days   Vitamin D 50 MCG (2000 UT) Caps Take 2,000 Units by mouth daily.   zinc oxide 20 % ointment Apply 1 application topically as needed for irritation.       Review of Systems  Constitutional: Negative for activity change, appetite change, chills, diaphoresis, fatigue and fever.  HENT: Positive for hearing loss. Negative for congestion and voice change.   Respiratory: Positive for shortness of breath. Negative for cough.   Cardiovascular: Positive for leg swelling. Negative for chest pain and palpitations.  Gastrointestinal: Negative for abdominal distention, abdominal pain, constipation, diarrhea, nausea and vomiting.    Genitourinary: Negative for difficulty urinating, dysuria and urgency.  Musculoskeletal: Positive for arthralgias, back pain, gait problem and joint swelling.  Skin: Positive for color change.  Neurological: Negative for dizziness, speech difficulty, weakness and headaches.  Psychiatric/Behavioral: Negative for agitation, behavioral problems, hallucinations and sleep disturbance. The patient is not nervous/anxious.     Immunization History  Administered Date(s) Administered   Influenza, High Dose Seasonal PF 07/10/2017, 07/15/2019   Influenza,inj,Quad PF,6+ Mos 07/03/2018   Influenza-Unspecified 07/10/2017   Pneumococcal Conjugate-13 08/18/2014   Pneumococcal Polysaccharide-23 07/26/2010   Tdap 11/12/2018   Pertinent  Health Maintenance Due  Topic Date Due   DEXA SCAN  07/22/2020 (Originally [birth year]1989)   INFLUENZA VACCINE  Completed   PNA vac Low Risk Adult  Completed   Fall Risk  08/12/2019 07/23/2019 07/15/2019 04/15/2019 09/02/2018  Falls in the past year? 0 0 0 0 0  Number falls in past yr: 0 0 0 0 0  Injury with Fall? - 0 - 0 0   Functional Status Survey:    Vitals:   09/08/19 0851  BP: 123/72  Pulse: 72  Resp: 18  Temp: 97.9 F (36.6 C)  SpO2: 96%  Weight: 136 lb (61.7 kg)  Height: 5\' 1"  (1.549 m)   Body mass index is 25.7 kg/m. Physical Exam Vitals signs and nursing note reviewed.  Constitutional:      General: She is not in acute distress.    Appearance: Normal appearance. She is not ill-appearing, toxic-appearing or diaphoretic.  HENT:     Head: Normocephalic and atraumatic.     Nose: Nose normal.     Mouth/Throat:     Mouth: Mucous membranes are moist.  Eyes:     Extraocular Movements: Extraocular movements intact.     Conjunctiva/sclera: Conjunctivae normal.     Pupils: Pupils are equal, round, and reactive to light.  Neck:     Musculoskeletal: Normal range of motion and neck supple.  Cardiovascular:     Rate and Rhythm: Normal rate and  regular rhythm.     Heart sounds: Murmur present.  Pulmonary:     Breath sounds: No wheezing, rhonchi or rales.     Comments: O2 2lpm via [place] Abdominal:     General: Bowel sounds are normal. There is no distension.     Palpations: Abdomen is soft.     Tenderness: There is no abdominal tenderness. There is no right CVA tenderness, left CVA tenderness, guarding or rebound.  Musculoskeletal:     Right lower leg: Edema present.     Left lower leg: Edema present.     Comments: 1+ edema BLE. Right knee ecchymoses, slightly swelling, no warmth, there is catching with the right knee flexion. Right right ankle/foot is turned laterally.   Skin:    General: Skin is warm and dry.     Findings: Bruising present.     Comments: Bruise in the right knee area.   Neurological:     General: No focal deficit present.     Mental Status: She is alert and oriented to person, place, and time. Mental status is at baseline.     Cranial Nerves: No cranial nerve deficit.     Motor: No weakness.     Coordination: Coordination normal.     Gait: Gait abnormal.  Psychiatric:        Mood and Affect: Mood normal.        Behavior: Behavior normal.        Thought Content: Thought content normal.        Judgment: Judgment normal.     Labs reviewed: Recent Labs    01/01/19 0800 03/05/19 0810 07/23/19 0000  NA 139 140 143  K 3.6 3.4* 3.9  CL 99 99 102  CO2 31 31 28   GLUCOSE 89 84 78  BUN 27* 30* 29*  CREATININE 1.19* 1.14* 1.12*  CALCIUM 9.2 9.4 9.4   Recent Labs    01/01/19 0800 03/05/19 0810 07/23/19 0000  AST 16 17 17   ALT 9 8 9   BILITOT 0.5 0.6 0.7  PROT 6.5 6.6 6.8   Recent Labs    01/01/19 0800 03/05/19 0810 07/23/19 0000  WBC 6.1 6.3 5.6  NEUTROABS 3,093 3,213 2,699  HGB 12.8 13.0 12.8  HCT 37.8 38.9 38.4  MCV 100.5* 100.3* 101.9*  PLT 211 195 176   Lab Results  Component Value Date   TSH 1.54 07/23/2019   No results found for: HGBA1C Lab Results  Component Value Date    CHOL 175 07/23/2019   HDL 54 07/23/2019   LDLCALC 105 (H) 07/23/2019   TRIG 74 07/23/2019   CHOLHDL 3.2 07/23/2019    Significant Diagnostic Results in last 30 days:  No results found.  Assessment/Plan Community acquired pneumonia of right lung No apparent cough, the patient stated her wheezes comes and goes-not new, continue Prednisone taper dose, O2 [place] to maintain Sat O2>90%, pending COVID 09/09/19, Albuterol HFA q6h x 72 hours 09/07/19, CXR 09/07/19 elevated pulmonary vasculature suggested mild CHF, pathcy infiltrate right mid lung consistent with both PNA and CHF. Doxycycline 100mg  bid x 7 days started. Observe.   Essential hypertension  blood pressure is controlled, continue Amlodipine 5mg  qd, Metoprolol 50mg  bid.  PVD (peripheral vascular disease) (HCC) Chronic, 1+ edema BLE, TED, off Furosemide, repeat CXR 6 weeks, observe for s/s of developing CHF  Contusion of right knee Ecchymoses presents, there is catching motion with knee flexion, X-ray after the contusion showed no acute fx, long standing not bearing full weight on the right leg. Will have therapy to eval for knee immobilizer for symptomatic management.   Closed compression fracture of L5 lumbar vertebra, sequela Lower back pain/sciatica, X-ray showed L5 lumbar vertebra compression, HPOA son, Dr. Sabra Heck is going to check with Ortho, on Gabapentin 100mg  tid,  prn Norco q6h.  Anemia pending CBC, Vit B12.  RLS (restless legs syndrome) Stable, continue Requip  Insomnia secondary to anxiety Stable, continue prn Xanax, Mirtazapine 7.5mg  qd.   Slow transit constipation Stable, continue Senokot S II qd.      Family/ staff Communication: plan of care reviewed with the patient and charge nurse.   Labs/tests ordered:  F/u CXR 6 weeks.   Time spend 25 minutes.

## 2019-09-08 NOTE — Progress Notes (Signed)
Ann, Jensen ([id number]) Visit Report for 06/05/2019 Debridement Details Patient Name: Date of Service: Ann Jensen, Ann Jensen 06/05/2019 2:45 PM Medical Record [id number] Patient Account Number: [id number] Date of Birth/Sex: 1922/03/23 (85 y.o. Female) Treating RN: Levan Hurst Primary Care Provider: Other Clinician: Referring Provider: Treating Provider/Extender:, Esperanza Richters in Treatment: 0 Debridement Performed for Wound #3 Right,Medial Lower Leg Assessment: Performed By: Physician Ricard Dillon., MD Debridement Type: Debridement Severity of Tissue Pre Fat layer exposed Debridement: Level of Consciousness (Pre- Awake and Alert procedure): Pre-procedure Verification/Time Out Taken: Yes - 16:05 Start Time: 16:05 Total Area Debrided (L x W): 4 (cm) x 4 (cm) = 16 (cm) Tissue and other material Non-Viable, Callus, Skin: Epidermis debrided: Level: Skin/Epidermis Debridement Description: Selective/Open Wound Instrument: Curette Bleeding: Minimum Hemostasis Achieved: Pressure End Time: 16:06 Procedural Pain: 0 Post Procedural Pain: 0 Response to Treatment: Procedure was tolerated well Level of Consciousness Awake and Alert (Post-procedure): Post Debridement Measurements of Total Wound Length: (cm) 4 Width: (cm) 8 Depth: (cm) 0.1 Volume: (cm) 2.513 Character of Wound/Ulcer Post Improved Debridement: Severity of Tissue Post Debridement: Fat layer exposed Post Procedure Diagnosis Same as Pre-procedure Electronic Signature(s) Signed: 06/05/2019 6:28:02 PM By: Linton Ham MD Signed: 06/10/2019 6:55:12 PM By: Levan Hurst RN, BSN Entered By: Linton Ham on 06/05/2019 18:10:49 -------------------------------------------------------------------------------- HPI Details Patient Name: Date of Service: Ann Jensen 06/05/2019 2:45 PM Medical Record [id number] Patient Account Number: [id number] Date of Birth/Sex: August 04, 1922 (85 y.o. Female)  Treating RN: Levan Hurst Primary Care Provider: Other Clinician: Referring Provider: Treating Provider/Extender:, Esperanza Richters in Treatment: 0 History of Present Illness HPI Description: ADMISSION 12/01/2018 This is a 49 year old woman from the independent part of [facility] [place]. Her son is Dr. Teodoro Spray who is a primary care physician in town. Mrs. Dyas is a minimal ambulator secondary to difficulties with her right knee. She has a scooter. On 2/12 she managed to strike her anterior right tibia area on the refrigerator. This caused a large laceration. This was sutured at [facility], ER. She has a large irregular suture line that is still in place. Smaller area inferiorly and a smaller area still on the left. The only wound that is currently open is on the right mid tibial area. They have been using dry gauze. The patient is not a diabetic however she has significant chronic venous insufficiency with probably some degree of secondary lymphedema. She does not use compression. Past medical history includes hypertension, stage III chronic renal failure, macrocytic anemia, hyperlipidemia, peripheral vascular disease, osteoporosis, venous insufficiency and a history of a right total knee replacement that his left her with a very unstable knee on the right side. She was not felt to be eligible for a re-replacement. 3/9; the patient's son removed her sutures. Most of this remains closed. She has a necrotic area anteriorly from last week and a dehiscence on the right medial lower calf area at the medial part of the laceration here. She has been using silver alginate. Her daughter-in-law who is a Equities trader has been changing the compression. 3/23; the patient has 2 wound areas on the right anterior tibial area and the lower wound on the right medial calf. Both of these look better they have come in in terms of wound area. Using silver alginate under compression that her  daughter-in-law is changing [registered nurse] 4/6; 2 wounds one on the right anterior tibia and the lower wound on the right medial calf just above the ankle. The distal wound has some depth.  We have been using silver alginate. Her son sent me some pictures last week showing intense erythema around the wounds compatible with cellulitis. There was some tenderness. I have prescribed doxycycline remotely. She comes in today with the erythema considerably better 4/20; 2-week follow-up. Wound on the right anterior tibia and the lower wound on the right medial calf. Both of these were traumatic in the setting of chronic venous insufficiency and lymphedema. She has been using silver collagen although her son who is a physician tells me they changed to silver alginate on the upper wound because of drainage. The patient's daughter-in-law who is an Therapist, sports is changing the dressings which include a 4 layer compression wrap 5/4; 2-week follow-up. Wound on the right anterior tibia and the lower wound on the right medial calf both look better dimensions better. There is some hyper granulation superiorly that may need to be addressed I did not do that today. Using silver alginate. They have a single juxta lite stocking but they will order another one. They can start using that juxta lite stocking they have on the left leg 5/18-2-week follow-up-wound on right anterior tibia looks healed, the right medial calf wound is getting better. Continue to use silver alginate. At this point want to use a collagen over the next week. Juxta lite stocking to the left and 3 layer compression to the right. READMISSION 06/05/2019 Dr. Sabra Heck brings his mother in today for review of the skin on her lower leg on the right apparently there has been a recurrent area which is been roughly the same condition of the medial right lower leg wound that we dealt with in the spring. The major wound anteriorly has remained healed. Apparently Dr.  Sabra Heck has been debriding this himself. She has had at least 2 rounds of antibiotics prescribed by the medical staff at [facility] [place]. They have her in 3 layer compression with some calcium alginate. He has tried other dressings. The patient is no longer ambulatory secondary to knee issues. We discharged her with bilateral juxta lites but they have not been able to get this on her completely reliably in the independent apartment where she lives she is able to do the one on the left herself. Electronic Signature(s) Signed: 06/05/2019 6:28:02 PM By: Linton Ham MD Entered By: Linton Ham on 06/05/2019 18:14:59 -------------------------------------------------------------------------------- Physical Exam Details Patient Name: Date of Service: Ann Jensen 06/05/2019 2:45 PM Medical Record [id number] Patient Account Number: [id number] Date of Birth/Sex: April 27, 1922 (85 y.o. Female) Treating RN: Levan Hurst Primary Care Provider: Other Clinician: Referring Provider: Treating Provider/Extender:, Esperanza Richters in Treatment: 0 Constitutional Sitting or standing Blood Pressure is within target range for patient.. Pulse regular and within target range for patient.Marland Kitchen Respirations regular, non-labored and within target range.. Temperature is normal and within the target range for the patient.Marland Kitchen Appears in no distress. Eyes Conjunctivae clear. No discharge.no icterus. Cardiovascular Pedal pulses are palpable. Psychiatric appears at normal baseline. Notes Wound exam; area questions on the right medial lower leg. This is a area of flaky dry denuded epithelium. Using a #5 curette I remove this there is healthy looking tissue underneath this but without overlying epidermis. The area looks irritated somewhat erythematous but not particularly painful. Electronic Signature(s) Signed: 06/05/2019 6:28:02 PM By: Linton Ham MD Entered By: Linton Ham on 06/05/2019  18:13:41 -------------------------------------------------------------------------------- Physician Orders Details Patient Name: Date of Service: Ann Jensen 06/05/2019 2:45 PM Medical Record [id number] Patient Account Number: [id number] Date of Birth/Sex: 05/28/1922 (85 y.o. Female) Treating  RN: Levan Hurst Primary Care Provider: Other Clinician: Referring Provider: Treating Provider/Extender:, Esperanza Richters in Treatment: 0 Verbal / Phone Orders: No Diagnosis Coding Follow-up Appointments Return Appointment in 2 weeks. Dressing Change Frequency Wound #3 Right,Medial Lower Leg Other: - 2x a week Skin Barriers/Peri-Wound Care Wound #3 Right,Medial Lower Leg Moisturizing lotion TCA Cream or Ointment Wound Cleansing May shower with protection. Primary Wound Dressing Wound #3 Right,Medial Lower Leg Hydrofera Blue Secondary Dressing Wound #3 Right,Medial Lower Leg ABD pad Edema Control 3 Layer Compression System - Right Lower Extremity Elevate legs to the level of the heart or above for 30 minutes daily and/or when sitting, a frequency of: - throughout the day Patient Medications Allergies: No Known Allergies Notifications Medication Indication Start End triamcinolone acetonide 06/05/2019 DOSE topical 0.1 % cream - cream topical 1:4 in cetaphil cream to right leg with dressing changes Electronic Signature(s) Signed: 06/05/2019 4:15:06 PM By: Linton Ham MD Entered By: Linton Ham on 06/05/2019 16:15:06 -------------------------------------------------------------------------------- Problem List Details Patient Name: Date of Service: Ann Jensen 06/05/2019 2:45 PM Medical Record [id number] Patient Account Number: [id number] Date of Birth/Sex: 1922/01/08 (85 y.o. Female) Treating RN: Levan Hurst Primary Care Provider: Other Clinician: Referring Provider: Treating Provider/Extender:, Esperanza Richters in Treatment: 0 Active  Problems ICD-10 Evaluated Encounter Code Description Active Date Today Diagnosis I87.331 Chronic venous hypertension (idiopathic) with ulcer 06/05/2019 No Yes and inflammation of right lower extremity I89.0 Lymphedema, not elsewhere classified 06/05/2019 No Yes L97.811 Non-pressure chronic ulcer of other part of right lower 06/05/2019 No Yes leg limited to breakdown of skin Inactive Problems Resolved Problems Electronic Signature(s) Signed: 06/05/2019 6:28:02 PM By: Linton Ham MD Entered By: Linton Ham on 06/05/2019 18:06:58 -------------------------------------------------------------------------------- Progress Note Details Patient Name: Date of Service: Ann Jensen 06/05/2019 2:45 PM Medical Record [id number] Patient Account Number: [id number] Date of Birth/Sex: 07/19/1922 (85 y.o. Female) Treating RN: Levan Hurst Primary Care Provider: Other Clinician: Referring Provider: Treating Provider/Extender:, Esperanza Richters in Treatment: 0 Subjective History of Present Illness (HPI) ADMISSION 12/01/2018 This is a 5 year old woman from the independent part of [facility] [place]. Her son is Dr. Teodoro Spray who is a primary care physician in town. Mrs. Drye is a minimal ambulator secondary to difficulties with her right knee. She has a scooter. On 2/12 she managed to strike her anterior right tibia area on the refrigerator. This caused a large laceration. This was sutured at [facility], ER. She has a large irregular suture line that is still in place. Smaller area inferiorly and a smaller area still on the left. The only wound that is currently open is on the right mid tibial area. They have been using dry gauze. The patient is not a diabetic however she has significant chronic venous insufficiency with probably some degree of secondary lymphedema. She does not use compression. Past medical history includes hypertension, stage III chronic renal failure, macrocytic  anemia, hyperlipidemia, peripheral vascular disease, osteoporosis, venous insufficiency and a history of a right total knee replacement that his left her with a very unstable knee on the right side. She was not felt to be eligible for a re-replacement. 3/9; the patient's son removed her sutures. Most of this remains closed. She has a necrotic area anteriorly from last week and a dehiscence on the right medial lower calf area at the medial part of the laceration here. She has been using silver alginate. Her daughter-in-law who is a Equities trader has been changing the compression. 3/23; the patient has 2 wound areas on the  right anterior tibial area and the lower wound on the right medial calf. Both of these look better they have come in in terms of wound area. Using silver alginate under compression that her daughter-in-law is changing [registered nurse] 4/6; 2 wounds one on the right anterior tibia and the lower wound on the right medial calf just above the ankle. The distal wound has some depth. We have been using silver alginate. ooHer son sent me some pictures last week showing intense erythema around the wounds compatible with cellulitis. There was some tenderness. I have prescribed doxycycline remotely. She comes in today with the erythema considerably better 4/20; 2-week follow-up. Wound on the right anterior tibia and the lower wound on the right medial calf. Both of these were traumatic in the setting of chronic venous insufficiency and lymphedema. She has been using silver collagen although her son who is a physician tells me they changed to silver alginate on the upper wound because of drainage. The patient's daughter-in-law who is an Therapist, sports is changing the dressings which include a 4 layer compression wrap 5/4; 2-week follow-up. Wound on the right anterior tibia and the lower wound on the right medial calf both look better dimensions better. There is some hyper granulation superiorly  that may need to be addressed I did not do that today. Using silver alginate. They have a single juxta lite stocking but they will order another one. They can start using that juxta lite stocking they have on the left leg 5/18-2-week follow-up-wound on right anterior tibia looks healed, the right medial calf wound is getting better. Continue to use silver alginate. At this point want to use a collagen over the next week. Juxta lite stocking to the left and 3 layer compression to the right. READMISSION 06/05/2019 Dr. Sabra Heck brings his mother in today for review of the skin on her lower leg on the right apparently there has been a recurrent area which is been roughly the same condition of the medial right lower leg wound that we dealt with in the spring. The major wound anteriorly has remained healed. Apparently Dr. Sabra Heck has been debriding this himself. She has had at least 2 rounds of antibiotics prescribed by the medical staff at [facility] [place]. They have her in 3 layer compression with some calcium alginate. He has tried other dressings. The patient is no longer ambulatory secondary to knee issues. We discharged her with bilateral juxta lites but they have not been able to get this on her completely reliably in the independent apartment where she lives she is able to do the one on the left herself. Patient History Unable to Obtain Patient History due to Altered Mental Status. Information obtained from Patient. Allergies No Known Allergies Family History Cancer - Mother, Heart Disease - Father, Hypertension - Mother,Father, No family history of Diabetes, Hereditary Spherocytosis, Kidney Disease, Lung Disease, Seizures, Stroke, Thyroid Problems, Tuberculosis. Social History Never smoker, Marital Status - Widowed, Alcohol Use - Rarely, Drug Use - No History, Caffeine Use - Daily. Medical History Eyes Denies history of Cataracts, Glaucoma, Optic Neuritis Ear/Nose/Mouth/Throat Denies  history of Chronic sinus problems/congestion, Middle ear problems Hematologic/Lymphatic Denies history of Anemia, Hemophilia, Human Immunodeficiency Virus, Lymphedema, Sickle Cell Disease Respiratory Denies history of Aspiration, Asthma, Chronic Obstructive Pulmonary Disease (COPD), Pneumothorax, Sleep Apnea, Tuberculosis Cardiovascular Patient has history of Hypertension Denies history of Angina, Arrhythmia, Congestive Heart Failure, Coronary Artery Disease, Deep Vein Thrombosis, Hypotension, Myocardial Infarction, Peripheral Arterial Disease, Peripheral Venous Disease, Phlebitis, Vasculitis Gastrointestinal Denies  history of Cirrhosis , Colitis, Crohnoos, Hepatitis A, Hepatitis B, Hepatitis C Endocrine Denies history of Type I Diabetes, Type II Diabetes Genitourinary Denies history of End Stage Renal Disease Immunological Denies history of Lupus Erythematosus, Raynaudoos, Scleroderma Integumentary (Skin) Denies history of History of Burn Musculoskeletal Patient has history of Rheumatoid Arthritis, Osteoarthritis Denies history of Gout, Osteomyelitis Neurologic Denies history of Dementia, Neuropathy, Quadriplegia, Paraplegia, Seizure Disorder Oncologic Denies history of Received Chemotherapy, Received Radiation Psychiatric Denies history of Anorexia/bulimia, Confinement Anxiety Hospitalization/Surgery History - [place]. Review of Systems (ROS) Constitutional Symptoms (General Health) Denies complaints or symptoms of Fatigue, Fever, Chills, Marked Weight Change. Eyes Denies complaints or symptoms of Dry Eyes, Vision Changes, Glasses / Contacts. Ear/Nose/Mouth/Throat Denies complaints or symptoms of Chronic sinus problems or rhinitis. Respiratory Denies complaints or symptoms of Chronic or frequent coughs, Shortness of Breath. Cardiovascular Denies complaints or symptoms of Chest pain. Gastrointestinal Denies complaints or symptoms of Frequent diarrhea, Nausea,  Vomiting. Endocrine Denies complaints or symptoms of Heat/cold intolerance. Genitourinary Denies complaints or symptoms of Frequent urination. Integumentary (Skin) Denies complaints or symptoms of Wounds. Musculoskeletal Denies complaints or symptoms of Muscle Pain, Muscle Weakness. Neurologic Denies complaints or symptoms of Numbness/parasthesias. Psychiatric Denies complaints or symptoms of Claustrophobia, Suicidal. Objective Constitutional Sitting or standing Blood Pressure is within target range for patient.. Pulse regular and within target range for patient.Marland Kitchen Respirations regular, non-labored and within target range.. Temperature is normal and within the target range for the patient.Marland Kitchen Appears in no distress. Vitals Time Taken: 3:26 PM, Height: 59 in, Source: Stated, Weight: 141 lbs, BMI: 28.5, Temperature: 98.3 F, Pulse: 69 bpm, Respiratory Rate: 18 breaths/min, Blood Pressure: 142/84 mmHg. Eyes Conjunctivae clear. No discharge.no icterus. Cardiovascular Pedal pulses are palpable. Psychiatric appears at normal baseline. General Notes: Wound exam; area questions on the right medial lower leg. This is a area of flaky dry denuded epithelium. Using a #5 curette I remove this there is healthy looking tissue underneath this but without overlying epidermis. The area looks irritated somewhat erythematous but not particularly painful. Integumentary (Hair, Skin) Wound #3 status is Open. Original cause of wound was Gradually Appeared. The wound is located on the Right,Medial Lower Leg. The wound measures 4cm length x 8cm width x 0.1cm depth; 25.133cm^2 area and 2.513cm^3 volume. There is no tunneling or undermining noted. There is a medium amount of serous drainage noted. The wound margin is flat and intact. There is no granulation within the wound bed. There is a large (67-100%) amount of necrotic tissue within the wound bed including Eschar and Adherent Slough. Assessment Active  Problems ICD-10 Chronic venous hypertension (idiopathic) with ulcer and inflammation of right lower extremity Lymphedema, not elsewhere classified Non-pressure chronic ulcer of other part of right lower leg limited to breakdown of skin Procedures Wound #3 Pre-procedure diagnosis of Wound #3 is a Venous Leg Ulcer located on the Right,Medial Lower Leg .Severity of Tissue Pre Debridement is: Fat layer exposed. There was a Selective/Open Wound Skin/Epidermis Debridement with a total area of 16 sq cm performed by Ricard Dillon., MD. With the following instrument(s): Curette to remove Non-Viable tissue/material. Material removed includes Callus and Skin: Epidermis and. No specimens were taken. A time out was conducted at 16:05, prior to the start of the procedure. A Minimum amount of bleeding was controlled with Pressure. The procedure was tolerated well with a pain level of 0 throughout and a pain level of 0 following the procedure. Post Debridement Measurements: 4cm length x 8cm width x 0.1cm depth;  2.513cm^3 volume. Character of Wound/Ulcer Post Debridement is improved. Severity of Tissue Post Debridement is: Fat layer exposed. Post procedure Diagnosis Wound #3: Same as Pre-Procedure Pre-procedure diagnosis of Wound #3 is a Venous Leg Ulcer located on the Right,Medial Lower Leg . There was a Three Layer Compression Therapy Procedure by Levan Hurst, RN. Post procedure Diagnosis Wound #3: Same as Pre-Procedure Plan Follow-up Appointments: Return Appointment in 2 weeks. Dressing Change Frequency: Wound #3 Right,Medial Lower Leg: Other: - 2x a week Skin Barriers/Peri-Wound Care: Wound #3 Right,Medial Lower Leg: Moisturizing lotion TCA Cream or Ointment Wound Cleansing: May shower with protection. Primary Wound Dressing: Wound #3 Right,Medial Lower Leg: Hydrofera Blue Secondary Dressing: Wound #3 Right,Medial Lower Leg: ABD pad Edema Control: 3 Layer Compression System - Right  Lower Extremity Elevate legs to the level of the heart or above for 30 minutes daily and/or when sitting, a frequency of: - throughout the day The following medication(s) was prescribed: triamcinolone acetonide topical 0.1 % cream cream topical 1:4 in cetaphil cream to right leg with dressing changes starting 06/05/2019 1. I change the primary dressing to [facility] I had a thought that this might be some form of irritant contact dermatitis so I did not want him to use alginate. 2 we coated the entire area and TCA and I gave him a prescription for triamcinolone/Cetaphil 1/4 to the lower leg with dressing changes. She has very dry irritated skin but I am not sure that this explains the presentation. 3. Her edema control is not bad. I think we could increase her to 4 layer compression if necessary but I am not sure that this explains the presentation either. Electronic Signature(s) Signed: 06/05/2019 6:28:02 PM By: Linton Ham MD Entered By: Linton Ham on 06/05/2019 18:16:25 -------------------------------------------------------------------------------- HxROS Details Patient Name: Date of Service: Ann Jensen 06/05/2019 2:45 PM Medical Record [id number] Patient Account Number: [id number] Date of Birth/Sex: 01-Dec-1921 (85 y.o. Female) Treating RN: Carlene Coria Primary Care Provider: Other Clinician: Referring Provider: Treating Provider/Extender:, Esperanza Richters in Treatment: 0 Unable to Obtain Patient History due to Altered Mental Status Information Obtained From Patient Constitutional Symptoms (General Health) Complaints and Symptoms: Negative for: Fatigue; Fever; Chills; Marked Weight Change Eyes Complaints and Symptoms: Negative for: Dry Eyes; Vision Changes; Glasses / Contacts Medical History: Negative for: Cataracts; Glaucoma; Optic Neuritis Ear/Nose/Mouth/Throat Complaints and Symptoms: Negative for: Chronic sinus problems or rhinitis Medical  History: Negative for: Chronic sinus problems/congestion; Middle ear problems Respiratory Complaints and Symptoms: Negative for: Chronic or frequent coughs; Shortness of Breath Medical History: Negative for: Aspiration; Asthma; Chronic Obstructive Pulmonary Disease (COPD); Pneumothorax; Sleep Apnea; Tuberculosis Cardiovascular Complaints and Symptoms: Negative for: Chest pain Medical History: Positive for: Hypertension Negative for: Angina; Arrhythmia; Congestive Heart Failure; Coronary Artery Disease; Deep Vein Thrombosis; Hypotension; Myocardial Infarction; Peripheral Arterial Disease; Peripheral Venous Disease; Phlebitis; Vasculitis Gastrointestinal Complaints and Symptoms: Negative for: Frequent diarrhea; Nausea; Vomiting Medical History: Negative for: Cirrhosis ; Colitis; Crohns; Hepatitis A; Hepatitis B; Hepatitis C Endocrine Complaints and Symptoms: Negative for: Heat/cold intolerance Medical History: Negative for: Type I Diabetes; Type II Diabetes Genitourinary Complaints and Symptoms: Negative for: Frequent urination Medical History: Negative for: End Stage Renal Disease Integumentary (Skin) Complaints and Symptoms: Negative for: Wounds Medical History: Negative for: History of Burn Musculoskeletal Complaints and Symptoms: Negative for: Muscle Pain; Muscle Weakness Medical History: Positive for: Rheumatoid Arthritis; Osteoarthritis Negative for: Gout; Osteomyelitis Neurologic Complaints and Symptoms: Negative for: Numbness/parasthesias Medical History: Negative for: Dementia; Neuropathy; Quadriplegia; Paraplegia; Seizure Disorder Psychiatric Complaints and Symptoms:  Negative for: Claustrophobia; Suicidal Medical History: Negative for: Anorexia/bulimia; Confinement Anxiety Hematologic/Lymphatic Medical History: Negative for: Anemia; Hemophilia; Human Immunodeficiency Virus; Lymphedema; Sickle Cell Disease Immunological Medical History: Negative for:  Lupus Erythematosus; Raynauds; Scleroderma Oncologic Medical History: Negative for: Received Chemotherapy; Received Radiation Immunizations Pneumococcal Vaccine: Received Pneumococcal Vaccination: Yes Tetanus Vaccine: Last tetanus shot: 11/02/2018 Implantable Devices None Hospitalization / Surgery History Type of Hospitalization/Surgery [place] Family and Social History Cancer: Yes - Mother; Diabetes: No; Heart Disease: Yes - Father; Hereditary Spherocytosis: No; Hypertension: Yes - Mother,Father; Kidney Disease: No; Lung Disease: No; Seizures: No; Stroke: No; Thyroid Problems: No; Tuberculosis: No; Never smoker; Marital Status - Widowed; Alcohol Use: Rarely; Drug Use: No History; Caffeine Use: Daily; Financial Concerns: No; Food, Clothing or Shelter Needs: No; Support System Lacking: No; Transportation Concerns: No Electronic Signature(s) Signed: 06/05/2019 6:28:02 PM By: Linton Ham MD Signed: 09/08/2019 3:04:22 PM By: Carlene Coria RN Entered By: Carlene Coria on 06/05/2019 15:27:41 -------------------------------------------------------------------------------- SuperBill Details Patient Name: Date of Service: Ann Jensen 06/05/2019 Medical Record [id number] Patient Account Number: [id number] Date of Birth/Sex: Treating RN: Nov 13, 1921 (85 y.o. Female) Levan Hurst Primary Care Provider: Other Clinician: Referring Provider: Treating Provider/Extender:, Esperanza Richters in Treatment: 0 Diagnosis Coding ICD-10 Codes Code Description I87.331 Chronic venous hypertension (idiopathic) with ulcer and inflammation of right lower extremity I89.0 Lymphedema, not elsewhere classified L97.811 Non-pressure chronic ulcer of other part of right lower leg limited to breakdown of skin Facility Procedures CPT4: Code [id number] Description: [place] VISIT-LEV 3 EST PT Modifier: 25 Quantity: 1 CPT4: [id number] Description: 59 - DEBRIDE WOUND 1ST 20 SQ CM OR <  ICD-10 Diagnosis Description L97.811 Non-pressure chronic ulcer of other part of right lower leg skin I89.0 Lymphedema, not elsewhere classified I87.331 Chronic venous hypertension (idiopathic) with  ulcer and infl extremity Modifier: limited to bre ammation of ri Quantity: 1 akdown of ght lower Physician Procedures CPT4: Code [id number] Description: 213 - WC PHYS LEVEL 3 - EST PT ICD-10 Diagnosis Description I87.331 Chronic venous hypertension (idiopathic) with ulcer and inf extremity I89.0 Lymphedema, not elsewhere classified L97.811 Non-pressure chronic ulcer of other part of right  lower leg skin Modifier: 25 lammation of ri limited to bre Quantity: 1 ght lower akdown of CPT4: [id number] Description: [place] - WC PHYS DEBR WO ANESTH 20 SQ CM ICD-10 Diagnosis Description L97.811 Non-pressure chronic ulcer of other part of right lower leg skin I89.0 Lymphedema, not elsewhere classified I87.331 Chronic venous hypertension (idiopathic) with  ulcer and inf extremity Modifier: limited to bre lammation of ri Quantity: 1 akdown of ght lower Electronic Signature(s) Signed: 06/05/2019 6:28:02 PM By: Linton Ham MD Signed: 06/10/2019 6:55:12 PM By: Levan Hurst RN, BSN Entered By: Levan Hurst on 06/05/2019 18:24:22

## 2019-09-08 NOTE — Assessment & Plan Note (Signed)
blood pressure is controlled, continue Amlodipine 5mg  qd, Metoprolol 50mg  bid.

## 2019-09-08 NOTE — Assessment & Plan Note (Signed)
Chronic, 1+ edema BLE, TED, off Furosemide, repeat CXR 6 weeks, observe for s/s of developing CHF

## 2019-09-11 ENCOUNTER — Non-Acute Institutional Stay: Payer: PPO | Admitting: Licensed Clinical Social Worker

## 2019-09-11 DIAGNOSIS — Z515 Encounter for palliative care: Secondary | ICD-10-CM

## 2019-09-14 ENCOUNTER — Non-Acute Institutional Stay (SKILLED_NURSING_FACILITY): Payer: PPO | Admitting: Internal Medicine

## 2019-09-14 ENCOUNTER — Encounter: Payer: Self-pay | Admitting: Internal Medicine

## 2019-09-14 ENCOUNTER — Other Ambulatory Visit: Payer: Self-pay

## 2019-09-14 DIAGNOSIS — I10 Essential (primary) hypertension: Secondary | ICD-10-CM

## 2019-09-14 DIAGNOSIS — M5442 Lumbago with sciatica, left side: Secondary | ICD-10-CM | POA: Diagnosis not present

## 2019-09-14 DIAGNOSIS — K5901 Slow transit constipation: Secondary | ICD-10-CM

## 2019-09-14 DIAGNOSIS — M5441 Lumbago with sciatica, right side: Secondary | ICD-10-CM | POA: Diagnosis not present

## 2019-09-14 DIAGNOSIS — J189 Pneumonia, unspecified organism: Secondary | ICD-10-CM | POA: Diagnosis not present

## 2019-09-14 DIAGNOSIS — D510 Vitamin B12 deficiency anemia due to intrinsic factor deficiency: Secondary | ICD-10-CM | POA: Diagnosis not present

## 2019-09-14 DIAGNOSIS — D649 Anemia, unspecified: Secondary | ICD-10-CM

## 2019-09-14 NOTE — Progress Notes (Signed)
COMMUNITY PALLIATIVE CARE SW NOTE  PATIENT NAME: Ann Jensen DOB: [birth year] MRN: [id number]  PRIMARY CARE PROVIDER: Mast, Man X, NP  RESPONSIBLE PARTY:  Acct ID - Guarantor Home Phone Work Phone Relationship Acct Type  [id number] Shirlee [id number]  Self P/F     [place], [place], [place] [address]   Due to the COVID-19 crisis, this virtual check-in visit was done via telephone from my office and it was initiated and consent by this patientand orfamily.  PLAN OF CARE and INTERVENTIONS:             1. GOALS OF CARE/ ADVANCE CARE PLANNING:  Goal is for patient to have the best quality of life possible.  Patient has a MOST form that indicates DNR. 2. SOCIAL/EMOTIONAL/SPIRITUAL ASSESSMENT/ INTERVENTIONS:  SW conducted a virtual check-in visit with patient's son, Ann Jensen.  Patient previously resided at an independent apartment at [facility], but is now in the SNF.  She was admitted 12/3.  Pain has been an issue which is being addressed.  Patient is nonverbal and is not getting out of bed.  Her son reports she is more confused.  Both of patient's sons visit regularly.  SW provided active listening and supportive counseling. 3. PATIENT/CAREGIVER EDUCATION/ COPING:  Provided education regarding Palliative Care SW role.  Son copes by problem-solving. 4. PERSONAL EMERGENCY PLAN:  Per facility protocol. 5. COMMUNITY RESOURCES COORDINATION/ HEALTH CARE NAVIGATION:  None. 6. FINANCIAL/LEGAL CONCERNS/INTERVENTIONS:  None.     SOCIAL HX:  Social History   Tobacco Use  . Smoking status: Former Smoker    Types: E-cigarettes  . Smokeless tobacco: Never Used  . Tobacco comment: when she was 85 years old  Substance Use Topics  . Alcohol use: No    CODE STATUS:  DNR  ADVANCED DIRECTIVES: HCPOA MOST FORM COMPLETE:  Yes HOSPICE EDUCATION PROVIDED: No Duration of visit and documentation:  30 minutes.      Creola Corn Kemon Devincenzi, LCSW

## 2019-09-14 NOTE — Progress Notes (Signed)
Location: [place] Room Number: 16 Place of Service:  SNF [phone])  Provider: Veleta Miners MD  Code Status: DNR Goals of Care:  Advanced Directives 09/07/2019  Does Patient Have a Medical Advance Directive? Yes  Type of Advance Directive Out of facility DNR (pink MOST or yellow form)  Does patient want to make changes to medical advance directive? No - Patient declined  Copy of [place] in Chart? -  Would patient like information on creating a medical advance directive? -  Pre-existing out of facility DNR order (yellow form or pink MOST form) Pink MOST form placed in chart (order not valid for inpatient use)     Chief Complaint  Patient presents with  . Acute Visit    Pneumonia follow up    HPI: Patient is a 85 y.o. female seen today for an acute visit for Follow up of her Pneumonia and Fall  Patient has h/o Hypertension, Anxiety, Restless leg Syndrome, Insomnia, Arthritis and Posterior Tendon Dysfunction with right foot Valgus in Right Knee with inability toAmbulate And a history of right lower extremity hematoma needing wound care. It is healed now  Patient lives by herself in [address] apartment in [facility]. She is usually independent in her Transfers and ADLS.  Though she does not ambulate and is wheelchair dependent She fell at home. Also had Back pain. With inability to do her transfers So was transferred to SNF She was also found to be confused with Cough and SOB. Her Xray showed Right Middle Lobe Pneumonia. Her Covid Test was negative Twice She was started on Doxycyline and Prednisone Taper She is now back to her baseline. Cough much improved. Mental status back to normal Acute Right side Pain with Sciatica is better also Continue to have some pain in her Right Knee which is swollen and has Big bruise  She did not have any Acute complains today. No new Nursing Issues    Past Medical History:  Diagnosis Date  . Anemia    . Hypertension   . OA (osteoarthritis)   . Osteoporosis   . Renal cyst     Past Surgical History:  Procedure Laterality Date  . APPENDECTOMY    . BLADDER SUSPENSION    . CATARACT EXTRACTION    . CESAREAN SECTION    . REPLACEMENT TOTAL KNEE  1995  . TONSILLECTOMY      No Known Allergies  Outpatient Encounter Medications as of 09/14/2019  Medication Sig  . acetaminophen (TYLENOL) 500 MG tablet Take 1,000 mg by mouth every 8 (eight) hours as needed.  . ALPRAZolam (XANAX) 0.25 MG tablet TAKE 1 TABLET AT BEDTIME AS NEEDED FOR ANXIETY OR SLEEP  . amLODipine (NORVASC) 5 MG tablet Take 1 tablet (5 mg total) by mouth daily.  Marland Kitchen aspirin EC 81 MG tablet Take 81 mg by mouth. Once A Morning on Mon, Thu  . Biotin 5000 MCG TABS Take 5,000 mcg by mouth daily.   . Cholecalciferol (VITAMIN D) 50 MCG (2000 UT) CAPS Take 2,000 Units by mouth daily.   Marland Kitchen doxycycline (VIBRA-TABS) 100 MG tablet Take 100 mg by mouth 2 (two) times daily.  Marland Kitchen gabapentin (NEURONTIN) 100 MG capsule Take 1 capsule (100 mg total) by mouth 3 (three) times daily.  Marland Kitchen HYDROcodone-acetaminophen (NORCO/VICODIN) 5-325 MG tablet Take 1 tablet by mouth every 6 (six) hours as needed for severe pain.  . metoprolol tartrate (LOPRESSOR) 50 MG tablet TAKE 1 TABLET BY MOUTH TWICE DAILY.  Marland Kitchen  mirtazapine (REMERON) 15 MG tablet Take 7.5 mg by mouth at bedtime.  . multivitamin-lutein (OCUVITE-LUTEIN) CAPS capsule Take 1 capsule by mouth 2 (two) times daily.   . predniSONE (DELTASONE) 10 MG tablet Take 10 mg by mouth daily with breakfast.  . [START ON 09/16/2019] PREDNISONE PO Take 5 mg by mouth. x 2 days Once A Morning  . rOPINIRole (REQUIP) 1 MG tablet TAKE 1 TABLET EACH EVENING.  Marland Kitchen saccharomyces boulardii (FLORASTOR) 250 MG capsule Take 250 mg by mouth 2 (two) times daily.  Marland Kitchen tuberculin (TUBERSOL) 5 UNIT/0.1ML injection Inject into the skin once. Once A Day Every 14 Days  . zinc oxide 20 % ointment Apply 1 application topically as needed for  irritation.  . [DISCONTINUED] albuterol (VENTOLIN HFA) 108 (90 Base) MCG/ACT inhaler Inhale 2 puffs into the lungs every 6 (six) hours as needed for wheezing or shortness of breath.  . [DISCONTINUED] sennosides-docusate sodium (SENOKOT-S) 8.6-50 MG tablet Take 2 tablets by mouth daily.   No facility-administered encounter medications on file as of 09/14/2019.    Review of Systems:  Review of Systems  Constitutional: Negative.   HENT: Negative.   Respiratory: Positive for cough.   Cardiovascular: Positive for leg swelling.  Gastrointestinal: Negative.   Genitourinary: Negative.   Musculoskeletal: Positive for back pain.  Skin: Negative.   Neurological: Positive for weakness.  Psychiatric/Behavioral: Negative.   All other systems reviewed and are negative.   Health Maintenance  Topic Date Due  . DEXA SCAN  07/22/2020 (Originally 05/16/1989)  . TETANUS/TDAP  11/12/2028  . INFLUENZA VACCINE  Completed  . PNA vac Low Risk Adult  Completed    Physical Exam: Vitals:   09/14/19 1513  BP: 138/78  Pulse: 68  Resp: 20  Temp: 98.1 F (36.7 C)  SpO2: 95%  Weight: 136 lb 3.2 oz (61.8 kg)  Height: 5\' 1"  (1.549 m)   Body mass index is 25.73 kg/m. Physical Exam Vitals reviewed.  Constitutional:      Appearance: Normal appearance.  HENT:     Head: Normocephalic.     Nose: Nose normal.     Mouth/Throat:     Mouth: Mucous membranes are moist.     Pharynx: Oropharynx is clear.  Eyes:     Pupils: Pupils are equal, round, and reactive to light.  Cardiovascular:     Rate and Rhythm: Normal rate and regular rhythm.     Pulses: Normal pulses.  Pulmonary:     Effort: Pulmonary effort is normal.     Comments: Has few Rales in Right Lower lung Base Abdominal:     General: Abdomen is flat. Bowel sounds are normal.     Palpations: Abdomen is soft.  Musculoskeletal:        General: Swelling present.     Cervical back: Neck supple.     Comments: Mild swelling Bilateral Right more  then Left. Also has bruise and swelling in her right Knee with Mild tenderness   Skin:    General: Skin is warm.  Neurological:     General: No focal deficit present.     Mental Status: She is alert.     Comments:  Patient has mild weakness in her Right LE. Left was 4/5 strength  Psychiatric:        Mood and Affect: Mood normal.        Thought Content: Thought content normal.     Labs reviewed: Basic Metabolic Panel: Recent Labs    01/01/19 0800 03/05/19 0810 07/23/19  0000  NA 139 140 143  K 3.6 3.4* 3.9  CL 99 99 102  CO2 31 31 28   GLUCOSE 89 84 78  BUN 27* 30* 29*  CREATININE 1.19* 1.14* 1.12*  CALCIUM 9.2 9.4 9.4  TSH 1.56  --  1.54   Liver Function Tests: Recent Labs    01/01/19 0800 03/05/19 0810 07/23/19 0000  AST 16 17 17   ALT 9 8 9   BILITOT 0.5 0.6 0.7  PROT 6.5 6.6 6.8   No results for input(s): LIPASE, AMYLASE in the last 8760 hours. No results for input(s): AMMONIA in the last 8760 hours. CBC: Recent Labs    01/01/19 0800 03/05/19 0810 07/23/19 0000  WBC 6.1 6.3 5.6  NEUTROABS 3,093 3,213 2,699  HGB 12.8 13.0 12.8  HCT 37.8 38.9 38.4  MCV 100.5* 100.3* 101.9*  PLT 211 195 176   Lipid Panel: Recent Labs    07/23/19 0000  CHOL 175  HDL 54  LDLCALC 105*  TRIG 74  CHOLHDL 3.2   No results found for: HGBA1C  Procedures since last visit: No results found.  Assessment/Plan  Pneumonia of right middle lobe  Afebrile Finished Doxycyline Will try to taper her Oxygen Covid Twice Negative  Essential hypertension Stable on Norvasc and Lopressor  Acute right-sided low back pain with bilateral sciatica S/P ? Compression Fractura Acute /Chronic On Neurontin and Norco PRN Pain Better Started Therapy Anemia, unspecified type Repeat CBC pending Also Checking B12  Right Knee swelling  Xrays were negative for any acute changes Will continue Norco PRN Aspirin was discontinued Slow transit constipation C/o Loose Stool Will change Senna  to Colasce Unsteady Gait Will need Therapy eval once more stable and out of Quarantine Depression and Anxiety Will continue Remeron and Xanax PRN Restless Leg On Requip ACP Per Dr Sabra Heck her son Palliative care consult   Labs/tests ordered:  * No order type specified * Next appt:  10/07/2019

## 2019-09-15 DIAGNOSIS — Z20828 Contact with and (suspected) exposure to other viral communicable diseases: Secondary | ICD-10-CM | POA: Diagnosis not present

## 2019-09-18 ENCOUNTER — Other Ambulatory Visit: Payer: Self-pay

## 2019-09-18 MED ORDER — HYDROCODONE-ACETAMINOPHEN 5-325 MG PO TABS: 1.0000 | ORAL_TABLET | Freq: Four times a day (QID) | ORAL | 0 refills | Status: DC | PRN

## 2019-09-19 DIAGNOSIS — D649 Anemia, unspecified: Secondary | ICD-10-CM | POA: Diagnosis not present

## 2019-09-19 DIAGNOSIS — I10 Essential (primary) hypertension: Secondary | ICD-10-CM | POA: Diagnosis not present

## 2019-09-19 DIAGNOSIS — M76819 Anterior tibial syndrome, unspecified leg: Secondary | ICD-10-CM | POA: Diagnosis not present

## 2019-09-19 DIAGNOSIS — M159 Polyosteoarthritis, unspecified: Secondary | ICD-10-CM | POA: Diagnosis not present

## 2019-09-19 DIAGNOSIS — R293 Abnormal posture: Secondary | ICD-10-CM | POA: Diagnosis not present

## 2019-09-19 DIAGNOSIS — R2681 Unsteadiness on feet: Secondary | ICD-10-CM | POA: Diagnosis not present

## 2019-09-19 DIAGNOSIS — Z20828 Contact with and (suspected) exposure to other viral communicable diseases: Secondary | ICD-10-CM | POA: Diagnosis not present

## 2019-09-19 DIAGNOSIS — F5105 Insomnia due to other mental disorder: Secondary | ICD-10-CM | POA: Diagnosis not present

## 2019-09-19 DIAGNOSIS — N1832 Chronic kidney disease, stage 3b: Secondary | ICD-10-CM | POA: Diagnosis not present

## 2019-09-19 DIAGNOSIS — Z9181 History of falling: Secondary | ICD-10-CM | POA: Diagnosis not present

## 2019-09-19 DIAGNOSIS — F418 Other specified anxiety disorders: Secondary | ICD-10-CM | POA: Diagnosis not present

## 2019-09-19 DIAGNOSIS — F419 Anxiety disorder, unspecified: Secondary | ICD-10-CM | POA: Diagnosis not present

## 2019-09-19 DIAGNOSIS — G2581 Restless legs syndrome: Secondary | ICD-10-CM | POA: Diagnosis not present

## 2019-09-19 DIAGNOSIS — I129 Hypertensive chronic kidney disease with stage 1 through stage 4 chronic kidney disease, or unspecified chronic kidney disease: Secondary | ICD-10-CM | POA: Diagnosis not present

## 2019-09-19 DIAGNOSIS — N183 Chronic kidney disease, stage 3 unspecified: Secondary | ICD-10-CM | POA: Diagnosis not present

## 2019-09-19 DIAGNOSIS — M6281 Muscle weakness (generalized): Secondary | ICD-10-CM | POA: Diagnosis not present

## 2019-09-21 ENCOUNTER — Other Ambulatory Visit: Payer: Self-pay | Admitting: Nurse Practitioner

## 2019-09-21 MED ORDER — FUROSEMIDE 20 MG PO TABS: 20.0000 mg | ORAL_TABLET | ORAL | 1 refills | Status: DC

## 2019-09-21 NOTE — Telephone Encounter (Signed)
Patient is requesting refill for mirtazapine 15, routing to provider for approval due to high allergy/contraindication warning.

## 2019-09-23 ENCOUNTER — Other Ambulatory Visit: Payer: Self-pay | Admitting: Internal Medicine

## 2019-09-29 ENCOUNTER — Encounter: Payer: Self-pay | Admitting: Nurse Practitioner

## 2019-09-29 ENCOUNTER — Non-Acute Institutional Stay (SKILLED_NURSING_FACILITY): Payer: PPO | Admitting: Nurse Practitioner

## 2019-09-29 DIAGNOSIS — M159 Polyosteoarthritis, unspecified: Secondary | ICD-10-CM

## 2019-09-29 DIAGNOSIS — I10 Essential (primary) hypertension: Secondary | ICD-10-CM

## 2019-09-29 DIAGNOSIS — F418 Other specified anxiety disorders: Secondary | ICD-10-CM

## 2019-09-29 DIAGNOSIS — F5105 Insomnia due to other mental disorder: Secondary | ICD-10-CM | POA: Diagnosis not present

## 2019-09-29 NOTE — Progress Notes (Signed)
Location:   [place] Room Number: 16 Place of Service:  SNF (31) Provider: Kaleisha Bhargava X, NP  Estreya Clay X, NP  Patient Care Team: Olajuwon Fosdick X, NP as PCP - General (Internal Medicine) Wardell Honour, MD as Attending Physician (Family Medicine)  Extended Emergency Contact Information Primary Emergency Contact: [facility] Address: [address] [address] [place] Phone: [phone] Mobile Phone: [phone] Relation: Son Secondary Emergency Contact: [facility] Address: [address]          [place], [place] [address] [place] Phone: [phone] Relation: Son  Code Status:  DNR Goals of care: Advanced Directive information Advanced Directives 09/07/2019  Does Patient Have a Medical Advance Directive? Yes  Type of Advance Directive Out of facility DNR (pink MOST or yellow form)  Does patient want to make changes to medical advance directive? No - Patient declined  Copy of [place] in Chart? -  Would patient like information on creating a medical advance directive? -  Pre-existing out of facility DNR order (yellow form or pink MOST form) Pink MOST form placed in chart (order not valid for inpatient use)     Chief Complaint  Patient presents with   Acute Visit    Medication adjustment and depression    HPI:  Pt is a 85 y.o. female seen today for an acute visit for the patient c/o nightmare associated with Gabapentin use, refused occasionally in the past, her back pain is reasonably managed with Tylenol, Norco, Gabapentin. Also the patient would like to try 1/2 Norco as needed for pain. The patient admitted her depressive mood even if her appetite and sleep is at her baseline while on Mirtazapine 7.5mg  qd, she did admit early am around 3am awake, difficulty returning to sleep, no new.  Hx of anxiety, on Alprazolam 0.25mg  hs prn. HTN, blood pressure is controlled on  Metoprolol 50mg  bid, Amlodipine 5mg  qd.    Past Medical History:  Diagnosis Date   Anemia    Hypertension    OA (osteoarthritis)    Osteoporosis    Renal cyst    Past Surgical History:  Procedure Laterality Date   APPENDECTOMY     BLADDER SUSPENSION     CATARACT EXTRACTION     CESAREAN SECTION     REPLACEMENT TOTAL KNEE  1995   TONSILLECTOMY      No Known Allergies  Allergies as of 09/29/2019   No Known Allergies     Medication List       Accurate as of September 29, 2019  3:27 PM. If you have any questions, ask your nurse or doctor.        STOP taking these medications   furosemide 20 MG tablet Commonly known as: LASIX Stopped by: Izabellah Dadisman X Declyn Delsol, NP     TAKE these medications   acetaminophen 500 MG tablet Commonly known as: TYLENOL Take 1,000 mg by mouth every 8 (eight) hours as needed.   ALPRAZolam 0.25 MG tablet Commonly known as: XANAX TAKE 1 TABLET AT BEDTIME AS NEEDED FOR ANXIETY OR SLEEP   amLODipine 5 MG tablet Commonly known as: NORVASC Take 1 tablet (5 mg total) by mouth daily.   aspirin EC 81 MG tablet Take 81 mg by mouth. Once A Morning on Mon, Thu   Biotin 5000 MCG Tabs Take 5,000 mcg by mouth daily.   docusate sodium 100 MG  capsule Commonly known as: COLACE Take 100 mg by mouth daily.   escitalopram 5 MG tablet Commonly known as: LEXAPRO Take 5 mg by mouth every morning.   gabapentin 100 MG capsule Commonly known as: NEURONTIN Take 100 mg by mouth 2 (two) times daily. What changed: Another medication with the same name was removed. Continue taking this medication, and follow the directions you see here. Changed by: Margeret Stachnik X Rhodes Calvert, NP   HYDROcodone-acetaminophen 5-325 MG tablet Commonly known as: NORCO/VICODIN Take 1 tablet by mouth every 6 (six) hours as needed for severe pain.   metoprolol tartrate 50 MG tablet Commonly known as: LOPRESSOR TAKE 1 TABLET BY MOUTH TWICE DAILY.   mirtazapine 7.5 MG tablet Commonly known  as: REMERON TAKE ONE TABLET AT BEDTIME. What changed: Another medication with the same name was removed. Continue taking this medication, and follow the directions you see here. Changed by: Carsten Carstarphen X Burlin Mcnair, NP   multivitamin-lutein Caps capsule Take 1 capsule by mouth 2 (two) times daily.   rOPINIRole 1 MG tablet Commonly known as: REQUIP TAKE 1 TABLET EACH EVENING.   Vitamin D 50 MCG (2000 UT) Caps Take 2,000 Units by mouth daily.   zinc oxide 20 % ointment Apply 1 application topically as needed for irritation.       Review of Systems  Constitutional: Negative for activity change, appetite change, chills, diaphoresis, fatigue and fever.  HENT: Positive for hearing loss. Negative for congestion and voice change.   Respiratory: Positive for cough and shortness of breath. Negative for wheezing.        DOE  Cardiovascular: Positive for leg swelling. Negative for chest pain and palpitations.  Gastrointestinal: Negative for abdominal distention, abdominal pain, constipation, diarrhea, nausea and vomiting.  Genitourinary: Negative for difficulty urinating, dysuria and urgency.  Musculoskeletal: Positive for arthralgias, back pain and gait problem.  Skin: Negative for color change and pallor.  Neurological: Negative for dizziness, speech difficulty, weakness and headaches.  Psychiatric/Behavioral: Positive for sleep disturbance. Negative for agitation, behavioral problems and hallucinations. The patient is not nervous/anxious.        Nightmare associated with Gabapentin. Early arm awake around 3am, difficulty return to sleep.     Immunization History  Administered Date(s) Administered   Influenza, High Dose Seasonal PF 07/10/2017, 07/15/2019   Influenza,inj,Quad PF,6+ Mos 07/03/2018   Influenza-Unspecified 07/10/2017   Pneumococcal Conjugate-13 08/18/2014   Pneumococcal Polysaccharide-23 07/26/2010   Tdap 11/12/2018   Pertinent  Health Maintenance Due  Topic Date Due   DEXA  SCAN  07/22/2020 (Originally 05/16/1989)   INFLUENZA VACCINE  Completed   PNA vac Low Risk Adult  Completed   Fall Risk  08/12/2019 07/23/2019 07/15/2019 04/15/2019 09/02/2018  Falls in the past year? 0 0 0 0 0  Number falls in past yr: 0 0 0 0 0  Injury with Fall? - 0 - 0 0   Functional Status Survey:    Vitals:   09/29/19 1436  BP: 122/80  Pulse: 68  Resp: 20  Temp: 97.8 F (36.6 C)  SpO2: 96%  Weight: 133 lb 3.2 oz (60.4 kg)  Height: 5\' 1"  (1.549 m)   Body mass index is 25.17 kg/m. Physical Exam Vitals and nursing note reviewed.  Constitutional:      General: She is not in acute distress.    Appearance: Normal appearance. She is not ill-appearing, toxic-appearing or diaphoretic.  HENT:     Head: Normocephalic and atraumatic.     Nose: Nose normal.  Mouth/Throat:     Mouth: Mucous membranes are moist.  Eyes:     Extraocular Movements: Extraocular movements intact.     Conjunctiva/sclera: Conjunctivae normal.     Pupils: Pupils are equal, round, and reactive to light.  Cardiovascular:     Rate and Rhythm: Normal rate and regular rhythm.     Heart sounds: Murmur present.  Pulmonary:     Breath sounds: No wheezing, rhonchi or rales.  Abdominal:     General: Bowel sounds are normal. There is no distension.     Palpations: Abdomen is soft.     Tenderness: There is no abdominal tenderness. There is no right CVA tenderness, left CVA tenderness, guarding or rebound.  Musculoskeletal:     Cervical back: Normal range of motion and neck supple.     Right lower leg: Edema present.     Left lower leg: Edema present.     Comments: 1+ edema BLE. Right ankle turned laterally.   Skin:    General: Skin is warm and dry.  Neurological:     General: No focal deficit present.     Mental Status: She is alert and oriented to person, place, and time. Mental status is at baseline.     Motor: No weakness.     Coordination: Coordination normal.     Gait: Gait abnormal.    Psychiatric:        Mood and Affect: Mood normal.        Behavior: Behavior normal.        Thought Content: Thought content normal.        Judgment: Judgment normal.     Comments: Flat affect, but conversed well.      Labs reviewed: Recent Labs    01/01/19 0800 03/05/19 0810 07/23/19 0000  NA 139 140 143  K 3.6 3.4* 3.9  CL 99 99 102  CO2 31 31 28   GLUCOSE 89 84 78  BUN 27* 30* 29*  CREATININE 1.19* 1.14* 1.12*  CALCIUM 9.2 9.4 9.4   Recent Labs    01/01/19 0800 03/05/19 0810 07/23/19 0000  AST 16 17 17   ALT 9 8 9   BILITOT 0.5 0.6 0.7  PROT 6.5 6.6 6.8   Recent Labs    01/01/19 0800 03/05/19 0810 07/23/19 0000  WBC 6.1 6.3 5.6  NEUTROABS 3,093 3,213 2,699  HGB 12.8 13.0 12.8  HCT 37.8 38.9 38.4  MCV 100.5* 100.3* 101.9*  PLT 211 195 176   Lab Results  Component Value Date   TSH 1.54 07/23/2019   No results found for: HGBA1C Lab Results  Component Value Date   CHOL 175 07/23/2019   HDL 54 07/23/2019   LDLCALC 105 (H) 07/23/2019   TRIG 74 07/23/2019   CHOLHDL 3.2 07/23/2019    Significant Diagnostic Results in last 30 days:  No results found.  Assessment/Plan Insomnia secondary to depression with anxiety Early am around 3am awake, difficulty returning asleep, continue Mirtazapine 7.5mg  qd, Alprazolam 0.25mg  qhs, adding Lexapro 5mg  qd. Observe.   Essential hypertension Blood pressure is controlled, continue Metoprolol 50mg  bid, Amlodipine 5mg  qd.   Osteoarthritis of multiple joints Lower back pain, partially neuropathic in nature, knee pain, better controlled pain, desires to decrease Gabapentin 100mg  bid to eliminate possible side effect of nightmare, continue prn Tylenol, will reduce Norco to 1/2 tab prn. Observe.     Family/ staff Communication: plan of care reviewed with the patient and charge nurse.   Labs/tests ordered:  none  Time spend  25 minutes.

## 2019-09-29 NOTE — Assessment & Plan Note (Signed)
Blood pressure is controlled, continue Metoprolol 50mg  bid, Amlodipine 5mg  qd.

## 2019-09-29 NOTE — Assessment & Plan Note (Signed)
Lower back pain, partially neuropathic in nature, knee pain, better controlled pain, desires to decrease Gabapentin 100mg  bid to eliminate possible side effect of nightmare, continue prn Tylenol, will reduce Norco to 1/2 tab prn. Observe.

## 2019-09-29 NOTE — Assessment & Plan Note (Signed)
Early am around 3am awake, difficulty returning asleep, continue Mirtazapine 7.5mg  qd, Alprazolam 0.25mg  qhs, adding Lexapro 5mg  qd. Observe.

## 2019-10-05 ENCOUNTER — Encounter: Payer: Self-pay | Admitting: Internal Medicine

## 2019-10-05 ENCOUNTER — Non-Acute Institutional Stay (SKILLED_NURSING_FACILITY): Payer: PPO | Admitting: Internal Medicine

## 2019-10-05 ENCOUNTER — Non-Acute Institutional Stay: Payer: PPO | Admitting: *Deleted

## 2019-10-05 DIAGNOSIS — F5105 Insomnia due to other mental disorder: Secondary | ICD-10-CM

## 2019-10-05 DIAGNOSIS — F419 Anxiety disorder, unspecified: Secondary | ICD-10-CM

## 2019-10-05 DIAGNOSIS — N1832 Chronic kidney disease, stage 3b: Secondary | ICD-10-CM | POA: Diagnosis not present

## 2019-10-05 DIAGNOSIS — I10 Essential (primary) hypertension: Secondary | ICD-10-CM | POA: Diagnosis not present

## 2019-10-05 DIAGNOSIS — F418 Other specified anxiety disorders: Secondary | ICD-10-CM | POA: Diagnosis not present

## 2019-10-05 DIAGNOSIS — Z515 Encounter for palliative care: Secondary | ICD-10-CM

## 2019-10-05 NOTE — Progress Notes (Signed)
Location:   [facility] [place] Room Number: 1 Place of Service:  SNF [phone]) Provider:  Veleta Miners MD  Mast, Man X, NP  Patient Care Team: Mast, Man X, NP as PCP - General (Internal Medicine) Wardell Honour, MD as Attending Physician (Family Medicine)  Extended Emergency Contact Information Primary Emergency Contact: [facility] Address: [place] [address] [place] Phone: [phone] Mobile Phone: [phone] Relation: Son Secondary Emergency Contact: [facility] Address: [address]          [place], [place] [address] [place] Phone: [phone] Relation: Son  Code Status:  DNR Goals of care: Advanced Directive information Advanced Directives 09/07/2019  Does Patient Have a Medical Advance Directive? Yes  Type of Advance Directive Out of facility DNR (pink MOST or yellow form)  Does patient want to make changes to medical advance directive? No - Patient declined  Copy of [place] in Chart? -  Would patient like information on creating a medical advance directive? -  Pre-existing out of facility DNR order (yellow form or pink MOST form) Pink MOST form placed in chart (order not valid for inpatient use)     Chief Complaint  Patient presents with  . Acute Visit    Follow up of anxiety    HPI:  Pt is a 86 y.o. female seen today for an acute visit for Anxiety and Depression  Patient has h/o Hypertension, Anxiety, Restless leg Syndrome, Insomnia, Arthritis and Posterior Tendon Dysfunction with right foot Valgus in Right Knee with inability toAmbulate And a history of right lower extremity hematoma needing wound care. It is healed now She was admitted to SNF from [place] apartment when she fell. She was found to have Pneumonia and Bronchitis Treated with Prednisone and Antibiotics She has improved since then  Her Back pain is better also But now she knows  that it would be hard for her to go back to her apartment  She says she is depressed and feels down due to this. Not eating well. Has lost 3-4 pounds Also Having Nightmares which seems like new issue No SOB cough or fever. Denies Back pain      Past Medical History:  Diagnosis Date  . Anemia   . Hypertension   . OA (osteoarthritis)   . Osteoporosis   . Renal cyst    Past Surgical History:  Procedure Laterality Date  . APPENDECTOMY    . BLADDER SUSPENSION    . CATARACT EXTRACTION    . CESAREAN SECTION    . REPLACEMENT TOTAL KNEE  1995  . TONSILLECTOMY      No Known Allergies  Allergies as of 10/05/2019   No Known Allergies     Medication List       Accurate as of October 05, 2019  2:14 PM. If you have any questions, ask your nurse or doctor.        acetaminophen 500 MG tablet Commonly known as: TYLENOL Take 1,000 mg by mouth every 8 (eight) hours as needed.   ALPRAZolam 0.25 MG tablet Commonly known as: XANAX TAKE 1 TABLET AT BEDTIME AS NEEDED FOR ANXIETY OR SLEEP   amLODipine 5 MG tablet Commonly known as: NORVASC Take 1 tablet (5 mg total) by mouth daily.   aspirin EC 81 MG tablet Take 81 mg by mouth. Once A Morning on Mon, Thu   Biotin 5000 MCG Tabs  Take 5,000 mcg by mouth daily.   docusate sodium 100 MG capsule Commonly known as: COLACE Take 100 mg by mouth daily.   escitalopram 5 MG tablet Commonly known as: LEXAPRO Take 5 mg by mouth every morning.   gabapentin 100 MG capsule Commonly known as: NEURONTIN Take 100 mg by mouth 2 (two) times daily.   HYDROcodone-acetaminophen 5-325 MG tablet Commonly known as: NORCO/VICODIN Take 1 tablet by mouth every 6 (six) hours as needed for severe pain.   lactose free nutrition Liqd Take 237 mLs by mouth 2 (two) times daily between meals.   metoprolol tartrate 50 MG tablet Commonly known as: LOPRESSOR TAKE 1 TABLET BY MOUTH TWICE DAILY.   mirtazapine 7.5 MG tablet Commonly known as: REMERON TAKE  ONE TABLET AT BEDTIME.   multivitamin-lutein Caps capsule Take 1 capsule by mouth 2 (two) times daily.   rOPINIRole 1 MG tablet Commonly known as: REQUIP TAKE 1 TABLET EACH EVENING.   Vitamin D 50 MCG (2000 UT) Caps Take 2,000 Units by mouth daily.   zinc oxide 20 % ointment Apply 1 application topically as needed for irritation.       Review of Systems  Constitutional: Positive for activity change and appetite change.  HENT: Negative.   Respiratory: Negative.   Cardiovascular: Negative.   Gastrointestinal: Positive for constipation.  Genitourinary: Negative.   Musculoskeletal: Negative.   Skin: Negative.   Neurological: Positive for weakness.  Psychiatric/Behavioral: Positive for dysphoric mood and sleep disturbance.    Immunization History  Administered Date(s) Administered  . Influenza, High Dose Seasonal PF 07/10/2017, 07/15/2019  . Influenza,inj,Quad PF,6+ Mos 07/03/2018  . Influenza-Unspecified 07/10/2017  . Pneumococcal Conjugate-13 08/18/2014  . Pneumococcal Polysaccharide-23 07/26/2010  . Tdap 11/12/2018   Pertinent  Health Maintenance Due  Topic Date Due  . DEXA SCAN  07/22/2020 (Originally 05/16/1989)  . INFLUENZA VACCINE  Completed  . PNA vac Low Risk Adult  Completed   Fall Risk  08/12/2019 07/23/2019 07/15/2019 04/15/2019 09/02/2018  Falls in the past year? 0 0 0 0 0  Number falls in past yr: 0 0 0 0 0  Injury with Fall? - 0 - 0 0   Functional Status Survey:    Vitals:   10/05/19 1409  BP: 99/60  Pulse: 64  Resp: (!) 28  Temp: 97.8 F (36.6 C)  SpO2: 95%  Weight: 133 lb 3.2 oz (60.4 kg)  Height: 5\' 1"  (1.549 m)   Body mass index is 25.17 kg/m. Physical Exam Vitals reviewed.  Constitutional:      Appearance: Normal appearance.  HENT:     Head: Normocephalic.     Nose: Nose normal.     Mouth/Throat:     Mouth: Mucous membranes are moist.     Pharynx: Oropharynx is clear.  Eyes:     Pupils: Pupils are equal, round, and reactive to  light.  Cardiovascular:     Rate and Rhythm: Normal rate and regular rhythm.     Pulses: Normal pulses.  Pulmonary:     Effort: Pulmonary effort is normal. No respiratory distress.     Breath sounds: Normal breath sounds. No wheezing or rales.  Abdominal:     General: Abdomen is flat. Bowel sounds are normal.     Palpations: Abdomen is soft.  Musculoskeletal:     Cervical back: Neck supple.     Comments:  Mild swelling Bilateral Right more then Left  Skin:    General: Skin is warm.  Neurological:  Mental Status: She is alert and oriented to person, place, and time.     Comments:   Patient has mild weakness in her Right LE. Left was 4/5  Psychiatric:        Mood and Affect: Mood normal.        Thought Content: Thought content normal.     Labs reviewed: Recent Labs    01/01/19 0800 03/05/19 0810 07/23/19 0000  NA 139 140 143  K 3.6 3.4* 3.9  CL 99 99 102  CO2 31 31 28   GLUCOSE 89 84 78  BUN 27* 30* 29*  CREATININE 1.19* 1.14* 1.12*  CALCIUM 9.2 9.4 9.4   Recent Labs    01/01/19 0800 03/05/19 0810 07/23/19 0000  AST 16 17 17   ALT 9 8 9   BILITOT 0.5 0.6 0.7  PROT 6.5 6.6 6.8   Recent Labs    01/01/19 0800 03/05/19 0810 07/23/19 0000  WBC 6.1 6.3 5.6  NEUTROABS 3,093 3,213 2,699  HGB 12.8 13.0 12.8  HCT 37.8 38.9 38.4  MCV 100.5* 100.3* 101.9*  PLT 211 195 176   Lab Results  Component Value Date   TSH 1.54 07/23/2019   No results found for: HGBA1C Lab Results  Component Value Date   CHOL 175 07/23/2019   HDL 54 07/23/2019   LDLCALC 105 (H) 07/23/2019   TRIG 74 07/23/2019   CHOLHDL 3.2 07/23/2019    Significant Diagnostic Results in last 30 days:  No results found.  Assessment/Plan Essential hypertension BP running on Low side Will decrase her Norvasc to 2.5 mg She is also on Lopressor  Depression with anxiety Acute Most likely situational due to her move to SNF D/w Dr Sabra Heck her son Will continue Xanax for now it is helping her On  Remeron and just started on Lexapro Will change the dose in 2 weeks if continue with depression  Stage 3b chronic kidney disease Repeat CMP  Insomnia secondary to Nightmares Possible due to Neuontin Will discontiue it for now Consider if Back pain comes back  Acute right-sided low back pain with bilateral sciatica S/P ? Compression Fractura Acute /Chronic Pain Much Improved On Norco Prn and Tylenol Neurontin Discontinued due to Nightmares Anemia, unspecified type Hgb Stable B12 was normal Right Knee swelling Xrays were negative for any acute changes  Restless Leg On Requip ACP Per Dr Sabra Heck her son Palliative care consult     Family/ staff Communication:   Labs/tests ordered:  Bmp and CBC  Total time spent in this patient care encounter was  25_  minutes; greater than 50% of the visit spent counseling patient and staff, reviewing records , Labs and coordinating care for problems addressed at this encounter.

## 2019-10-06 ENCOUNTER — Other Ambulatory Visit: Payer: Self-pay

## 2019-10-06 NOTE — Progress Notes (Signed)
COMMUNITY PALLIATIVE CARE RN NOTE  PATIENT NAME: Ann Jensen DOB: 10/03/1921 MRN: [id number]  PRIMARY CARE PROVIDER: Mast, Man X, NP  RESPONSIBLE PARTY: Alain Honey (son) Acct ID - Guarantor Home Phone Work Phone Relationship Acct Type  [id number] ORENA, [id number] [phone]  Self P/F     [address], [place], [place] [address]   Covid-19 Pre-screening Negative  PLAN OF CARE and INTERVENTION:  1. ADVANCE CARE PLANNING/GOALS OF CARE: Goal is for patient to get stronger. She has a DNR.  2. PATIENT/CAREGIVER EDUCATION: Explained Palliative Care services 3. DISEASE STATUS: Met with patient in her room at the facility. She currently resides on the SNF unit at [facility] She was originally living in one of their [place] apartments. [place] unable [place] d/t her overall decline and continued need for a higher level of care. She says that she is having a difficult time accepting the fact that she is unable to return and is worried about finding a place for all of her belongings. Her son is highly involved and is taking care of all arrangements. She has been experiencing increased anxiety and more difficulty sleeping. She initially had an order for Xanax 0.25 mg PRN at bedtime, but was changed over this past weekend to twice daily scheduled x 14 days. She was just recently placed on Lexapro to help with her mood. She feels that the scheduled Xanax is helping with her anxiety and overall sleep. She denies pain at this time, but does experience pain in her legs. She has an order for PRN Tylenol for mild pain and Norco for more severe pain. She requires assistance with bathing, dressing and transfers. She is non-ambulatory at this time. She is able to assist with turning and repositioning in bed. She reports increased weakness over the past several days. She is currently working with PT/OT at the facility. She says that she was unable to participate in  therapy today d/t her feelings of weakness. She is on Oxygen at 3L/min via [place] continuously. She uses a Purewick system for urination. Device is changed twice daily. She uses a bedpan for BMs. LBM yesterday 1/3. She says that she has been having more difficulty evacuating stool. She is taking a stool softener daily, but also has a standing order for Milk of Magnesia and Dulcolax suppositories. Her appetite has decreased recently. At one point, she was on Prednisone and seemed to eat well, however now is mainly only eating about 25% or less of meals. She received a new order over the weekend for Boost Plus twice daily for nutritional supplementation. She wears compression stockings daily and they are removed at night. Trace edema noted to bilateral lower extremities. Will continue to monitor.   HISTORY OF PRESENT ILLNESS: This is a 86 yo female who resides at [facility] on the SNF unit. Palliative care has been asked to follow patient for additional support. Will visit patient monthly and PRN.    CODE STATUS: DNR  ADVANCED DIRECTIVES: Y MOST FORM: yes PPS: 30%   (Duration of visit and documentation 60 minutes)   [name], RN BSN

## 2019-10-07 ENCOUNTER — Encounter: Payer: PPO | Admitting: Internal Medicine

## 2019-10-12 DIAGNOSIS — R293 Abnormal posture: Secondary | ICD-10-CM | POA: Diagnosis not present

## 2019-10-12 DIAGNOSIS — I129 Hypertensive chronic kidney disease with stage 1 through stage 4 chronic kidney disease, or unspecified chronic kidney disease: Secondary | ICD-10-CM | POA: Diagnosis not present

## 2019-10-12 DIAGNOSIS — Z9181 History of falling: Secondary | ICD-10-CM | POA: Diagnosis not present

## 2019-10-12 DIAGNOSIS — D510 Vitamin B12 deficiency anemia due to intrinsic factor deficiency: Secondary | ICD-10-CM | POA: Diagnosis not present

## 2019-10-12 DIAGNOSIS — G2581 Restless legs syndrome: Secondary | ICD-10-CM | POA: Diagnosis not present

## 2019-10-12 DIAGNOSIS — R2681 Unsteadiness on feet: Secondary | ICD-10-CM | POA: Diagnosis not present

## 2019-10-12 DIAGNOSIS — M6281 Muscle weakness (generalized): Secondary | ICD-10-CM | POA: Diagnosis not present

## 2019-10-12 LAB — BASIC METABOLIC PANEL
BUN: 14 (ref 4–21)
CO2: 38 — AB (ref 13–22)
Chloride: 95 — AB (ref 99–108)
Creatinine: 0.8 (ref 0.5–1.1)
Glucose: 79
Potassium: 4.3 (ref 3.4–5.3)
Sodium: 139 (ref 137–147)

## 2019-10-12 LAB — CBC AND DIFFERENTIAL
HCT: 34 — AB (ref 36–46)
Hemoglobin: 11.2 — AB (ref 12.0–16.0)
Neutrophils Absolute: 3101
Platelets: 232 (ref 150–399)
WBC: 7

## 2019-10-12 LAB — COMPREHENSIVE METABOLIC PANEL: Calcium: 9.2 (ref 8.7–10.7)

## 2019-10-12 LAB — CBC: RBC: 3.2 — AB (ref 3.87–5.11)

## 2019-10-13 DIAGNOSIS — Z20828 Contact with and (suspected) exposure to other viral communicable diseases: Secondary | ICD-10-CM | POA: Diagnosis not present

## 2019-10-19 ENCOUNTER — Other Ambulatory Visit: Payer: Self-pay | Admitting: Internal Medicine

## 2019-10-19 MED ORDER — ALPRAZOLAM 0.25 MG PO TABS: ORAL_TABLET | ORAL | 0 refills | Status: DC

## 2019-10-20 DIAGNOSIS — J189 Pneumonia, unspecified organism: Secondary | ICD-10-CM | POA: Diagnosis not present

## 2019-10-20 DIAGNOSIS — Z20828 Contact with and (suspected) exposure to other viral communicable diseases: Secondary | ICD-10-CM | POA: Diagnosis not present

## 2019-10-21 ENCOUNTER — Non-Acute Institutional Stay (SKILLED_NURSING_FACILITY): Payer: PPO | Admitting: Internal Medicine

## 2019-10-21 ENCOUNTER — Encounter: Payer: Self-pay | Admitting: Internal Medicine

## 2019-10-21 DIAGNOSIS — F418 Other specified anxiety disorders: Secondary | ICD-10-CM | POA: Diagnosis not present

## 2019-10-21 DIAGNOSIS — I10 Essential (primary) hypertension: Secondary | ICD-10-CM

## 2019-10-21 DIAGNOSIS — D649 Anemia, unspecified: Secondary | ICD-10-CM | POA: Diagnosis not present

## 2019-10-21 DIAGNOSIS — G2581 Restless legs syndrome: Secondary | ICD-10-CM

## 2019-10-21 DIAGNOSIS — R918 Other nonspecific abnormal finding of lung field: Secondary | ICD-10-CM

## 2019-10-21 NOTE — Progress Notes (Signed)
Location:   [place] Room Number: 1 Place of Service:  SNF ([id number]) Provider:  Veleta Miners MD  Mast, Man X, NP  Patient Care Team: Mast, Man X, NP as PCP - General (Internal Medicine) Wardell Honour, MD as Attending Physician (Family Medicine)  Extended Emergency Contact Information Primary Emergency Contact: [facility] Address: [place] [address] [place] Phone: [phone] Mobile Phone: [phone] Relation: Son Secondary Emergency Contact: [facility] Address: [address]          [place], [place] [address] [place] Phone: [phone] Relation: Son  Code Status:  DNR Goals of care: Advanced Directive information Advanced Directives 09/07/2019  Does Patient Have a Medical Advance Directive? Yes  Type of Advance Directive Out of facility DNR (pink MOST or yellow form)  Does patient want to make changes to medical advance directive? No - Patient declined  Copy of [place] in Chart? -  Would patient like information on creating a medical advance directive? -  Pre-existing out of facility DNR order (yellow form or pink MOST form) Pink MOST form placed in chart (order not valid for inpatient use)     Chief Complaint  Patient presents with  . Acute Visit    Possible pneumonia with Left Lower Lobe Infilterate    HPI:  Pt is a 86 y.o. female seen today for an acute visit for Left Lower Lobe Infilterate on Follow up Chest Xray Patient has h/o Hypertension, Anxiety, Restless leg Syndrome, Insomnia, Arthritis and Posterior Tendon Dysfunction with right foot Valgus in Right Knee with inability toAmbulate And a history of right lower extremity hematoma needing wound care. It is healed now  Patient had a routine x-ray done for follow-up of her right middle lobe infiltrate which was treated with antibiotics few weeks ago.  The x-ray showed left lower lobe  infiltrate Patient completely asymptomatic.  Denies any shortness of breath cough.  No fever She is alert.  Very responsive doing really well overall eating.   Past Medical History:  Diagnosis Date  . Anemia   . Hypertension   . OA (osteoarthritis)   . Osteoporosis   . Renal cyst    Past Surgical History:  Procedure Laterality Date  . APPENDECTOMY    . BLADDER SUSPENSION    . CATARACT EXTRACTION    . CESAREAN SECTION    . REPLACEMENT TOTAL KNEE  1995  . TONSILLECTOMY      No Known Allergies  Allergies as of 10/21/2019   No Known Allergies     Medication List       Accurate as of October 21, 2019 10:04 AM. If you have any questions, ask your nurse or doctor.        STOP taking these medications   gabapentin 100 MG capsule Commonly known as: NEURONTIN Stopped by: Virgie Dad, MD     TAKE these medications   acetaminophen 500 MG tablet Commonly known as: TYLENOL Take 1,000 mg by mouth every 8 (eight) hours as needed.   ALPRAZolam 0.25 MG tablet Commonly known as: XANAX Take one tab po bid   amLODipine 2.5 MG tablet Commonly known as: NORVASC Take 2.5 mg by mouth daily. What changed: Another medication with the same name was removed. Continue taking this medication, and follow the directions you see here. Changed by: Virgie Dad, MD   aspirin EC 81 MG  tablet Take 81 mg by mouth. Once A Morning on Mon, Thu   Biotin 5000 MCG Tabs Take 5,000 mcg by mouth daily.   docusate sodium 100 MG capsule Commonly known as: COLACE Take 100 mg by mouth daily.   escitalopram 5 MG tablet Commonly known as: LEXAPRO Take 5 mg by mouth every morning.   HYDROcodone-acetaminophen 5-325 MG tablet Commonly known as: NORCO/VICODIN Take 1 tablet by mouth every 6 (six) hours as needed for severe pain.   lactose free nutrition Liqd Take 237 mLs by mouth 2 (two) times daily between meals.   magnesium hydroxide 400 MG/5ML suspension Commonly known as: MILK OF  MAGNESIA Take by mouth daily as needed for mild constipation.   metoprolol tartrate 50 MG tablet Commonly known as: LOPRESSOR TAKE 1 TABLET BY MOUTH TWICE DAILY.   mirtazapine 7.5 MG tablet Commonly known as: REMERON TAKE ONE TABLET AT BEDTIME.   multivitamin-lutein Caps capsule Take 1 capsule by mouth 2 (two) times daily.   rOPINIRole 1 MG tablet Commonly known as: REQUIP TAKE 1 TABLET EACH EVENING.   Vitamin D 50 MCG (2000 UT) Caps Take 2,000 Units by mouth daily.   zinc oxide 20 % ointment Apply 1 application topically as needed for irritation.       Review of Systems  Constitutional: Positive for activity change.  HENT: Negative.   Respiratory: Negative.   Cardiovascular: Positive for leg swelling.  Genitourinary: Negative.   Musculoskeletal: Negative.   Skin: Negative.   Neurological: Positive for weakness.  Psychiatric/Behavioral: Negative.     Immunization History  Administered Date(s) Administered  . Influenza, High Dose Seasonal PF 07/10/2017, 07/15/2019  . Influenza,inj,Quad PF,6+ Mos 07/03/2018  . Influenza-Unspecified 07/10/2017  . Pneumococcal Conjugate-13 08/18/2014  . Pneumococcal Polysaccharide-23 07/26/2010  . Tdap 11/12/2018   Pertinent  Health Maintenance Due  Topic Date Due  . DEXA SCAN  07/22/2020 (Originally 05/16/1989)  . INFLUENZA VACCINE  Completed  . PNA vac Low Risk Adult  Completed   Fall Risk  08/12/2019 07/23/2019 07/15/2019 04/15/2019 09/02/2018  Falls in the past year? 0 0 0 0 0  Number falls in past yr: 0 0 0 0 0  Injury with Fall? - 0 - 0 0   Functional Status Survey:    Vitals:   10/21/19 0959  BP: 126/85  Pulse: 75  Resp: 20  Temp: (!) 97.5 F (36.4 C)  SpO2: 97%  Weight: 134 lb (60.8 kg)  Height: 5' (1.524 m)   Body mass index is 26.17 kg/m. Physical Exam Vitals reviewed.  Constitutional:      Appearance: Normal appearance.  HENT:     Head: Normocephalic.     Nose: Nose normal.     Mouth/Throat:      Mouth: Mucous membranes are moist.     Pharynx: Oropharynx is clear.  Eyes:     Pupils: Pupils are equal, round, and reactive to light.  Cardiovascular:     Rate and Rhythm: Normal rate and regular rhythm.     Pulses: Normal pulses.     Heart sounds: Murmur present.  Pulmonary:     Effort: Pulmonary effort is normal. No respiratory distress.     Breath sounds: Normal breath sounds. No wheezing or rales.  Abdominal:     General: Abdomen is flat. Bowel sounds are normal.     Palpations: Abdomen is soft.  Musculoskeletal:     Cervical back: Neck supple.  Skin:    General: Skin is warm.  Neurological:  Mental Status: She is alert.     Comments: Patient has mild weakness in her Right LE. Left was 4/5   Psychiatric:        Mood and Affect: Mood normal.        Thought Content: Thought content normal.     Labs reviewed: Recent Labs    01/01/19 0800 03/05/19 0810 07/23/19 0000  NA 139 140 143  K 3.6 3.4* 3.9  CL 99 99 102  CO2 31 31 28   GLUCOSE 89 84 78  BUN 27* 30* 29*  CREATININE 1.19* 1.14* 1.12*  CALCIUM 9.2 9.4 9.4   Recent Labs    01/01/19 0800 03/05/19 0810 07/23/19 0000  AST 16 17 17   ALT 9 8 9   BILITOT 0.5 0.6 0.7  PROT 6.5 6.6 6.8   Recent Labs    01/01/19 0800 03/05/19 0810 07/23/19 0000  WBC 6.1 6.3 5.6  NEUTROABS 3,093 3,213 2,699  HGB 12.8 13.0 12.8  HCT 37.8 38.9 38.4  MCV 100.5* 100.3* 101.9*  PLT 211 195 176   Lab Results  Component Value Date   TSH 1.54 07/23/2019   No results found for: HGBA1C Lab Results  Component Value Date   CHOL 175 07/23/2019   HDL 54 07/23/2019   LDLCALC 105 (H) 07/23/2019   TRIG 74 07/23/2019   CHOLHDL 3.2 07/23/2019    Significant Diagnostic Results in last 30 days:  No results found.  Assessment/Plan  Left lower lobe pulmonary infiltrate D/W her Son Dr Sabra Heck. Patient looks really good Clinically. No Coughing No fever Will continue to monitor No Antibiotics right now She still needs Oxygen  sometimes. Will use it PRN  Essential hypertension BP well Controlled on Low dose of Norvasc and Lopressor  Depression with anxiety Doing well on Remeron, Lexapro and Xanax PRN RLS (restless legs syndrome) On Requip  Anemia Hgb stable B12 was normal CKD Creat staying good  Acute right-sided low back pain with bilateral sciaticaS/P ? Compression Fractura Acute /Chronic Pain Much Improved On Norco Prn and Tylenol Neurontin Discontinued due to Nightmares Right Knee swelling Xrays were negative for any acute changes  Family/ staff Communication:   Labs/tests ordered:

## 2019-10-27 DIAGNOSIS — Z20828 Contact with and (suspected) exposure to other viral communicable diseases: Secondary | ICD-10-CM | POA: Diagnosis not present

## 2019-10-30 ENCOUNTER — Encounter: Payer: Self-pay | Admitting: Nurse Practitioner

## 2019-10-30 ENCOUNTER — Non-Acute Institutional Stay (SKILLED_NURSING_FACILITY): Payer: PPO | Admitting: Nurse Practitioner

## 2019-10-30 DIAGNOSIS — F418 Other specified anxiety disorders: Secondary | ICD-10-CM

## 2019-10-30 DIAGNOSIS — F5105 Insomnia due to other mental disorder: Secondary | ICD-10-CM

## 2019-10-30 DIAGNOSIS — I739 Peripheral vascular disease, unspecified: Secondary | ICD-10-CM | POA: Diagnosis not present

## 2019-10-30 DIAGNOSIS — K5901 Slow transit constipation: Secondary | ICD-10-CM | POA: Diagnosis not present

## 2019-10-30 DIAGNOSIS — I10 Essential (primary) hypertension: Secondary | ICD-10-CM

## 2019-10-30 NOTE — Progress Notes (Signed)
Location:   [place] Room Number: 1 Place of Service:  SNF (31) Provider:  Mosetta Ferdinand X, NP  Erykah Lippert X, NP  Patient Care Team: Verlia Kaney X, NP as PCP - General (Internal Medicine) Wardell Honour, MD as Attending Physician (Family Medicine)  Extended Emergency Contact Information Primary Emergency Contact: [facility] Address: [place] [address] [place] Phone: [phone] Mobile Phone: [phone] Relation: Son Secondary Emergency Contact: [facility] Address: [place]          [place], [place] [address] [place] Phone: [phone] Relation: Son  Code Status:  DNR Goals of care: Advanced Directive information Advanced Directives 10/30/2019  Does Patient Have a Medical Advance Directive? Yes  Type of Advance Directive Out of facility DNR (pink MOST or yellow form)  Does patient want to make changes to medical advance directive? No - Patient declined  Copy of [place] in Chart? -  Would patient like information on creating a medical advance directive? -  Pre-existing out of facility DNR order (yellow form or pink MOST form) Yellow form placed in chart (order not valid for inpatient use);Pink MOST form placed in chart (order not valid for inpatient use)     Chief Complaint  Patient presents with  . Acute Visit    Constipation    HPI:  Pt is a 86 y.o. female seen today for an acute visit for c/o constipation, prn Bisacodyl suppository is not adequate, she takes Colace qd, she is bed rest most of time. Chronic edema BLE. HTN, blood pressure is controlled on Amlodipine 2.5mg  qd, Metoprolol 50mg  bid. Her mood is stable, prn Alprazolam hs, Escitalopram 5mg  qd.    Past Medical History:  Diagnosis Date  . Anemia   . Hypertension   . OA (osteoarthritis)   . Osteoporosis   . Renal cyst    Past Surgical History:  Procedure Laterality Date  .  APPENDECTOMY    . BLADDER SUSPENSION    . CATARACT EXTRACTION    . CESAREAN SECTION    . REPLACEMENT TOTAL KNEE  1995  . TONSILLECTOMY      No Known Allergies  Allergies as of 10/30/2019   No Known Allergies     Medication List       Accurate as of October 30, 2019 11:59 PM. If you have any questions, ask your nurse or doctor.        STOP taking these medications   magnesium hydroxide 400 MG/5ML suspension Commonly known as: MILK OF MAGNESIA Stopped by: Lalena Salas X Nahome Bublitz, NP     TAKE these medications   acetaminophen 500 MG tablet Commonly known as: TYLENOL Take 1,000 mg by mouth every 8 (eight) hours as needed.   ALPRAZolam 0.25 MG tablet Commonly known as: XANAX Take 0.25 mg by mouth at bedtime as needed for anxiety. What changed: Another medication with the same name was removed. Continue taking this medication, and follow the directions you see here. Changed by: Alajia Schmelzer X Arlow Spiers, NP   amLODipine 2.5 MG tablet Commonly known as: NORVASC Take 2.5 mg by mouth daily.   aspirin EC 81 MG tablet Take 81 mg by mouth. Once A Morning on Mon, Thu   Biotin 5000 MCG Tabs Take 5,000 mcg by mouth daily.   bisacodyl 10 MG suppository Commonly known as: DULCOLAX Place 10 mg rectally every other day.   docusate  sodium 100 MG capsule Commonly known as: COLACE Take 100 mg by mouth daily.   escitalopram 5 MG tablet Commonly known as: LEXAPRO Take 5 mg by mouth every morning.   HYDROcodone-acetaminophen 5-325 MG tablet Commonly known as: NORCO/VICODIN Take 0.5 tablets by mouth every 6 (six) hours as needed for moderate pain. What changed: Another medication with the same name was removed. Continue taking this medication, and follow the directions you see here. Changed by: Stefannie Defeo X Riyanshi Wahab, NP   lactose free nutrition Liqd Take 237 mLs by mouth 2 (two) times daily between meals.   metoprolol tartrate 50 MG tablet Commonly known as: LOPRESSOR TAKE 1 TABLET BY MOUTH TWICE DAILY.     mirtazapine 7.5 MG tablet Commonly known as: REMERON TAKE ONE TABLET AT BEDTIME.   multivitamin-lutein Caps capsule Take 1 capsule by mouth 2 (two) times daily.   rOPINIRole 1 MG tablet Commonly known as: REQUIP TAKE 1 TABLET EACH EVENING.   Vitamin D 50 MCG (2000 UT) Caps Take 2,000 Units by mouth daily.   zinc oxide 20 % ointment Apply 1 application topically as needed for irritation.       Review of Systems  Constitutional: Negative for activity change, appetite change, chills, diaphoresis, fatigue and fever.  HENT: Positive for hearing loss. Negative for congestion and voice change.   Eyes: Negative for visual disturbance.  Respiratory: Negative for cough, shortness of breath and wheezing.   Cardiovascular: Positive for leg swelling. Negative for chest pain and palpitations.  Gastrointestinal: Positive for constipation. Negative for abdominal distention, abdominal pain, diarrhea, nausea and vomiting.  Genitourinary: Negative for difficulty urinating, dysuria and urgency.       Incontinent of urine.   Musculoskeletal: Positive for arthralgias and gait problem.  Skin: Negative for color change and pallor.  Neurological: Negative for dizziness, speech difficulty, weakness and headaches.  Psychiatric/Behavioral: Negative for agitation, behavioral problems, hallucinations and sleep disturbance. The patient is not nervous/anxious.     Immunization History  Administered Date(s) Administered  . Influenza, High Dose Seasonal PF 07/10/2017, 07/15/2019  . Influenza,inj,Quad PF,6+ Mos 07/03/2018  . Influenza-Unspecified 07/10/2017  . Pneumococcal Conjugate-13 08/18/2014  . Pneumococcal Polysaccharide-23 07/26/2010  . Tdap 11/12/2018   Pertinent  Health Maintenance Due  Topic Date Due  . DEXA SCAN  07/22/2020 (Originally 05/16/1989)  . INFLUENZA VACCINE  Completed  . PNA vac Low Risk Adult  Completed   Fall Risk  08/12/2019 07/23/2019 07/15/2019 04/15/2019 09/02/2018  Falls  in the past year? 0 0 0 0 0  Number falls in past yr: 0 0 0 0 0  Injury with Fall? - 0 - 0 0   Functional Status Survey:    Vitals:   10/30/19 1330  BP: 133/88  Pulse: 63  Resp: 16  Temp: (!) 96.7 F (35.9 C)  SpO2: 92%  Weight: 138 lb 8 oz (62.8 kg)  Height: 5' (1.524 m)   Body mass index is 27.05 kg/m. Physical Exam Vitals and nursing note reviewed.  Constitutional:      General: She is not in acute distress.    Appearance: Normal appearance. She is not ill-appearing, toxic-appearing or diaphoretic.  HENT:     Head: Normocephalic and atraumatic.     Nose: Nose normal.     Mouth/Throat:     Mouth: Mucous membranes are moist.  Eyes:     Extraocular Movements: Extraocular movements intact.     Conjunctiva/sclera: Conjunctivae normal.     Pupils: Pupils are equal, round, and reactive to  light.  Cardiovascular:     Rate and Rhythm: Normal rate and regular rhythm.     Heart sounds: Murmur present.  Pulmonary:     Breath sounds: No wheezing, rhonchi or rales.  Abdominal:     General: Bowel sounds are normal. There is no distension.     Palpations: Abdomen is soft.     Tenderness: There is no abdominal tenderness. There is no right CVA tenderness, left CVA tenderness, guarding or rebound.  Musculoskeletal:     Cervical back: Normal range of motion and neck supple.     Right lower leg: Edema present.     Left lower leg: Edema present.     Comments: 1-2+ edema BLE, right ankle turned inward, uses knee support.   Skin:    General: Skin is warm and dry.  Neurological:     General: No focal deficit present.     Mental Status: She is alert and oriented to person, place, and time. Mental status is at baseline.     Motor: No weakness.     Coordination: Coordination normal.     Gait: Gait abnormal.  Psychiatric:        Mood and Affect: Mood normal.        Behavior: Behavior normal.        Thought Content: Thought content normal.     Labs reviewed: Recent Labs     01/01/19 0800 01/01/19 0800 03/05/19 0810 07/23/19 0000 10/12/19 0000  NA 139   < > 140 143 139  K 3.6   < > 3.4* 3.9 4.3  CL 99   < > 99 102 95*  CO2 31   < > 31 28 38*  GLUCOSE 89  --  84 78  --   BUN 27*   < > 30* 29* 14  CREATININE 1.19*   < > 1.14* 1.12* 0.8  CALCIUM 9.2   < > 9.4 9.4 9.2   < > = values in this interval not displayed.   Recent Labs    01/01/19 0800 03/05/19 0810 07/23/19 0000  AST 16 17 17   ALT 9 8 9   BILITOT 0.5 0.6 0.7  PROT 6.5 6.6 6.8   Recent Labs    01/01/19 0800 01/01/19 0800 03/05/19 0810 07/23/19 0000 10/12/19 0000  WBC 6.1   < > 6.3 5.6 7.0  NEUTROABS 3,093   < > 3,213 2,699 3,101  HGB 12.8   < > 13.0 12.8 11.2*  HCT 37.8   < > 38.9 38.4 34*  MCV 100.5*  --  100.3* 101.9*  --   PLT 211   < > 195 176 232   < > = values in this interval not displayed.   Lab Results  Component Value Date   TSH 1.54 07/23/2019   No results found for: HGBA1C Lab Results  Component Value Date   CHOL 175 07/23/2019   HDL 54 07/23/2019   LDLCALC 105 (H) 07/23/2019   TRIG 74 07/23/2019   CHOLHDL 3.2 07/23/2019    Significant Diagnostic Results in last 30 days:  No results found.  Assessment/Plan Slow transit constipation Adding Senokot S II po qhs 10/30/19, continue Colace daily, prn Bisacodyl suppository.   Essential hypertension Blood pressure is controlled, continue Metoprolol, Amlodipine.   PVD (peripheral vascular disease) (HCC) Chronic edema BLE, no open wounds presently.  Insomnia secondary to depression with anxiety Her mood is stable, continue Escitalopram, prn Alprazolam.      Family/ staff Communication: plan of  care reviewed with the patient and charge nurse.   Labs/tests ordered:  none  Time spend 25 minutes.

## 2019-11-02 ENCOUNTER — Encounter: Payer: Self-pay | Admitting: Nurse Practitioner

## 2019-11-02 DIAGNOSIS — R2681 Unsteadiness on feet: Secondary | ICD-10-CM | POA: Diagnosis not present

## 2019-11-02 DIAGNOSIS — M6281 Muscle weakness (generalized): Secondary | ICD-10-CM | POA: Diagnosis not present

## 2019-11-02 DIAGNOSIS — R293 Abnormal posture: Secondary | ICD-10-CM | POA: Diagnosis not present

## 2019-11-02 DIAGNOSIS — D649 Anemia, unspecified: Secondary | ICD-10-CM | POA: Diagnosis not present

## 2019-11-02 DIAGNOSIS — Z9181 History of falling: Secondary | ICD-10-CM | POA: Diagnosis not present

## 2019-11-02 NOTE — Assessment & Plan Note (Signed)
Adding Senokot S II po qhs 10/30/19, continue Colace daily, prn Bisacodyl suppository.

## 2019-11-02 NOTE — Assessment & Plan Note (Signed)
Chronic edema BLE, no open wounds presently.

## 2019-11-02 NOTE — Assessment & Plan Note (Signed)
Her mood is stable, continue Escitalopram, prn Alprazolam.

## 2019-11-02 NOTE — Assessment & Plan Note (Signed)
Blood pressure is controlled, continue Metoprolol, Amlodipine.  

## 2019-11-03 DIAGNOSIS — Z20828 Contact with and (suspected) exposure to other viral communicable diseases: Secondary | ICD-10-CM | POA: Diagnosis not present

## 2019-11-09 ENCOUNTER — Other Ambulatory Visit: Payer: Self-pay

## 2019-11-09 ENCOUNTER — Non-Acute Institutional Stay: Payer: PPO | Admitting: *Deleted

## 2019-11-09 VITALS — BP 124/78 | HR 66 | Temp 97.3°F | Resp 18 | Wt 137.9 lb

## 2019-11-09 DIAGNOSIS — Z515 Encounter for palliative care: Secondary | ICD-10-CM

## 2019-11-09 NOTE — Progress Notes (Signed)
COMMUNITY PALLIATIVE CARE RN NOTE  PATIENT NAME: Ann Jensen DOB: 16-Apr-1922 MRN: [id number]  PRIMARY CARE PROVIDER: Mast, Man X, NP  RESPONSIBLE PARTY: Alain Honey (son) Acct ID - Guarantor Home Phone Work Phone Relationship Acct Type  [id number] LARUE, [phone]  Self P/F     [address], [place], [place] [address]   Covid-19 Pre-screening Negative  PLAN OF CARE and INTERVENTION:  1. ADVANCE CARE PLANNING/GOALS OF CARE: Goal is for patient to remain at facility on the SNF unit. She has a DNR. 2. PATIENT/CAREGIVER EDUCATION: Symptom Management 3. DISEASE STATUS: Patient is sitting up in her wheelchair awake and alert. She is able to engage in appropriate conversation and make her needs known. She is forgetful at times. She denies pain. She does have PRN Norco available for severe pain. Respirations are even, regular and unlabored. She is transferred via [place] and 2 person assistance. She requires 1 person assistance with bathing and dressing. She uses the bed pan for bowel movements and has a Purewick system for her bladder. She wears adult briefs. She is able to feed herself independently with tray set up. She has completed PT/OT. She has a cushion placed in her wheelchair for right sided lateral support. Her intake is mainly between 25-50% of meals. She has Boost Plus scheduled twice daily, but she does refuse these supplements at times. She denies dysphagia, and is able to take her medications whole with water without difficulty. Her anxiety has lessened since she is becoming more accustomed to being on the skilled care unit. She is now only taking Xanax at night, vs twice daily. She is sleeping well. She wears compression stockings and wraps to help with bilateral lower extremity edema. She has no skin issues or breakdown at this time. Will continue to monitor.  HISTORY OF PRESENT ILLNESS: This is a 86 yo female who currently resides at [facility] on the SNF  unit. Palliative care team asked to follow patient for additional support. Team to continue to visit patient monthly and PRN.   CODE STATUS: DNR ADVANCED DIRECTIVES: Y MOST FORM: yes PPS: 30%   PHYSICAL EXAM:   VITALS: Today's Vitals   11/09/19 1456  BP: 124/78  Pulse: 66  Resp: 18  Temp: (!) 97.3 F (36.3 C)  TempSrc: Other (Comment)  SpO2: 92%  Weight: 137 lb 14.4 oz (62.6 kg)  PainSc: 0-No pain    LUNGS: clear to auscultation  CARDIAC: Cor RRR EXTREMITIES: Trace bilateral lower extremity edema SKIN: Skin color, texture, turgor normal. No rashes or lesions  NEURO: Alert and oriented x 3, forgetful at times, generalized weakness, non-ambulatory   (Duration of visit and documentation 45 minutes)    [name], RN BSN

## 2019-11-12 DIAGNOSIS — Z20828 Contact with and (suspected) exposure to other viral communicable diseases: Secondary | ICD-10-CM | POA: Diagnosis not present

## 2019-11-13 ENCOUNTER — Other Ambulatory Visit: Payer: Self-pay

## 2019-11-13 MED ORDER — ALPRAZOLAM 0.25 MG PO TABS: 0.2500 mg | ORAL_TABLET | Freq: Every evening | ORAL | 2 refills | Status: DC | PRN

## 2019-11-16 ENCOUNTER — Non-Acute Institutional Stay (SKILLED_NURSING_FACILITY): Payer: PPO | Admitting: Nurse Practitioner

## 2019-11-16 DIAGNOSIS — M159 Polyosteoarthritis, unspecified: Secondary | ICD-10-CM | POA: Diagnosis not present

## 2019-11-16 DIAGNOSIS — F418 Other specified anxiety disorders: Secondary | ICD-10-CM | POA: Diagnosis not present

## 2019-11-16 DIAGNOSIS — I10 Essential (primary) hypertension: Secondary | ICD-10-CM

## 2019-11-16 DIAGNOSIS — F5105 Insomnia due to other mental disorder: Secondary | ICD-10-CM | POA: Diagnosis not present

## 2019-11-16 DIAGNOSIS — K5901 Slow transit constipation: Secondary | ICD-10-CM

## 2019-11-16 DIAGNOSIS — G2581 Restless legs syndrome: Secondary | ICD-10-CM | POA: Diagnosis not present

## 2019-11-17 ENCOUNTER — Encounter: Payer: Self-pay | Admitting: Nurse Practitioner

## 2019-11-17 DIAGNOSIS — Z20828 Contact with and (suspected) exposure to other viral communicable diseases: Secondary | ICD-10-CM | POA: Diagnosis not present

## 2019-11-17 NOTE — Progress Notes (Signed)
Location:   [place] Room Number: 1 Place of Service:  SNF (31) Provider: Tarris Delbene X, NP  Inika Bellanger X, NP  Patient Care Team: Darrion Wyszynski X, NP as PCP - General (Internal Medicine) Wardell Honour, MD as Attending Physician (Family Medicine)  Extended Emergency Contact Information Primary Emergency Contact: [facility] Address: [place] [address] [place] Phone: [phone] Mobile Phone: [phone] Relation: Son Secondary Emergency Contact: [facility] Address: [address]          [place], [place] [address] [place] Phone: [phone] Relation: Son  Code Status:  DNR Goals of care: Advanced Directive information Advanced Directives 11/17/2019  Does Patient Have a Medical Advance Directive? Yes  Type of Advance Directive Out of facility DNR (pink MOST or yellow form)  Does patient want to make changes to medical advance directive? No - Patient declined  Copy of [place] in Chart? -  Would patient like information on creating a medical advance directive? -  Pre-existing out of facility DNR order (yellow form or pink MOST form) Yellow form placed in chart (order not valid for inpatient use);Pink MOST form placed in chart (order not valid for inpatient use)     Chief Complaint  Patient presents with  . Medical Management of Chronic Issues    HPI:  Pt is a 86 y.o. female seen today for medical management of chronic diseases.    The patient resides in SNF [facility] for safety, care assistance. She is non ambulatory state, severe osteoarthritic pain, mostly in the right knee, worsened with movement, especially at night with bathroom trips, prn Tylenol, Norco available to her. She is still continent of B+B. Hx of HTN, blood pressure is controlled on Amlodipine 2.5mg  qd, Metoprolol 50mg  bid. Her mood is stable on Escitalopram 5mg  qd, Mirtazapine 7.5mg  qd. RLS, stable,  on Requip 1mg  qd. No constipation, on Senokot S II qd   Past Medical History:  Diagnosis Date  . Anemia   . Hypertension   . OA (osteoarthritis)   . Osteoporosis   . Renal cyst    Past Surgical History:  Procedure Laterality Date  . APPENDECTOMY    . BLADDER SUSPENSION    . CATARACT EXTRACTION    . CESAREAN SECTION    . REPLACEMENT TOTAL KNEE  1995  . TONSILLECTOMY      No Known Allergies  Allergies as of 11/16/2019   No Known Allergies     Medication List       Accurate as of November 16, 2019 11:59 PM. If you have any questions, ask your nurse or doctor.        STOP taking these medications   ALPRAZolam 0.25 MG tablet Commonly known as: XANAX     TAKE these medications   acetaminophen 500 MG tablet Commonly known as: TYLENOL Take 1,000 mg by mouth every 8 (eight) hours as needed.   amLODipine 2.5 MG tablet Commonly known as: NORVASC Take 2.5 mg by mouth daily.   aspirin EC 81 MG tablet Take 81 mg by mouth. Once A Morning on Mon, Thu   Biotin 5000 MCG Tabs Take 5,000 mcg by mouth daily.   bisacodyl 10 MG suppository Commonly known as: DULCOLAX Place 10 mg rectally every other day.   docusate sodium 100 MG capsule Commonly known as: COLACE Take 100 mg by mouth daily.   escitalopram 5  MG tablet Commonly known as: LEXAPRO Take 5 mg by mouth every morning.   HYDROcodone-acetaminophen 5-325 MG tablet Commonly known as: NORCO/VICODIN Take 0.5 tablets by mouth every 6 (six) hours as needed for moderate pain.   lactose free nutrition Liqd Take 237 mLs by mouth 2 (two) times daily between meals.   metoprolol tartrate 50 MG tablet Commonly known as: LOPRESSOR TAKE 1 TABLET BY MOUTH TWICE DAILY.   mirtazapine 7.5 MG tablet Commonly known as: REMERON TAKE ONE TABLET AT BEDTIME.   multivitamin-lutein Caps capsule Take 1 capsule by mouth 2 (two) times daily.   rOPINIRole 1 MG tablet Commonly known as: REQUIP TAKE 1 TABLET EACH EVENING.     sennosides-docusate sodium 8.6-50 MG tablet Commonly known as: SENOKOT-S Take 2 tablets by mouth daily.   Vitamin D 50 MCG (2000 UT) Caps Take 2,000 Units by mouth daily.   zinc oxide 20 % ointment Apply 1 application topically as needed for irritation.       Review of Systems  Constitutional: Negative for activity change, appetite change, chills, diaphoresis, fatigue and fever.  HENT: Positive for hearing loss. Negative for congestion and voice change.   Eyes: Negative for visual disturbance.  Respiratory: Negative for cough, shortness of breath and wheezing.   Cardiovascular: Positive for leg swelling. Negative for chest pain and palpitations.  Gastrointestinal: Positive for constipation. Negative for abdominal distention, abdominal pain, diarrhea, nausea and vomiting.  Genitourinary: Negative for difficulty urinating, dysuria and urgency.  Musculoskeletal: Positive for arthralgias and gait problem.  Skin: Negative for color change and pallor.  Neurological: Negative for dizziness, speech difficulty, weakness and headaches.  Psychiatric/Behavioral: Negative for agitation, behavioral problems, hallucinations and sleep disturbance. The patient is not nervous/anxious.     Immunization History  Administered Date(s) Administered  . Influenza, High Dose Seasonal PF 07/10/2017, 07/15/2019  . Influenza,inj,Quad PF,6+ Mos 07/03/2018  . Influenza-Unspecified 07/10/2017  . Moderna SARS-COVID-2 Vaccination 10/05/2019, 11/02/2019  . Pneumococcal Conjugate-13 08/18/2014  . Pneumococcal Polysaccharide-23 07/26/2010  . Tdap 11/12/2018   Pertinent  Health Maintenance Due  Topic Date Due  . DEXA SCAN  07/22/2020 (Originally 05/16/1989)  . INFLUENZA VACCINE  Completed  . PNA vac Low Risk Adult  Completed   Fall Risk  08/12/2019 07/23/2019 07/15/2019 04/15/2019 09/02/2018  Falls in the past year? 0 0 0 0 0  Number falls in past yr: 0 0 0 0 0  Injury with Fall? - 0 - 0 0   Functional  Status Survey:    Vitals:   11/17/19 1257  BP: 124/84  Pulse: 83  Resp: 20  Temp: (!) 97.3 F (36.3 C)  SpO2: 91%  Weight: 137 lb 14.4 oz (62.6 kg)  Height: 5' (1.524 m)   Body mass index is 26.93 kg/m. Physical Exam Vitals and nursing note reviewed.  Constitutional:      General: She is not in acute distress.    Appearance: Normal appearance. She is not ill-appearing, toxic-appearing or diaphoretic.  HENT:     Head: Normocephalic and atraumatic.     Nose: Nose normal.     Mouth/Throat:     Mouth: Mucous membranes are moist.  Eyes:     Extraocular Movements: Extraocular movements intact.     Conjunctiva/sclera: Conjunctivae normal.     Pupils: Pupils are equal, round, and reactive to light.  Cardiovascular:     Rate and Rhythm: Normal rate and regular rhythm.     Heart sounds: Murmur present.  Pulmonary:     Breath sounds:  No wheezing, rhonchi or rales.  Abdominal:     General: Bowel sounds are normal. There is no distension.     Palpations: Abdomen is soft.     Tenderness: There is no abdominal tenderness. There is no right CVA tenderness, left CVA tenderness, guarding or rebound.  Musculoskeletal:        General: Tenderness present.     Cervical back: Normal range of motion and neck supple.     Right lower leg: Edema present.     Left lower leg: Edema present.     Comments: 1-2+ edema BLE, right ankle turned outward, severe right knee pain with ROM, no longer ambulatory or weight bearing.   Skin:    General: Skin is warm and dry.  Neurological:     General: No focal deficit present.     Mental Status: She is alert and oriented to person, place, and time. Mental status is at baseline.     Motor: No weakness.     Coordination: Coordination normal.     Gait: Gait abnormal.  Psychiatric:        Mood and Affect: Mood normal.        Behavior: Behavior normal.        Thought Content: Thought content normal.     Labs reviewed: Recent Labs    01/01/19 0800  01/01/19 0800 03/05/19 0810 07/23/19 0000 10/12/19 0000  NA 139   < > 140 143 139  K 3.6   < > 3.4* 3.9 4.3  CL 99   < > 99 102 95*  CO2 31   < > 31 28 38*  GLUCOSE 89  --  84 78  --   BUN 27*   < > 30* 29* 14  CREATININE 1.19*   < > 1.14* 1.12* 0.8  CALCIUM 9.2   < > 9.4 9.4 9.2   < > = values in this interval not displayed.   Recent Labs    01/01/19 0800 03/05/19 0810 07/23/19 0000  AST 16 17 17   ALT 9 8 9   BILITOT 0.5 0.6 0.7  PROT 6.5 6.6 6.8   Recent Labs    01/01/19 0800 01/01/19 0800 03/05/19 0810 07/23/19 0000 10/12/19 0000  WBC 6.1   < > 6.3 5.6 7.0  NEUTROABS 3,093   < > 3,213 2,699 3,101  HGB 12.8   < > 13.0 12.8 11.2*  HCT 37.8   < > 38.9 38.4 34*  MCV 100.5*  --  100.3* 101.9*  --   PLT 211   < > 195 176 232   < > = values in this interval not displayed.   Lab Results  Component Value Date   TSH 1.54 07/23/2019   No results found for: HGBA1C Lab Results  Component Value Date   CHOL 175 07/23/2019   HDL 54 07/23/2019   LDLCALC 105 (H) 07/23/2019   TRIG 74 07/23/2019   CHOLHDL 3.2 07/23/2019    Significant Diagnostic Results in last 30 days:  No results found.  Assessment/Plan Osteoarthritis of multiple joints Hx of L5 lumbar vertebra fx, posterior tibial tendon dysfunction, right foot turned outward deformity, severe right knee arthritis-pain with ROM. Prn Tylenol, Norco available to her. The patient is continent of urine, getting out of bed at night for bathroom trips has caused her a great deal of pain/disocmort, [place] system has provided comfort measures/quality of life  to the patient and is necessary medically.   Essential hypertension Blood pressure is controlled,  continue Amlodipine, Metoprolol.   Slow transit constipation Stable, continue Senokot S II qd.   RLS (restless legs syndrome) Stable, continue Requip.   Insomnia secondary to depression with anxiety Her mood is stable, eats/sleeps well presently, continue  Escitalopram, Mirtazapine.      Family/ staff Communication: plan of care reviewed with the patient and charge nurse.   Labs/tests ordered:  none  Time spend 25 minutes.

## 2019-11-18 ENCOUNTER — Encounter: Payer: Self-pay | Admitting: Nurse Practitioner

## 2019-11-18 NOTE — Assessment & Plan Note (Signed)
Stable, continue Requip

## 2019-11-18 NOTE — Assessment & Plan Note (Signed)
Hx of L5 lumbar vertebra fx, posterior tibial tendon dysfunction, right foot turned outward deformity, severe right knee arthritis-pain with ROM. Prn Tylenol, Norco available to her. The patient is continent of urine, getting out of bed at night for bathroom trips has caused her a great deal of pain/disocmort, Ann Jensen system has provided comfort measures/quality of life  to the patient and is necessary medically.

## 2019-11-18 NOTE — Assessment & Plan Note (Signed)
Stable, continue Senokot S II qd  

## 2019-11-18 NOTE — Assessment & Plan Note (Signed)
Her mood is stable, eats/sleeps well presently, continue Escitalopram, Mirtazapine.

## 2019-11-18 NOTE — Assessment & Plan Note (Signed)
Blood pressure is controlled, continue Amlodipine, Metoprolol.   

## 2019-11-24 DIAGNOSIS — Z20828 Contact with and (suspected) exposure to other viral communicable diseases: Secondary | ICD-10-CM | POA: Diagnosis not present

## 2019-11-27 DIAGNOSIS — M79672 Pain in left foot: Secondary | ICD-10-CM | POA: Diagnosis not present

## 2019-11-27 DIAGNOSIS — M79671 Pain in right foot: Secondary | ICD-10-CM | POA: Diagnosis not present

## 2019-11-27 DIAGNOSIS — L84 Corns and callosities: Secondary | ICD-10-CM | POA: Diagnosis not present

## 2019-11-27 DIAGNOSIS — B351 Tinea unguium: Secondary | ICD-10-CM | POA: Diagnosis not present

## 2019-11-27 DIAGNOSIS — Q6689 Other  specified congenital deformities of feet: Secondary | ICD-10-CM | POA: Diagnosis not present

## 2019-12-01 DIAGNOSIS — Z20828 Contact with and (suspected) exposure to other viral communicable diseases: Secondary | ICD-10-CM | POA: Diagnosis not present

## 2019-12-22 ENCOUNTER — Non-Acute Institutional Stay (SKILLED_NURSING_FACILITY): Payer: PPO | Admitting: Nurse Practitioner

## 2019-12-22 ENCOUNTER — Encounter: Payer: Self-pay | Admitting: Nurse Practitioner

## 2019-12-22 DIAGNOSIS — K5901 Slow transit constipation: Secondary | ICD-10-CM | POA: Diagnosis not present

## 2019-12-22 DIAGNOSIS — F418 Other specified anxiety disorders: Secondary | ICD-10-CM

## 2019-12-22 DIAGNOSIS — M76819 Anterior tibial syndrome, unspecified leg: Secondary | ICD-10-CM | POA: Diagnosis not present

## 2019-12-22 DIAGNOSIS — G2581 Restless legs syndrome: Secondary | ICD-10-CM

## 2019-12-22 DIAGNOSIS — Z9181 History of falling: Secondary | ICD-10-CM | POA: Diagnosis not present

## 2019-12-22 DIAGNOSIS — F5105 Insomnia due to other mental disorder: Secondary | ICD-10-CM

## 2019-12-22 DIAGNOSIS — I739 Peripheral vascular disease, unspecified: Secondary | ICD-10-CM | POA: Diagnosis not present

## 2019-12-22 DIAGNOSIS — M159 Polyosteoarthritis, unspecified: Secondary | ICD-10-CM

## 2019-12-22 DIAGNOSIS — R293 Abnormal posture: Secondary | ICD-10-CM | POA: Diagnosis not present

## 2019-12-22 DIAGNOSIS — M6281 Muscle weakness (generalized): Secondary | ICD-10-CM | POA: Diagnosis not present

## 2019-12-22 DIAGNOSIS — I10 Essential (primary) hypertension: Secondary | ICD-10-CM | POA: Diagnosis not present

## 2019-12-22 DIAGNOSIS — M25569 Pain in unspecified knee: Secondary | ICD-10-CM | POA: Diagnosis not present

## 2019-12-22 NOTE — Progress Notes (Signed)
Location:   [facility] Room Number: 1 Place of Service:  SNF (31) Provider:  Athel Merriweather X, NP  Seville Downs X, NP  Patient Care Team: Tamsyn Owusu X, NP as PCP - General (Internal Medicine) Wardell Honour, MD as Attending Physician (Family Medicine)  Extended Emergency Contact Information Primary Emergency Contact: [facility] Address: [place] [address] [place] Phone: [phone] Mobile Phone: [phone] Relation: Son Secondary Emergency Contact: [facility] Address: [address]          [place], [place] [address] [place] Phone: [phone] Relation: Son  Code Status:  DNR Goals of care: Advanced Directive information Advanced Directives 12/22/2019  Does Patient Have a Medical Advance Directive? Yes  Type of Advance Directive Out of facility DNR (pink MOST or yellow form)  Does patient want to make changes to medical advance directive? No - Patient declined  Copy of [place] in Chart? -  Would patient like information on creating a medical advance directive? -  Pre-existing out of facility DNR order (yellow form or pink MOST form) Yellow form placed in chart (order not valid for inpatient use);Pink MOST form placed in chart (order not valid for inpatient use)     Chief Complaint  Patient presents with  . Medical Management of Chronic Issues    HPI:  Pt is a 86 y.o. female seen today for medical management of chronic diseases.    The patient resides in SNF [facility] for safety, care assistance, w/c for mobility, her mood is stable, on Escitalopram 5mg  qd, Mirtazapine 7.5mg  qd. Chronic OA, mainly in the right knee, s/p TKR, right ankle turned laterally, prn Tylenol 1000mg  q8hr, Norco 5/325mg  1/2 tab q6hr prn available to her. RLS, stable, on Requip 1mg  qpm. HTN, blood pressure is controlled on Amlodipine 2.5mg  qd, Metoprolol 50mg  bid.  Constipation, stable, on prn  Dulcolax, Colace 100mg  qd, Senokot S II qd.    Past Medical History:  Diagnosis Date  . Anemia   . Hypertension   . OA (osteoarthritis)   . Osteoporosis   . Renal cyst    Past Surgical History:  Procedure Laterality Date  . APPENDECTOMY    . BLADDER SUSPENSION    . CATARACT EXTRACTION    . CESAREAN SECTION    . REPLACEMENT TOTAL KNEE  1995  . TONSILLECTOMY      No Known Allergies  Allergies as of 12/22/2019   No Known Allergies     Medication List       Accurate as of December 22, 2019 11:45 AM. If you have any questions, ask your nurse or doctor.        acetaminophen 500 MG tablet Commonly known as: TYLENOL Take 1,000 mg by mouth every 8 (eight) hours as needed.   amLODipine 2.5 MG tablet Commonly known as: NORVASC Take 2.5 mg by mouth daily.   aspirin EC 81 MG tablet Take 81 mg by mouth. Once A Morning on Mon, Thu   Biotin 5000 MCG Tabs Take 5,000 mcg by mouth daily.   bisacodyl 10 MG suppository Commonly known as: DULCOLAX Place 10 mg rectally every other day.   docusate sodium 100 MG capsule Commonly known as: COLACE Take 100 mg by mouth daily.   escitalopram 5 MG tablet Commonly known as: LEXAPRO Take 5 mg by mouth every morning.   HYDROcodone-acetaminophen 5-325 MG tablet Commonly known as:  NORCO/VICODIN Take 0.5 tablets by mouth every 6 (six) hours as needed for moderate pain.   lactose free nutrition Liqd Take 237 mLs by mouth 2 (two) times daily between meals.   metoprolol tartrate 50 MG tablet Commonly known as: LOPRESSOR TAKE 1 TABLET BY MOUTH TWICE DAILY.   mirtazapine 7.5 MG tablet Commonly known as: REMERON TAKE ONE TABLET AT BEDTIME.   multivitamin-lutein Caps capsule Take 1 capsule by mouth 2 (two) times daily.   rOPINIRole 1 MG tablet Commonly known as: REQUIP TAKE 1 TABLET EACH EVENING.   sennosides-docusate sodium 8.6-50 MG tablet Commonly known as: SENOKOT-S Take 2 tablets by mouth daily.   Vitamin D 50 MCG (2000  UT) Caps Take 2,000 Units by mouth daily.   zinc oxide 20 % ointment Apply 1 application topically as needed for irritation.       Review of Systems  Constitutional: Negative for activity change, appetite change, fatigue, fever and unexpected weight change.  HENT: Positive for hearing loss. Negative for congestion and voice change.   Eyes: Negative for visual disturbance.  Respiratory: Negative for cough and shortness of breath.   Cardiovascular: Positive for leg swelling. Negative for chest pain and palpitations.  Gastrointestinal: Positive for constipation. Negative for abdominal distention and abdominal pain.  Genitourinary: Negative for difficulty urinating, dysuria and urgency.  Musculoskeletal: Positive for arthralgias and gait problem.  Skin: Negative for color change.  Neurological: Negative for dizziness, speech difficulty and headaches.  Psychiatric/Behavioral: Negative for agitation, behavioral problems and sleep disturbance. The patient is not nervous/anxious.     Immunization History  Administered Date(s) Administered  . Influenza, High Dose Seasonal PF 07/10/2017, 07/15/2019  . Influenza,inj,Quad PF,6+ Mos 07/03/2018  . Influenza-Unspecified 07/10/2017  . Moderna SARS-COVID-2 Vaccination 10/05/2019, 11/02/2019  . Pneumococcal Conjugate-13 08/18/2014  . Pneumococcal Polysaccharide-23 07/26/2010  . Tdap 11/12/2018   Pertinent  Health Maintenance Due  Topic Date Due  . DEXA SCAN  07/22/2020 (Originally 05/16/1989)  . INFLUENZA VACCINE  Completed  . PNA vac Low Risk Adult  Completed   Fall Risk  08/12/2019 07/23/2019 07/15/2019 04/15/2019 09/02/2018  Falls in the past year? 0 0 0 0 0  Number falls in past yr: 0 0 0 0 0  Injury with Fall? - 0 - 0 0   Functional Status Survey:    Vitals:   12/22/19 0903  BP: 136/87  Pulse: 78  Resp: 18  Temp: (!) 96.9 F (36.1 C)  SpO2: 92%  Weight: 137 lb 9.6 oz (62.4 kg)  Height: 5' (1.524 m)   Body mass index is 26.87  kg/m. Physical Exam Vitals and nursing note reviewed.  Constitutional:      General: She is not in acute distress.    Appearance: Normal appearance. She is not ill-appearing.     Comments: Over weight  HENT:     Head: Normocephalic and atraumatic.     Mouth/Throat:     Mouth: Mucous membranes are moist.  Eyes:     Extraocular Movements: Extraocular movements intact.     Conjunctiva/sclera: Conjunctivae normal.     Pupils: Pupils are equal, round, and reactive to light.  Cardiovascular:     Rate and Rhythm: Normal rate and regular rhythm.     Heart sounds: Murmur present.     Comments: The R DP pulse was not felt.  Pulmonary:     Breath sounds: No wheezing, rhonchi or rales.  Abdominal:     General: Bowel sounds are normal. There is no distension.  Palpations: Abdomen is soft.     Tenderness: There is no abdominal tenderness.  Musculoskeletal:        General: Tenderness present.     Cervical back: Normal range of motion and neck supple.     Right lower leg: Edema present.     Left lower leg: Edema present.     Comments: Trace edema  edema BLE, R>L, right ankle turned outward, severe right knee pain with ROM, no longer ambulatory or weight bearing. Medial right knee pain palpated, no warmth or redness, s/p TKR  Skin:    General: Skin is warm and dry.  Neurological:     General: No focal deficit present.     Mental Status: She is alert and oriented to person, place, and time. Mental status is at baseline.     Motor: No weakness.     Coordination: Coordination normal.     Gait: Gait abnormal.  Psychiatric:        Mood and Affect: Mood normal.        Behavior: Behavior normal.        Thought Content: Thought content normal.     Labs reviewed: Recent Labs    01/01/19 0800 01/01/19 0800 03/05/19 0810 07/23/19 0000 10/12/19 0000  NA 139   < > 140 143 139  K 3.6   < > 3.4* 3.9 4.3  CL 99   < > 99 102 95*  CO2 31   < > 31 28 38*  GLUCOSE 89  --  84 78  --   BUN  27*   < > 30* 29* 14  CREATININE 1.19*   < > 1.14* 1.12* 0.8  CALCIUM 9.2   < > 9.4 9.4 9.2   < > = values in this interval not displayed.   Recent Labs    01/01/19 0800 03/05/19 0810 07/23/19 0000  AST 16 17 17   ALT 9 8 9   BILITOT 0.5 0.6 0.7  PROT 6.5 6.6 6.8   Recent Labs    01/01/19 0800 01/01/19 0800 03/05/19 0810 07/23/19 0000 10/12/19 0000  WBC 6.1   < > 6.3 5.6 7.0  NEUTROABS 3,093   < > 3,213 2,699 3,101  HGB 12.8   < > 13.0 12.8 11.2*  HCT 37.8   < > 38.9 38.4 34*  MCV 100.5*  --  100.3* 101.9*  --   PLT 211   < > 195 176 232   < > = values in this interval not displayed.   Lab Results  Component Value Date   TSH 1.54 07/23/2019   No results found for: HGBA1C Lab Results  Component Value Date   CHOL 175 07/23/2019   HDL 54 07/23/2019   LDLCALC 105 (H) 07/23/2019   TRIG 74 07/23/2019   CHOLHDL 3.2 07/23/2019    Significant Diagnostic Results in last 30 days:  No results found.  Assessment/Plan Essential hypertension Blood pressure is controlled, continue Metoprolol, Amlodipine.   PVD (peripheral vascular disease) (HCC) Right lower leg is cool to touch, DP pulse was not felt, no open wounds, minimal edema.   Slow transit constipation Stable, continue Senokot S, Colace, prn DulcoLax.   Osteoarthritis of multiple joints Mostly pain in the medical aspect of the right knee, no apparent redness or swelling or warmth, s/p TKR, continue prn Tylenol, Norco, activity as tolerated.   Insomnia secondary to depression with anxiety Her mood, weight, sleep are stable, continue Escitalopram, Mirtazapine.   RLS (restless legs syndrome) Stable,  continue Requip.      Family/ staff Communication: plan of care reviewed with the patient and charge nurse.   Labs/tests ordered:  none  Time spend 25 minutes.

## 2019-12-22 NOTE — Assessment & Plan Note (Signed)
Stable, continue Senokot S, Colace, prn DulcoLax.

## 2019-12-22 NOTE — Assessment & Plan Note (Signed)
Her mood, weight, sleep are stable, continue Escitalopram, Mirtazapine.

## 2019-12-22 NOTE — Assessment & Plan Note (Signed)
Stable, continue Requip

## 2019-12-22 NOTE — Assessment & Plan Note (Signed)
Blood pressure is controlled, continue Metoprolol, Amlodipine.  

## 2019-12-22 NOTE — Assessment & Plan Note (Signed)
Right lower leg is cool to touch, DP pulse was not felt, no open wounds, minimal edema.

## 2019-12-22 NOTE — Assessment & Plan Note (Signed)
Mostly pain in the medical aspect of the right knee, no apparent redness or swelling or warmth, s/p TKR, continue prn Tylenol, Norco, activity as tolerated.

## 2019-12-25 ENCOUNTER — Non-Acute Institutional Stay: Payer: PPO | Admitting: *Deleted

## 2019-12-25 ENCOUNTER — Other Ambulatory Visit: Payer: Self-pay

## 2019-12-25 DIAGNOSIS — Z515 Encounter for palliative care: Secondary | ICD-10-CM

## 2019-12-28 NOTE — Progress Notes (Signed)
PATIENT NAME: Ann Jensen DOB: [birth year] MRN: [id number]  PRIMARY CARE PROVIDER: Mast, Man X, NP  RESPONSIBLE PARTY: Hani Bialy (son) Acct ID - Guarantor Home Phone Work Phone Relationship Acct Type  [id number] CRICKETT, [phone]  Self P/F     [place], [place], [place] [address]   Due to the COVID-19 crisis, this virtual check-in visit was done via telephone from my office and it was initiated and consent by this patient and or family.  PLAN OF CARE and INTERVENTION:  1. ADVANCE CARE PLANNING/GOALS OF CARE: Goal is for patient to remain at current Skilled facility and avoid hospitalizations.  2. PATIENT/CAREGIVER EDUCATION: Symptom management, safe transfers, edema management 3. DISEASE STATUS: Virtual check-in visit completed via telephone. Patient has not had any recent complaints of pain. Spoke with facility staff nurse, Olegario Shearer, who is able to provide patient update. Patient is alert and oriented x 3. Forgetful at times. She has been calm, without reports of anxiety. She is adjusting well to being on the skilled care unit. No dyspnea noted with conversation or exertion. She continues not to require any use of oxygen. She is currently working with OT for chair positioning. She now has a board placed in her chair to prevent leaning. She is transferred via [place] to her wheelchair and/or recliner. She is non-ambulatory. She requires 1 person assistance with bathing and dressing. She is able to feed herself independently. Her intake is fair. Staff nurse says patient is very "picky" with her food selections. She refuses her Boost drinks regularly. She takes her medications whole with water. No difficulties swallowing reported. Her recent weight is 137.6 lbs. She does have trace bilateral lower extremity edema. She wears compression hose and wraps during the day. She keeps her legs elevated for much of the day. She is intermittently incontinent of bowels. She uses a Purewick  urinary catheter system for urination. Catheter is changed twice daily. No reported skin issues. Will continue to monitor.  HISTORY OF PRESENT ILLNESS:  This is a 86 yo female who resides at [facility] on the skilled care unit. Palliative care continues to follow patient and visits monthly and PRN.  CODE STATUS: DNR ADVANCED DIRECTIVES: N MOST FORM: yes PPS: 30%   (Duration of visit and documentation 30 minutes)   [name], RN BSN

## 2019-12-31 DIAGNOSIS — G2581 Restless legs syndrome: Secondary | ICD-10-CM | POA: Diagnosis not present

## 2019-12-31 DIAGNOSIS — M76819 Anterior tibial syndrome, unspecified leg: Secondary | ICD-10-CM | POA: Diagnosis not present

## 2019-12-31 DIAGNOSIS — M25569 Pain in unspecified knee: Secondary | ICD-10-CM | POA: Diagnosis not present

## 2019-12-31 DIAGNOSIS — Z9181 History of falling: Secondary | ICD-10-CM | POA: Diagnosis not present

## 2019-12-31 DIAGNOSIS — M6281 Muscle weakness (generalized): Secondary | ICD-10-CM | POA: Diagnosis not present

## 2020-01-20 ENCOUNTER — Other Ambulatory Visit: Payer: Self-pay

## 2020-01-20 ENCOUNTER — Non-Acute Institutional Stay: Payer: PPO | Admitting: *Deleted

## 2020-01-20 VITALS — BP 124/66 | HR 68 | Temp 97.8°F | Resp 18 | Wt 125.0 lb

## 2020-01-20 DIAGNOSIS — Z515 Encounter for palliative care: Secondary | ICD-10-CM

## 2020-01-27 NOTE — Progress Notes (Signed)
COMMUNITY PALLIATIVE CARE RN NOTE  PATIENT NAME: Ann Jensen DOB: Apr 10, 1922 MRN: [id number]  PRIMARY CARE PROVIDER: Mast, Man X, NP  RESPONSIBLE PARTY: Ann Jensen (son) Acct ID - Guarantor Home Phone Work Phone Relationship Acct Type  [id number] Ann, [phone]  Self P/F     [place], [place], [place] [address]   Covid-19 Pre-screening Negative  PLAN OF CARE and INTERVENTION:  1. ADVANCE CARE PLANNING/GOALS OF CARE: Goal is for patient to remain at current facility. She has a DNR. 2. PATIENT/CAREGIVER EDUCATION: Symptom management, safe mobility/transfers 3. DISEASE STATUS: Met with patient, both of her sons, Ann Jensen and Ann Jensen, and her daughter in law in her room at the facility. Initially, patient was sitting outside in her wheelchair. She enjoys going outside on nice days. Staff reports that prior to this, she was participating in a facility activity. She is alert and oriented Jensen 3. Forgetful at times. Pleasant mood. She is much more calm and no longer requires the use of Xanax, so it has been discontinued. She has adjusted to this unit well. She denies pain at this time, but does experience pain in her right knee at times with movement from her osteoarthritis. She does have PRN Tylenol and Norco available. No dyspnea noted or reported. She has not required the use of oxygen in several months. She does have some lower extremity edema. She wears compression hose and leg wraps to help. Staff transfers patient via Harrel Lemon lift into her wheelchair or recliner daily. She is able to propel herself in the wheelchair for short distances within the facility. For further distances, staff will transport her. She requires 1 person assistance with bathing, dressing, toileting and transfers. She uses a Purewick urinary system while she is in bed. She also uses a bed pan. She has occasional incontinence of both bowel and bladder. She wears adult briefs. Her intake is normal. She  denies dysphagia. She usually refuses her Boost nutritional supplements. She is taking her medications regularly without difficulties. Chart reviewed. Will continue to monitor.  HISTORY OF PRESENT ILLNESS:  This is a 86 yo female who resides at [facility] on the skilled care unit. She has PMH of OA, anemia, HTN, and osteoporosis. Palliative care team continues to follow patient and visits monthly and PRN.  CODE STATUS: DNR ADVANCED DIRECTIVES: Y MOST FORM: no PPS: 30%   PHYSICAL EXAM:   VITALS: Today's Vitals   01/20/20 1244  BP: 124/66  Pulse: 68  Resp: 18  Temp: 97.8 F (36.6 C)  TempSrc: Temporal  SpO2: 94%  Weight: 125 lb (56.7 kg)  PainSc: 0-No pain    LUNGS: clear to auscultation  CARDIAC: Cor RRR EXTREMITIES: No edema SKIN: Exposed skin is dry and intact  NEURO: Alert and oriented Jensen 3, forgetful, generalized weakness, non-ambulatory   (Duration of visit and documentation 45 minutes)   [name], RN BSN

## 2020-01-28 ENCOUNTER — Encounter: Payer: Self-pay | Admitting: Internal Medicine

## 2020-01-28 ENCOUNTER — Non-Acute Institutional Stay (SKILLED_NURSING_FACILITY): Payer: PPO | Admitting: Internal Medicine

## 2020-01-28 DIAGNOSIS — D649 Anemia, unspecified: Secondary | ICD-10-CM | POA: Diagnosis not present

## 2020-01-28 DIAGNOSIS — F418 Other specified anxiety disorders: Secondary | ICD-10-CM | POA: Diagnosis not present

## 2020-01-28 DIAGNOSIS — I10 Essential (primary) hypertension: Secondary | ICD-10-CM

## 2020-01-28 DIAGNOSIS — G2581 Restless legs syndrome: Secondary | ICD-10-CM

## 2020-01-28 DIAGNOSIS — N1832 Chronic kidney disease, stage 3b: Secondary | ICD-10-CM

## 2020-01-28 NOTE — Progress Notes (Signed)
Location:    [facility] Room Number: 1 Place of Service:  SNF (31) Provider:  Veleta Miners MD   Mast, Man X, NP  Patient Care Team: Mast, Man X, NP as PCP - General (Internal Medicine) Wardell Honour, MD as Attending Physician (Family Medicine)  Extended Emergency Contact Information Primary Emergency Contact: [facility] Address: [place] [address] [place] Phone: [phone] Mobile Phone: [phone] Relation: Son Secondary Emergency Contact: [facility] Address: [address] [place]          [place], [place] [address] [place] Phone: [phone] Relation: Son  Code Status:  DNR Goals of care: Advanced Directive information Advanced Directives 01/28/2020  Does Patient Have a Medical Advance Directive? Yes  Type of Advance Directive Out of facility DNR (pink MOST or yellow form)  Does patient want to make changes to medical advance directive? No - Patient declined  Copy of [place] in Chart? -  Would patient like information on creating a medical advance directive? -  Pre-existing out of facility DNR order (yellow form or pink MOST form) Yellow form placed in chart (order not valid for inpatient use);Pink MOST form placed in chart (order not valid for inpatient use)     Chief Complaint  Patient presents with  . Medical Management of Chronic Issues    HPI:  Pt is a 86 y.o. female seen today for medical management of chronic diseases.   Patient has h/o Hypertension, Anxiety, Restless leg Syndrome, Insomnia, Arthritis and Posterior Tendon Dysfunction with right foot Valgus in Right Knee with inability toAmbulate  Doing well in the facility.  Her only issue is weight loss. On Remeron. Dr Sabra Heck her son did not want me to change the dose. She is aware that her Appetite is not good but she wants to try Su[pplements Other wise doing well . Mood is good. Did not  have any complains. No Nursing issues  Past Medical History:  Diagnosis Date  . Anemia   . Hypertension   . OA (osteoarthritis)   . Osteoporosis   . Renal cyst    Past Surgical History:  Procedure Laterality Date  . APPENDECTOMY    . BLADDER SUSPENSION    . CATARACT EXTRACTION    . CESAREAN SECTION    . REPLACEMENT TOTAL KNEE  1995  . TONSILLECTOMY      No Known Allergies  Allergies as of 01/28/2020   No Known Allergies     Medication List       Accurate as of January 28, 2020 10:12 AM. If you have any questions, ask your nurse or doctor.        acetaminophen 500 MG tablet Commonly known as: TYLENOL Take 1,000 mg by mouth every 8 (eight) hours as needed.   amLODipine 2.5 MG tablet Commonly known as: NORVASC Take 2.5 mg by mouth daily.   aspirin EC 81 MG tablet Take 81 mg by mouth. Once A Morning on Mon, Thu   Biotin 5000 MCG Tabs Take 5,000 mcg by mouth daily.   bisacodyl 10 MG suppository Commonly known as: DULCOLAX Place 10 mg rectally every other day.   docusate sodium 100 MG capsule Commonly known as: COLACE Take 100 mg by mouth daily.   escitalopram 5 MG tablet Commonly known as: LEXAPRO Take 5 mg by mouth every morning.   HYDROcodone-acetaminophen 5-325 MG tablet Commonly known as: NORCO/VICODIN  Take 0.5 tablets by mouth every 6 (six) hours as needed for moderate pain.   lactose free nutrition Liqd Take 237 mLs by mouth 2 (two) times daily between meals.   metoprolol tartrate 50 MG tablet Commonly known as: LOPRESSOR TAKE 1 TABLET BY MOUTH TWICE DAILY.   mirtazapine 7.5 MG tablet Commonly known as: REMERON TAKE ONE TABLET AT BEDTIME.   multivitamin-lutein Caps capsule Take 1 capsule by mouth 2 (two) times daily.   rOPINIRole 1 MG tablet Commonly known as: REQUIP TAKE 1 TABLET EACH EVENING.   sennosides-docusate sodium 8.6-50 MG tablet Commonly known as: SENOKOT-S Take 2 tablets by mouth daily.   Vitamin D 50 MCG (2000 UT)  Caps Take 2,000 Units by mouth daily.   zinc oxide 20 % ointment Apply 1 application topically as needed for irritation.       Review of Systems  Immunization History  Administered Date(s) Administered  . Influenza, High Dose Seasonal PF 07/10/2017, 07/15/2019  . Influenza,inj,Quad PF,6+ Mos 07/03/2018  . Influenza-Unspecified 07/10/2017  . Moderna SARS-COVID-2 Vaccination 10/05/2019, 11/02/2019  . Pneumococcal Conjugate-13 08/18/2014  . Pneumococcal Polysaccharide-23 07/26/2010  . Tdap 11/12/2018   Pertinent  Health Maintenance Due  Topic Date Due  . DEXA SCAN  07/22/2020 (Originally 05/16/1989)  . INFLUENZA VACCINE  05/01/2020  . PNA vac Low Risk Adult  Completed   Fall Risk  08/12/2019 07/23/2019 07/15/2019 04/15/2019 09/02/2018  Falls in the past year? 0 0 0 0 0  Number falls in past yr: 0 0 0 0 0  Injury with Fall? - 0 - 0 0   Functional Status Survey:    Vitals:   01/28/20 1005  BP: (!) 143/84  Pulse: 79  Resp: 16  Temp: (!) 96.3 F (35.7 C)  SpO2: 93%  Weight: 125 lb (56.7 kg)  Height: 5' (1.524 m)   Body mass index is 24.41 kg/m. Physical Exam  Constitutional: Oriented to person, place, and time. Well-developed and well-nourished.  HENT:  Head: Normocephalic.  Mouth/Throat: Oropharynx is clear and moist.  Eyes: Pupils are equal, round, and reactive to light.  Neck: Neck supple.  Cardiovascular: Normal rate and normal heart sounds.  No murmur heard. Pulmonary/Chest: Effort normal and breath sounds normal. No respiratory distress. No wheezes. She has no rales.  Abdominal: Soft. Bowel sounds are normal. No distension. There is no tenderness. There is no rebound.  Musculoskeletal: Mild swelling Bilateral Right more then Left  Lymphadenopathy: none Neurological: Alert and oriented to person, place, and time.  Patient has mild weakness in her Right LE. Left was 4/5  Skin: Skin is warm and dry.  Psychiatric: Normal mood and affect. Behavior is normal.  Thought content normal.    Labs reviewed: Recent Labs    03/05/19 0810 07/23/19 0000 10/12/19 0000  NA 140 143 139  K 3.4* 3.9 4.3  CL 99 102 95*  CO2 31 28 38*  GLUCOSE 84 78  --   BUN 30* 29* 14  CREATININE 1.14* 1.12* 0.8  CALCIUM 9.4 9.4 9.2   Recent Labs    03/05/19 0810 07/23/19 0000  AST 17 17  ALT 8 9  BILITOT 0.6 0.7  PROT 6.6 6.8   Recent Labs    03/05/19 0810 07/23/19 0000 10/12/19 0000  WBC 6.3 5.6 7.0  NEUTROABS 3,213 2,699 3,101  HGB 13.0 12.8 11.2*  HCT 38.9 38.4 34*  MCV 100.3* 101.9*  --   PLT 195 176 232   Lab Results  Component Value Date  TSH 1.54 07/23/2019   No results found for: HGBA1C Lab Results  Component Value Date   CHOL 175 07/23/2019   HDL 54 07/23/2019   LDLCALC 105 (H) 07/23/2019   TRIG 74 07/23/2019   CHOLHDL 3.2 07/23/2019    Significant Diagnostic Results in last 30 days:  No results found.  Assessment/Plan Essential hypertension BP stable on Norvasc and Lopressor  Depression with anxiety with mild weight loss Stable on Remeron and Lexapro Continue to monitor Stage 3b chronic kidney disease Creat Stable   Chronic right-sided low back pain with bilateral sciaticaS/P ? Compression Fractura Neurontin Discontinued due to Nightmares On Norco and Tyelenol Anemia, unspecified type Hgb Stable B12 was normal  Restless Leg On Requip ACP She is under Palliative Care       Family/ staff Communication:   Labs/tests ordered:

## 2020-02-04 ENCOUNTER — Non-Acute Institutional Stay: Payer: PPO | Admitting: *Deleted

## 2020-02-04 VITALS — BP 120/70 | HR 71 | Temp 97.6°F | Resp 17

## 2020-02-04 DIAGNOSIS — Z515 Encounter for palliative care: Secondary | ICD-10-CM

## 2020-02-05 ENCOUNTER — Other Ambulatory Visit: Payer: Self-pay

## 2020-02-11 NOTE — Progress Notes (Signed)
COMMUNITY PALLIATIVE CARE RN NOTE  PATIENT NAME: Ann Jensen DOB: 1921/10/16 MRN: [id number]  PRIMARY CARE PROVIDER: Mast, Man X, NP  RESPONSIBLE PARTY: Alain Honey (son) Acct ID - Guarantor Home Phone Work Phone Relationship Acct Type  [id number] YULIET, [phone]  Self P/F     [address], [place], [place] [address]   Covid-19 Pre-screening Negative  PLAN OF CARE and INTERVENTION:  1. ADVANCE CARE PLANNING/GOALS OF CARE: Goal is for patient to remain at current facility. She has a DNR. 2. PATIENT/CAREGIVER EDUCATION: Symptom management, safe transfers, s/s of infection 3. DISEASE STATUS: Met with patient in her room at the facility. Upon arrival, she is sitting up in her wheelchair awake and alert. Pleasant, calm mood. She denies pain at this time. She is alert and oriented x 3 and able to engage in appropriate conversation. She is forgetful at times. No difficulty breathing noted or reported. She continues to require 1 person assistance with bathing and dressing. She is non-ambulatory and transferred with 2 person assistance via [place]. She is able to propel short distances within the facility in her wheelchair. She is able to feed herself independently. Her intake ranges between 25-100% of meals. She often refuses her Boost drinks. She takes her medications whole with fluids. She denies dysphagia. She is intermittently incontinent of bladder. She uses a Purewick urinary drainage system and wears adult briefs. She uses the bed pan for bowel movements. She does have some trace edema to bilateral lower extremities. She wears compression stockings and leg wraps to help. She does have her legs elevated when sitting up in her recliner during the day. She does participate in facility activities and enjoys going outside when weather is nice. She has adjusted to this unit well. Will continue to monitor.  HISTORY OF PRESENT ILLNESS:  This is a 86 yo female who resides at  [facility] on their skilled unit. Palliative care team continue to visit patient monthly and PRN.  CODE STATUS: DNR  ADVANCED DIRECTIVES: Y MOST FORM: no PPS: 30%   PHYSICAL EXAM:   VITALS: Today's Vitals   02/04/20 1546  BP: 120/70  Pulse: 71  Resp: 17  Temp: 97.6 F (36.4 C)  TempSrc: Temporal  SpO2: 93%  PainSc: 0-No pain    LUNGS: clear to auscultation  CARDIAC: Cor RRR EXTREMITIES: Trace bilateral lower extremity edema SKIN: Exposed skin is dry and intact  NEURO: Alert and oriented x 3, forgetful, HOH, wears glasses, non-ambulatory, transferred via Harrel Lemon lift   (Duration of visit and documentation 30 minutes)   [name], RN BSN

## 2020-02-23 ENCOUNTER — Encounter: Payer: Self-pay | Admitting: Nurse Practitioner

## 2020-02-23 ENCOUNTER — Non-Acute Institutional Stay (SKILLED_NURSING_FACILITY): Payer: PPO | Admitting: Nurse Practitioner

## 2020-02-23 DIAGNOSIS — F418 Other specified anxiety disorders: Secondary | ICD-10-CM

## 2020-02-23 DIAGNOSIS — I10 Essential (primary) hypertension: Secondary | ICD-10-CM

## 2020-02-23 DIAGNOSIS — K5901 Slow transit constipation: Secondary | ICD-10-CM | POA: Diagnosis not present

## 2020-02-23 DIAGNOSIS — G2581 Restless legs syndrome: Secondary | ICD-10-CM | POA: Diagnosis not present

## 2020-02-23 DIAGNOSIS — F5105 Insomnia due to other mental disorder: Secondary | ICD-10-CM

## 2020-02-23 NOTE — Progress Notes (Signed)
Location:    [facility] Room Number: 1 Place of Service:  SNF (31) Provider:  Marlana Latus NP   Mast, Man X, NP  Patient Care Team: Mast, Man X, NP as PCP - General (Internal Medicine) Wardell Honour, MD as Attending Physician (Family Medicine)  Extended Emergency Contact Information Primary Emergency Contact: [facility] Address: [address] [address] [place] Phone: [phone] Mobile Phone: [phone] Relation: Son Secondary Emergency Contact: [facility] Address: [address]          [place], [place] [address] [place] Phone: [phone] Relation: Son  Code Status:  DNR Goals of care: Advanced Directive information Advanced Directives 02/23/2020  Does Patient Have a Medical Advance Directive? Yes  Type of Paramedic of [place];Living will;Out of facility DNR (pink MOST or yellow form)  Does patient want to make changes to medical advance directive? No - Patient declined  Copy of [place] in Chart? No - copy requested  Would patient like information on creating a medical advance directive? -  Pre-existing out of facility DNR order (yellow form or pink MOST form) Yellow form placed in chart (order not valid for inpatient use);Pink MOST form placed in chart (order not valid for inpatient use)     Chief Complaint  Patient presents with  . Medical Management of Chronic Issues    HPI:  Pt is a 86 y.o. female seen today for medical management of chronic diseases.    The patient resides in SNF [facility] for safety, care assistance, ambulates with walker. Hx of HTN, blood pressure is controlled, on Amlodipine 2.5mg  qd, Metoprolol 50mg  bid. Constipation, stable, on Bisacodyl suppository qod, Colace qd, Senokot S II qd.  Her mood is stable, on Escitalopram 5mg  qd, Mirtazapine 7.5mg  qd. RLS, maintained on Ropinirole 1mg  qd   Past Medical  History:  Diagnosis Date  . Anemia   . Hypertension   . OA (osteoarthritis)   . Osteoporosis   . Renal cyst    Past Surgical History:  Procedure Laterality Date  . APPENDECTOMY    . BLADDER SUSPENSION    . CATARACT EXTRACTION    . CESAREAN SECTION    . REPLACEMENT TOTAL KNEE  1995  . TONSILLECTOMY      No Known Allergies  Allergies as of 02/23/2020   No Known Allergies     Medication List       Accurate as of Feb 23, 2020 11:59 PM. If you have any questions, ask your nurse or doctor.        acetaminophen 500 MG tablet Commonly known as: TYLENOL Take 1,000 mg by mouth every 8 (eight) hours as needed.   amLODipine 2.5 MG tablet Commonly known as: NORVASC Take 2.5 mg by mouth daily.   aspirin EC 81 MG tablet Take 81 mg by mouth. Once A Morning on Mon, Thu   Biotin 5000 MCG Tabs Take 5,000 mcg by mouth daily.   bisacodyl 10 MG suppository Commonly known as: DULCOLAX Place 10 mg rectally every other day.   docusate sodium 100 MG capsule Commonly known as: COLACE Take 100 mg by mouth daily.   escitalopram 5 MG tablet Commonly known as: LEXAPRO Take 5 mg by mouth every morning.   HYDROcodone-acetaminophen 5-325 MG tablet Commonly known as: NORCO/VICODIN Take 0.5 tablets by mouth every 6 (six) hours as needed for moderate pain.  lactose free nutrition Liqd Take 237 mLs by mouth 2 (two) times daily between meals.   metoprolol tartrate 50 MG tablet Commonly known as: LOPRESSOR TAKE 1 TABLET BY MOUTH TWICE DAILY.   mirtazapine 7.5 MG tablet Commonly known as: REMERON TAKE ONE TABLET AT BEDTIME.   multivitamin-lutein Caps capsule Take 1 capsule by mouth 2 (two) times daily.   rOPINIRole 1 MG tablet Commonly known as: REQUIP TAKE 1 TABLET EACH EVENING.   sennosides-docusate sodium 8.6-50 MG tablet Commonly known as: SENOKOT-S Take 2 tablets by mouth daily.   Vitamin D 50 MCG (2000 UT) Caps Take 2,000 Units by mouth daily.   zinc oxide 20 %  ointment Apply 1 application topically as needed for irritation.       Review of Systems  Constitutional: Negative for activity change, fever and unexpected weight change.  HENT: Positive for hearing loss. Negative for congestion and voice change.   Eyes: Negative for visual disturbance.  Respiratory: Negative for cough and shortness of breath.   Cardiovascular: Positive for leg swelling. Negative for chest pain and palpitations.  Gastrointestinal: Negative for abdominal pain and constipation.  Genitourinary: Negative for difficulty urinating, dysuria and urgency.  Musculoskeletal: Positive for arthralgias and gait problem.  Skin: Negative for color change.  Neurological: Negative for dizziness, speech difficulty and headaches.  Psychiatric/Behavioral: Negative for behavioral problems and sleep disturbance. The patient is not nervous/anxious.     Immunization History  Administered Date(s) Administered  . Influenza, High Dose Seasonal PF 07/10/2017, 07/15/2019  . Influenza,inj,Quad PF,6+ Mos 07/03/2018  . Influenza-Unspecified 07/10/2017  . Moderna SARS-COVID-2 Vaccination 10/05/2019, 11/02/2019  . Pneumococcal Conjugate-13 08/18/2014  . Pneumococcal Polysaccharide-23 07/26/2010  . Tdap 11/12/2018   Pertinent  Health Maintenance Due  Topic Date Due  . DEXA SCAN  07/22/2020 (Originally 05/16/1989)  . INFLUENZA VACCINE  05/01/2020  . PNA vac Low Risk Adult  Completed   Fall Risk  08/12/2019 07/23/2019 07/15/2019 04/15/2019 09/02/2018  Falls in the past year? 0 0 0 0 0  Number falls in past yr: 0 0 0 0 0  Injury with Fall? - 0 - 0 0   Functional Status Survey:    Vitals:   02/23/20 0907  BP: 115/68  Pulse: 64  Resp: 20  Temp: (!) 97.4 F (36.3 C)  SpO2: 90%  Weight: 129 lb (58.5 kg)  Height: 5' (1.524 m)   Body mass index is 25.19 kg/m. Physical Exam Vitals and nursing note reviewed.  Constitutional:      General: She is not in acute distress.    Appearance:  Normal appearance. She is not ill-appearing.     Comments: Over weight  HENT:     Head: Normocephalic and atraumatic.     Mouth/Throat:     Mouth: Mucous membranes are moist.  Eyes:     Extraocular Movements: Extraocular movements intact.     Conjunctiva/sclera: Conjunctivae normal.     Pupils: Pupils are equal, round, and reactive to light.  Cardiovascular:     Rate and Rhythm: Normal rate and regular rhythm.     Heart sounds: Murmur present.     Comments: The R DP pulse was not felt.  Pulmonary:     Effort: Pulmonary effort is normal.     Breath sounds: Rhonchi present. No rales.  Abdominal:     General: Bowel sounds are normal.     Palpations: Abdomen is soft.     Tenderness: There is no abdominal tenderness.  Musculoskeletal:  Cervical back: Normal range of motion and neck supple.     Right lower leg: Edema present.     Left lower leg: Edema present.     Comments: Trace edema  edema BLE, R>L, right ankle turned outward, chronic right knee pain with ROM, no longer ambulatory or weight bearing. s/p TKR  Skin:    General: Skin is warm and dry.  Neurological:     General: No focal deficit present.     Mental Status: She is alert and oriented to person, place, and time. Mental status is at baseline.     Gait: Gait abnormal.  Psychiatric:        Mood and Affect: Mood normal.        Behavior: Behavior normal.        Thought Content: Thought content normal.     Labs reviewed: Recent Labs    03/05/19 0810 07/23/19 0000 10/12/19 0000  NA 140 143 139  K 3.4* 3.9 4.3  CL 99 102 95*  CO2 31 28 38*  GLUCOSE 84 78  --   BUN 30* 29* 14  CREATININE 1.14* 1.12* 0.8  CALCIUM 9.4 9.4 9.2   Recent Labs    03/05/19 0810 07/23/19 0000  AST 17 17  ALT 8 9  BILITOT 0.6 0.7  PROT 6.6 6.8   Recent Labs    03/05/19 0810 07/23/19 0000 10/12/19 0000  WBC 6.3 5.6 7.0  NEUTROABS 3,213 2,699 3,101  HGB 13.0 12.8 11.2*  HCT 38.9 38.4 34*  MCV 100.3* 101.9*  --   PLT 195  176 232   Lab Results  Component Value Date   TSH 1.54 07/23/2019   No results found for: HGBA1C Lab Results  Component Value Date   CHOL 175 07/23/2019   HDL 54 07/23/2019   LDLCALC 105 (H) 07/23/2019   TRIG 74 07/23/2019   CHOLHDL 3.2 07/23/2019    Significant Diagnostic Results in last 30 days:  No results found.  Assessment/Plan Essential hypertension Blood pressure is controlled, continue Metoprolol, Amlodipine.   Slow transit constipation Stable, continue Senokot S II qd, qod Bisacodyl suppository, daily Colace.   RLS (restless legs syndrome) Stable, continue Ropinirole.  Insomnia secondary to depression with anxiety Her mood is stable, continue Mirtazapine, Escitalopram     Family/ staff Communication: plan of care reviewed with the patient and charge nurse.   Labs/tests ordered:  None  Time spend 25 minutes.

## 2020-02-26 ENCOUNTER — Encounter: Payer: Self-pay | Admitting: Nurse Practitioner

## 2020-02-26 NOTE — Assessment & Plan Note (Signed)
Her mood is stable, continue Mirtazapine, Escitalopram.  

## 2020-02-26 NOTE — Assessment & Plan Note (Signed)
Stable, continue Ropinirole.  

## 2020-02-26 NOTE — Assessment & Plan Note (Signed)
Blood pressure is controlled, continue Metoprolol, Amlodipine.  

## 2020-02-26 NOTE — Assessment & Plan Note (Signed)
Stable, continue Senokot S II qd, qod Bisacodyl suppository, daily Colace.

## 2020-03-08 ENCOUNTER — Non-Acute Institutional Stay: Payer: PPO | Admitting: *Deleted

## 2020-03-08 VITALS — BP 126/72 | HR 67 | Temp 97.6°F | Resp 18 | Wt 126.0 lb

## 2020-03-08 DIAGNOSIS — Z515 Encounter for palliative care: Secondary | ICD-10-CM

## 2020-03-09 ENCOUNTER — Other Ambulatory Visit: Payer: Self-pay

## 2020-03-14 NOTE — Progress Notes (Signed)
COMMUNITY PALLIATIVE CARE RN NOTE  PATIENT NAME: Ann Jensen DOB: 1922/09/24 MRN: [id number]  PRIMARY CARE PROVIDER: Mast, Man X, NP  RESPONSIBLE PARTY: Alain Honey (son) Acct ID - Guarantor Home Phone Work Phone Relationship Acct Type  [id number] QUINTA, [phone]  Self P/F     [address], [address] [place], [place] [address]   Covid-19 Pre-screening Negative  PLAN OF CARE and INTERVENTION:  1. ADVANCE CARE PLANNING/GOALS OF CARE: Goal is for patient to remain at [facility] on the SNF unit. 2. PATIENT/CAREGIVER EDUCATION: Symptom management, safe transfers, s/s of infection 3. DISEASE STATUS: Upon arrival, patient is sitting up in her wheelchair in the dining room on the unit. She is alert and oriented x 3. She is able to engage in appropriate conversation, but is forgetful at times. She denies pain. Respirations are even, regular and unlabored. She requires maximum assistance with bathing, dressing and transfers. She is transferred via [place] as she is non-ambulatory and unable to bear weight. She is able to feed herself independently. Her intake is fair. She is eating 3 meals/day. She often refuses her Boost supplements. She remains able to take her medications whole in applesauce. She uses a Purewick Urinary drainage system. She is intermittently incontinent of bowels and wears adult briefs. She wears compression stockings daily. Trace edema in bilateral lower extremities. She continues to enjoy participating in facility activities and has been adjusting to the skilled unit well. She has a very supportive family. Will continue to monitor.  HISTORY OF PRESENT ILLNESS: This is a 86 yo female with a PMH of CKD stage 3, essential HTN, PVD, hyperlipidemia and anemia. Palliative care team continues to follow patient. Will continue to visit her monthly and PRN.  CODE STATUS: DNR ADVANCED DIRECTIVES: Y MOST FORM: no PPS: 30%   PHYSICAL EXAM:   VITALS: Today's Vitals    03/08/20 1306  BP: 126/72  Pulse: 67  Resp: 18  Temp: 97.6 F (36.4 C)  TempSrc: Temporal  SpO2: 92%  Weight: 126 lb (57.2 kg)  PainSc: 0-No pain    LUNGS: clear to auscultation  CARDIAC: Cor RRR EXTREMITIES: Trace bilateral lower extremity edema SKIN: skin tear to left lower leg-healing  NEURO: Alert and oriented x 3, forgetful, generalized weakness, transfered via Harrel Lemon lift   (Duration of visit and documentation 45 minutes)   [name], RN BSN

## 2020-03-15 ENCOUNTER — Non-Acute Institutional Stay (SKILLED_NURSING_FACILITY): Payer: PPO | Admitting: Nurse Practitioner

## 2020-03-15 ENCOUNTER — Encounter: Payer: Self-pay | Admitting: Nurse Practitioner

## 2020-03-15 DIAGNOSIS — F5105 Insomnia due to other mental disorder: Secondary | ICD-10-CM | POA: Diagnosis not present

## 2020-03-15 DIAGNOSIS — G2581 Restless legs syndrome: Secondary | ICD-10-CM

## 2020-03-15 DIAGNOSIS — D649 Anemia, unspecified: Secondary | ICD-10-CM

## 2020-03-15 DIAGNOSIS — M159 Polyosteoarthritis, unspecified: Secondary | ICD-10-CM

## 2020-03-15 DIAGNOSIS — F418 Other specified anxiety disorders: Secondary | ICD-10-CM

## 2020-03-15 DIAGNOSIS — K5901 Slow transit constipation: Secondary | ICD-10-CM | POA: Diagnosis not present

## 2020-03-15 DIAGNOSIS — I10 Essential (primary) hypertension: Secondary | ICD-10-CM | POA: Diagnosis not present

## 2020-03-15 NOTE — Assessment & Plan Note (Signed)
Hgb 11.2 10/12/19, update CBC/diff.

## 2020-03-15 NOTE — Assessment & Plan Note (Addendum)
Blood pressure is controlled, continue Amlodipine, Metoprolol, ASA 27m qd,  update CMP/eGFR

## 2020-03-15 NOTE — Assessment & Plan Note (Signed)
Stable, continue Escitalopram 5mg  qd, Mirtazapine 7.5mg  qd.

## 2020-03-15 NOTE — Assessment & Plan Note (Signed)
Stable, continue  Bisacodyl suppository qod, Colace qd, Senokot S II qd.   

## 2020-03-15 NOTE — Assessment & Plan Note (Signed)
Multiple sites, mostly left knee

## 2020-03-15 NOTE — Assessment & Plan Note (Signed)
No issues, continue Ropinirole 1mg  qhs.

## 2020-03-15 NOTE — Progress Notes (Signed)
Location:    [facility] Room Number: 1 Place of Service:  SNF (31) Provider: Veleta Miners MD  Mister Krahenbuhl X, NP  Patient Care Team: Jordin Vicencio X, NP as PCP - General (Internal Medicine) Wardell Honour, MD as Attending Physician (Family Medicine)  Extended Emergency Contact Information Primary Emergency Contact: [facility] Address: [place] [address] [place] Phone: [phone] Mobile Phone: [phone] Relation: Son Secondary Emergency Contact: Philley,Garth Address: [address]          [place], [place] [address] [place] Phone: [phone] Relation: Son  Code Status:  DNR Goals of care: Advanced Directive information Advanced Directives 03/15/2020  Does Patient Have a Medical Advance Directive? Yes  Type of Advance Directive Living will;Healthcare Power of [place];Out of facility DNR (pink MOST or yellow form)  Does patient want to make changes to medical advance directive? No - Patient declined  Copy of [place] in Chart? No - copy requested  Would patient like information on creating a medical advance directive? -  Pre-existing out of facility DNR order (yellow form or pink MOST form) Yellow form placed in chart (order not valid for inpatient use);Pink MOST form placed in chart (order not valid for inpatient use)     Chief Complaint  Patient presents with  . Medical Management of Chronic Issues    HPI:  Pt is a 86 y.o. female seen today for medical management of chronic diseases.   Hx of HTN, blood pressure is controlled, taking  Amlodipine 2.19m qd, Metoprolol 544mbid, ASA 8178md. Constipation, stable, on Bisacodyl suppository qod, Colace qd, Senokot S II qd.  Her mood is stable, on Escitalopram 5mg57m, Mirtazapine 7.5mg 67m RLS, maintained on Ropinirole 1mg q11m  Past Medical History:  Diagnosis Date  . Anemia   . Hypertension   . OA  (osteoarthritis)   . Osteoporosis   . Renal cyst    Past Surgical History:  Procedure Laterality Date  . APPENDECTOMY    . BLADDER SUSPENSION    . CATARACT EXTRACTION    . CESAREAN SECTION    . REPLACEMENT TOTAL KNEE  1995  . TONSILLECTOMY      No Known Allergies  Allergies as of 03/15/2020   No Known Allergies     Medication List       Accurate as of March 15, 2020 10:46 AM. If you have any questions, ask your nurse or doctor.        STOP taking these medications   HYDROcodone-acetaminophen 5-325 MG tablet Commonly known as: NORCO/VICODIN Stopped by: Adalea Handler X Iylah Dworkin, NP     TAKE these medications   acetaminophen 500 MG tablet Commonly known as: TYLENOL Take 1,000 mg by mouth every 8 (eight) hours as needed.   amLODipine 2.5 MG tablet Commonly known as: NORVASC Take 2.5 mg by mouth daily.   aspirin EC 81 MG tablet Take 81 mg by mouth. Once A Morning on Mon, Thu   Biotin 5000 MCG Tabs Take 5,000 mcg by mouth daily.   bisacodyl 10 MG suppository Commonly known as: DULCOLAX Place 10 mg rectally every other day.   docusate sodium 100 MG capsule Commonly known as: COLACE Take 100 mg by mouth daily.   escitalopram 5 MG tablet Commonly known as: LEXAPRO Take 5 mg by mouth every morning.   lactose free nutrition Liqd Take 237  mLs by mouth 2 (two) times daily between meals.   metoprolol tartrate 50 MG tablet Commonly known as: LOPRESSOR TAKE 1 TABLET BY MOUTH TWICE DAILY.   mirtazapine 7.5 MG tablet Commonly known as: REMERON TAKE ONE TABLET AT BEDTIME.   multivitamin-lutein Caps capsule Take 1 capsule by mouth 2 (two) times daily.   rOPINIRole 1 MG tablet Commonly known as: REQUIP TAKE 1 TABLET EACH EVENING.   sennosides-docusate sodium 8.6-50 MG tablet Commonly known as: SENOKOT-S Take 2 tablets by mouth daily.   Vitamin D 50 MCG (2000 UT) Caps Take 2,000 Units by mouth daily.   zinc oxide 20 % ointment Apply 1 application topically as needed  for irritation.       Review of Systems  Constitutional: Negative for fatigue, fever and unexpected weight change.  HENT: Positive for hearing loss. Negative for congestion and voice change.   Eyes: Negative for visual disturbance.  Respiratory: Negative for cough.   Cardiovascular: Positive for leg swelling. Negative for chest pain and palpitations.  Gastrointestinal: Negative for constipation, diarrhea, nausea and vomiting.  Genitourinary: Negative for difficulty urinating, dysuria and urgency.       Incontinent of urine.   Musculoskeletal: Positive for arthralgias and gait problem.       Multiple sites, mostly left knee  Skin: Negative for color change.  Neurological: Negative for speech difficulty, weakness and light-headedness.  Psychiatric/Behavioral: Negative for behavioral problems and sleep disturbance.    Immunization History  Administered Date(s) Administered  . Influenza, High Dose Seasonal PF 07/10/2017, 07/15/2019  . Influenza,inj,Quad PF,6+ Mos 07/03/2018  . Influenza-Unspecified 07/10/2017  . Moderna SARS-COVID-2 Vaccination 10/05/2019, 11/02/2019  . Pneumococcal Conjugate-13 08/18/2014  . Pneumococcal Polysaccharide-23 07/26/2010  . Tdap 11/12/2018   Pertinent  Health Maintenance Due  Topic Date Due  . DEXA SCAN  07/22/2020 (Originally 05/16/1989)  . INFLUENZA VACCINE  05/01/2020  . PNA vac Low Risk Adult  Completed   Fall Risk  08/12/2019 07/23/2019 07/15/2019 04/15/2019 09/02/2018  Falls in the past year? 0 0 0 0 0  Number falls in past yr: 0 0 0 0 0  Injury with Fall? - 0 - 0 0   Functional Status Survey:    Vitals:   03/15/20 1004  BP: 114/73  Pulse: 75  Resp: (!) 22  Temp: 98.1 F (36.7 C)  SpO2: 93%  Weight: 126 lb (57.2 kg)  Height: 5' (1.524 m)   Body mass index is 24.61 kg/m. Physical Exam Vitals and nursing note reviewed.  Constitutional:      Appearance: Normal appearance.     Comments: Over weight  HENT:     Head: Normocephalic  and atraumatic.  Eyes:     Extraocular Movements: Extraocular movements intact.     Conjunctiva/sclera: Conjunctivae normal.     Pupils: Pupils are equal, round, and reactive to light.  Cardiovascular:     Rate and Rhythm: Normal rate and regular rhythm.     Heart sounds: Murmur heard.      Comments: The R DP pulse was not felt.  Pulmonary:     Effort: Pulmonary effort is normal.     Breath sounds: No rales.  Abdominal:     General: Bowel sounds are normal.     Palpations: Abdomen is soft.     Tenderness: There is no abdominal tenderness.  Musculoskeletal:     Cervical back: Normal range of motion and neck supple.     Right lower leg: Edema present.     Left lower  leg: Edema present.     Comments: Trace edema  edema BLE, R>L, right ankle turned laterally , chronic right knee pain with ROM, no longer ambulatory or weight bearing. s/p TKR  Skin:    General: Skin is warm and dry.  Neurological:     General: No focal deficit present.     Mental Status: She is alert and oriented to person, place, and time. Mental status is at baseline.     Gait: Gait abnormal.  Psychiatric:        Mood and Affect: Mood normal.        Behavior: Behavior normal.        Thought Content: Thought content normal.     Labs reviewed: Recent Labs    07/23/19 0000 10/12/19 0000  NA 143 139  K 3.9 4.3  CL 102 95*  CO2 28 38*  GLUCOSE 78  --   BUN 29* 14  CREATININE 1.12* 0.8  CALCIUM 9.4 9.2   Recent Labs    07/23/19 0000  AST 17  ALT 9  BILITOT 0.7  PROT 6.8   Recent Labs    07/23/19 0000 10/12/19 0000  WBC 5.6 7.0  NEUTROABS 2,699 3,101  HGB 12.8 11.2*  HCT 38.4 34*  MCV 101.9*  --   PLT 176 232   Lab Results  Component Value Date   TSH 1.54 07/23/2019   No results found for: HGBA1C Lab Results  Component Value Date   CHOL 175 07/23/2019   HDL 54 07/23/2019   LDLCALC 105 (H) 07/23/2019   TRIG 74 07/23/2019   CHOLHDL 3.2 07/23/2019    Significant Diagnostic Results  in last 30 days:  No results found.  Assessment/Plan Essential hypertension Blood pressure is controlled, continue Amlodipine, Metoprolol, ASA 572m qd,  update CMP/eGFR  Slow transit constipation Stable, continue Bisacodyl suppository qod, Colace qd, Senokot S II qd.   Osteoarthritis of multiple joints Multiple sites, mostly left knee  Insomnia secondary to depression with anxiety Stable, continue Escitalopram 557mqd, Mirtazapine 7.72m28md.  RLS (restless legs syndrome) No issues, continue Ropinirole 1mg67ms.   Anemia Hgb 11.2 10/12/19, update CBC/diff.     Family/ staff Communication: plan of care reviewed with the patient and charge nurse.   Labs/tests ordered:  CBC/diff, CMP/eGFR  Time spend 25 minutes.

## 2020-03-17 DIAGNOSIS — R6889 Other general symptoms and signs: Secondary | ICD-10-CM | POA: Diagnosis not present

## 2020-03-17 DIAGNOSIS — R944 Abnormal results of kidney function studies: Secondary | ICD-10-CM | POA: Diagnosis not present

## 2020-03-17 LAB — CBC AND DIFFERENTIAL
HCT: 37 (ref 36–46)
Hemoglobin: 12.5 (ref 12.0–16.0)
Neutrophils Absolute: 1905
Platelets: 180 (ref 150–399)
WBC: 5

## 2020-03-17 LAB — BASIC METABOLIC PANEL
BUN: 22 — AB (ref 4–21)
CO2: 30 — AB (ref 13–22)
Chloride: 105 (ref 99–108)
Creatinine: 0.9 (ref 0.5–1.1)
Glucose: 81
Potassium: 4 (ref 3.4–5.3)
Sodium: 141 (ref 137–147)

## 2020-03-17 LAB — HEPATIC FUNCTION PANEL
ALT: 10 (ref 7–35)
AST: 14 (ref 13–35)
Alkaline Phosphatase: 65 (ref 25–125)
Bilirubin, Total: 0.5

## 2020-03-17 LAB — COMPREHENSIVE METABOLIC PANEL
Albumin: 3.2 — AB (ref 3.5–5.0)
Calcium: 8.8 (ref 8.7–10.7)
Globulin: 2.4

## 2020-03-17 LAB — CBC: RBC: 3.72 — AB (ref 3.87–5.11)

## 2020-03-24 ENCOUNTER — Encounter: Payer: Self-pay | Admitting: Nurse Practitioner

## 2020-03-24 ENCOUNTER — Non-Acute Institutional Stay (SKILLED_NURSING_FACILITY): Payer: PPO | Admitting: Nurse Practitioner

## 2020-03-24 DIAGNOSIS — G2581 Restless legs syndrome: Secondary | ICD-10-CM | POA: Diagnosis not present

## 2020-03-24 DIAGNOSIS — L210 Seborrhea capitis: Secondary | ICD-10-CM

## 2020-03-24 DIAGNOSIS — I10 Essential (primary) hypertension: Secondary | ICD-10-CM | POA: Diagnosis not present

## 2020-03-24 DIAGNOSIS — K5901 Slow transit constipation: Secondary | ICD-10-CM

## 2020-03-24 DIAGNOSIS — F5105 Insomnia due to other mental disorder: Secondary | ICD-10-CM | POA: Diagnosis not present

## 2020-03-24 DIAGNOSIS — F418 Other specified anxiety disorders: Secondary | ICD-10-CM

## 2020-03-24 NOTE — Assessment & Plan Note (Signed)
`  Nizoral shampoo hair 2x/week

## 2020-03-24 NOTE — Progress Notes (Signed)
Location:    [facility] Room Number: 1 Place of Service:  SNF (31) Provider:  Marlana Latus NP   Alishea Beaudin X, NP  Patient Care Team: Dameka Younker X, NP as PCP - General (Internal Medicine) Wardell Honour, MD as Attending Physician (Family Medicine)  Extended Emergency Contact Information Primary Emergency Contact: [facility] Address: [place] [address] [place] Phone: [phone] Mobile Phone: [phone] Relation: Son Secondary Emergency Contact: [facility] Address: [address]          [place], [place] [address] [place] Phone: [phone] Relation: Son  Code Status:  DNR Goals of care: Advanced Directive information Advanced Directives 03/15/2020  Does Patient Have a Medical Advance Directive? Yes  Type of Advance Directive Living will;Healthcare Power of [place];Out of facility DNR (pink MOST or yellow form)  Does patient want to make changes to medical advance directive? No - Patient declined  Copy of [place] in Chart? No - copy requested  Would patient like information on creating a medical advance directive? -  Pre-existing out of facility DNR order (yellow form or pink MOST form) Yellow form placed in chart (order not valid for inpatient use);Pink MOST form placed in chart (order not valid for inpatient use)     Chief Complaint  Patient presents with  . Acute Visit    scaly scalp     HPI:  Pt is a 86 y.o. female seen today for an acute visit for itching scaly scalp  Hx of HTN, blood pressure is controlled, taking  Amlodipine 2.5mg  qd, Metoprolol 50mg  bid, ASA 81mg  qd.   Constipation, stable, on Bisacodyl suppository qod, Colace qd, Senokot S II qd.   Her mood is stable, on Escitalopram 5mg  qd, Mirtazapine 7.5mg  qd.   RLS, maintained on Ropinirole 1mg  qd   Past Medical History:  Diagnosis Date  . Anemia   . Hypertension   . OA  (osteoarthritis)   . Osteoporosis   . Renal cyst    Past Surgical History:  Procedure Laterality Date  . APPENDECTOMY    . BLADDER SUSPENSION    . CATARACT EXTRACTION    . CESAREAN SECTION    . REPLACEMENT TOTAL KNEE  1995  . TONSILLECTOMY      No Known Allergies  Allergies as of 03/24/2020   No Known Allergies     Medication List       Accurate as of March 24, 2020 11:59 PM. If you have any questions, ask your nurse or doctor.        acetaminophen 500 MG tablet Commonly known as: TYLENOL Take 1,000 mg by mouth every 8 (eight) hours as needed.   amLODipine 2.5 MG tablet Commonly known as: NORVASC Take 2.5 mg by mouth daily.   aspirin EC 81 MG tablet Take 81 mg by mouth. Once A Morning on Mon, Thu   Biotin 5000 MCG Tabs Take 5,000 mcg by mouth daily.   bisacodyl 10 MG suppository Commonly known as: DULCOLAX Place 10 mg rectally every other day.   docusate sodium 100 MG capsule Commonly known as: COLACE Take 100 mg by mouth daily.   escitalopram 5 MG tablet Commonly known as: LEXAPRO Take 5 mg by mouth every morning.   lactose free nutrition Liqd Take 237 mLs by mouth 2 (two) times daily between meals.   metoprolol tartrate 50 MG tablet Commonly known as:  LOPRESSOR TAKE 1 TABLET BY MOUTH TWICE DAILY.   mirtazapine 7.5 MG tablet Commonly known as: REMERON TAKE ONE TABLET AT BEDTIME.   multivitamin-lutein Caps capsule Take 1 capsule by mouth 2 (two) times daily.   rOPINIRole 1 MG tablet Commonly known as: REQUIP TAKE 1 TABLET EACH EVENING.   sennosides-docusate sodium 8.6-50 MG tablet Commonly known as: SENOKOT-S Take 2 tablets by mouth daily.   Vitamin D 50 MCG (2000 UT) Caps Take 2,000 Units by mouth daily.   zinc oxide 20 % ointment Apply 1 application topically as needed for irritation.       Review of Systems  Constitutional: Negative for fatigue and fever.  HENT: Positive for hearing loss. Negative for congestion and voice change.    Eyes: Negative for visual disturbance.  Respiratory: Negative for cough.   Cardiovascular: Positive for leg swelling. Negative for chest pain and palpitations.  Gastrointestinal: Negative for constipation.  Genitourinary: Negative for difficulty urinating, dysuria and urgency.       Incontinent of urine.   Musculoskeletal: Positive for arthralgias and gait problem.       Multiple sites, mostly left knee  Skin: Negative for color change.       Itching scalp  Neurological: Negative for speech difficulty, weakness and light-headedness.  Psychiatric/Behavioral: Negative for behavioral problems and sleep disturbance.    Immunization History  Administered Date(s) Administered  . Influenza, High Dose Seasonal PF 07/10/2017, 07/15/2019  . Influenza,inj,Quad PF,6+ Mos 07/03/2018  . Influenza-Unspecified 07/10/2017  . Moderna SARS-COVID-2 Vaccination 10/05/2019, 11/02/2019  . Pneumococcal Conjugate-13 08/18/2014  . Pneumococcal Polysaccharide-23 07/26/2010  . Tdap 11/12/2018   Pertinent  Health Maintenance Due  Topic Date Due  . DEXA SCAN  07/22/2020 (Originally 05/16/1989)  . INFLUENZA VACCINE  05/01/2020  . PNA vac Low Risk Adult  Completed   Fall Risk  08/12/2019 07/23/2019 07/15/2019 04/15/2019 09/02/2018  Falls in the past year? 0 0 0 0 0  Number falls in past yr: 0 0 0 0 0  Injury with Fall? - 0 - 0 0   Functional Status Survey:    Vitals:   03/24/20 1622  BP: 124/76  Pulse: 72  Resp: 18  Temp: (!) 97.3 F (36.3 C)  SpO2: 93%  Weight: 126 lb (57.2 kg)  Height: 5' (1.524 m)   Body mass index is 24.61 kg/m. Physical Exam Vitals and nursing note reviewed.  Constitutional:      Appearance: Normal appearance.     Comments: Over weight  HENT:     Head: Normocephalic and atraumatic.  Eyes:     Extraocular Movements: Extraocular movements intact.     Conjunctiva/sclera: Conjunctivae normal.     Pupils: Pupils are equal, round, and reactive to light.  Cardiovascular:       Rate and Rhythm: Normal rate and regular rhythm.     Heart sounds: Murmur heard.      Comments: The R DP pulse was not felt.  Pulmonary:     Effort: Pulmonary effort is normal.     Breath sounds: No rales.  Abdominal:     General: Bowel sounds are normal.     Palpations: Abdomen is soft.     Tenderness: There is no abdominal tenderness.  Musculoskeletal:     Cervical back: Normal range of motion and neck supple.     Right lower leg: Edema present.     Left lower leg: Edema present.     Comments: Trace edema  edema BLE, R>L, right ankle turned  laterally , chronic right knee pain with ROM, no longer ambulatory or weight bearing. s/p TKR  Skin:    General: Skin is warm and dry.     Comments: Scaly, flaky, itching scalp mostly the right parietal and occipital areas.   Neurological:     General: No focal deficit present.     Mental Status: She is alert and oriented to person, place, and time. Mental status is at baseline.     Gait: Gait abnormal.  Psychiatric:        Mood and Affect: Mood normal.        Behavior: Behavior normal.        Thought Content: Thought content normal.     Labs reviewed: Recent Labs    07/23/19 0000 10/12/19 0000 03/17/20 0000  NA 143 139 141  K 3.9 4.3 4.0  CL 102 95* 105  CO2 28 38* 30*  GLUCOSE 78  --   --   BUN 29* 14 22*  CREATININE 1.12* 0.8 0.9  CALCIUM 9.4 9.2 8.8   Recent Labs    07/23/19 0000 03/17/20 0000  AST 17 14  ALT 9 10  ALKPHOS  --  65  BILITOT 0.7  --   PROT 6.8  --   ALBUMIN  --  3.2*   Recent Labs    07/23/19 0000 10/12/19 0000 03/17/20 0000  WBC 5.6 7.0 5.0  NEUTROABS 2,699 3,101 1,905  HGB 12.8 11.2* 12.5  HCT 38.4 34* 37  MCV 101.9*  --   --   PLT 176 232 180   Lab Results  Component Value Date   TSH 1.54 07/23/2019   No results found for: HGBA1C Lab Results  Component Value Date   CHOL 175 07/23/2019   HDL 54 07/23/2019   LDLCALC 105 (H) 07/23/2019   TRIG 74 07/23/2019   CHOLHDL 3.2  07/23/2019    Significant Diagnostic Results in last 30 days:  No results found.  Assessment/Plan Seborrhea capitis in adult `Nizoral shampoo hair 2x/week  Essential hypertension Blood pressure is controlled, continue Amlodipine, Metoprolol, ASA  Slow transit constipation Stable, continue Bisacodyl suppository qod, Colace qd, Senokot S II qd.   Insomnia secondary to depression with anxiety Her mood is stable, continue Escitalopram, Mirtazapine.   RLS (restless legs syndrome) Stable, continue Ropinirole    Family/ staff Communication: plan of care reviewed with the patient and charge nurse.   Labs/tests ordered:  none  Time spend 25 minutes.

## 2020-03-30 ENCOUNTER — Encounter: Payer: Self-pay | Admitting: Nurse Practitioner

## 2020-03-30 NOTE — Assessment & Plan Note (Signed)
Her mood is stable, continue Escitalopram, Mirtazapine.  

## 2020-03-30 NOTE — Assessment & Plan Note (Signed)
Stable, continue  Bisacodyl suppository qod, Colace qd, Senokot S II qd.   

## 2020-03-30 NOTE — Assessment & Plan Note (Signed)
Blood pressure is controlled, continue Amlodipine, Metoprolol, ASA

## 2020-03-30 NOTE — Assessment & Plan Note (Signed)
Stable, continue Ropinirole.  

## 2020-04-01 ENCOUNTER — Other Ambulatory Visit: Payer: Self-pay

## 2020-04-01 ENCOUNTER — Non-Acute Institutional Stay: Payer: PPO | Admitting: *Deleted

## 2020-04-01 VITALS — BP 120/72 | HR 68 | Temp 97.6°F | Resp 16 | Wt 126.7 lb

## 2020-04-01 DIAGNOSIS — Z515 Encounter for palliative care: Secondary | ICD-10-CM

## 2020-04-11 NOTE — Progress Notes (Signed)
COMMUNITY PALLIATIVE CARE RN NOTE  PATIENT NAME: Ann Jensen DOB: January 11, 1922 MRN: [id number]  PRIMARY CARE PROVIDER: Mast, Man X, NP  RESPONSIBLE PARTY: Alain Honey (son) Acct ID - Guarantor Home Phone Work Phone Relationship Acct Type  [id number] SALAH, [phone]  Self P/F     [place], [place], [place] [address]   Covid-19 Pre-screening Negative  PLAN OF CARE and INTERVENTION:  1. ADVANCE CARE PLANNING/GOALS OF CARE: Goal is for patient to remain on skilled care unit at [facility]. She has a DNR. 2. PATIENT/CAREGIVER EDUCATION: Symptom management, safe mobility/transfers 3. DISEASE STATUS: Met with patient in her room at the facility. She is sitting up in her wheelchair awake and alert. She remains able to engage in appropriate conversation. She is forgetful at times. Respirations are even, regular and unlabored. She denies pain. She requires total assistance with bathing, dressing and transfers. She is transferred via [place] with 2 person assistance. She continues with the [facility] system for urination. She is intermittently incontinent of bowels and wears adult briefs. Her appetite is fair. She is eating 25-100% of meals and feeds herself independently. She takes her medications whole with water. Denies dysphagia. She has a skin tear that is healing to her right lower leg. Compression stockings to manage her bilateral lower extremity edema. She continues to enjoy going outside and participating in facility activities. Will continue to monitor.   HISTORY OF PRESENT ILLNESS: This is a 86 yo female with a PMH of CKD stage 3, essential HTN, PVD, hyperlipidemia and anemia. Palliative care team continues to follow patient. Will continue to visit her monthly and PRN.   CODE STATUS: DNR  ADVANCED DIRECTIVES: Y MOST FORM: no PPS: 30%   PHYSICAL EXAM:   VITALS: Today's Vitals   04/01/20 1136  BP: 120/72  Pulse: 68  Resp: 16  Temp: 97.6 F (36.4 C)   TempSrc: Temporal  SpO2: 94%  PainSc: 0-No pain    LUNGS: clear to auscultation  CARDIAC: Cor RRR} EXTREMITIES: Trace edema to bilateral lower legs SKIN: Healing skin tear to right lower extremity; exposed skin is dry and intact  NEURO: Alert and oriented x 3, forgetful, wears glasses, generalized weakness, non-ambulatory   (Duration of visit and documentation 45 minutes)    [name], RN BSN

## 2020-04-18 IMAGING — CR DG ANKLE COMPLETE 3+V*R*
4 series · 4 of 4 positions shown · non-contrast
Comparison: None.

CLINICAL DATA: Laceration.

EXAM:
RIGHT ANKLE - COMPLETE 3+ VIEW

[x ankle obl right (1 of 2)]
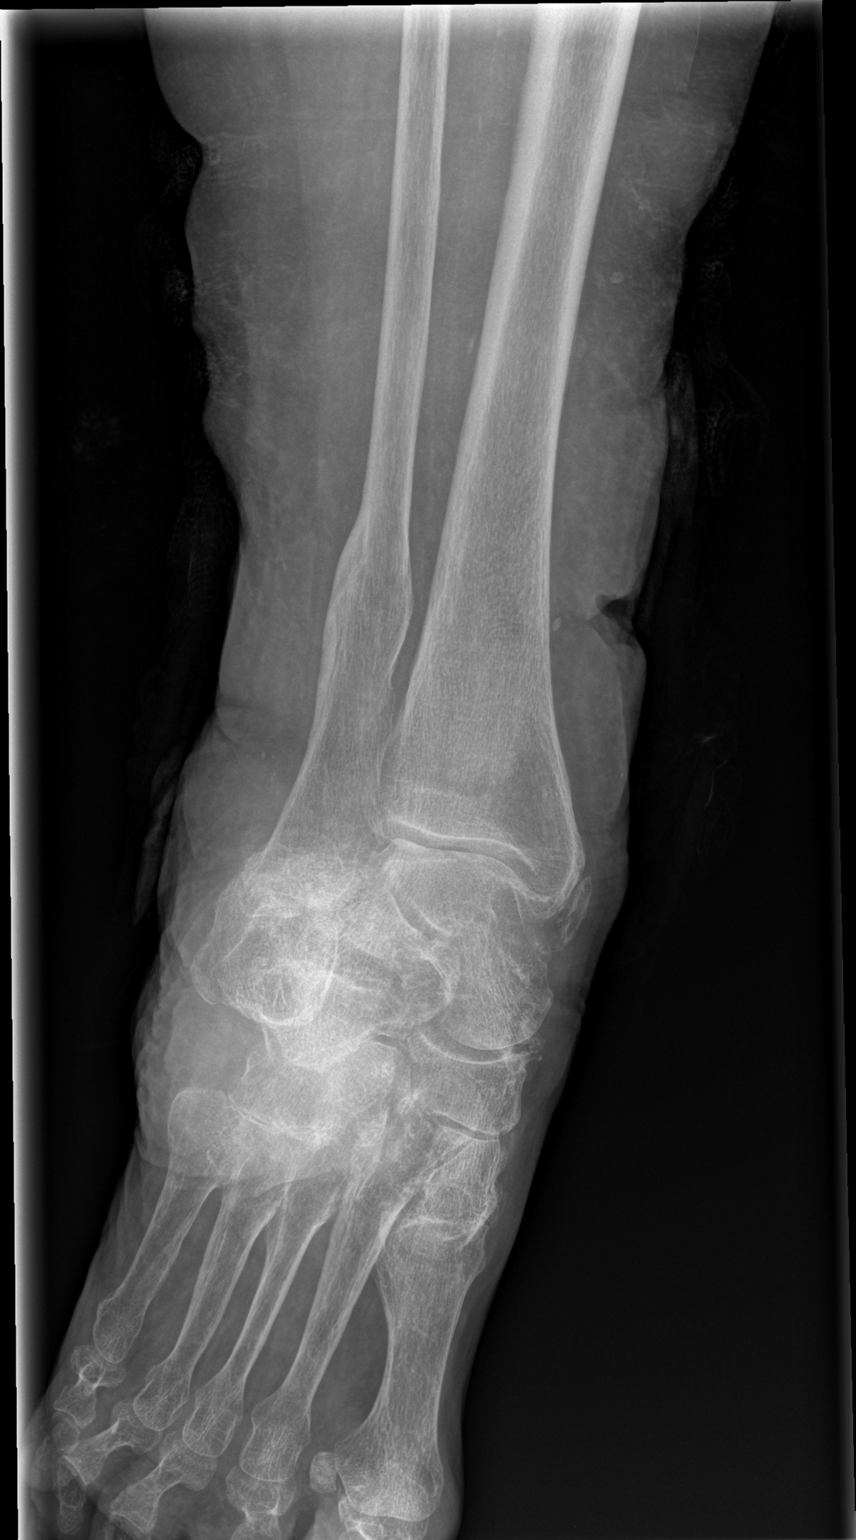

[x ankle lat right]
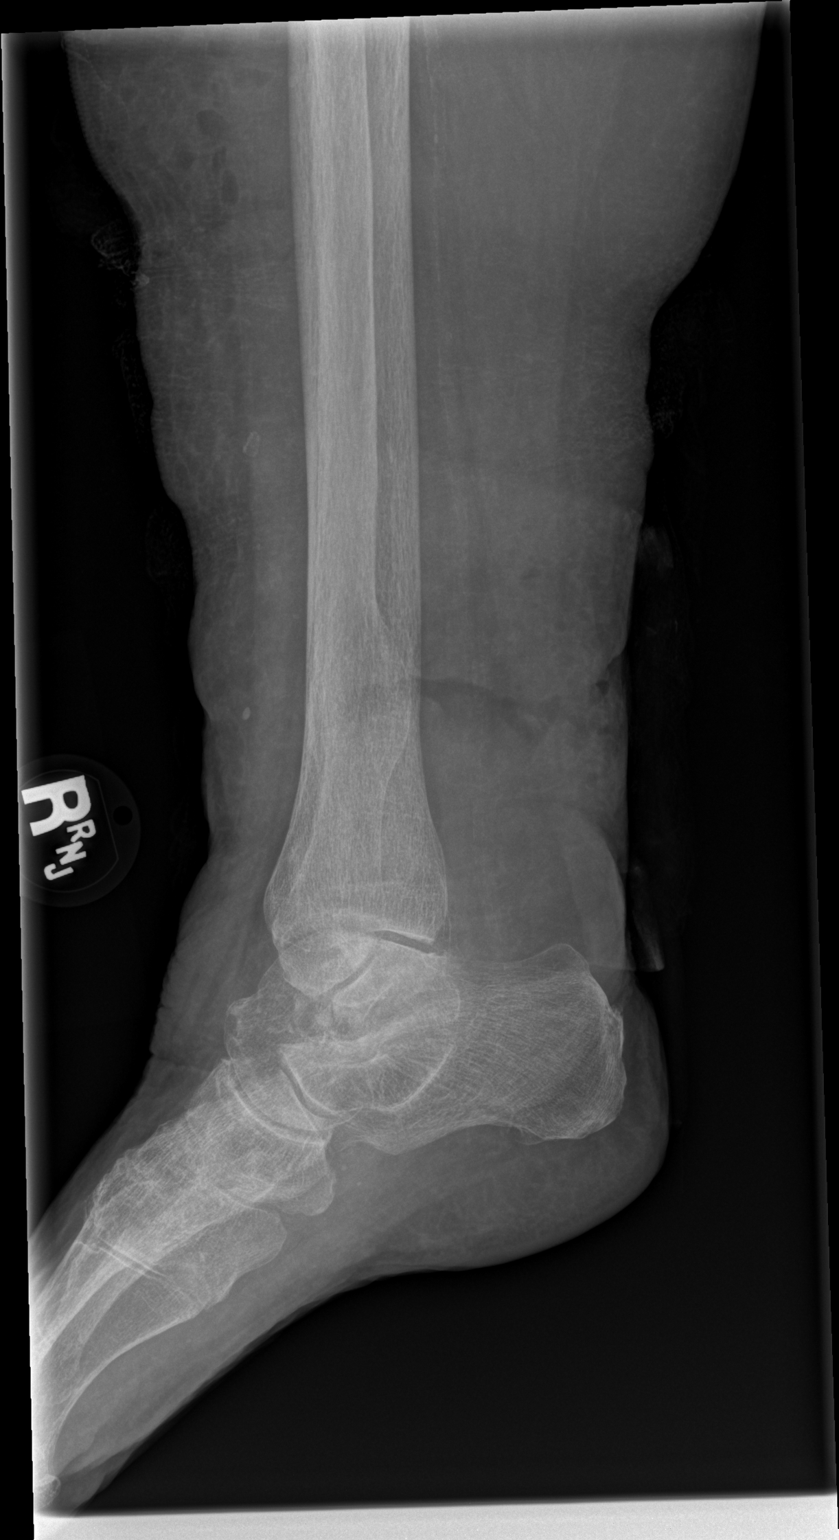

[x ankle ap right]
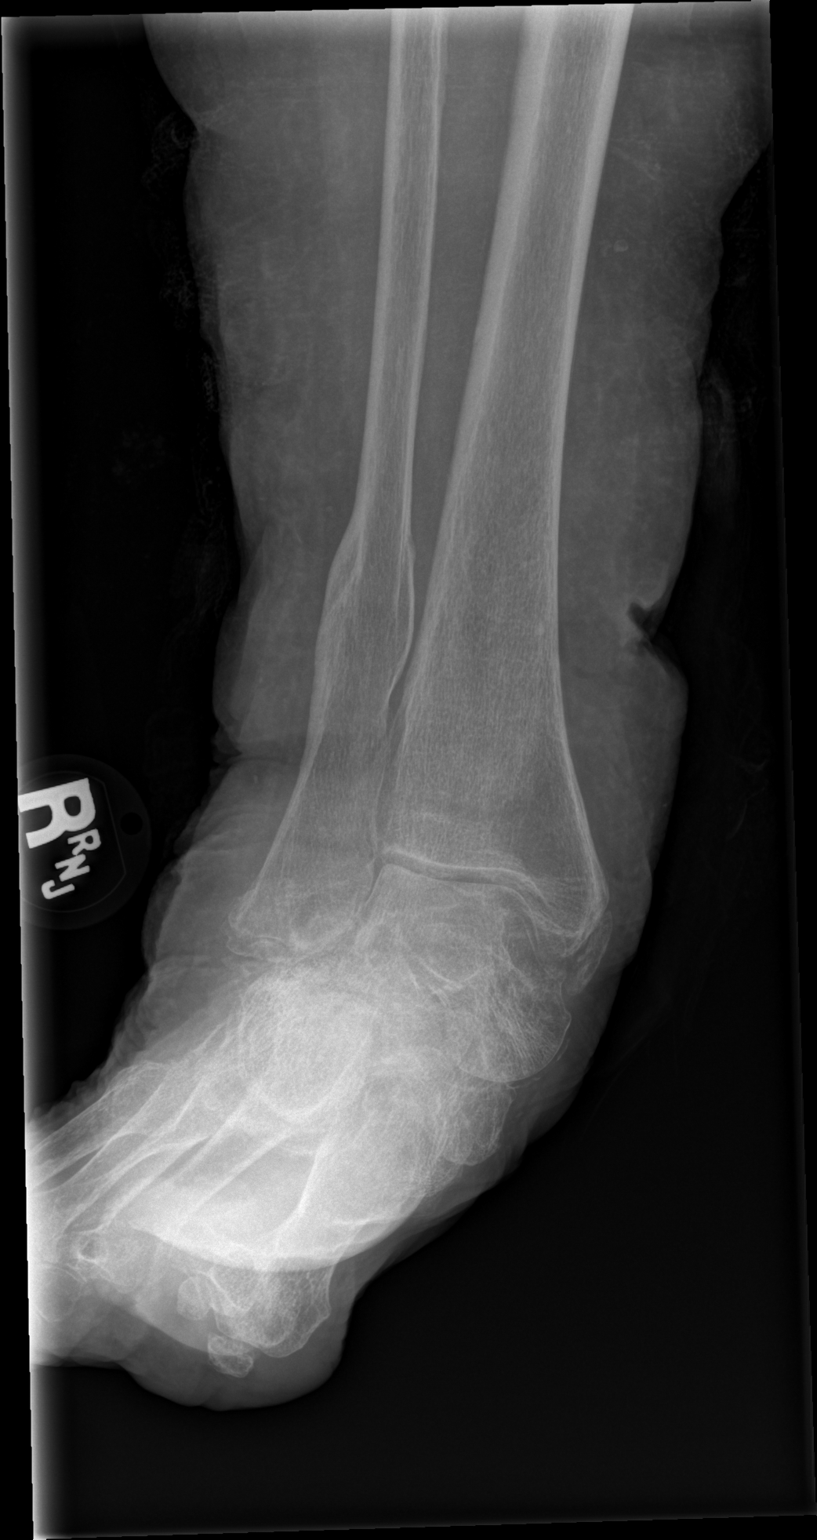

[x ankle obl right (2 of 2)]
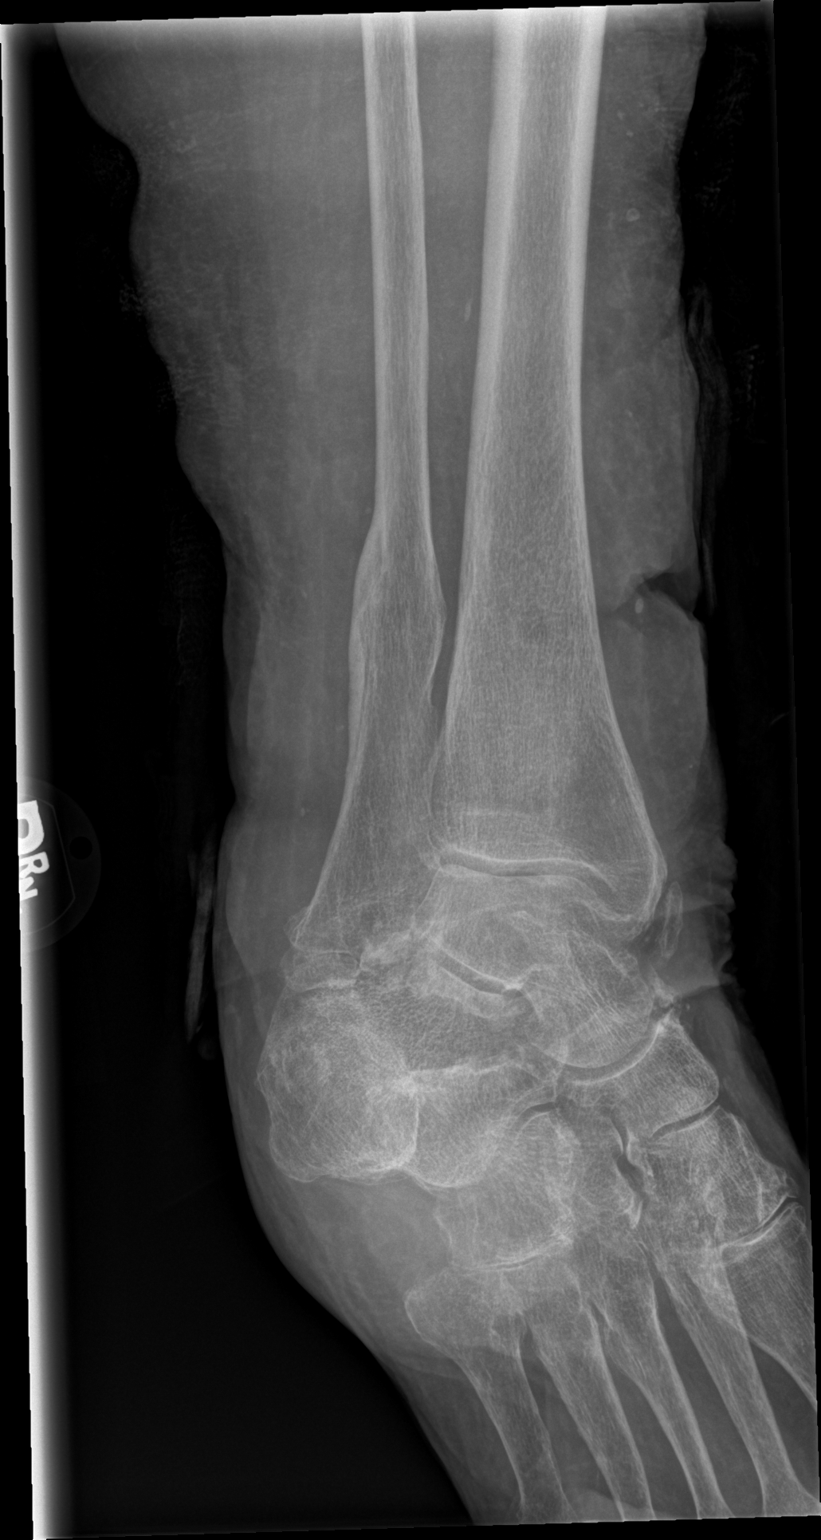

[4 of 4 positions shown; findings below may reference images not displayed]

FINDINGS: There is a soft tissue laceration involving the medial soft tissues
above the level of the ankle. No underlying tibial fracture. Remote
healed distal fibular fracture noted along with probable old
avulsion fractures involving the medial and lateral malleoli.
Moderate ankle and subtalar joint degenerative changes and midfoot
degenerative changes.
IMPRESSION: Remote posttraumatic changes and degenerative changes but no acute
bony findings or radiopaque foreign body.

Large laceration involving the medial soft tissues of the right
lower leg.

## 2020-05-03 ENCOUNTER — Other Ambulatory Visit: Payer: Self-pay

## 2020-05-03 ENCOUNTER — Non-Acute Institutional Stay: Payer: PPO | Admitting: *Deleted

## 2020-05-03 VITALS — BP 131/76 | HR 65 | Temp 97.9°F | Resp 18 | Wt 117.0 lb

## 2020-05-03 DIAGNOSIS — Z515 Encounter for palliative care: Secondary | ICD-10-CM

## 2020-05-04 NOTE — Progress Notes (Signed)
COMMUNITY PALLIATIVE CARE RN NOTE  PATIENT NAME: Ann Jensen DOB: May 09, 1922 MRN: [id number]  PRIMARY CARE PROVIDER: Mast, Man X, NP  RESPONSIBLE PARTY: Ann Jensen (son) Acct ID - Guarantor Home Phone Work Phone Relationship Acct Type  [id number] Ann, [name]  Self P/F     [place], [place], [place] [address]   Covid-19 Pre-screening Negative  PLAN OF CARE and INTERVENTION:  1. ADVANCE CARE PLANNING/GOALS OF CARE: Goal is for patient to remain at current skilled facility [facility]). She has a DNR. 2. PATIENT/CAREGIVER EDUCATION: Symptom management, safe transfers 3. DISEASE STATUS: Met with patient in her room at the facility. She is sitting up in her wheelchair reading a newspaper. Her son bought her some glasses with lights on them in order to see better d/t decreased vision in her right eye. She says that it does help. She denies pain. Respirations are even, regular and unlabored. She requires total assistance with ADLs, except feeding. She is able to wash her face, brush her teeth and operate her tablet. She is alert and oriented Jensen 3. She requires transfers via [place] as she is non-ambulatory. She continues to wear compression stockings for lower extremity edema. OT wrote new order that when patient is out of bed to use both leg rests and to place a black foam support with padded side up on leg rests to better support her legs. Her intake is variable between 26-100%. No issues with swallowing. She takes her medications whole with water. She eats lunch and dinner in the dining room on the unit. She enjoys participating in facility activities and socialization. She also enjoys sitting outside. No new issues or concerns voiced. Chart reviewed. Will continue to monitor.  HISTORY OF PRESENT ILLNESS: This is a 86 yo female with a PMH of CKD stage 3, essential HTN, PVD, hyperlipidemia and anemia. Palliative care team continues to follow patient. Will  continue to visit her monthly and PRN.    CODE STATUS: DNR ADVANCED DIRECTIVES: Y MOST FORM: no PPS: 30%   PHYSICAL EXAM:   VITALS: Today's Vitals   05/03/20 1528  BP: 131/76  Pulse: 65  Resp: 18  Temp: 97.9 F (36.6 C)  TempSrc: Temporal  SpO2: 95%  Weight: 117 lb (53.1 kg)  PainSc: 0-No pain    LUNGS: clear to auscultation  CARDIAC: Cor RRR EXTREMITIES: Trace bilateral lower extremity edema SKIN: Exposed skin is dry and intact  NEURO: Alert and oriented Jensen 3, pleasant mood, generalized weakness, non-ambulatory   (Duration of visit and documentation 45 minutes)   Ann Eastern, RN BSN

## 2020-05-05 ENCOUNTER — Encounter: Payer: Self-pay | Admitting: Internal Medicine

## 2020-05-05 ENCOUNTER — Non-Acute Institutional Stay (SKILLED_NURSING_FACILITY): Payer: PPO | Admitting: Internal Medicine

## 2020-05-05 DIAGNOSIS — F418 Other specified anxiety disorders: Secondary | ICD-10-CM | POA: Diagnosis not present

## 2020-05-05 DIAGNOSIS — I10 Essential (primary) hypertension: Secondary | ICD-10-CM | POA: Diagnosis not present

## 2020-05-05 DIAGNOSIS — N1832 Chronic kidney disease, stage 3b: Secondary | ICD-10-CM

## 2020-05-05 DIAGNOSIS — R269 Unspecified abnormalities of gait and mobility: Secondary | ICD-10-CM

## 2020-05-05 DIAGNOSIS — G2581 Restless legs syndrome: Secondary | ICD-10-CM

## 2020-05-05 NOTE — Progress Notes (Signed)
Location:   [facility] Room Number: 1 Place of Service:  SNF [phone]) Provider:  Veleta Miners, MD  Mast, Man X, NP  Patient Care Team: Mast, Man X, NP as PCP - General (Internal Medicine) Wardell Honour, MD as Attending Physician (Family Medicine)  Extended Emergency Contact Information Primary Emergency Contact: [facility] Address: [place] [address] [place] Phone: [phone] Mobile Phone: [phone] Relation: Son Secondary Emergency Contact: [facility] Address: [address]          [place], [place] [address] [place] Phone: [phone] Relation: Son  Code Status:  DNR Goals of care: Advanced Directive information Advanced Directives 05/05/2020  Does Patient Have a Medical Advance Directive? Yes  Type of Paramedic of [place];Living will;Out of facility DNR (pink MOST or yellow form)  Does patient want to make changes to medical advance directive? No - Patient declined  Copy of [place] in Chart? Yes - validated most recent copy scanned in chart (See row information)  Would patient like information on creating a medical advance directive? -  Pre-existing out of facility DNR order (yellow form or pink MOST form) Yellow form placed in chart (order not valid for inpatient use);Pink MOST form placed in chart (order not valid for inpatient use)     Chief Complaint  Patient presents with  . Medical Management of Chronic Issues    Routine follow up visit  . Best Practice Recommendations    Flu vaccine    HPI:  Pt is a 86 y.o. female seen today for medical management of chronic diseases.    Patient has h/o Hypertension, Anxiety, Restless leg Syndrome, Insomnia, Arthritis and Posterior Tendon Dysfunction with right foot Valgus in Right Knee with inability toAmbulate  Patient is doing well in the facility. Her weight is stable her  mood is staying stable. She is dependent for her transfers.  Unable to go to the bathroom.  That continues to be her main complaint.  Has not had any falls She wanted to know if she can qualify to use higher power chair in the facility  Past Medical History:  Diagnosis Date  . Anemia   . Hypertension   . OA (osteoarthritis)   . Osteoporosis   . Renal cyst    Past Surgical History:  Procedure Laterality Date  . APPENDECTOMY    . BLADDER SUSPENSION    . CATARACT EXTRACTION    . CESAREAN SECTION    . REPLACEMENT TOTAL KNEE  1995  . TONSILLECTOMY      No Known Allergies  Allergies as of 05/05/2020   No Known Allergies     Medication List       Accurate as of May 05, 2020  1:49 PM. If you have any questions, ask your nurse or doctor.        STOP taking these medications   lactose free nutrition Liqd Stopped by: Virgie Dad, MD     TAKE these medications   acetaminophen 500 MG tablet Commonly known as: TYLENOL Take 1,000 mg by mouth every 8 (eight) hours as needed.   amLODipine 2.5 MG tablet Commonly known as: NORVASC Take 2.5 mg by mouth daily.   aspirin EC 81 MG tablet Take 81 mg by mouth. Once A Morning on Mon, Thu   Biotin 5000 MCG Tabs Take 5,000 mcg by mouth daily.  bisacodyl 10 MG suppository Commonly known as: DULCOLAX Place 10 mg rectally every other day.   docusate sodium 100 MG capsule Commonly known as: COLACE Take 100 mg by mouth daily.   escitalopram 5 MG tablet Commonly known as: LEXAPRO Take 5 mg by mouth every morning.   metoprolol tartrate 50 MG tablet Commonly known as: LOPRESSOR TAKE 1 TABLET BY MOUTH TWICE DAILY.   mirtazapine 7.5 MG tablet Commonly known as: REMERON TAKE ONE TABLET AT BEDTIME.   multivitamin-lutein Caps capsule Take 1 capsule by mouth 2 (two) times daily.   rOPINIRole 1 MG tablet Commonly known as: REQUIP TAKE 1 TABLET EACH EVENING.   sennosides-docusate sodium 8.6-50 MG tablet Commonly known as:  SENOKOT-S Take 2 tablets by mouth daily.   Vitamin D 50 MCG (2000 UT) Caps Take 2,000 Units by mouth daily.   zinc oxide 20 % ointment Apply 1 application topically as needed for irritation.       Review of Systems  Review of Systems  Constitutional: Negative for activity change, appetite change, chills, diaphoresis, fatigue and fever.  HENT: Negative for mouth sores, postnasal drip, rhinorrhea, sinus pain and sore throat.   Respiratory: Negative for apnea, cough, chest tightness, shortness of breath and wheezing.   Cardiovascular: Negative for chest pain, palpitations and leg swelling.  Gastrointestinal: Negative for abdominal distention, abdominal pain, constipation, diarrhea, nausea and vomiting.  Genitourinary: Negative for dysuria and frequency.  Musculoskeletal: Negative for arthralgias, joint swelling and myalgias.  Skin: Negative for rash.  Neurological: Negative for dizziness, syncope, weakness, light-headedness and numbness.  Psychiatric/Behavioral: Negative for behavioral problems, confusion and sleep disturbance.     Immunization History  Administered Date(s) Administered  . Influenza, High Dose Seasonal PF 07/10/2017, 07/15/2019  . Influenza,inj,Quad PF,6+ Mos 07/03/2018  . Influenza-Unspecified 07/10/2017  . Moderna SARS-COVID-2 Vaccination 10/05/2019, 11/02/2019  . Pneumococcal Conjugate-13 08/18/2014  . Pneumococcal Polysaccharide-23 07/26/2010  . Tdap 11/12/2018   Pertinent  Health Maintenance Due  Topic Date Due  . INFLUENZA VACCINE  05/01/2020  . DEXA SCAN  07/22/2020 (Originally 05/16/1989)  . PNA vac Low Risk Adult  Completed   Fall Risk  08/12/2019 07/23/2019 07/15/2019 04/15/2019 09/02/2018  Falls in the past year? 0 0 0 0 0  Number falls in past yr: 0 0 0 0 0  Injury with Fall? - 0 - 0 0   Functional Status Survey:    Vitals:   05/05/20 1334  BP: 105/82  Pulse: 70  Resp: 20  Temp: (!) 97.3 F (36.3 C)  SpO2: 94%  Weight: 126 lb 11.2 oz  (57.5 kg)  Height: 5' (1.524 m)   Body mass index is 24.74 kg/m. Physical Exam  Constitutional: Oriented to person, place, and time. Well-developed and well-nourished.  HENT:  Head: Normocephalic.  Mouth/Throat: Oropharynx is clear and moist.  Eyes: Pupils are equal, round, and reactive to light.  Neck: Neck supple.  Cardiovascular: Normal rate and normal heart sounds.  No murmur heard. Pulmonary/Chest: Effort normal and breath sounds normal. No respiratory distress. No wheezes. She has no rales.  Abdominal: Soft. Bowel sounds are normal. No distension. There is no tenderness. There is no rebound.  Musculoskeletal: Mild swelling Bilateral Right more then Left Lymphadenopathy: none Neurological: Alert and oriented to person, place, and time.  Skin: Skin is warm and dry.  Psychiatric: Normal mood and affect. Behavior is normal. Thought content normal.    Labs reviewed: Recent Labs    07/23/19 0000 10/12/19 0000 03/17/20 0000  NA 143 139  141  K 3.9 4.3 4.0  CL 102 95* 105  CO2 28 38* 30*  GLUCOSE 78  --   --   BUN 29* 14 22*  CREATININE 1.12* 0.8 0.9  CALCIUM 9.4 9.2 8.8   Recent Labs    07/23/19 0000 03/17/20 0000  AST 17 14  ALT 9 10  ALKPHOS  --  65  BILITOT 0.7  --   PROT 6.8  --   ALBUMIN  --  3.2*   Recent Labs    07/23/19 0000 10/12/19 0000 03/17/20 0000  WBC 5.6 7.0 5.0  NEUTROABS 2,699 3,101 1,905  HGB 12.8 11.2* 12.5  HCT 38.4 34* 37  MCV 101.9*  --   --   PLT 176 232 180   Lab Results  Component Value Date   TSH 1.54 07/23/2019   No results found for: HGBA1C Lab Results  Component Value Date   CHOL 175 07/23/2019   HDL 54 07/23/2019   LDLCALC 105 (H) 07/23/2019   TRIG 74 07/23/2019   CHOLHDL 3.2 07/23/2019    Significant Diagnostic Results in last 30 days:  No results found.  Assessment/Plan Essential hypertension Stable on Norvasc and Metoprolol  Depression with anxiety with mild weight loss Weight stable on Remeron and  Lexapro Continue to monitor Stage 3b chronic kidney disease Creat stable  Chronic right-sided low back pain with bilateral sciatica Now on Tylenol Only No Neurontin due to Night mares Therapy to evaluate for Power chair  Anemia, unspecified type Hgb Stable B12 was normal  Restless Leg On Requip ACP She is under Palliative Care   Family/ staff Communication:   Labs/tests ordered:

## 2020-05-09 DIAGNOSIS — R293 Abnormal posture: Secondary | ICD-10-CM | POA: Diagnosis not present

## 2020-05-09 DIAGNOSIS — G2581 Restless legs syndrome: Secondary | ICD-10-CM | POA: Diagnosis not present

## 2020-05-09 DIAGNOSIS — M76819 Anterior tibial syndrome, unspecified leg: Secondary | ICD-10-CM | POA: Diagnosis not present

## 2020-05-09 DIAGNOSIS — G4700 Insomnia, unspecified: Secondary | ICD-10-CM | POA: Diagnosis not present

## 2020-05-09 DIAGNOSIS — M6281 Muscle weakness (generalized): Secondary | ICD-10-CM | POA: Diagnosis not present

## 2020-05-09 DIAGNOSIS — Z9181 History of falling: Secondary | ICD-10-CM | POA: Diagnosis not present

## 2020-05-23 ENCOUNTER — Non-Acute Institutional Stay (SKILLED_NURSING_FACILITY): Payer: PPO | Admitting: Nurse Practitioner

## 2020-05-23 ENCOUNTER — Encounter: Payer: Self-pay | Admitting: Nurse Practitioner

## 2020-05-23 DIAGNOSIS — R300 Dysuria: Secondary | ICD-10-CM | POA: Diagnosis not present

## 2020-05-23 DIAGNOSIS — K5901 Slow transit constipation: Secondary | ICD-10-CM

## 2020-05-23 DIAGNOSIS — G2581 Restless legs syndrome: Secondary | ICD-10-CM | POA: Diagnosis not present

## 2020-05-23 DIAGNOSIS — I10 Essential (primary) hypertension: Secondary | ICD-10-CM

## 2020-05-23 DIAGNOSIS — F5105 Insomnia due to other mental disorder: Secondary | ICD-10-CM

## 2020-05-23 DIAGNOSIS — F418 Other specified anxiety disorders: Secondary | ICD-10-CM

## 2020-05-23 NOTE — Assessment & Plan Note (Signed)
Stable, continue Ropinirole.

## 2020-05-23 NOTE — Assessment & Plan Note (Signed)
Her mood is stable, continue Escitalopram

## 2020-05-23 NOTE — Assessment & Plan Note (Signed)
stable, on Bisacodyl suppository qod, Colace qd, Senokot S II qd.  

## 2020-05-23 NOTE — Assessment & Plan Note (Signed)
blood pressure is controlled,takingAmlodipine 2.5mg  qd, Metoprolol 50mg  bid, ASA 81mg  qd.

## 2020-05-23 NOTE — Assessment & Plan Note (Signed)
c/o urinary urgency, burning sensation upon urination. Denied abd pain, nausea, vomiting, she is afebrile Obtain UA C/S to r/i UTI

## 2020-05-23 NOTE — Progress Notes (Signed)
Location:   [facility] Room Number: 1-A Place of Service:  SNF (31) Provider: Marlana Latus NP    Patient Care Team: Chamille Werntz X, NP as PCP - General (Internal Medicine) Wardell Honour, MD as Attending Physician (Family Medicine)  Extended Emergency Contact Information Primary Emergency Contact: [facility] Address: [address] [address] [place] Phone: [phone] Mobile Phone: [phone] Relation: Son Secondary Emergency Contact: [facility] Address: [address]          [place], [place] [address] [place] Phone: [phone] Relation: Son  Code Status:  DNR Goals of care: Advanced Directive information Advanced Directives 05/23/2020  Does Patient Have a Medical Advance Directive? Yes  Type of Advance Directive Out of facility DNR (pink MOST or yellow form);Living will;Healthcare Power of Attorney  Does patient want to make changes to medical advance directive? No - Patient declined  Copy of [place] in Chart? No - copy requested  Would patient like information on creating a medical advance directive? -  Pre-existing out of facility DNR order (yellow form or pink MOST form) -     Chief Complaint  Patient presents with  . Acute Visit    Burning sensation upon urinating, and urinary urgency.    HPI:  Pt is a 86 y.o. female seen today for an acute visit for c/o urinary urgency, burning sensation upon urination. Denied abd pain, nausea, vomiting, she is afebrile  Hx of HTN, blood pressure is controlled,takingAmlodipine 2.5mg  qd, Metoprolol 50mg  bid, ASA 81mg  qd.             Constipation, stable, on Bisacodyl suppository qod, Colace qd, Senokot S II qd.              Her mood is stable, on Escitalopram 5mg  qd, Mirtazapine 7.5mg  qd.              RLS, maintained on Ropinirole 1mg  qd      Past Medical History:  Diagnosis Date  . Anemia   . Hypertension    . OA (osteoarthritis)   . Osteoporosis   . Renal cyst    Past Surgical History:  Procedure Laterality Date  . APPENDECTOMY    . BLADDER SUSPENSION    . CATARACT EXTRACTION    . CESAREAN SECTION    . REPLACEMENT TOTAL KNEE  1995  . TONSILLECTOMY      No Known Allergies  Allergies as of 05/23/2020   No Known Allergies     Medication List       Accurate as of May 23, 2020 11:59 PM. If you have any questions, ask your nurse or doctor.        acetaminophen 500 MG tablet Commonly known as: TYLENOL Take 1,000 mg by mouth every 8 (eight) hours as needed.   amLODipine 2.5 MG tablet Commonly known as: NORVASC Take 2.5 mg by mouth daily.   aspirin EC 81 MG tablet Take 81 mg by mouth. Once A Morning on Mon, Thu   Biotin 5000 MCG Tabs Take 5,000 mcg by mouth daily.   bisacodyl 10 MG suppository Commonly known as: DULCOLAX Place 10 mg rectally every other day.   docusate sodium 100 MG capsule Commonly known as: COLACE Take 100 mg by mouth daily.   escitalopram 5 MG tablet Commonly known as: LEXAPRO Take 5 mg by mouth every morning.   KETOCONAZOLE (TOPICAL) 1 %  Sham Apply 1 application topically.   metoprolol tartrate 50 MG tablet Commonly known as: LOPRESSOR TAKE 1 TABLET BY MOUTH TWICE DAILY.   mirtazapine 7.5 MG tablet Commonly known as: REMERON TAKE ONE TABLET AT BEDTIME.   multivitamin-lutein Caps capsule Take 1 capsule by mouth 2 (two) times daily.   rOPINIRole 1 MG tablet Commonly known as: REQUIP TAKE 1 TABLET EACH EVENING.   sennosides-docusate sodium 8.6-50 MG tablet Commonly known as: SENOKOT-S Take 2 tablets by mouth daily.   Vitamin D 50 MCG (2000 UT) Caps Take 2,000 Units by mouth daily.   zinc oxide 20 % ointment Apply 1 application topically as needed for irritation.       Review of Systems  Constitutional: Negative for activity change, appetite change and fever.  HENT: Positive for hearing loss. Negative for congestion and  voice change.   Eyes: Negative for visual disturbance.  Respiratory: Negative for cough.   Cardiovascular: Positive for leg swelling. Negative for chest pain and palpitations.  Gastrointestinal: Negative for abdominal pain, constipation, nausea and vomiting.  Genitourinary: Positive for dysuria and urgency. Negative for difficulty urinating and hematuria.       Incontinent of urine.   Musculoskeletal: Positive for arthralgias and gait problem.       Multiple sites, mostly left knee  Skin: Negative for color change.       Itching scalp  Neurological: Negative for dizziness, speech difficulty and weakness.  Psychiatric/Behavioral: Negative for behavioral problems and sleep disturbance.    Immunization History  Administered Date(s) Administered  . Influenza, High Dose Seasonal PF 07/10/2017, 07/15/2019  . Influenza,inj,Quad PF,6+ Mos 07/03/2018  . Influenza-Unspecified 07/10/2017  . Moderna SARS-COVID-2 Vaccination 10/05/2019, 11/02/2019  . Pneumococcal Conjugate-13 08/18/2014  . Pneumococcal Polysaccharide-23 07/26/2010  . Tdap 11/12/2018   Pertinent  Health Maintenance Due  Topic Date Due  . INFLUENZA VACCINE  05/01/2020  . DEXA SCAN  07/22/2020 (Originally [birth year]16/1989)  . PNA vac Low Risk Adult  Completed   Fall Risk  08/12/2019 07/23/2019 07/15/2019 04/15/2019 09/02/2018  Falls in the past year? 0 0 0 0 0  Number falls in past yr: 0 0 0 0 0  Injury with Fall? - 0 - 0 0   Functional Status Survey:    Vitals:   05/23/20 1451  BP: 108/78  Pulse: 86  Resp: 19  Temp: 97.6 F (36.4 C)  SpO2: 96%  Weight: 127 lb 12.8 oz (58 kg)  Height: 5' (1.524 m)   Body mass index is 24.96 kg/m. Physical Exam Vitals and nursing note reviewed.  Constitutional:      Appearance: Normal appearance.  HENT:     Head: Normocephalic and atraumatic.     Mouth/Throat:     Mouth: Mucous membranes are moist.  Eyes:     Extraocular Movements: Extraocular movements intact.      Conjunctiva/sclera: Conjunctivae normal.     Pupils: Pupils are equal, round, and reactive to light.  Cardiovascular:     Rate and Rhythm: Normal rate and regular rhythm.     Heart sounds: Murmur heard.      Comments: The R DP pulse was not felt.  Pulmonary:     Effort: Pulmonary effort is normal.     Breath sounds: No rales.  Abdominal:     General: Bowel sounds are normal. There is no distension.     Palpations: Abdomen is soft.     Tenderness: There is no abdominal tenderness. There is no right CVA tenderness, left CVA tenderness,  guarding or rebound.  Musculoskeletal:     Cervical back: Normal range of motion and neck supple.     Right lower leg: Edema present.     Left lower leg: Edema present.     Comments: Trace edema  edema BLE, R>L, right ankle turned laterally , chronic right knee pain with ROM, no longer ambulatory or weight bearing. s/p TKR  Skin:    General: Skin is warm and dry.     Comments: Scaly, flaky, itching scalp mostly the right parietal and occipital areas.   Neurological:     General: No focal deficit present.     Mental Status: She is alert and oriented to person, place, and time. Mental status is at baseline.     Gait: Gait abnormal.  Psychiatric:        Mood and Affect: Mood normal.        Behavior: Behavior normal.        Thought Content: Thought content normal.     Labs reviewed: Recent Labs    07/23/19 0000 10/12/19 0000 03/17/20 0000  NA 143 139 141  K 3.9 4.3 4.0  CL 102 95* 105  CO2 28 38* 30*  GLUCOSE 78  --   --   BUN 29* 14 22*  CREATININE 1.12* 0.8 0.9  CALCIUM 9.4 9.2 8.8   Recent Labs    07/23/19 0000 03/17/20 0000  AST 17 14  ALT 9 10  ALKPHOS  --  65  BILITOT 0.7  --   PROT 6.8  --   ALBUMIN  --  3.2*   Recent Labs    07/23/19 0000 10/12/19 0000 03/17/20 0000  WBC 5.6 7.0 5.0  NEUTROABS 2,699 3,101 1,905  HGB 12.8 11.2* 12.5  HCT 38.4 34* 37  MCV 101.9*  --   --   PLT 176 232 180   Lab Results  Component  Value Date   TSH 1.54 07/23/2019   No results found for: HGBA1C Lab Results  Component Value Date   CHOL 175 07/23/2019   HDL 54 07/23/2019   LDLCALC 105 (H) 07/23/2019   TRIG 74 07/23/2019   CHOLHDL 3.2 07/23/2019    Significant Diagnostic Results in last 30 days:  No results found.  Assessment/Plan Dysuria c/o urinary urgency, burning sensation upon urination. Denied abd pain, nausea, vomiting, she is afebrile Obtain UA C/S to r/i UTI  Essential hypertension  blood pressure is controlled,takingAmlodipine 2.5mg  qd, Metoprolol 50mg  bid, ASA 81mg  qd.   Slow transit constipation stable, on Bisacodyl suppository qod, Colace qd, Senokot S II qd.    RLS (restless legs syndrome) Stable, continue Ropinirole.   Insomnia secondary to depression with anxiety Her mood is stable, continue Escitalopram    Family/ staff Communication: plan of care reviewed with the patient and charge nurse.   Labs/tests ordered:  UA C/S  Time spend 25 minutes.

## 2020-05-24 ENCOUNTER — Encounter: Payer: Self-pay | Admitting: Internal Medicine

## 2020-05-24 ENCOUNTER — Encounter: Payer: Self-pay | Admitting: Nurse Practitioner

## 2020-05-24 DIAGNOSIS — R4182 Altered mental status, unspecified: Secondary | ICD-10-CM | POA: Diagnosis not present

## 2020-06-03 ENCOUNTER — Non-Acute Institutional Stay (SKILLED_NURSING_FACILITY): Payer: PPO | Admitting: Nurse Practitioner

## 2020-06-03 ENCOUNTER — Encounter: Payer: Self-pay | Admitting: Nurse Practitioner

## 2020-06-03 DIAGNOSIS — G2581 Restless legs syndrome: Secondary | ICD-10-CM

## 2020-06-03 DIAGNOSIS — K5901 Slow transit constipation: Secondary | ICD-10-CM | POA: Diagnosis not present

## 2020-06-03 DIAGNOSIS — L210 Seborrhea capitis: Secondary | ICD-10-CM

## 2020-06-03 DIAGNOSIS — M159 Polyosteoarthritis, unspecified: Secondary | ICD-10-CM

## 2020-06-03 DIAGNOSIS — I10 Essential (primary) hypertension: Secondary | ICD-10-CM | POA: Diagnosis not present

## 2020-06-03 DIAGNOSIS — F5105 Insomnia due to other mental disorder: Secondary | ICD-10-CM

## 2020-06-03 DIAGNOSIS — F418 Other specified anxiety disorders: Secondary | ICD-10-CM

## 2020-06-03 NOTE — Assessment & Plan Note (Signed)
Better on Nizoral shampoo, 2 scabbed over areas on top of head, ? SK

## 2020-06-03 NOTE — Assessment & Plan Note (Signed)
chronic right knee pain, right ankle turned lateral deformity, no ambulatory, mechanical lift for transfer, w/c for mobility.  

## 2020-06-03 NOTE — Assessment & Plan Note (Signed)
blood pressure is controlled,continue Amlodipine 2.5mg  qd, Metoprolol 50mg  bid, ASA 81mg  qd

## 2020-06-03 NOTE — Assessment & Plan Note (Signed)
Stable, continue  Bisacodyl suppository qod, Colace qd, Senokot S II qd.

## 2020-06-03 NOTE — Assessment & Plan Note (Signed)
Stable, continue Ropinirole.

## 2020-06-03 NOTE — Progress Notes (Signed)
Location:   [facility] Room Number: 1 Place of Service:  SNF (31) Provider:  Flavia Bruss, Lennie Odor, NP  Maude Hettich X, NP  Patient Care Team: Camrin Lapre X, NP as PCP - General (Internal Medicine) Wardell Honour, MD as Attending Physician (Family Medicine)  Extended Emergency Contact Information Primary Emergency Contact: [facility] Address: [address] [address] [place] Phone: [phone] Mobile Phone: [phone] Relation: Son Secondary Emergency Contact: [facility] Address: [address]          [place], [place] [address] [place] Phone: [phone] Relation: Son  Code Status:  DNR Goals of care: Advanced Directive information Advanced Directives 06/03/2020  Does Patient Have a Medical Advance Directive? Yes  Type of Paramedic of [place];Living will;Out of facility DNR (pink MOST or yellow form)  Does patient want to make changes to medical advance directive? No - Patient declined  Copy of [place] in Chart? Yes - validated most recent copy scanned in chart (See row information)  Would patient like information on creating a medical advance directive? -  Pre-existing out of facility DNR order (yellow form or pink MOST form) Pink MOST form placed in chart (order not valid for inpatient use);Yellow form placed in chart (order not valid for inpatient use)     Chief Complaint  Patient presents with   Medical Management of Chronic Issues    Routine follow up visit.   Best Practice Recommendations    Flu vaccine    HPI:  Pt is a 86 y.o. female seen today for medical management of chronic diseases.    HTN, blood pressure is controlled,takingAmlodipine 2.5mg  qd, Metoprolol 50mg  bid, ASA 81mg  qd. Constipation, stable, on Bisacodyl suppository qod, Colace qd, Senokot S II qd.  Her mood is stable, on Escitalopram 5mg  qd,  Mirtazapine 7.5mg  qd.  RLS, maintained on Ropinirole 1mg  qd  OA chronic right knee pain, right ankle turned lateral deformity, no ambulatory, mechanical lift for transfer, w/c for mobility.   Past Medical History:  Diagnosis Date   Anemia    Hypertension    OA (osteoarthritis)    Osteoporosis    Renal cyst    Past Surgical History:  Procedure Laterality Date   APPENDECTOMY     BLADDER SUSPENSION     CATARACT EXTRACTION     CESAREAN SECTION     REPLACEMENT TOTAL KNEE  1995   TONSILLECTOMY      No Known Allergies  Allergies as of 06/03/2020   No Known Allergies     Medication List       Accurate as of June 03, 2020 11:59 PM. If you have any questions, ask your nurse or doctor.        acetaminophen 500 MG tablet Commonly known as: TYLENOL Take 1,000 mg by mouth every 8 (eight) hours as needed.   amLODipine 2.5 MG tablet Commonly known as: NORVASC Take 2.5 mg by mouth daily.   aspirin EC 81 MG tablet Take 81 mg by mouth. Once A Morning on Mon, Thu   Biotin 5000 MCG Tabs Take 5,000 mcg by mouth daily.   bisacodyl 10 MG suppository Commonly known as: DULCOLAX Place 10 mg rectally every other day.   docusate sodium 100 MG capsule Commonly known as: COLACE Take 100 mg by mouth daily.   escitalopram 5 MG tablet Commonly known as: LEXAPRO Take 5 mg  by mouth every morning.   KETOCONAZOLE (TOPICAL) 1 % Sham Apply 1 application topically.   metoprolol tartrate 50 MG tablet Commonly known as: LOPRESSOR TAKE 1 TABLET BY MOUTH TWICE DAILY.   mirtazapine 7.5 MG tablet Commonly known as: REMERON TAKE ONE TABLET AT BEDTIME.   multivitamin-lutein Caps capsule Take 1 capsule by mouth 2 (two) times daily.   rOPINIRole 1 MG tablet Commonly known as: REQUIP TAKE 1 TABLET EACH EVENING.   sennosides-docusate sodium 8.6-50 MG tablet Commonly known as: SENOKOT-S Take 2 tablets by mouth daily.   Vitamin D 50 MCG (2000 UT) Caps Take  2,000 Units by mouth daily.   zinc oxide 20 % ointment Apply 1 application topically as needed for irritation.       Review of Systems  Constitutional: Negative for fatigue, fever and unexpected weight change.  HENT: Positive for hearing loss. Negative for congestion and voice change.   Eyes: Negative for visual disturbance.  Respiratory: Negative for cough.   Cardiovascular: Positive for leg swelling.  Gastrointestinal: Negative for abdominal pain and constipation.  Genitourinary: Negative for difficulty urinating, dysuria, hematuria and urgency.       Incontinent of urine.   Musculoskeletal: Positive for arthralgias and gait problem.       Multiple sites, mostly right knee, right ankle turned laterally   Skin: Negative for color change.       Better with itching scalp since Nizoral shampoo  Neurological: Negative for speech difficulty, weakness and light-headedness.  Psychiatric/Behavioral: Negative for behavioral problems and sleep disturbance.    Immunization History  Administered Date(s) Administered   Influenza, High Dose Seasonal PF 07/10/2017, 07/15/2019   Influenza,inj,Quad PF,6+ Mos 07/03/2018   Influenza-Unspecified 07/10/2017   Moderna SARS-COVID-2 Vaccination 10/05/2019, 11/02/2019   Pneumococcal Conjugate-13 08/18/2014   Pneumococcal Polysaccharide-23 07/26/2010   Tdap 11/12/2018   Pertinent  Health Maintenance Due  Topic Date Due   INFLUENZA VACCINE  05/01/2020   DEXA SCAN  07/22/2020 (Originally 05/16/1987)   PNA vac Low Risk Adult  Completed   Fall Risk  08/12/2019 07/23/2019 07/15/2019 04/15/2019 09/02/2018  Falls in the past year? 0 0 0 0 0  Number falls in past yr: 0 0 0 0 0  Injury with Fall? - 0 - 0 0   Functional Status Survey:    Vitals:   06/03/20 1109  BP: 110/79  Pulse: 78  Resp: 16  Temp: (!) 97.3 F (36.3 C)  SpO2: 92%  Weight: 121 lb (54.9 kg)  Height: 5' (1.524 m)   Body mass index is 23.63 kg/m. Physical Exam Vitals  and nursing note reviewed.  Constitutional:      Appearance: Normal appearance.  HENT:     Head: Normocephalic and atraumatic.     Mouth/Throat:     Mouth: Mucous membranes are moist.  Eyes:     Extraocular Movements: Extraocular movements intact.     Conjunctiva/sclera: Conjunctivae normal.     Pupils: Pupils are equal, round, and reactive to light.  Cardiovascular:     Rate and Rhythm: Normal rate and regular rhythm.     Heart sounds: Murmur heard.      Comments: The R DP pulse was not felt.  Pulmonary:     Effort: Pulmonary effort is normal.     Breath sounds: No rales.  Abdominal:     General: Bowel sounds are normal. There is no distension.     Palpations: Abdomen is soft.     Tenderness: There is no abdominal tenderness. There  is no right CVA tenderness, left CVA tenderness, guarding or rebound.  Musculoskeletal:     Cervical back: Normal range of motion and neck supple.     Right lower leg: Edema present.     Left lower leg: Edema present.     Comments: Trace edema  edema BLE, R>L, right ankle turned laterally , chronic right knee pain with ROM, no longer ambulatory or weight bearing. s/p TKR  Skin:    General: Skin is warm and dry.     Comments: Noted 2 scabbed over areas on the top of head, no bleeding, ulceration, pain. ? SKs  Neurological:     General: No focal deficit present.     Mental Status: She is alert and oriented to person, place, and time. Mental status is at baseline.     Gait: Gait abnormal.  Psychiatric:        Mood and Affect: Mood normal.        Behavior: Behavior normal.        Thought Content: Thought content normal.     Labs reviewed: Recent Labs    07/23/19 0000 10/12/19 0000 03/17/20 0000  NA 143 139 141  K 3.9 4.3 4.0  CL 102 95* 105  CO2 28 38* 30*  GLUCOSE 78  --   --   BUN 29* 14 22*  CREATININE 1.12* 0.8 0.9  CALCIUM 9.4 9.2 8.8   Recent Labs    07/23/19 0000 03/17/20 0000  AST 17 14  ALT 9 10  ALKPHOS  --  65    BILITOT 0.7  --   PROT 6.8  --   ALBUMIN  --  3.2*   Recent Labs    07/23/19 0000 10/12/19 0000 03/17/20 0000  WBC 5.6 7.0 5.0  NEUTROABS 2,699 3,101 1,905  HGB 12.8 11.2* 12.5  HCT 38.4 34* 37  MCV 101.9*  --   --   PLT 176 232 180   Lab Results  Component Value Date   TSH 1.54 07/23/2019   No results found for: HGBA1C Lab Results  Component Value Date   CHOL 175 07/23/2019   HDL 54 07/23/2019   LDLCALC 105 (H) 07/23/2019   TRIG 74 07/23/2019   CHOLHDL 3.2 07/23/2019    Significant Diagnostic Results in last 30 days:  No results found.  Assessment/Plan Osteoarthritis of multiple joints chronic right knee pain, right ankle turned lateral deformity, no ambulatory, mechanical lift for transfer, w/c for mobility.    Seborrhea capitis in adult Better on Nizoral shampoo, 2 scabbed over areas on top of head, ? SK  RLS (restless legs syndrome) Stable, continue Ropinirole.   Insomnia secondary to depression with anxiety Sleeps, eats well, continue Mirtazapine, Escitalopram.   Slow transit constipation Stable, continue  Bisacodyl suppository qod, Colace qd, Senokot S II qd.    Essential hypertension blood pressure is controlled,continue Amlodipine 2.5mg  qd, Metoprolol 50mg  bid, ASA 81mg  qd     Family/ staff Communication: plan of care reviewed with the patient and charge nurse.   Labs/tests ordered:  none  Time spend 25 minutes.

## 2020-06-03 NOTE — Assessment & Plan Note (Signed)
Sleeps, eats well, continue Mirtazapine, Escitalopram.

## 2020-06-07 ENCOUNTER — Encounter: Payer: Self-pay | Admitting: Nurse Practitioner

## 2020-06-08 DIAGNOSIS — R293 Abnormal posture: Secondary | ICD-10-CM | POA: Diagnosis not present

## 2020-06-08 DIAGNOSIS — M6281 Muscle weakness (generalized): Secondary | ICD-10-CM | POA: Diagnosis not present

## 2020-06-08 DIAGNOSIS — G4700 Insomnia, unspecified: Secondary | ICD-10-CM | POA: Diagnosis not present

## 2020-06-08 DIAGNOSIS — M76819 Anterior tibial syndrome, unspecified leg: Secondary | ICD-10-CM | POA: Diagnosis not present

## 2020-06-08 DIAGNOSIS — G2581 Restless legs syndrome: Secondary | ICD-10-CM | POA: Diagnosis not present

## 2020-06-08 DIAGNOSIS — Z9181 History of falling: Secondary | ICD-10-CM | POA: Diagnosis not present

## 2020-06-15 ENCOUNTER — Encounter: Payer: Self-pay | Admitting: Internal Medicine

## 2020-06-15 ENCOUNTER — Non-Acute Institutional Stay (SKILLED_NURSING_FACILITY): Payer: PPO | Admitting: Internal Medicine

## 2020-06-15 DIAGNOSIS — R300 Dysuria: Secondary | ICD-10-CM | POA: Diagnosis not present

## 2020-06-15 DIAGNOSIS — N390 Urinary tract infection, site not specified: Secondary | ICD-10-CM | POA: Diagnosis not present

## 2020-06-15 NOTE — Progress Notes (Signed)
A user error has taken place.

## 2020-06-15 NOTE — Progress Notes (Signed)
Location:    [place] Room Number: 1 Place of Service:  SNF (31) Provider:  Veleta Miners MD  Mast, Man X, NP  Patient Care Team: Mast, Man X, NP as PCP - General (Internal Medicine) Wardell Honour, MD as Attending Physician (Family Medicine)  Extended Emergency Contact Information Primary Emergency Contact: [facility] Address: [place] [address] [place] Phone: [phone] Mobile Phone: [phone] Relation: Son Secondary Emergency Contact: [facility] Address: [place]          [place], [place] [address] [place] Phone: [phone] Relation: Son  Code Status:  DNR Goals of care: Advanced Directive information Advanced Directives 06/03/2020  Does Patient Have a Medical Advance Directive? Yes  Type of Paramedic of [place];Living will;Out of facility DNR (pink MOST or yellow form)  Does patient want to make changes to medical advance directive? No - Patient declined  Copy of [place] in Chart? Yes - validated most recent copy scanned in chart (See row information)  Would patient like information on creating a medical advance directive? -  Pre-existing out of facility DNR order (yellow form or pink MOST form) Pink MOST form placed in chart (order not valid for inpatient use);Yellow form placed in chart (order not valid for inpatient use)     Chief Complaint  Patient presents with  . Acute Visit    Dysuria    HPI:  Ann Jensen is a 86 y.o. female seen today for an acute visit for Dysuria  Patient has h/o Hypertension, Anxiety, Restless leg Syndrome, Insomnia, Arthritis and Posterior Tendon Dysfunction with right foot Valgus in Right Knee with inability toAmbulate  Seen for c/o Pain when Micturating  Also c/o Dysuria and increased Frequency Has UA done few weeks ago which showed Leucocytes but  Culture was negative No Fever or  chills. No Nausea or vomiting No Abdominal pain  Past Medical History:  Diagnosis Date  . Anemia   . Hypertension   . OA (osteoarthritis)   . Osteoporosis   . Renal cyst    Past Surgical History:  Procedure Laterality Date  . APPENDECTOMY    . BLADDER SUSPENSION    . CATARACT EXTRACTION    . CESAREAN SECTION    . REPLACEMENT TOTAL KNEE  1995  . TONSILLECTOMY      No Known Allergies  Allergies as of 06/15/2020   No Known Allergies     Medication List       Accurate as of June 15, 2020 10:24 AM. If you have any questions, ask your nurse or doctor.        acetaminophen 500 MG tablet Commonly known as: TYLENOL Take 1,000 mg by mouth every 8 (eight) hours as needed.   amLODipine 2.5 MG tablet Commonly known as: NORVASC Take 2.5 mg by mouth daily.   aspirin EC 81 MG tablet Take 81 mg by mouth. Once A Morning on Mon, Thu   Biotin 5000 MCG Tabs Take 5,000 mcg by mouth daily.   bisacodyl 10 MG suppository Commonly known as: DULCOLAX Place 10 mg rectally every other day.   docusate sodium 100 MG capsule Commonly known as: COLACE Take 100 mg by mouth daily.   escitalopram 5 MG tablet Commonly known as: LEXAPRO Take 5 mg by mouth every morning.   KETOCONAZOLE (TOPICAL) 1 % Sham Apply 1 application topically.   metoprolol tartrate  50 MG tablet Commonly known as: LOPRESSOR TAKE 1 TABLET BY MOUTH TWICE DAILY.   mirtazapine 7.5 MG tablet Commonly known as: REMERON TAKE ONE TABLET AT BEDTIME.   multivitamin-lutein Caps capsule Take 1 capsule by mouth 2 (two) times daily.   rOPINIRole 1 MG tablet Commonly known as: REQUIP TAKE 1 TABLET EACH EVENING.   sennosides-docusate sodium 8.6-50 MG tablet Commonly known as: SENOKOT-S Take 2 tablets by mouth daily.   Vitamin D 50 MCG (2000 UT) Caps Take 2,000 Units by mouth daily.   zinc oxide 20 % ointment Apply 1 application topically as needed for irritation.       Review of Systems    Constitutional: Negative for chills and fever.  HENT: Negative.   Respiratory: Negative.   Cardiovascular: Positive for leg swelling.  Gastrointestinal: Negative.   Genitourinary: Positive for dysuria, frequency and urgency.  Musculoskeletal: Positive for gait problem.  Skin: Negative.   Neurological: Negative for dizziness.  Psychiatric/Behavioral: Negative.     Immunization History  Administered Date(s) Administered  . Influenza, High Dose Seasonal PF 07/10/2017, 07/15/2019  . Influenza,inj,Quad PF,6+ Mos 07/03/2018  . Influenza-Unspecified 07/10/2017  . Moderna SARS-COVID-2 Vaccination 10/05/2019, 11/02/2019  . Pneumococcal Conjugate-13 08/18/2014  . Pneumococcal Polysaccharide-23 07/26/2010  . Tdap 11/12/2018   Pertinent  Health Maintenance Due  Topic Date Due  . INFLUENZA VACCINE  05/01/2020  . DEXA SCAN  07/22/2020 (Originally 05/16/1987)  . PNA vac Low Risk Adult  Completed   Fall Risk  08/12/2019 07/23/2019 07/15/2019 04/15/2019 09/02/2018  Falls in the past year? 0 0 0 0 0  Number falls in past yr: 0 0 0 0 0  Injury with Fall? - 0 - 0 0   Functional Status Survey:    Vitals:   06/15/20 1021  BP: (!) 160/90  Pulse: 80  Resp: (!) 24  Temp: 98.9 F (37.2 C)  SpO2: 90%  Weight: 125 lb (56.7 kg)  Height: 5' (1.524 m)   Body mass index is 24.41 kg/m. Physical Exam Vitals reviewed.  Constitutional:      Appearance: Normal appearance.  HENT:     Head: Normocephalic.     Nose: Nose normal.     Mouth/Throat:     Mouth: Mucous membranes are moist.     Pharynx: Oropharynx is clear.  Cardiovascular:     Rate and Rhythm: Normal rate and regular rhythm.  Pulmonary:     Effort: Pulmonary effort is normal.     Breath sounds: Normal breath sounds.  Abdominal:     General: Abdomen is flat. Bowel sounds are normal.     Palpations: Abdomen is soft.  Genitourinary:    Comments: No Rash or Swelling Noticed Musculoskeletal:        General: Swelling present.      Cervical back: Normal range of motion and neck supple.     Comments: Mild Edema Bilateral  Skin:    General: Skin is warm and dry.  Neurological:     Mental Status: She is alert and oriented to person, place, and time.  Psychiatric:        Mood and Affect: Mood normal.        Thought Content: Thought content normal.     Labs reviewed: Recent Labs    07/23/19 0000 10/12/19 0000 03/17/20 0000  NA 143 139 141  K 3.9 4.3 4.0  CL 102 95* 105  CO2 28 38* 30*  GLUCOSE 78  --   --   BUN 29* 14  22*  CREATININE 1.12* 0.8 0.9  CALCIUM 9.4 9.2 8.8   Recent Labs    07/23/19 0000 03/17/20 0000  AST 17 14  ALT 9 10  ALKPHOS  --  65  BILITOT 0.7  --   PROT 6.8  --   ALBUMIN  --  3.2*   Recent Labs    07/23/19 0000 10/12/19 0000 03/17/20 0000  WBC 5.6 7.0 5.0  NEUTROABS 2,699 3,101 1,905  HGB 12.8 11.2* 12.5  HCT 38.4 34* 37  MCV 101.9*  --   --   PLT 176 232 180   Lab Results  Component Value Date   TSH 1.54 07/23/2019   No results found for: HGBA1C Lab Results  Component Value Date   CHOL 175 07/23/2019   HDL 54 07/23/2019   LDLCALC 105 (H) 07/23/2019   TRIG 74 07/23/2019   CHOLHDL 3.2 07/23/2019    Significant Diagnostic Results in last 30 days:  No results found.  Assessment/Plan Dysuria Encourage PO fluids Check Urine again for Analysis and Culture  Other Issues  Essential hypertension Stable on Norvasc and Metoprolol  Depression with anxietywith mild weight loss Weight stable on Remeron and Lexapro Continue to monitor Stage 3b chronic kidney disease Creat stable  Chronicright-sided low back pain with bilateral sciatica Now on Tylenol Only No Neurontin due to Night mares Uses Power chair  Anemia, unspecified type Hgb Stable B12 was normal  Restless Leg On Requip ACP She is under Palliative Care     Family/ staff Communication:   Labs/tests ordered:  UA and Cuture

## 2020-06-17 ENCOUNTER — Non-Acute Institutional Stay (SKILLED_NURSING_FACILITY): Payer: PPO | Admitting: Internal Medicine

## 2020-06-17 ENCOUNTER — Encounter: Payer: Self-pay | Admitting: Internal Medicine

## 2020-06-17 DIAGNOSIS — R300 Dysuria: Secondary | ICD-10-CM

## 2020-06-17 DIAGNOSIS — I10 Essential (primary) hypertension: Secondary | ICD-10-CM

## 2020-06-17 DIAGNOSIS — R269 Unspecified abnormalities of gait and mobility: Secondary | ICD-10-CM

## 2020-06-17 DIAGNOSIS — G2581 Restless legs syndrome: Secondary | ICD-10-CM

## 2020-06-17 NOTE — Progress Notes (Signed)
Location:    [place] Room Number: 1 Place of Service:  SNF (31) Provider:  Veleta Miners MD  Mast, Man X, NP  Patient Care Team: Mast, Man X, NP as PCP - General (Internal Medicine) Wardell Honour, MD as Attending Physician (Family Medicine)  Extended Emergency Contact Information Primary Emergency Contact: [facility] Address: [place] [address] [place] Phone: [phone] Mobile Phone: [phone] Relation: Son Secondary Emergency Contact: [facility] Address: [place] [address]          [place], [place] [address] [place] Phone: [phone] Relation: Son  Code Status:  DNR Goals of care: Advanced Directive information Advanced Directives 06/03/2020  Does Patient Have a Medical Advance Directive? Yes  Type of Paramedic of [place];Living will;Out of facility DNR (pink MOST or yellow form)  Does patient want to make changes to medical advance directive? No - Patient declined  Copy of [place] in Chart? Yes - validated most recent copy scanned in chart (See row information)  Would patient like information on creating a medical advance directive? -  Pre-existing out of facility DNR order (yellow form or pink MOST form) Pink MOST form placed in chart (order not valid for inpatient use);Yellow form placed in chart (order not valid for inpatient use)     Chief Complaint  Patient presents with  . Acute Visit    Dysuria    HPI:  Pt is a 86 y.o. female seen today for an acute visit for Dysuria  Patient has h/o Hypertension, Anxiety, Restless leg Syndrome, Insomnia, Arthritis and Posterior Tendon Dysfunction with right foot Valgus in Right Knee with inability toAmbulate  Seen for c/o Severe Pain with Micturition No Fever or Chills, Is drinking Water.  Urine Culture Pending Urine analysis shows Blood and Esterase     Past Medical  History:  Diagnosis Date  . Anemia   . Hypertension   . OA (osteoarthritis)   . Osteoporosis   . Renal cyst    Past Surgical History:  Procedure Laterality Date  . APPENDECTOMY    . BLADDER SUSPENSION    . CATARACT EXTRACTION    . CESAREAN SECTION    . REPLACEMENT TOTAL KNEE  1995  . TONSILLECTOMY      No Known Allergies  Allergies as of 06/17/2020   No Known Allergies     Medication List       Accurate as of June 17, 2020 10:57 AM. If you have any questions, ask your nurse or doctor.        acetaminophen 500 MG tablet Commonly known as: TYLENOL Take 1,000 mg by mouth every 8 (eight) hours as needed.   amLODipine 2.5 MG tablet Commonly known as: NORVASC Take 2.5 mg by mouth daily.   aspirin EC 81 MG tablet Take 81 mg by mouth. Once A Morning on Mon, Thu   Biotin 5000 MCG Tabs Take 5,000 mcg by mouth daily.   bisacodyl 10 MG suppository Commonly known as: DULCOLAX Place 10 mg rectally every other day.   docusate sodium 100 MG capsule Commonly known as: COLACE Take 100 mg by mouth daily.   escitalopram 5 MG tablet Commonly known as: LEXAPRO Take 5 mg by mouth every morning.   KETOCONAZOLE (TOPICAL) 1 % Sham Apply 1 application topically.   metoprolol tartrate 50 MG tablet Commonly known as: LOPRESSOR TAKE 1 TABLET BY  MOUTH TWICE DAILY.   mirtazapine 7.5 MG tablet Commonly known as: REMERON TAKE ONE TABLET AT BEDTIME.   multivitamin-lutein Caps capsule Take 1 capsule by mouth 2 (two) times daily.   rOPINIRole 1 MG tablet Commonly known as: REQUIP TAKE 1 TABLET EACH EVENING.   sennosides-docusate sodium 8.6-50 MG tablet Commonly known as: SENOKOT-S Take 2 tablets by mouth daily.   Vitamin D 50 MCG (2000 UT) Caps Take 2,000 Units by mouth daily.   zinc oxide 20 % ointment Apply 1 application topically as needed for irritation.       Review of Systems  Constitutional: Negative.   Respiratory: Negative.   Cardiovascular:  Negative.   Gastrointestinal: Negative for diarrhea and nausea.  Genitourinary: Positive for dysuria, frequency, pelvic pain and urgency.  Musculoskeletal: Positive for gait problem.  Neurological: Negative for dizziness.  Psychiatric/Behavioral: Negative.     Immunization History  Administered Date(s) Administered  . Influenza, High Dose Seasonal PF 07/10/2017, 07/15/2019  . Influenza,inj,Quad PF,6+ Mos 07/03/2018  . Influenza-Unspecified 07/10/2017  . Moderna SARS-COVID-2 Vaccination 10/05/2019, 11/02/2019  . Pneumococcal Conjugate-13 08/18/2014  . Pneumococcal Polysaccharide-23 07/26/2010  . Tdap 11/12/2018   Pertinent  Health Maintenance Due  Topic Date Due  . INFLUENZA VACCINE  05/01/2020  . DEXA SCAN  07/22/2020 (Originally 05/16/1987)  . PNA vac Low Risk Adult  Completed   Fall Risk  08/12/2019 07/23/2019 07/15/2019 04/15/2019 09/02/2018  Falls in the past year? 0 0 0 0 0  Number falls in past yr: 0 0 0 0 0  Injury with Fall? - 0 - 0 0   Functional Status Survey:    Vitals:   06/17/20 1049  BP: (!) 160/90  Pulse: 80  Resp: (!) 24  Temp: (!) 97.3 F (36.3 C)  SpO2: 92%  Weight: 125 lb (56.7 kg)  Height: 5' (1.524 m)   Body mass index is 24.41 kg/m. Physical Exam Vitals reviewed.  Constitutional:      Appearance: Normal appearance.  HENT:     Head: Normocephalic.     Nose: Nose normal.     Mouth/Throat:     Mouth: Mucous membranes are moist.     Pharynx: Oropharynx is clear.  Eyes:     Pupils: Pupils are equal, round, and reactive to light.  Cardiovascular:     Rate and Rhythm: Normal rate and regular rhythm.  Pulmonary:     Effort: Pulmonary effort is normal.     Breath sounds: Normal breath sounds.  Abdominal:     General: Abdomen is flat. Bowel sounds are normal.     Palpations: Abdomen is soft.  Musculoskeletal:        General: Swelling present.     Cervical back: Neck supple.  Skin:    General: Skin is warm.  Neurological:     Mental  Status: She is alert and oriented to person, place, and time.  Psychiatric:        Mood and Affect: Mood normal.        Thought Content: Thought content normal.     Labs reviewed: Recent Labs    07/23/19 0000 10/12/19 0000 03/17/20 0000  NA 143 139 141  K 3.9 4.3 4.0  CL 102 95* 105  CO2 28 38* 30*  GLUCOSE 78  --   --   BUN 29* 14 22*  CREATININE 1.12* 0.8 0.9  CALCIUM 9.4 9.2 8.8   Recent Labs    07/23/19 0000 03/17/20 0000  AST 17 14  ALT 9  10  ALKPHOS  --  65  BILITOT 0.7  --   PROT 6.8  --   ALBUMIN  --  3.2*   Recent Labs    07/23/19 0000 10/12/19 0000 03/17/20 0000  WBC 5.6 7.0 5.0  NEUTROABS 2,699 3,101 1,905  HGB 12.8 11.2* 12.5  HCT 38.4 34* 37  MCV 101.9*  --   --   PLT 176 232 180   Lab Results  Component Value Date   TSH 1.54 07/23/2019   No results found for: HGBA1C Lab Results  Component Value Date   CHOL 175 07/23/2019   HDL 54 07/23/2019   LDLCALC 105 (H) 07/23/2019   TRIG 74 07/23/2019   CHOLHDL 3.2 07/23/2019    Significant Diagnostic Results in last 30 days:  No results found.  Assessment/Plan Dysuria D/W Dr Sabra Heck Will start Empirically on Keflex 500 mg BID for 7 days Await Cultures Essential hypertension Elevated most likely as she has not been feeling well with her dyuria On Norvasc and Lopressor RLS (restless legs syndrome) Continue Requip Gait abnormality Uses Electric Power chair Depression On Lexapro and Remeron     Family/ staff Communication:   Labs/tests ordered:

## 2020-07-05 ENCOUNTER — Encounter: Payer: Self-pay | Admitting: Nurse Practitioner

## 2020-07-05 ENCOUNTER — Non-Acute Institutional Stay (SKILLED_NURSING_FACILITY): Payer: PPO | Admitting: Nurse Practitioner

## 2020-07-05 DIAGNOSIS — K5901 Slow transit constipation: Secondary | ICD-10-CM | POA: Diagnosis not present

## 2020-07-05 DIAGNOSIS — I10 Essential (primary) hypertension: Secondary | ICD-10-CM | POA: Diagnosis not present

## 2020-07-05 DIAGNOSIS — F5105 Insomnia due to other mental disorder: Secondary | ICD-10-CM | POA: Diagnosis not present

## 2020-07-05 DIAGNOSIS — L989 Disorder of the skin and subcutaneous tissue, unspecified: Secondary | ICD-10-CM

## 2020-07-05 DIAGNOSIS — G2581 Restless legs syndrome: Secondary | ICD-10-CM

## 2020-07-05 DIAGNOSIS — M159 Polyosteoarthritis, unspecified: Secondary | ICD-10-CM | POA: Diagnosis not present

## 2020-07-05 DIAGNOSIS — F418 Other specified anxiety disorders: Secondary | ICD-10-CM | POA: Diagnosis not present

## 2020-07-05 NOTE — Assessment & Plan Note (Signed)
stable, on Bisacodyl suppository qod, Colace qd, Senokot S II qd.  

## 2020-07-05 NOTE — Assessment & Plan Note (Signed)
Stable, continue Requip

## 2020-07-05 NOTE — Assessment & Plan Note (Signed)
chronic right knee pain, right ankle turned lateral deformity, no ambulatory, mechanical lift for transfer, w/c for mobility. continue Tylenol for aches/pains.

## 2020-07-05 NOTE — Assessment & Plan Note (Signed)
Noted 2 scabbed over areas on the top of head, bleeding, injured, no s/s of infection.

## 2020-07-05 NOTE — Assessment & Plan Note (Signed)
Her mood is stable, on Escitalopram 5mg  qd, Mirtazapine 7.5mg  qd.

## 2020-07-05 NOTE — Progress Notes (Signed)
Location:    [place] Room Number: 1 Place of Service:  SNF (31) Provider:  Marlana Latus NP  Emmanuell Kantz X, NP  Patient Care Team: Sheehan Stacey X, NP as PCP - General (Internal Medicine) Wardell Honour, MD as Attending Physician (Family Medicine)  Extended Emergency Contact Information Primary Emergency Contact: [facility] Address: [place] [address] [place] Phone: [phone] Mobile Phone: [phone] Relation: Son Secondary Emergency Contact: [facility] Address: [address]          [place], [place] [address] [place] Phone: [phone] Relation: Son  Code Status:  DNR Goals of care: Advanced Directive information Advanced Directives 07/05/2020  Does Patient Have a Medical Advance Directive? Yes  Type of Paramedic of [place];Living will;Out of facility DNR (pink MOST or yellow form)  Does patient want to make changes to medical advance directive? No - Patient declined  Copy of [place] in Chart? Yes - validated most recent copy scanned in chart (See row information)  Would patient like information on creating a medical advance directive? -  Pre-existing out of facility DNR order (yellow form or pink MOST form) Yellow form placed in chart (order not valid for inpatient use);Pink MOST form placed in chart (order not valid for inpatient use)     Chief Complaint  Patient presents with  . Medical Management of Chronic Issues    HPI:  Pt is a 86 y.o. female seen today for medical management of chronic diseases.    Constipation, stable, on Bisacodyl suppository qod, Colace qd, Senokot S II qd.  Her mood is stable, on Escitalopram 5mg  qd, Mirtazapine 7.5mg  qd.  RLS, maintained on Ropinirole 1mg  qd             OA chronic right knee pain, right ankle turned lateral deformity, no ambulatory, mechanical lift for  transfer, w/c for mobility.   HTN,  stable, on Bisacodyl suppository qod, Colace qd, Senokot S II qd.   Past Medical History:  Diagnosis Date  . Anemia   . Hypertension   . OA (osteoarthritis)   . Osteoporosis   . Renal cyst    Past Surgical History:  Procedure Laterality Date  . APPENDECTOMY    . BLADDER SUSPENSION    . CATARACT EXTRACTION    . CESAREAN SECTION    . REPLACEMENT TOTAL KNEE  1995  . TONSILLECTOMY      No Known Allergies  Allergies as of 07/05/2020   No Known Allergies     Medication List       Accurate as of July 05, 2020 11:59 PM. If you have any questions, ask your nurse or doctor.        acetaminophen 500 MG tablet Commonly known as: TYLENOL Take 1,000 mg by mouth every 8 (eight) hours as needed.   amLODipine 2.5 MG tablet Commonly known as: NORVASC Take 2.5 mg by mouth daily.   aspirin EC 81 MG tablet Take 81 mg by mouth. Once A Morning on Mon, Thu   Biotin 5000 MCG Tabs Take 5,000 mcg by mouth daily.   bisacodyl 10 MG suppository Commonly known as: DULCOLAX Place 10 mg rectally every other day.   docusate sodium 100 MG capsule Commonly known as: COLACE Take 100 mg by mouth daily.   escitalopram 5 MG tablet Commonly known as: LEXAPRO Take 5 mg by mouth every  morning.   KETOCONAZOLE (TOPICAL) 1 % Sham Apply 1 application topically.   metoprolol tartrate 50 MG tablet Commonly known as: LOPRESSOR TAKE 1 TABLET BY MOUTH TWICE DAILY.   mirtazapine 7.5 MG tablet Commonly known as: REMERON TAKE ONE TABLET AT BEDTIME.   multivitamin-lutein Caps capsule Take 1 capsule by mouth 2 (two) times daily.   rOPINIRole 1 MG tablet Commonly known as: REQUIP TAKE 1 TABLET EACH EVENING.   sennosides-docusate sodium 8.6-50 MG tablet Commonly known as: SENOKOT-S Take 2 tablets by mouth daily.   Vitamin D 50 MCG (2000 UT) Caps Take 2,000 Units by mouth daily.   zinc oxide 20 % ointment Apply 1 application topically as needed for  irritation.       Review of Systems  Constitutional: Negative for activity change, fever and unexpected weight change.  HENT: Positive for hearing loss. Negative for congestion and voice change.   Eyes: Negative for visual disturbance.  Respiratory: Negative for cough.   Cardiovascular: Positive for leg swelling.  Gastrointestinal: Negative for abdominal pain and constipation.  Genitourinary: Negative for dysuria, hematuria and urgency.       Incontinent of urine.   Musculoskeletal: Positive for arthralgias and gait problem.       Multiple sites, mostly right knee, right ankle turned laterally   Skin: Negative for color change.       Better with itching scalp since Nizoral shampoo  Neurological: Negative for speech difficulty, weakness and light-headedness.  Psychiatric/Behavioral: Negative for behavioral problems and sleep disturbance. The patient is not nervous/anxious.     Immunization History  Administered Date(s) Administered  . Influenza, High Dose Seasonal PF 07/10/2017, 07/15/2019  . Influenza,inj,Quad PF,6+ Mos 07/03/2018  . Influenza-Unspecified 07/10/2017  . Moderna SARS-COVID-2 Vaccination 10/05/2019, 11/02/2019  . Pneumococcal Conjugate-13 08/18/2014  . Pneumococcal Polysaccharide-23 07/26/2010  . Tdap 11/12/2018   Pertinent  Health Maintenance Due  Topic Date Due  . INFLUENZA VACCINE  05/01/2020  . DEXA SCAN  07/22/2020 (Originally 05/16/1987)  . PNA vac Low Risk Adult  Completed   Fall Risk  08/12/2019 07/23/2019 07/15/2019 04/15/2019 09/02/2018  Falls in the past year? 0 0 0 0 0  Number falls in past yr: 0 0 0 0 0  Injury with Fall? - 0 - 0 0   Functional Status Survey:    Vitals:   07/05/20 0859  BP: 120/68  Pulse: 88  Resp: 20  Temp: 97.7 F (36.5 C)  SpO2: 94%  Weight: 121 lb 9.6 oz (55.2 kg)  Height: 5' (1.524 m)   Body mass index is 23.75 kg/m. Physical Exam Vitals and nursing note reviewed.  Constitutional:      Appearance: Normal  appearance.  HENT:     Head: Normocephalic and atraumatic.     Mouth/Throat:     Mouth: Mucous membranes are moist.  Eyes:     Extraocular Movements: Extraocular movements intact.     Conjunctiva/sclera: Conjunctivae normal.     Pupils: Pupils are equal, round, and reactive to light.  Cardiovascular:     Rate and Rhythm: Normal rate and regular rhythm.     Heart sounds: Murmur heard.      Comments: The R DP pulse was not felt.  Pulmonary:     Effort: Pulmonary effort is normal.     Breath sounds: No rales.  Abdominal:     General: Bowel sounds are normal.     Palpations: Abdomen is soft.     Tenderness: There is no abdominal tenderness.  Musculoskeletal:  Cervical back: Normal range of motion and neck supple.     Right lower leg: Edema present.     Left lower leg: Edema present.     Comments: Trace edema  edema BLE, R>L, right ankle turned laterally , chronic right knee pain with ROM, no longer ambulatory or weight bearing. s/p TKR  Skin:    General: Skin is warm and dry.     Comments: Noted 2 scabbed over areas on the top of head, bleeding, injured, no s/s of infection.   Neurological:     General: No focal deficit present.     Mental Status: She is alert and oriented to person, place, and time. Mental status is at baseline.     Gait: Gait abnormal.  Psychiatric:        Mood and Affect: Mood normal.        Behavior: Behavior normal.        Thought Content: Thought content normal.     Labs reviewed: Recent Labs    07/23/19 0000 10/12/19 0000 03/17/20 0000  NA 143 139 141  K 3.9 4.3 4.0  CL 102 95* 105  CO2 28 38* 30*  GLUCOSE 78  --   --   BUN 29* 14 22*  CREATININE 1.12* 0.8 0.9  CALCIUM 9.4 9.2 8.8   Recent Labs    07/23/19 0000 03/17/20 0000  AST 17 14  ALT 9 10  ALKPHOS  --  65  BILITOT 0.7  --   PROT 6.8  --   ALBUMIN  --  3.2*   Recent Labs    07/23/19 0000 10/12/19 0000 03/17/20 0000  WBC 5.6 7.0 5.0  NEUTROABS 2,699 3,101 1,905  HGB  12.8 11.2* 12.5  HCT 38.4 34* 37  MCV 101.9*  --   --   PLT 176 232 180   Lab Results  Component Value Date   TSH 1.54 07/23/2019   No results found for: HGBA1C Lab Results  Component Value Date   CHOL 175 07/23/2019   HDL 54 07/23/2019   LDLCALC 105 (H) 07/23/2019   TRIG 74 07/23/2019   CHOLHDL 3.2 07/23/2019    Significant Diagnostic Results in last 30 days:  No results found.  Assessment/Plan Osteoarthritis of multiple joints chronic right knee pain, right ankle turned lateral deformity, no ambulatory, mechanical lift for transfer, w/c for mobility. continue Tylenol for aches/pains.   Slow transit constipation  stable, on Bisacodyl suppository qod, Colace qd, Senokot S II qd.   Essential hypertension  stable, on Bisacodyl suppository qod, Colace qd, Senokot S II qd.   Insomnia secondary to depression with anxiety Her mood is stable, on Escitalopram 5mg  qd, Mirtazapine 7.5mg  qd.    RLS (restless legs syndrome) Stable, continue Requip.   Skin lesion of scalp Noted 2 scabbed over areas on the top of head, bleeding, injured, no s/s of infection.       Family/ staff Communication: plan of care reviewed with the patient and charge nurse.   Labs/tests ordered:  none  Time spend 35 minutes.

## 2020-07-07 DIAGNOSIS — L814 Other melanin hyperpigmentation: Secondary | ICD-10-CM | POA: Diagnosis not present

## 2020-07-07 DIAGNOSIS — L570 Actinic keratosis: Secondary | ICD-10-CM | POA: Diagnosis not present

## 2020-07-07 DIAGNOSIS — D485 Neoplasm of uncertain behavior of skin: Secondary | ICD-10-CM | POA: Diagnosis not present

## 2020-07-18 DIAGNOSIS — H5201 Hypermetropia, right eye: Secondary | ICD-10-CM | POA: Diagnosis not present

## 2020-07-18 DIAGNOSIS — H353132 Nonexudative age-related macular degeneration, bilateral, intermediate dry stage: Secondary | ICD-10-CM | POA: Diagnosis not present

## 2020-07-25 ENCOUNTER — Encounter: Payer: PPO | Admitting: Family

## 2020-07-28 ENCOUNTER — Telehealth: Payer: Self-pay

## 2020-07-28 NOTE — Telephone Encounter (Signed)
Called patient to schedule her AWV. Line continuously busy. Will try again later.

## 2020-07-29 NOTE — Telephone Encounter (Signed)
Called patient to schedule her AWV. Line continuously busy. Will try again later.

## 2020-08-02 NOTE — Telephone Encounter (Signed)
Line still busy. Unable to reach patient to schedule AWV.

## 2020-08-04 DIAGNOSIS — L821 Other seborrheic keratosis: Secondary | ICD-10-CM | POA: Diagnosis not present

## 2020-08-04 DIAGNOSIS — D044 Carcinoma in situ of skin of scalp and neck: Secondary | ICD-10-CM | POA: Diagnosis not present

## 2020-08-04 DIAGNOSIS — L570 Actinic keratosis: Secondary | ICD-10-CM | POA: Diagnosis not present

## 2020-08-04 DIAGNOSIS — L814 Other melanin hyperpigmentation: Secondary | ICD-10-CM | POA: Diagnosis not present

## 2020-08-05 ENCOUNTER — Non-Acute Institutional Stay (SKILLED_NURSING_FACILITY): Payer: PPO | Admitting: Nurse Practitioner

## 2020-08-05 ENCOUNTER — Encounter: Payer: Self-pay | Admitting: Nurse Practitioner

## 2020-08-05 DIAGNOSIS — M159 Polyosteoarthritis, unspecified: Secondary | ICD-10-CM | POA: Diagnosis not present

## 2020-08-05 DIAGNOSIS — G2581 Restless legs syndrome: Secondary | ICD-10-CM | POA: Diagnosis not present

## 2020-08-05 DIAGNOSIS — K5901 Slow transit constipation: Secondary | ICD-10-CM | POA: Diagnosis not present

## 2020-08-05 DIAGNOSIS — I10 Essential (primary) hypertension: Secondary | ICD-10-CM | POA: Diagnosis not present

## 2020-08-05 DIAGNOSIS — F418 Other specified anxiety disorders: Secondary | ICD-10-CM | POA: Diagnosis not present

## 2020-08-05 DIAGNOSIS — F5105 Insomnia due to other mental disorder: Secondary | ICD-10-CM

## 2020-08-05 NOTE — Assessment & Plan Note (Signed)
chronic right knee pain, right ankle turned lateral deformity, no ambulatory, mechanical lift for transfer, w/c for mobility.

## 2020-08-05 NOTE — Progress Notes (Signed)
Location:   [facility] Room Number: 1 Place of Service:  SNF (31) Provider:  Lumi Winslett, NP    Patient Care Team: Norma Ignasiak X, NP as PCP - General (Internal Medicine) Wardell Honour, MD as Attending Physician (Family Medicine)  Extended Emergency Contact Information Primary Emergency Contact: [facility] Address: [address]           [address] [place] Phone: [phone] Mobile Phone: [phone] Relation: Son Secondary Emergency Contact: [facility] Address: [address]          [place], [place] [address] [place] Phone: [phone] Relation: Son  Code Status:  DNR Goals of care: Advanced Directive information Advanced Directives 08/05/2020  Does Patient Have a Medical Advance Directive? Yes  Type of Paramedic of [place];Living will;Out of facility DNR (pink MOST or yellow form)  Does patient want to make changes to medical advance directive? No - Patient declined  Copy of [place] in Chart? Yes - validated most recent copy scanned in chart (See row information)  Would patient like information on creating a medical advance directive? -  Pre-existing out of facility DNR order (yellow form or pink MOST form) Yellow form placed in chart (order not valid for inpatient use);Pink MOST form placed in chart (order not valid for inpatient use)     Chief Complaint  Patient presents with  . Medical Management of Chronic Issues    Routine Visit.   Marland Kitchen Health Maintenance    Discuss the need for Dexa Scan.    HPI:  Pt is a 86 y.o. female seen today for medical management of chronic diseases.   Constipation, stable, on Bisacodyl suppository qod, Colace qd, Senokot S II qd.  Her mood is stable, on Escitalopram 5mg  qd, Mirtazapine 7.5mg  qd.  RLS, maintained on Ropinirole 1mg  qd OA chronic right knee pain, right ankle  turned lateral deformity, no ambulatory, mechanical lift for transfer, w/c for mobility.             HTN, takes Metoprolol 50mg  bid, Amlodipine 2.5mg  qd.   Past Medical History:  Diagnosis Date  . Anemia   . Hypertension   . OA (osteoarthritis)   . Osteoporosis   . Renal cyst    Past Surgical History:  Procedure Laterality Date  . APPENDECTOMY    . BLADDER SUSPENSION    . CATARACT EXTRACTION    . CESAREAN SECTION    . REPLACEMENT TOTAL KNEE  1995  . TONSILLECTOMY      No Known Allergies  Allergies as of 08/05/2020   No Known Allergies     Medication List       Accurate as of August 05, 2020 11:59 PM. If you have any questions, ask your nurse or doctor.        acetaminophen 500 MG tablet Commonly known as: TYLENOL Take 1,000 mg by mouth every 8 (eight) hours as needed.   amLODipine 2.5 MG tablet Commonly known as: NORVASC Take 2.5 mg by mouth daily.   aspirin EC 81 MG tablet Take 81 mg by mouth. Once A Morning on Mon, Thu   Biotin 5000 MCG Tabs Take 5,000 mcg by mouth daily.   bisacodyl 10 MG suppository Commonly known as: DULCOLAX Place 10 mg rectally every other day.   docusate sodium 100 MG capsule Commonly known as: COLACE Take 100 mg by mouth daily.   escitalopram 5 MG tablet Commonly known as:  LEXAPRO Take 5 mg by mouth every morning.   fluorouracil 5 % cream Commonly known as: EFUDEX Apply 1 application topically 2 (two) times daily.   KETOCONAZOLE (TOPICAL) 1 % Sham Apply 1 application topically.   metoprolol tartrate 50 MG tablet Commonly known as: LOPRESSOR TAKE 1 TABLET BY MOUTH TWICE DAILY.   mirtazapine 7.5 MG tablet Commonly known as: REMERON TAKE ONE TABLET AT BEDTIME.   multivitamin-lutein Caps capsule Take 1 capsule by mouth 2 (two) times daily.   rOPINIRole 1 MG tablet Commonly known as: REQUIP TAKE 1 TABLET EACH EVENING.   sennosides-docusate sodium 8.6-50 MG tablet Commonly known as: SENOKOT-S Take 2 tablets by  mouth daily.   Vitamin D 50 MCG (2000 UT) Caps Take 2,000 Units by mouth daily.   zinc oxide 20 % ointment Apply 1 application topically as needed for irritation.       Review of Systems  Constitutional: Negative for fatigue, fever and unexpected weight change.  HENT: Positive for hearing loss. Negative for congestion and voice change.   Eyes: Negative for visual disturbance.  Respiratory: Negative for cough.   Cardiovascular: Positive for leg swelling.  Gastrointestinal: Negative for abdominal pain and constipation.  Genitourinary: Negative for dysuria, hematuria and urgency.       Incontinent of urine.   Musculoskeletal: Positive for arthralgias and gait problem.       Multiple sites, mostly right knee, right ankle turned laterally, uses soft brace for knee support.   Skin: Negative for color change.       Better with itching scalp since Nizoral shampoo  Neurological: Negative for speech difficulty, weakness and headaches.  Psychiatric/Behavioral: Negative for behavioral problems and sleep disturbance. The patient is not nervous/anxious.     Immunization History  Administered Date(s) Administered  . Influenza, High Dose Seasonal PF 07/10/2017, 07/15/2019, 07/05/2020  . Influenza,inj,Quad PF,6+ Mos 07/03/2018  . Influenza-Unspecified 07/10/2017  . Moderna SARS-COVID-2 Vaccination 10/05/2019, 11/02/2019  . Pneumococcal Conjugate-13 08/18/2014  . Pneumococcal Polysaccharide-23 07/26/2010  . Tdap 11/12/2018   Pertinent  Health Maintenance Due  Topic Date Due  . DEXA SCAN  Never done  . INFLUENZA VACCINE  Completed  . PNA vac Low Risk Adult  Completed   Fall Risk  08/12/2019 07/23/2019 07/15/2019 04/15/2019 09/02/2018  Falls in the past year? 0 0 0 0 0  Number falls in past yr: 0 0 0 0 0  Injury with Fall? - 0 - 0 0   Functional Status Survey:    Vitals:   08/05/20 1120  BP: 101/74  Pulse: 70  Resp: 18  Temp: (!) 96.7 F (35.9 C)  SpO2: 93%  Weight: 121 lb 8 oz  (55.1 kg)  Height: 5' (1.524 m)   Body mass index is 23.73 kg/m. Physical Exam Vitals and nursing note reviewed.  Constitutional:      Appearance: Normal appearance.  HENT:     Head: Normocephalic and atraumatic.  Eyes:     Extraocular Movements: Extraocular movements intact.     Conjunctiva/sclera: Conjunctivae normal.     Pupils: Pupils are equal, round, and reactive to light.  Cardiovascular:     Rate and Rhythm: Normal rate and regular rhythm.     Heart sounds: Murmur heard.      Comments: The R DP pulse was not felt.  Pulmonary:     Effort: Pulmonary effort is normal.     Breath sounds: Rales present.     Comments: R basilar rales.  Abdominal:  General: Bowel sounds are normal.     Palpations: Abdomen is soft.     Tenderness: There is no abdominal tenderness.  Musculoskeletal:     Cervical back: Normal range of motion and neck supple.     Right lower leg: Edema present.     Left lower leg: Edema present.     Comments: Trace edema  edema BLE, R>L, right ankle turned laterally , chronic right knee pain with ROM, no longer ambulatory or weight bearing. s/p TKR  Skin:    General: Skin is warm and dry.  Neurological:     General: No focal deficit present.     Mental Status: She is alert and oriented to person, place, and time. Mental status is at baseline.     Gait: Gait abnormal.  Psychiatric:        Mood and Affect: Mood normal.        Behavior: Behavior normal.        Thought Content: Thought content normal.     Labs reviewed: Recent Labs    10/12/19 0000 03/17/20 0000  NA 139 141  K 4.3 4.0  CL 95* 105  CO2 38* 30*  BUN 14 22*  CREATININE 0.8 0.9  CALCIUM 9.2 8.8   Recent Labs    03/17/20 0000  AST 14  ALT 10  ALKPHOS 65  ALBUMIN 3.2*   Recent Labs    10/12/19 0000 03/17/20 0000  WBC 7.0 5.0  NEUTROABS 3,101 1,905  HGB 11.2* 12.5  HCT 34* 37  PLT 232 180   Lab Results  Component Value Date   TSH 1.54 07/23/2019   No results found  for: HGBA1C Lab Results  Component Value Date   CHOL 175 07/23/2019   HDL 54 07/23/2019   LDLCALC 105 (H) 07/23/2019   TRIG 74 07/23/2019   CHOLHDL 3.2 07/23/2019    Significant Diagnostic Results in last 30 days:  No results found.  Assessment/Plan Essential hypertension Blood pressure is controlled, continue Metoprolol, Amlodipine.   Slow transit constipation stable, on Bisacodyl suppository qod, Colace qd, Senokot S II qd.    Osteoarthritis of multiple joints chronic right knee pain, right ankle turned lateral deformity, no ambulatory, mechanical lift for transfer, w/c for mobility.   Insomnia secondary to depression with anxiety stable, on Escitalopram 5mg  qd, Mirtazapine 7.5mg  qd.    RLS (restless legs syndrome) maintained on Ropinirole 1mg  qd      Family/ staff Communication: plan of care reviewed with the patient and charge nurse.   Labs/tests ordered:  none  Time spend 25 minutes

## 2020-08-05 NOTE — Assessment & Plan Note (Signed)
stable, on Bisacodyl suppository qod, Colace qd, Senokot S II qd.  

## 2020-08-05 NOTE — Assessment & Plan Note (Signed)
maintained on Ropinirole 1mg qd  

## 2020-08-05 NOTE — Assessment & Plan Note (Signed)
stable, on Escitalopram 5mg  qd, Mirtazapine 7.5mg  qd.

## 2020-08-05 NOTE — Assessment & Plan Note (Signed)
Blood pressure is controlled, continue Metoprolol, Amlodipine.

## 2020-08-08 NOTE — Telephone Encounter (Signed)
Patient is now a SNF patient and would not come to clinic for AWV.

## 2020-08-09 ENCOUNTER — Encounter: Payer: Self-pay | Admitting: Nurse Practitioner

## 2020-08-11 ENCOUNTER — Telehealth: Payer: Self-pay

## 2020-08-11 ENCOUNTER — Other Ambulatory Visit: Payer: Self-pay

## 2020-08-11 ENCOUNTER — Encounter: Payer: PPO | Admitting: Nurse Practitioner

## 2020-08-11 NOTE — Telephone Encounter (Addendum)
Tried calling patient to start AWV and phone is ringing busy. Made several attempts to reach patient. Tried calling patient on contact number listed [phone], [phone] Lynford Citizen, and also son Earley Favor. [name] did not answer left voicemaill asking if there was another way to reach mother and for him to call office if so. Garth picked up the phone and hung up on me.There were 3 attempts made to reach out to patient, all being unsuccessful.  1st attempt 11:34 am 2nd attempt 1:05 pm son [name] 3rd attempt 1:06 pm son Earley Favor

## 2020-08-12 ENCOUNTER — Encounter: Payer: PPO | Admitting: Family

## 2020-08-22 ENCOUNTER — Non-Acute Institutional Stay (INDEPENDENT_AMBULATORY_CARE_PROVIDER_SITE_OTHER): Payer: PPO | Admitting: Nurse Practitioner

## 2020-08-22 ENCOUNTER — Encounter: Payer: Self-pay | Admitting: Nurse Practitioner

## 2020-08-22 DIAGNOSIS — Z0000 Encounter for general adult medical examination without abnormal findings: Secondary | ICD-10-CM

## 2020-08-23 ENCOUNTER — Encounter: Payer: Self-pay | Admitting: Nurse Practitioner

## 2020-08-23 NOTE — Progress Notes (Deleted)
Location:   [facility] Room Number: 1 Place of Service:  SNF (31) Provider:  Mast, ManXie  Mast, Man X, NP  Patient Care Team: Mast, Man X, NP as PCP - General (Internal Medicine) Wardell Honour, MD as Attending Physician (Family Medicine)  Extended Emergency Contact Information Primary Emergency Contact: [facility] Address: [place] [address] [place] Phone: [phone] Mobile Phone: [phone] Relation: Son Secondary Emergency Contact: [facility] Address: [address]          [place], [place] [address] [place] Phone: [phone] Relation: Son  Code Status:  DNR Goals of care: Advanced Directive information Advanced Directives 08/22/2020  Does Patient Have a Medical Advance Directive? Yes  Type of Paramedic of [place];Out of facility DNR (pink MOST or yellow form);Living will  Does patient want to make changes to medical advance directive? No - Patient declined  Copy of [place] in Chart? Yes - validated most recent copy scanned in chart (See row information)  Would patient like information on creating a medical advance directive? -  Pre-existing out of facility DNR order (yellow form or pink MOST form) -     Chief Complaint  Patient presents with  . Medicare Wellness    Annual wellness visit.  . Quality Metric Gaps    DEXA scan    HPI:  Pt is a 86 y.o. female seen today for medical management of chronic diseases.     Past Medical History:  Diagnosis Date  . Anemia   . Hypertension   . OA (osteoarthritis)   . Osteoporosis   . Renal cyst    Past Surgical History:  Procedure Laterality Date  . APPENDECTOMY    . BLADDER SUSPENSION    . CATARACT EXTRACTION    . CESAREAN SECTION    . REPLACEMENT TOTAL KNEE  1995  . TONSILLECTOMY      No Known Allergies  Allergies as of 08/22/2020   No Known Allergies       Medication List       Accurate as of August 22, 2020 11:59 PM. If you have any questions, ask your nurse or doctor.        acetaminophen 500 MG tablet Commonly known as: TYLENOL Take 1,000 mg by mouth every 8 (eight) hours as needed.   amLODipine 2.5 MG tablet Commonly known as: NORVASC Take 2.5 mg by mouth daily.   aspirin EC 81 MG tablet Take 81 mg by mouth. Once A Morning on Mon, Thu   Biotin 5000 MCG Tabs Take 5,000 mcg by mouth daily.   bisacodyl 10 MG suppository Commonly known as: DULCOLAX Place 10 mg rectally every other day.   docusate sodium 100 MG capsule Commonly known as: COLACE Take 100 mg by mouth daily.   escitalopram 5 MG tablet Commonly known as: LEXAPRO Take 5 mg by mouth every morning.   fluorouracil 5 % cream Commonly known as: EFUDEX Apply 1 application topically 2 (two) times daily.   KETOCONAZOLE (TOPICAL) 1 % Sham Apply 1 application topically.   metoprolol tartrate 50 MG tablet Commonly known as: LOPRESSOR TAKE 1 TABLET BY MOUTH TWICE DAILY.   mirtazapine 7.5 MG tablet Commonly known as: REMERON TAKE ONE TABLET AT BEDTIME.   multivitamin-lutein Caps capsule Take 1 capsule by mouth 2 (two) times daily.   rOPINIRole 1 MG tablet Commonly known as: REQUIP  TAKE 1 TABLET EACH EVENING.   sennosides-docusate sodium 8.6-50 MG tablet Commonly known as: SENOKOT-S Take 2 tablets by mouth daily.   Vitamin D 50 MCG (2000 UT) Caps Take 2,000 Units by mouth daily.   zinc oxide 20 % ointment Apply 1 application topically as needed for irritation.       Review of Systems  Immunization History  Administered Date(s) Administered  . Influenza, High Dose Seasonal PF 07/10/2017, 07/15/2019, 07/05/2020  . Influenza,inj,Quad PF,6+ Mos 07/03/2018  . Influenza-Unspecified 07/10/2017  . Moderna SARS-COVID-2 Vaccination 10/05/2019, 11/02/2019  . Pneumococcal Conjugate-13 08/18/2014  . Pneumococcal Polysaccharide-23 07/26/2010  . Tdap  11/12/2018   Pertinent  Health Maintenance Due  Topic Date Due  . DEXA SCAN  Never done  . INFLUENZA VACCINE  Completed  . PNA vac Low Risk Adult  Completed   Fall Risk  08/12/2019 07/23/2019 07/15/2019 04/15/2019 09/02/2018  Falls in the past year? 0 0 0 0 0  Number falls in past yr: 0 0 0 0 0  Injury with Fall? - 0 - 0 0   Functional Status Survey:    Vitals:   08/22/20 1643  BP: 111/75  Pulse: 75  Resp: 20  Temp: (!) 96.6 F (35.9 C)  SpO2: 93%  Weight: 121 lb 8 oz (55.1 kg)  Height: 5' (1.524 m)   Body mass index is 23.73 kg/m. Physical Exam  Labs reviewed: Recent Labs    10/12/19 0000 03/17/20 0000  NA 139 141  K 4.3 4.0  CL 95* 105  CO2 38* 30*  BUN 14 22*  CREATININE 0.8 0.9  CALCIUM 9.2 8.8   Recent Labs    03/17/20 0000  AST 14  ALT 10  ALKPHOS 65  ALBUMIN 3.2*   Recent Labs    10/12/19 0000 03/17/20 0000  WBC 7.0 5.0  NEUTROABS 3,101 1,905  HGB 11.2* 12.5  HCT 34* 37  PLT 232 180   Lab Results  Component Value Date   TSH 1.54 07/23/2019   No results found for: HGBA1C Lab Results  Component Value Date   CHOL 175 07/23/2019   HDL 54 07/23/2019   LDLCALC 105 (H) 07/23/2019   TRIG 74 07/23/2019   CHOLHDL 3.2 07/23/2019    Significant Diagnostic Results in last 30 days:  No results found.  Assessment/Plan There are no diagnoses linked to this encounter.   Family/ staff Communication:   Labs/tests ordered:

## 2020-08-23 NOTE — Progress Notes (Signed)
Subjective:   Ann Jensen is a 86 y.o. female who presents for Medicare Annual (Subsequent) preventive examination SN[facility] Cardiac Risk Factors include: advanced age (>3men, >24 women);sedentary lifestyle     Objective:    Today's Vitals   08/22/20 1643 08/23/20 1150  BP: 111/75   Pulse: 75   Resp: 20   Temp: (!) 96.6 F (35.9 C)   SpO2: 93%   Weight: 121 lb 8 oz (55.1 kg)   Height: 5' (1.524 m)   PainSc:  3    Body mass index is 23.73 kg/m.  Advanced Directives 08/22/2020 08/05/2020 07/05/2020 06/03/2020 05/23/2020 05/05/2020 03/15/2020  Does Patient Have a Medical Advance Directive? Yes Yes Yes Yes Yes Yes Yes  Type of Paramedic of [place];Out of facility DNR (pink MOST or yellow form);Living will [place];Living will;Out of facility DNR (pink MOST or yellow form) [place];Living will;Out of facility DNR (pink MOST or yellow form) [place];Living will;Out of facility DNR (pink MOST or yellow form) Out of facility DNR (pink MOST or yellow form);Living will;Healthcare Power of [place];Living will;Out of facility DNR (pink MOST or yellow form) Living will;Healthcare Power of [place];Out of facility DNR (pink MOST or yellow form)  Does patient want to make changes to medical advance directive? No - Patient declined No - Patient declined No - Patient declined No - Patient declined No - Patient declined No - Patient declined No - Patient declined  Copy of [place] in Chart? Yes - validated most recent copy scanned in chart (See row information) Yes - validated most recent copy scanned in chart (See row information) Yes - validated most recent copy scanned in chart (See row information) Yes - validated most recent copy scanned in chart (See row information) No - copy requested Yes - validated most recent copy scanned in chart (See row information) No - copy  requested  Would patient like information on creating a medical advance directive? - - - - - - -  Pre-existing out of facility DNR order (yellow form or pink MOST form) - Yellow form placed in chart (order not valid for inpatient use);Pink MOST form placed in chart (order not valid for inpatient use) Yellow form placed in chart (order not valid for inpatient use);Pink MOST form placed in chart (order not valid for inpatient use) Pink MOST form placed in chart (order not valid for inpatient use);Yellow form placed in chart (order not valid for inpatient use) - Yellow form placed in chart (order not valid for inpatient use);Pink MOST form placed in chart (order not valid for inpatient use) Yellow form placed in chart (order not valid for inpatient use);Pink MOST form placed in chart (order not valid for inpatient use)    Current Medications (verified) Outpatient Encounter Medications as of 08/22/2020  Medication Sig  . acetaminophen (TYLENOL) 500 MG tablet Take 1,000 mg by mouth every 8 (eight) hours as needed.  Marland Kitchen amLODipine (NORVASC) 2.5 MG tablet Take 2.5 mg by mouth daily.  Marland Kitchen aspirin EC 81 MG tablet Take 81 mg by mouth. Once A Morning on Mon, Thu  . Biotin 5000 MCG TABS Take 5,000 mcg by mouth daily.   . bisacodyl (DULCOLAX) 10 MG suppository Place 10 mg rectally every other day.  . Cholecalciferol (VITAMIN D) 50 MCG (2000 UT) CAPS Take 2,000 Units by mouth daily.   Marland Kitchen docusate sodium (COLACE) 100 MG capsule Take 100 mg  by mouth daily.  Marland Kitchen escitalopram (LEXAPRO) 5 MG tablet Take 5 mg by mouth every morning.  . fluorouracil (EFUDEX) 5 % cream Apply 1 application topically 2 (two) times daily.  Marland Kitchen KETOCONAZOLE, TOPICAL, 1 % SHAM Apply 1 application topically.  . metoprolol tartrate (LOPRESSOR) 50 MG tablet TAKE 1 TABLET BY MOUTH TWICE DAILY.  . mirtazapine (REMERON) 7.5 MG tablet TAKE ONE TABLET AT BEDTIME.  . multivitamin-lutein (OCUVITE-LUTEIN) CAPS capsule Take 1 capsule by mouth 2 (two) times  daily.   Marland Kitchen rOPINIRole (REQUIP) 1 MG tablet TAKE 1 TABLET EACH EVENING.  . sennosides-docusate sodium (SENOKOT-S) 8.6-50 MG tablet Take 2 tablets by mouth daily.  Marland Kitchen zinc oxide 20 % ointment Apply 1 application topically as needed for irritation.   No facility-administered encounter medications on file as of 08/22/2020.    Allergies (verified) Patient has no known allergies.   History: Past Medical History:  Diagnosis Date  . Anemia   . Hypertension   . OA (osteoarthritis)   . Osteoporosis   . Renal cyst    Past Surgical History:  Procedure Laterality Date  . APPENDECTOMY    . BLADDER SUSPENSION    . CATARACT EXTRACTION    . CESAREAN SECTION    . REPLACEMENT TOTAL KNEE  1995  . TONSILLECTOMY     Family History  Problem Relation Age of Onset  . Colon cancer Mother   . Arthritis Mother   . Heart disease Father   . Heart attack Father   . Migraines Sister   . Allergies Sister   . Asthma Sister    Social History   Socioeconomic History  . Marital status: Widowed    Spouse name: Not on file  . Number of children: 2  . Years of education: 3  . Highest education level: Not on file  Occupational History  . Occupation: Retired Marine scientist  Tobacco Use  . Smoking status: Former Smoker    Types: E-cigarettes  . Smokeless tobacco: Never Used  . Tobacco comment: when she was 86 years old  Vaping Use  . Vaping Use: Never used  Substance and Sexual Activity  . Alcohol use: No  . Drug use: No  . Sexual activity: Never    Birth control/protection: None  Other Topics Concern  . Not on file  Social History Narrative   Coffee-with Caffeine   Widow   2 sons   No pets   Retired Therapist, sports   No exercise   HCPOA, Living Will, No DNR   + glasses   + hearing aids   Social Determinants of Health   Financial Resource Strain:   . Difficulty of Paying Living Expenses: Not on file  Food Insecurity:   . Worried About Charity fundraiser in the Last Year: Not on file  . Ran Out of Food  in the Last Year: Not on file  Transportation Needs:   . Lack of Transportation (Medical): Not on file  . Lack of Transportation (Non-Medical): Not on file  Physical Activity:   . Days of Exercise per Week: Not on file  . Minutes of Exercise per Session: Not on file  Stress:   . Feeling of Stress : Not on file  Social Connections:   . Frequency of Communication with Friends and Family: Not on file  . Frequency of Social Gatherings with Friends and Family: Not on file  . Attends Religious Services: Not on file  . Active Member of Clubs or Organizations: Not on file  .  Attends Archivist Meetings: Not on file  . Marital Status: Not on file    Tobacco Counseling Counseling given: Not Answered Comment: when she was 86 years old   Clinical Intake:  Pre-visit preparation completed: Yes  Pain : 0-10 Pain Score: 3  Pain Type: Chronic pain Pain Location: Knee Pain Orientation: Right, Left Pain Descriptors / Indicators: Aching, Discomfort, Dull Pain Frequency: Several days a week Pain Relieving Factors: positional, pain medications. Effect of Pain on Daily Activities: non ambulatory  Pain Relieving Factors: positional, pain medications.  BMI - recorded: 23.73 Nutritional Status: BMI of 19-24  Normal Nutritional Risks: None Diabetes: No  How often do you need to have someone help you when you read instructions, pamphlets, or other written materials from your doctor or pharmacy?: 1 - Never  Diabetic?no  Interpreter Needed?: No  Information entered by :: Shaheed Schmuck Bretta Bang NP   Activities of Daily Living In your present state of health, do you have any difficulty performing the following activities: 08/23/2020  Hearing? Y  Vision? N  Difficulty concentrating or making decisions? N  Walking or climbing stairs? Y  Dressing or bathing? Y  Doing errands, shopping? Y  Preparing Food and eating ? N  Using the Toilet? Y  In the past six months, have you accidently  leaked urine? Y  Do you have problems with loss of bowel control? Y  Managing your Medications? Y  Managing your Finances? Y  Housekeeping or managing your Housekeeping? Y  Some recent data might be hidden    Patient Care Team: Coden Franchi X, NP as PCP - General (Internal Medicine) Wardell Honour, MD as Attending Physician (Family Medicine)  Indicate any recent Medical Services you may have received from other than [facility] providers in the past year (date may be approximate).     Assessment:   This is a routine wellness examination for [place].  Hearing/Vision screen No exam data present  Dietary issues and exercise activities discussed: Current Exercise Habits: The patient does not participate in regular exercise at present, Exercise limited by: cardiac condition(s);neurologic condition(s);orthopedic condition(s)   Depression Screen PHQ 2/9 Scores 07/23/2019 05/06/2018 01/01/2018 07/22/2017  PHQ - 2 Score 0 0 0 0    Fall Risk Fall Risk  08/12/2019 07/23/2019 07/15/2019 04/15/2019 09/02/2018  Falls in the past year? 0 0 0 0 0  Number falls in past yr: 0 0 0 0 0  Injury with Fall? - 0 - 0 0    Any stairs in or around the home? Yes  If so, are there any without handrails? No  Home free of loose throw rugs in walkways, pet beds, electrical cords, etc? No  Adequate lighting in your home to reduce risk of falls? Yes   ASSISTIVE DEVICES UTILIZED TO PREVENT FALLS:  Life alert? No  Use of a cane, walker or w/c? Yes  Grab bars in the bathroom? Yes  Shower chair or bench in shower? Yes  Elevated toilet seat or a handicapped toilet? Yes   TIMED UP AND GO:  Was the test performed? No .  Length of time to ambulate 10 feet:  sec.    Cognitive Function: MMSE - Mini Mental State Exam 01/01/2018  Orientation to time 5  Orientation to Place 5  Registration 3  Attention/ Calculation 4  Recall 1  Language- name 2 objects 2  Language- repeat 1  Language- follow 3 step command 3    Language- read & follow direction 1  Write  a sentence 1  Copy design 0  Total score 26     6CIT Screen 07/23/2019  What Year? 0 points  What month? 0 points  What time? 0 points  Count back from 20 0 points  Months in reverse 0 points  Repeat phrase 4 points  Total Score 4    Immunizations Immunization History  Administered Date(s) Administered  . Influenza, High Dose Seasonal PF 07/10/2017, 07/15/2019, 07/05/2020  . Influenza,inj,Quad PF,6+ Mos 07/03/2018  . Influenza-Unspecified 07/10/2017  . Moderna SARS-COVID-2 Vaccination 10/05/2019, 11/02/2019  . Pneumococcal Conjugate-13 08/18/2014  . Pneumococcal Polysaccharide-23 07/26/2010  . Tdap 11/12/2018    TDAP status: Due, Education has been provided regarding the importance of this vaccine. Advised may receive this vaccine at local pharmacy or Health Dept. Aware to provide a copy of the vaccination record if obtained from local pharmacy or Health Dept. Verbalized acceptance and understanding. Flu Vaccine status: Up to date Pneumococcal vaccine status: Up to date Covid-19 vaccine status: Completed vaccines  Qualifies for Shingles Vaccine? No   Zostavax completed No   Shingrix Completed?: No.    Education has been provided regarding the importance of this vaccine. Patient has been advised to call insurance company to determine out of pocket expense if they have not yet received this vaccine. Advised may also receive vaccine at local pharmacy or Health Dept. Verbalized acceptance and understanding.  Screening Tests Health Maintenance  Topic Date Due  . DEXA SCAN  Never done  . TETANUS/TDAP  11/12/2028  . INFLUENZA VACCINE  Completed  . COVID-19 Vaccine  Completed  . PNA vac Low Risk Adult  Completed    Health Maintenance  Health Maintenance Due  Topic Date Due  . DEXA SCAN  Never done    Colorectal cancer screening: No longer required.  Mammogram status: No longer required.  Bone Density status: Ordered none. Pt  provided with contact info and advised to call to schedule appt. aged out  Lung Cancer Screening: (Low Dose CT Chest recommended if Age 56-80 years, 30 pack-year currently smoking OR have quit w/in 15years.) does not qualify.   Lung Cancer Screening Referral: no  Additional Screening:  Hepatitis C Screening: does not qualify; Completed   Vision Screening: Recommended annual ophthalmology exams for early detection of glaucoma and other disorders of the eye. Is the patient up to date with their annual eye exam?  Yes  Who is the provider or what is the name of the office in which the patient attends annual eye exams? NA If pt is not established with a provider, would they like to be referred to a provider to establish care? No .   Dental Screening: Recommended annual dental exams for proper oral hygiene  Community Resource Referral / Chronic Care Management: CRR required this visit?  No   CCM required this visit?  No      Plan:     I have personally reviewed and noted the following in the patient's chart:   . Medical and social history . Use of alcohol, tobacco or illicit drugs  . Current medications and supplements . Functional ability and status . Nutritional status . Physical activity . Advanced directives . List of other physicians . Hospitalizations, surgeries, and ER visits in previous 12 months . Vitals . Screenings to include cognitive, depression, and falls . Referrals and appointments  In addition, I have reviewed and discussed with patient certain preventive protocols, quality metrics, and best practice recommendations. A written personalized care plan for preventive  services as well as general preventive health recommendations were provided to patient.     Benicia Bergevin X Son Barkan, NP   08/23/2020   Nurse Notes: none

## 2020-09-01 DIAGNOSIS — L814 Other melanin hyperpigmentation: Secondary | ICD-10-CM | POA: Diagnosis not present

## 2020-09-01 DIAGNOSIS — L570 Actinic keratosis: Secondary | ICD-10-CM | POA: Diagnosis not present

## 2020-09-01 DIAGNOSIS — L821 Other seborrheic keratosis: Secondary | ICD-10-CM | POA: Diagnosis not present

## 2020-09-01 DIAGNOSIS — D044 Carcinoma in situ of skin of scalp and neck: Secondary | ICD-10-CM | POA: Diagnosis not present

## 2020-09-15 ENCOUNTER — Encounter: Payer: Self-pay | Admitting: Internal Medicine

## 2020-09-15 ENCOUNTER — Non-Acute Institutional Stay (SKILLED_NURSING_FACILITY): Payer: PPO | Admitting: Internal Medicine

## 2020-09-15 DIAGNOSIS — M159 Polyosteoarthritis, unspecified: Secondary | ICD-10-CM | POA: Diagnosis not present

## 2020-09-15 DIAGNOSIS — I10 Essential (primary) hypertension: Secondary | ICD-10-CM

## 2020-09-15 DIAGNOSIS — F418 Other specified anxiety disorders: Secondary | ICD-10-CM

## 2020-09-15 DIAGNOSIS — R269 Unspecified abnormalities of gait and mobility: Secondary | ICD-10-CM

## 2020-09-15 DIAGNOSIS — G2581 Restless legs syndrome: Secondary | ICD-10-CM | POA: Diagnosis not present

## 2020-09-15 NOTE — Progress Notes (Signed)
Location:    [facility] Room Number: 1 Place of Service:  SNF (31) Provider:  Veleta Miners MD  Mast, Man X, NP  Patient Care Team: Mast, Man X, NP as PCP - General (Internal Medicine) Wardell Honour, MD as Attending Physician (Family Medicine)  Extended Emergency Contact Information Primary Emergency Contact: [facility] Address: [address] [address] [place] Phone: [phone] Mobile Phone: [phone] Relation: Son Secondary Emergency Contact: [facility] Address: [address]          [place], [place] [address] [place] Phone: [phone] Relation: Son  Code Status:  DNR Goals of care: Advanced Directive information Advanced Directives 08/22/2020  Does Patient Have a Medical Advance Directive? Yes  Type of Paramedic of [place];Out of facility DNR (pink MOST or yellow form);Living will  Does patient want to make changes to medical advance directive? No - Patient declined  Copy of [place] in Chart? Yes - validated most recent copy scanned in chart (See row information)  Would patient like information on creating a medical advance directive? -  Pre-existing out of facility DNR order (yellow form or pink MOST form) -     Chief Complaint  Patient presents with  . Medical Management of Chronic Issues    HPI:  Pt is a 86 y.o. female seen today for medical management of chronic diseases.    Patient has h/o Hypertension, Anxiety, Restless leg Syndrome, Insomnia, Arthritis and Posterior Tendon Dysfunction with right foot Valgus in Right Knee with inability toAmbulate  Stable Weight is stable Dependent for her transfers. Uses Electric chair for Ambulation Cognitively intact Mood is stable No New nursing issues Past Medical History:  Diagnosis Date  . Anemia   . Hypertension   . OA (osteoarthritis)   . Osteoporosis   .  Renal cyst    Past Surgical History:  Procedure Laterality Date  . APPENDECTOMY    . BLADDER SUSPENSION    . CATARACT EXTRACTION    . CESAREAN SECTION    . REPLACEMENT TOTAL KNEE  1995  . TONSILLECTOMY      No Known Allergies  Allergies as of 09/15/2020   No Known Allergies     Medication List       Accurate as of September 15, 2020 10:01 AM. If you have any questions, ask your nurse or doctor.        acetaminophen 500 MG tablet Commonly known as: TYLENOL Take 1,000 mg by mouth every 8 (eight) hours as needed.   amLODipine 2.5 MG tablet Commonly known as: NORVASC Take 2.5 mg by mouth daily.   aspirin EC 81 MG tablet Take 81 mg by mouth. Once A Morning on Mon, Thu   Biotin 5000 MCG Tabs Take 5,000 mcg by mouth daily.   bisacodyl 10 MG suppository Commonly known as: DULCOLAX Place 10 mg rectally every other day.   docusate sodium 100 MG capsule Commonly known as: COLACE Take 100 mg by mouth daily.   escitalopram 5 MG tablet Commonly known as: LEXAPRO Take 5 mg by mouth every morning.   fluorouracil 5 % cream Commonly known as: EFUDEX Apply 1 application topically 2 (two) times daily.   KETOCONAZOLE (TOPICAL) 1 % Sham Apply 1 application topically.   metoprolol tartrate 50 MG tablet Commonly known as: LOPRESSOR TAKE 1 TABLET BY MOUTH TWICE DAILY.   mineral oil-hydrophilic petrolatum  ointment Apply topically as needed for dry skin.   mirtazapine 7.5 MG tablet Commonly known as: REMERON TAKE ONE TABLET AT BEDTIME.   multivitamin-lutein Caps capsule Take 1 capsule by mouth 2 (two) times daily.   rOPINIRole 1 MG tablet Commonly known as: REQUIP TAKE 1 TABLET EACH EVENING.   sennosides-docusate sodium 8.6-50 MG tablet Commonly known as: SENOKOT-S Take 2 tablets by mouth daily.   Vitamin D 50 MCG (2000 UT) Caps Take 2,000 Units by mouth daily.   zinc oxide 20 % ointment Apply 1 application topically as needed for irritation.       Review  of Systems Review of Systems  Constitutional: Negative for activity change, appetite change, chills, diaphoresis, fatigue and fever.  HENT: Negative for mouth sores, postnasal drip, rhinorrhea, sinus pain and sore throat.   Respiratory: Negative for apnea, cough, chest tightness, shortness of breath and wheezing.   Cardiovascular: Negative for chest pain, palpitations and leg swelling.  Gastrointestinal: Negative for abdominal distention, abdominal pain, constipation, diarrhea, nausea and vomiting.  Genitourinary: Negative for dysuria and frequency.  Musculoskeletal: Negative for arthralgias, joint swelling and myalgias.  Skin: Negative for rash.  Neurological: Negative for dizziness, syncope, weakness, light-headedness and numbness.  Psychiatric/Behavioral: Negative for behavioral problems, confusion and sleep disturbance.     Immunization History  Administered Date(s) Administered  . Influenza, High Dose Seasonal PF 07/10/2017, 07/15/2019, 07/05/2020  . Influenza,inj,Quad PF,6+ Mos 07/03/2018  . Influenza-Unspecified 07/10/2017  . Moderna Sars-Covid-2 Vaccination 10/05/2019, 11/02/2019, 08/15/2020  . Pneumococcal Conjugate-13 08/18/2014  . Pneumococcal Polysaccharide-23 07/26/2010  . Tdap 11/12/2018   Pertinent  Health Maintenance Due  Topic Date Due  . DEXA SCAN  Never done  . INFLUENZA VACCINE  Completed  . PNA vac Low Risk Adult  Completed   Fall Risk  08/12/2019 07/23/2019 07/15/2019 04/15/2019 09/02/2018  Falls in the past year? 0 0 0 0 0  Number falls in past yr: 0 0 0 0 0  Injury with Fall? - 0 - 0 0   Functional Status Survey:    Vitals:   09/15/20 0953  BP: 124/86  Pulse: 70  Resp: 18  Temp: 98.1 F (36.7 C)  SpO2: 91%  Weight: 127 lb 11.2 oz (57.9 kg)  Height: 5' (1.524 m)   Body mass index is 24.94 kg/m. Physical Exam  Constitutional: Oriented to person, place, and time. Well-developed and well-nourished.  HENT:  Head: Normocephalic.  Mouth/Throat:  Oropharynx is clear and moist.  Eyes: Pupils are equal, round, and reactive to light.  Neck: Neck supple.  Cardiovascular: Normal rate and normal heart sounds.  No murmur heard. Pulmonary/Chest: Effort normal and breath sounds normal. No respiratory distress. No wheezes. She has no rales.  Abdominal: Soft. Bowel sounds are normal. No distension. There is no tenderness. There is no rebound.  Musculoskeletal: Mild swelling Bilateral Right more then Left With Chronic Changes Lymphadenopathy: none Neurological: Alert and oriented to person, place, and time.  Skin: Skin is warm and dry.  Psychiatric: Normal mood and affect. Behavior is normal. Thought content normal.    Labs reviewed: Recent Labs    10/12/19 0000 03/17/20 0000  NA 139 141  K 4.3 4.0  CL 95* 105  CO2 38* 30*  BUN 14 22*  CREATININE 0.8 0.9  CALCIUM 9.2 8.8   Recent Labs    03/17/20 0000  AST 14  ALT 10  ALKPHOS 65  ALBUMIN 3.2*   Recent Labs    10/12/19 0000 03/17/20 0000  WBC 7.0  5.0  NEUTROABS 3,101 1,905  HGB 11.2* 12.5  HCT 34* 37  PLT 232 180   Lab Results  Component Value Date   TSH 1.54 07/23/2019   No results found for: HGBA1C Lab Results  Component Value Date   CHOL 175 07/23/2019   HDL 54 07/23/2019   LDLCALC 105 (H) 07/23/2019   TRIG 74 07/23/2019   CHOLHDL 3.2 07/23/2019    Significant Diagnostic Results in last 30 days:  No results found.  Assessment/Plan Essential hypertension On Norvasc and Toprol RLS (restless legs syndrome) Continue Requip Depression with anxiety Stable on Remeron and Lexapro Gait abnormality Uses Power Chair now Osteoarthritis of multiple joints, unspecified osteoarthritis type Tylenol PRN Anemia Repeat CBC  Family/ staff Communication:   Labs/tests ordered:  CBC,CMP , TSH

## 2020-09-19 DIAGNOSIS — F0390 Unspecified dementia without behavioral disturbance: Secondary | ICD-10-CM | POA: Diagnosis not present

## 2020-09-19 DIAGNOSIS — D649 Anemia, unspecified: Secondary | ICD-10-CM | POA: Diagnosis not present

## 2020-09-19 DIAGNOSIS — E039 Hypothyroidism, unspecified: Secondary | ICD-10-CM | POA: Diagnosis not present

## 2020-09-20 LAB — HEPATIC FUNCTION PANEL
ALT: 6 — AB (ref 7–35)
AST: 13 (ref 13–35)
Alkaline Phosphatase: 61 (ref 25–125)
Bilirubin, Total: 0.5

## 2020-09-20 LAB — BASIC METABOLIC PANEL
BUN: 26 — AB (ref 4–21)
CO2: 29 — AB (ref 13–22)
Chloride: 105 (ref 99–108)
Creatinine: 1.1 (ref 0.5–1.1)
Glucose: 73
Potassium: 4 (ref 3.4–5.3)
Sodium: 140 (ref 137–147)

## 2020-09-20 LAB — CBC AND DIFFERENTIAL
HCT: 36 (ref 36–46)
Hemoglobin: 12.2 (ref 12.0–16.0)
Neutrophils Absolute: 2879
Platelets: 207 (ref 150–399)
WBC: 6.1

## 2020-09-20 LAB — COMPREHENSIVE METABOLIC PANEL
Albumin: 3.5 (ref 3.5–5.0)
Calcium: 9.2 (ref 8.7–10.7)
GFR calc Af Amer: 51
GFR calc non Af Amer: 44
Globulin: 2.5

## 2020-09-20 LAB — TSH: TSH: 1.66 (ref 0.41–5.90)

## 2020-09-20 LAB — CBC: RBC: 3.46 — AB (ref 3.87–5.11)

## 2020-10-03 ENCOUNTER — Non-Acute Institutional Stay (SKILLED_NURSING_FACILITY): Payer: PPO | Admitting: Nurse Practitioner

## 2020-10-03 ENCOUNTER — Encounter: Payer: Self-pay | Admitting: Nurse Practitioner

## 2020-10-03 DIAGNOSIS — K5901 Slow transit constipation: Secondary | ICD-10-CM | POA: Diagnosis not present

## 2020-10-03 DIAGNOSIS — F5105 Insomnia due to other mental disorder: Secondary | ICD-10-CM | POA: Diagnosis not present

## 2020-10-03 DIAGNOSIS — I10 Essential (primary) hypertension: Secondary | ICD-10-CM | POA: Diagnosis not present

## 2020-10-03 DIAGNOSIS — G2581 Restless legs syndrome: Secondary | ICD-10-CM

## 2020-10-03 DIAGNOSIS — F418 Other specified anxiety disorders: Secondary | ICD-10-CM

## 2020-10-03 DIAGNOSIS — M159 Polyosteoarthritis, unspecified: Secondary | ICD-10-CM

## 2020-10-03 NOTE — Progress Notes (Signed)
Location:   [place] Room Number: 1 Place of Service:  SNF (31) Provider:  Oleda Borski, Lorenda Peck, NP  Alexei Doswell X, NP  Patient Care Team: Delmon Andrada X, NP as PCP - General (Internal Medicine) Wardell Honour, MD as Attending Physician (Family Medicine)  Extended Emergency Contact Information Primary Emergency Contact: [facility] Address: [address] [address] [place] Phone: [phone] Mobile Phone: [phone] Relation: Son Secondary Emergency Contact: [facility] Address: [address]          [place], [place] [address] [place] Phone: [phone] Relation: Son  Code Status:  DNR Goals of care: Advanced Directive information Advanced Directives 10/03/2020  Does Patient Have a Medical Advance Directive? Yes  Type of Paramedic of [place];Living will;Out of facility DNR (pink MOST or yellow form)  Does patient want to make changes to medical advance directive? No - Patient declined  Copy of [place] in Chart? Yes - validated most recent copy scanned in chart (See row information)  Would patient like information on creating a medical advance directive? -  Pre-existing out of facility DNR order (yellow form or pink MOST form) Yellow form placed in chart (order not valid for inpatient use);Pink MOST form placed in chart (order not valid for inpatient use)     Chief Complaint  Patient presents with  . Medical Management of Chronic Issues    Routine follow up visit  . Quality Metric Gaps    Dexa scan    HPI:  Pt is a 87 y.o. female seen today for medical management of chronic diseases.    Constipation, stable, on Bisacodyl suppository qod, Colace qd, Senokot S II qd.  Her mood is stable, on Escitalopram 5mg  qd, Mirtazapine 7.5mg  qd. TSH 1.66 09/20/20 RLS, maintained on Ropinirole 1mg  qd OA chronic right knee  pain, right ankle turned lateral deformity, no ambulatory, mechanical lift for transfer, w/c for mobility. HTN, takes Metoprolol 50mg  bid, Amlodipine 2.5mg  qd. Bun/creat 26/1.1 09/20/20   Past Medical History:  Diagnosis Date  . Anemia   . Hypertension   . OA (osteoarthritis)   . Osteoporosis   . Renal cyst    Past Surgical History:  Procedure Laterality Date  . APPENDECTOMY    . BLADDER SUSPENSION    . CATARACT EXTRACTION    . CESAREAN SECTION    . REPLACEMENT TOTAL KNEE  1995  . TONSILLECTOMY      No Known Allergies  Allergies as of 10/03/2020   No Known Allergies     Medication List       Accurate as of October 03, 2020 11:59 PM. If you have any questions, ask your nurse or doctor.        STOP taking these medications   fluorouracil 5 % cream Commonly known as: EFUDEX Stopped by: Reyana Leisey X Zierra Laroque, NP     TAKE these medications   acetaminophen 500 MG tablet Commonly known as: TYLENOL Take 1,000 mg by mouth every 8 (eight) hours as needed.   amLODipine 2.5 MG tablet Commonly known as: NORVASC Take 2.5 mg by mouth daily.   aspirin EC 81 MG tablet Take 81 mg by mouth. Once A Morning on Mon, Thu   Biotin 5000 MCG Tabs Take 5,000 mcg by mouth daily.   bisacodyl 10 MG suppository Commonly known as: DULCOLAX Place 10 mg rectally every other day.  docusate sodium 100 MG capsule Commonly known as: COLACE Take 100 mg by mouth daily.   escitalopram 5 MG tablet Commonly known as: LEXAPRO Take 5 mg by mouth every morning.   KETOCONAZOLE (TOPICAL) 1 % Sham Apply 1 application topically.   metoprolol tartrate 50 MG tablet Commonly known as: LOPRESSOR TAKE 1 TABLET BY MOUTH TWICE DAILY.   mineral oil-hydrophilic petrolatum ointment Apply topically as needed for dry skin.   mirtazapine 7.5 MG tablet Commonly known as: REMERON TAKE ONE TABLET AT BEDTIME.   multivitamin-lutein Caps capsule Take 1 capsule by mouth 2 (two) times daily.    rOPINIRole 1 MG tablet Commonly known as: REQUIP TAKE 1 TABLET EACH EVENING.   sennosides-docusate sodium 8.6-50 MG tablet Commonly known as: SENOKOT-S Take 2 tablets by mouth daily.   Vitamin D 50 MCG (2000 UT) Caps Take 2,000 Units by mouth daily.   zinc oxide 20 % ointment Apply 1 application topically as needed for irritation.       Review of Systems  Constitutional: Positive for unexpected weight change. Negative for fatigue and fever.       #2-3Ibs weight gained in the past 2-3 weeks, no apparent fluid retention.   HENT: Positive for hearing loss. Negative for congestion and voice change.   Eyes: Negative for visual disturbance.  Respiratory: Negative for cough.   Cardiovascular: Positive for leg swelling.  Gastrointestinal: Negative for abdominal pain and constipation.  Genitourinary: Negative for dysuria, hematuria and urgency.       Incontinent of urine.   Musculoskeletal: Positive for arthralgias and gait problem.       Multiple sites, mostly right knee, right ankle turned laterally, uses soft brace for knee support.   Skin: Negative for color change.       Better with itching scalp since Nizoral shampoo  Neurological: Negative for speech difficulty, weakness and headaches.       BLE feels like "cardboard"  Psychiatric/Behavioral: Negative for behavioral problems and sleep disturbance. The patient is not nervous/anxious.     Immunization History  Administered Date(s) Administered  . Influenza, High Dose Seasonal PF 07/10/2017, 07/15/2019, 07/05/2020  . Influenza,inj,Quad PF,6+ Mos 07/03/2018  . Influenza-Unspecified 07/10/2017  . Moderna Sars-Covid-2 Vaccination 10/05/2019, 11/02/2019, 08/15/2020  . Pneumococcal Conjugate-13 08/18/2014  . Pneumococcal Polysaccharide-23 07/26/2010  . Tdap 11/12/2018   Pertinent  Health Maintenance Due  Topic Date Due  . DEXA SCAN  Never done  . INFLUENZA VACCINE  Completed  . PNA vac Low Risk Adult  Completed   Fall Risk   08/12/2019 07/23/2019 07/15/2019 04/15/2019 09/02/2018  Falls in the past year? 0 0 0 0 0  Number falls in past yr: 0 0 0 0 0  Injury with Fall? - 0 - 0 0   Functional Status Survey:    Vitals:   10/03/20 1001  BP: 120/73  Pulse: 70  Resp: 20  Temp: (!) 97.4 F (36.3 C)  SpO2: 94%  Weight: 130 lb (59 kg)  Height: 5' (1.524 m)   Body mass index is 25.39 kg/m. Physical Exam Vitals and nursing note reviewed.  Constitutional:      Appearance: Normal appearance.  HENT:     Head: Normocephalic and atraumatic.  Eyes:     Extraocular Movements: Extraocular movements intact.     Conjunctiva/sclera: Conjunctivae normal.     Pupils: Pupils are equal, round, and reactive to light.  Cardiovascular:     Rate and Rhythm: Normal rate and regular rhythm.     Heart  sounds: Murmur heard.      Comments: The R DP pulse was not felt.  Pulmonary:     Effort: Pulmonary effort is normal.     Breath sounds: Rales present.     Comments: R basilar rales.  Abdominal:     General: Bowel sounds are normal.     Palpations: Abdomen is soft.     Tenderness: There is no abdominal tenderness.  Musculoskeletal:     Cervical back: Normal range of motion and neck supple.     Right lower leg: Edema present.     Left lower leg: Edema present.     Comments: Trace edema  edema BLE, R>L, right ankle turned laterally , chronic right knee pain with ROM, no longer ambulatory or weight bearing. s/p TKR  Skin:    General: Skin is warm and dry.  Neurological:     General: No focal deficit present.     Mental Status: She is alert and oriented to person, place, and time. Mental status is at baseline.     Gait: Gait abnormal.  Psychiatric:        Mood and Affect: Mood normal.        Behavior: Behavior normal.        Thought Content: Thought content normal.     Labs reviewed: Recent Labs    10/12/19 0000 03/17/20 0000 09/20/20 0000  NA 139 141 140  K 4.3 4.0 4.0  CL 95* 105 105  CO2 38* 30* 29*  BUN  14 22* 26*  CREATININE 0.8 0.9 1.1  CALCIUM 9.2 8.8 9.2   Recent Labs    03/17/20 0000 09/20/20 0000  AST 14 13  ALT 10 6*  ALKPHOS 65 61  ALBUMIN 3.2* 3.5   Recent Labs    10/12/19 0000 03/17/20 0000 09/20/20 0000  WBC 7.0 5.0 6.1  NEUTROABS 3,101 1,905 2,879.00  HGB 11.2* 12.5 12.2  HCT 34* 37 36  PLT 232 180 207   Lab Results  Component Value Date   TSH 1.66 09/20/2020   No results found for: HGBA1C Lab Results  Component Value Date   CHOL 175 07/23/2019   HDL 54 07/23/2019   LDLCALC 105 (H) 07/23/2019   TRIG 74 07/23/2019   CHOLHDL 3.2 07/23/2019    Significant Diagnostic Results in last 30 days:  No results found.  Assessment/Plan Insomnia secondary to depression with anxiety Her mood is stable, on Escitalopram 5mg  qd, Mirtazapine 7.5mg  qd. TSH 1.66 09/20/20   RLS (restless legs syndrome) RLS, maintained on Ropinirole 1mg  qd   Osteoarthritis of multiple joints OA chronic right knee pain, right ankle turned lateral deformity, no ambulatory, mechanical lift for transfer, w/c for mobility.  Essential hypertension HTN, takes Metoprolol 50mg  bid, Amlodipine 2.5mg  qd. Bun/creat 26/1.1 09/20/20   Slow transit constipation Constipation, stable, on Bisacodyl suppository qod, Colace qd, Senokot S II qd.      Family/ staff Communication: plan of care reviewed with the patient and charge nurse.   Labs/tests ordered:  none  Time spend 35 minutes.

## 2020-10-04 ENCOUNTER — Encounter: Payer: Self-pay | Admitting: Nurse Practitioner

## 2020-10-04 NOTE — Assessment & Plan Note (Signed)
HTN,takes Metoprolol 50mg bid, Amlodipine 2.5mg qd.Bun/creat 26/1.1 09/20/20  

## 2020-10-04 NOTE — Assessment & Plan Note (Signed)
Constipation, stable, on Bisacodyl suppository qod, Colace qd, Senokot S II qd.   

## 2020-10-04 NOTE — Assessment & Plan Note (Signed)
OA chronic right knee pain, right ankle turned lateral deformity, no ambulatory, mechanical lift for transfer, w/c for mobility.

## 2020-10-04 NOTE — Assessment & Plan Note (Signed)
Her mood is stable, on Escitalopram 5mg qd, Mirtazapine 7.5mg qd.TSH 1.66 09/20/20 

## 2020-10-04 NOTE — Assessment & Plan Note (Signed)
RLS, maintained on Ropinirole 1mg qd 

## 2020-10-05 ENCOUNTER — Encounter: Payer: Self-pay | Admitting: Nurse Practitioner

## 2020-10-12 DIAGNOSIS — D044 Carcinoma in situ of skin of scalp and neck: Secondary | ICD-10-CM | POA: Diagnosis not present

## 2020-10-12 DIAGNOSIS — L570 Actinic keratosis: Secondary | ICD-10-CM | POA: Diagnosis not present

## 2020-10-12 DIAGNOSIS — L814 Other melanin hyperpigmentation: Secondary | ICD-10-CM | POA: Diagnosis not present

## 2020-10-12 DIAGNOSIS — L821 Other seborrheic keratosis: Secondary | ICD-10-CM | POA: Diagnosis not present

## 2020-10-27 ENCOUNTER — Non-Acute Institutional Stay (SKILLED_NURSING_FACILITY): Payer: PPO | Admitting: Internal Medicine

## 2020-10-27 DIAGNOSIS — R269 Unspecified abnormalities of gait and mobility: Secondary | ICD-10-CM | POA: Diagnosis not present

## 2020-10-27 DIAGNOSIS — G2581 Restless legs syndrome: Secondary | ICD-10-CM | POA: Diagnosis not present

## 2020-10-27 DIAGNOSIS — M25561 Pain in right knee: Secondary | ICD-10-CM

## 2020-10-27 DIAGNOSIS — F418 Other specified anxiety disorders: Secondary | ICD-10-CM

## 2020-10-27 DIAGNOSIS — N1832 Chronic kidney disease, stage 3b: Secondary | ICD-10-CM | POA: Diagnosis not present

## 2020-10-27 DIAGNOSIS — F5105 Insomnia due to other mental disorder: Secondary | ICD-10-CM | POA: Diagnosis not present

## 2020-10-27 DIAGNOSIS — I10 Essential (primary) hypertension: Secondary | ICD-10-CM | POA: Diagnosis not present

## 2020-10-27 DIAGNOSIS — M159 Polyosteoarthritis, unspecified: Secondary | ICD-10-CM

## 2020-10-28 ENCOUNTER — Encounter: Payer: Self-pay | Admitting: Internal Medicine

## 2020-10-28 NOTE — Progress Notes (Signed)
Location: [employer] features editor of Service:  SNF (31)  Provider:   Code Status: DNR Goals of Care:  Advanced Directives 10/03/2020  Does Patient Have a Medical Advance Directive? Yes  Type of Paramedic of [place];Living will;Out of facility DNR (pink MOST or yellow form)  Does patient want to make changes to medical advance directive? No - Patient declined  Copy of [place] in Chart? Yes - validated most recent copy scanned in chart (See row information)  Would patient like information on creating a medical advance directive? -  Pre-existing out of facility DNR order (yellow form or pink MOST form) Yellow form placed in chart (order not valid for inpatient use);Pink MOST form placed in chart (order not valid for inpatient use)     Chief Complaint  Patient presents with  . Acute Visit    HPI: Patient is a 87 y.o. female seen today for an acute visit for Right Knee pain  Patient has h/o Hypertension, Anxiety, Restless leg Syndrome, Insomnia, Arthritis   Patient has history of  Posterior Tendon Dysfunction with right foot Valgus in Right Knee with inability toAmbulate She had acute onset of pain in her right knee since yesterday. She is dependent on Aides to help her move  C/o Pain in the knee going up and down the leg No Falls or any other injuries Doe shave a small bruise below her knee Tylenol has helped to ease the pain  Past Medical History:  Diagnosis Date  . Anemia   . Hypertension   . OA (osteoarthritis)   . Osteoporosis   . Renal cyst     Past Surgical History:  Procedure Laterality Date  . APPENDECTOMY    . BLADDER SUSPENSION    . CATARACT EXTRACTION    . CESAREAN SECTION    . REPLACEMENT TOTAL KNEE  1995  . TONSILLECTOMY      No Known Allergies  Outpatient Encounter Medications as of 10/27/2020  Medication Sig  . acetaminophen (TYLENOL) 500 MG tablet Take 1,000 mg by mouth every 8 (eight) hours as  needed.  Marland Kitchen amLODipine (NORVASC) 2.5 MG tablet Take 2.5 mg by mouth daily.  Marland Kitchen aspirin EC 81 MG tablet Take 81 mg by mouth. Once A Morning on Mon, Thu  . Biotin 5000 MCG TABS Take 5,000 mcg by mouth daily.   . bisacodyl (DULCOLAX) 10 MG suppository Place 10 mg rectally every other day.  . Cholecalciferol (VITAMIN D) 50 MCG (2000 UT) CAPS Take 2,000 Units by mouth daily.   Marland Kitchen docusate sodium (COLACE) 100 MG capsule Take 100 mg by mouth daily.  Marland Kitchen KETOCONAZOLE, TOPICAL, 1 % SHAM Apply 1 application topically.  . metoprolol tartrate (LOPRESSOR) 50 MG tablet TAKE 1 TABLET BY MOUTH TWICE DAILY.  . mineral oil-hydrophilic petrolatum (AQUAPHOR) ointment Apply topically as needed for dry skin.  . mirtazapine (REMERON) 7.5 MG tablet TAKE ONE TABLET AT BEDTIME.  . multivitamin-lutein (OCUVITE-LUTEIN) CAPS capsule Take 1 capsule by mouth 2 (two) times daily.   Marland Kitchen rOPINIRole (REQUIP) 1 MG tablet TAKE 1 TABLET EACH EVENING.  . sennosides-docusate sodium (SENOKOT-S) 8.6-50 MG tablet Take 2 tablets by mouth daily.  Marland Kitchen zinc oxide 20 % ointment Apply 1 application topically as needed for irritation.  . [DISCONTINUED] escitalopram (LEXAPRO) 5 MG tablet Take 5 mg by mouth every morning.   No facility-administered encounter medications on file as of 10/27/2020.    Review of Systems:  Review of Systems  Review of Systems  Constitutional: Negative for activity change, appetite change, chills, diaphoresis, fatigue and fever.  HENT: Negative for mouth sores, postnasal drip, rhinorrhea, sinus pain and sore throat.   Respiratory: Negative for apnea, cough, chest tightness, shortness of breath and wheezing.   Cardiovascular: Negative for chest pain, palpitations and leg swelling.  Gastrointestinal: Negative for abdominal distention, abdominal pain, constipation, diarrhea, nausea and vomiting.  Genitourinary: Negative for dysuria and frequency.  Skin: Negative for rash.  Neurological: Negative for dizziness, syncope,  weakness, light-headedness and numbness.  Psychiatric/Behavioral: Negative for behavioral problems, confusion and sleep disturbance.     Health Maintenance  Topic Date Due  . DEXA SCAN  Never done  . TETANUS/TDAP  11/12/2028  . INFLUENZA VACCINE  Completed  . COVID-19 Vaccine  Completed  . PNA vac Low Risk Adult  Completed    Physical Exam: Vitals:   10/28/20 0947  BP: 112/77  Pulse: 86  Resp: 18  Temp: (!) 97.3 F (36.3 C)   There is no height or weight on file to calculate BMI. Physical Exam Constitutional: Oriented to person, place, and time. Well-developed and well-nourished.  HENT:  Head: Normocephalic.  Mouth/Throat: Oropharynx is clear and moist.  Eyes: Pupils are equal, round, and reactive to light.  Neck: Neck supple.  Cardiovascular: Normal rate and normal heart sounds.  No murmur heard. Pulmonary/Chest: Effort normal and breath sounds normal. No respiratory distress. No wheezes. She has no rales.  Abdominal: Soft. Bowel sounds are normal. No distension. There is no tenderness. There is no rebound.  Musculoskeletal: Right Knee has Deformity She cannot Flex the knee or move that leg Has small bruise below the knee Knee itself is not swollen or Red or Tender  Lymphadenopathy: none Neurological: Alert and oriented to person, place, and time.  Skin: Skin is warm and dry.  Psychiatric: Normal mood and affect. Behavior is normal. Thought content normal.  Labs reviewed: Basic Metabolic Panel: Recent Labs    03/17/20 0000 09/20/20 0000  NA 141 140  K 4.0 4.0  CL 105 105  CO2 30* 29*  BUN 22* 26*  CREATININE 0.9 1.1  CALCIUM 8.8 9.2  TSH  --  1.66   Liver Function Tests: Recent Labs    03/17/20 0000 09/20/20 0000  AST 14 13  ALT 10 6*  ALKPHOS 65 61  ALBUMIN 3.2* 3.5   No results for input(s): LIPASE, AMYLASE in the last 8760 hours. No results for input(s): AMMONIA in the last 8760 hours. CBC: Recent Labs    03/17/20 0000 09/20/20 0000  WBC  5.0 6.1  NEUTROABS 1,905 2,879.00  HGB 12.5 12.2  HCT 37 36  PLT 180 207   Lipid Panel: No results for input(s): CHOL, HDL, LDLCALC, TRIG, CHOLHDL, LDLDIRECT in the last 8760 hours. No results found for: HGBA1C  Procedures since last visit: No results found.  Assessment/Plan 1. Acute pain of right knee Discussed with Dr Hyacinth Meeker her son and POA Hold Xray for now. No NSAIDS Will start on Tylenol 650 mg QID   2. Osteoarthritis of multiple joints, unspecified osteoarthritis type Will try Tylenol schedeule  3. Essential hypertension Stable on Norvasc and Lopressor  4. Depression with anxiety She refusing to take Lexapro Discontinued Continue On remeron  5. Insomnia secondary to depression with anxiety Remeron  6. RLS (restless legs syndrome) ON Requip  7. Gait abnormality Supportive care  8. Stage 3b chronic kidney disease (HCC) Creat stable    Labs/tests ordered:  * No order  type specified * Next appt:  Visit date not found

## 2020-11-01 ENCOUNTER — Non-Acute Institutional Stay (SKILLED_NURSING_FACILITY): Payer: PPO | Admitting: Nurse Practitioner

## 2020-11-01 ENCOUNTER — Encounter: Payer: Self-pay | Admitting: Nurse Practitioner

## 2020-11-01 DIAGNOSIS — I10 Essential (primary) hypertension: Secondary | ICD-10-CM | POA: Diagnosis not present

## 2020-11-01 DIAGNOSIS — R52 Pain, unspecified: Secondary | ICD-10-CM | POA: Diagnosis not present

## 2020-11-01 DIAGNOSIS — K5901 Slow transit constipation: Secondary | ICD-10-CM | POA: Diagnosis not present

## 2020-11-01 DIAGNOSIS — G2581 Restless legs syndrome: Secondary | ICD-10-CM | POA: Diagnosis not present

## 2020-11-01 DIAGNOSIS — M159 Polyosteoarthritis, unspecified: Secondary | ICD-10-CM | POA: Diagnosis not present

## 2020-11-01 DIAGNOSIS — F418 Other specified anxiety disorders: Secondary | ICD-10-CM | POA: Diagnosis not present

## 2020-11-01 DIAGNOSIS — F5105 Insomnia due to other mental disorder: Secondary | ICD-10-CM | POA: Diagnosis not present

## 2020-11-01 DIAGNOSIS — M79651 Pain in right thigh: Secondary | ICD-10-CM | POA: Diagnosis not present

## 2020-11-01 NOTE — Assessment & Plan Note (Signed)
Constipation, stable, on Bisacodyl suppository qod, Colace qd, Senokot S II qd.   

## 2020-11-01 NOTE — Assessment & Plan Note (Signed)
RLS, maintained on Ropinirole 1mg  qd

## 2020-11-01 NOTE — Assessment & Plan Note (Signed)
right leg pain is chronic, a new soft tissue pain is at the area of  a large bruise right entire anterior/inner thigh. The patient denied trauma or injury, R Leg is more edematous than usual, no cord like findings in the right calf or pain in her right calf with dorsiflexion of the right foot.  Venous US to r/o DVT.

## 2020-11-01 NOTE — Assessment & Plan Note (Signed)
Her mood is stable, on Escitalopram 5mg qd, Mirtazapine 7.5mg qd.TSH 1.66 09/20/20 

## 2020-11-01 NOTE — Assessment & Plan Note (Signed)
OA chronic right knee pain, right ankle turned lateral deformity, no ambulatory, mechanical lift for transfer, w/c for mobility.continue Tylenol.

## 2020-11-01 NOTE — Progress Notes (Signed)
Location:  [facility] Room Number: 1 Place of Service:  SNF (31) Provider: Marlana Latus NP  Hayde Kilgour X, NP  Patient Care Team: Riham Polyakov X, NP as PCP - General (Internal Medicine) Wardell Honour, MD as Attending Physician (Family Medicine)  Extended Emergency Contact Information Primary Emergency Contact: [facility] Address: [place] [address] [place] Phone: [phone] Mobile Phone: [phone] Relation: Son Secondary Emergency Contact: [facility] Address: [place]          [place], [place] [address] [place] Phone: [phone] Relation: Son  Code Status:  DNR Goals of care: Advanced Directive information Advanced Directives 10/03/2020  Does Patient Have a Medical Advance Directive? Yes  Type of Paramedic of [place];Living will;Out of facility DNR (pink MOST or yellow form)  Does patient want to make changes to medical advance directive? No - Patient declined  Copy of [place] in Chart? Yes - validated most recent copy scanned in chart (See row information)  Would patient like information on creating a medical advance directive? -  Pre-existing out of facility DNR order (yellow form or pink MOST form) Yellow form placed in chart (order not valid for inpatient use);Pink MOST form placed in chart (order not valid for inpatient use)     Chief Complaint  Patient presents with  . Acute Visit    Large bruise right inner thigh, pain RLE    HPI:  Pt is a 87 y.o. female seen today for an acute visit for right leg pain, a large bruise right entire anterior/inner thigh. The patient denied trauma or injury, R Leg is more edematous than usual, no cord like findings in the right calf or pain in her right calf with dorsiflexion of the right foot.    OA chronic right knee pain, right ankle turned lateral deformity, no ambulatory, mechanical  lift for transfer, w/c for mobility.takes Tylenol.   Constipation, stable, on Bisacodyl suppository qod, Colace qd, Senokot S II qd.  Her mood is stable, on Escitalopram 5mg  qd, Mirtazapine 7.5mg  qd. TSH 1.66 09/20/20 RLS, maintained on Ropinirole 1mg  qd HTN,takes Metoprolol 50mg  bid, Amlodipine 2.5mg  qd.Bun/creat 26/1.1 09/20/20     Past Medical History:  Diagnosis Date  . Anemia   . Hypertension   . OA (osteoarthritis)   . Osteoporosis   . Renal cyst    Past Surgical History:  Procedure Laterality Date  . APPENDECTOMY    . BLADDER SUSPENSION    . CATARACT EXTRACTION    . CESAREAN SECTION    . REPLACEMENT TOTAL KNEE  1995  . TONSILLECTOMY      No Known Allergies  Allergies as of 11/01/2020   No Known Allergies     Medication List       Accurate as of November 01, 2020  4:11 PM. If you have any questions, ask your nurse or doctor.        acetaminophen 500 MG tablet Commonly known as: TYLENOL Take 1,000 mg by mouth every 8 (eight) hours as needed.   acetaminophen 325 MG tablet Commonly known as: TYLENOL Take 650 mg by mouth 4 (four) times daily.   amLODipine 2.5 MG tablet Commonly known as: NORVASC Take 2.5 mg by mouth daily.   aspirin EC 81 MG tablet Take 81 mg by mouth. Once A Morning on Mon, Thu   Biotin 5000 MCG Tabs Take  5,000 mcg by mouth daily.   bisacodyl 10 MG suppository Commonly known as: DULCOLAX Place 10 mg rectally every other day.   docusate sodium 100 MG capsule Commonly known as: COLACE Take 100 mg by mouth daily.   KETOCONAZOLE (TOPICAL) 1 % Sham Apply 1 application topically.   metoprolol tartrate 50 MG tablet Commonly known as: LOPRESSOR TAKE 1 TABLET BY MOUTH TWICE DAILY.   mineral oil-hydrophilic petrolatum ointment Apply topically as needed for dry skin.   mirtazapine 7.5 MG tablet Commonly known as: REMERON TAKE ONE TABLET AT BEDTIME.   multivitamin-lutein Caps capsule Take 1  capsule by mouth 2 (two) times daily.   rOPINIRole 1 MG tablet Commonly known as: REQUIP TAKE 1 TABLET EACH EVENING.   sennosides-docusate sodium 8.6-50 MG tablet Commonly known as: SENOKOT-S Take 2 tablets by mouth daily.   Vitamin D 50 MCG (2000 UT) Caps Take 2,000 Units by mouth daily.   zinc oxide 20 % ointment Apply 1 application topically as needed for irritation.       Review of Systems  Constitutional: Negative for appetite change, fatigue and fever.       #2-3Ibs weight gained in the past 2-3 weeks, no apparent fluid retention.   HENT: Positive for hearing loss. Negative for congestion and voice change.   Eyes: Negative for visual disturbance.  Respiratory: Negative for cough.   Cardiovascular: Positive for leg swelling.  Gastrointestinal: Negative for abdominal pain and constipation.  Genitourinary: Negative for dysuria, hematuria and urgency.       Incontinent of urine.   Musculoskeletal: Positive for arthralgias and gait problem.       Multiple sites, mostly right knee, right ankle turned laterally, uses soft brace for knee support.   Skin: Positive for color change.       A large bruise covered almost entire right thigh   Neurological: Negative for speech difficulty, weakness and headaches.       BLE feels like "cardboard"  Psychiatric/Behavioral: Negative for behavioral problems and sleep disturbance. The patient is not nervous/anxious.     Immunization History  Administered Date(s) Administered  . Influenza, High Dose Seasonal PF 07/10/2017, 07/15/2019, 07/05/2020  . Influenza,inj,Quad PF,6+ Mos 07/03/2018  . Influenza-Unspecified 07/10/2017  . Moderna Sars-Covid-2 Vaccination 10/05/2019, 11/02/2019, 08/15/2020  . Pneumococcal Conjugate-13 08/18/2014  . Pneumococcal Polysaccharide-23 07/26/2010  . Tdap 11/12/2018   Pertinent  Health Maintenance Due  Topic Date Due  . DEXA SCAN  Never done  . INFLUENZA VACCINE  Completed  . PNA vac Low Risk Adult   Completed   Fall Risk  08/12/2019 07/23/2019 07/15/2019 04/15/2019 09/02/2018  Falls in the past year? 0 0 0 0 0  Number falls in past yr: 0 0 0 0 0  Injury with Fall? - 0 - 0 0   Functional Status Survey:    Vitals:   11/01/20 1315  BP: 112/77  Pulse: 86  Resp: 18  Temp: (!) 97 F (36.1 C)  SpO2: 93%  Weight: 131 lb 3.2 oz (59.5 kg)  Height: 5' (1.524 m)   Body mass index is 25.62 kg/m. Physical Exam Vitals and nursing note reviewed.  Constitutional:      Appearance: Normal appearance.  HENT:     Head: Normocephalic and atraumatic.  Eyes:     Extraocular Movements: Extraocular movements intact.     Conjunctiva/sclera: Conjunctivae normal.     Pupils: Pupils are equal, round, and reactive to light.  Cardiovascular:     Rate and Rhythm: Normal rate  and regular rhythm.     Heart sounds: Murmur heard.      Comments: The R DP pulse was not felt.  Pulmonary:     Effort: Pulmonary effort is normal.     Breath sounds: Rales present.     Comments: R basilar rales.  Abdominal:     General: Bowel sounds are normal.     Palpations: Abdomen is soft.     Tenderness: There is no abdominal tenderness.  Musculoskeletal:     Cervical back: Normal range of motion and neck supple.     Right lower leg: Edema present.     Left lower leg: Edema present.     Comments: Trace edema  edema LLE, RLE edema 2+,  right ankle turned laterally , chronic right knee pain with ROM, no longer ambulatory or weight bearing. s/p TKR  Skin:    General: Skin is warm and dry.     Findings: Bruising present.     Comments: A large bruise covered almost entire right anterior, medial, posterior right thigh, pain in the soft tissue palpated, no cord like finding in the right calf or with the right foot dorsiflexion. Weak right dorsalis pedia pulse.   Neurological:     General: No focal deficit present.     Mental Status: She is alert and oriented to person, place, and time. Mental status is at baseline.      Gait: Gait abnormal.  Psychiatric:        Mood and Affect: Mood normal.        Behavior: Behavior normal.        Thought Content: Thought content normal.     Labs reviewed: Recent Labs    03/17/20 0000 09/20/20 0000  NA 141 140  K 4.0 4.0  CL 105 105  CO2 30* 29*  BUN 22* 26*  CREATININE 0.9 1.1  CALCIUM 8.8 9.2   Recent Labs    03/17/20 0000 09/20/20 0000  AST 14 13  ALT 10 6*  ALKPHOS 65 61  ALBUMIN 3.2* 3.5   Recent Labs    03/17/20 0000 09/20/20 0000  WBC 5.0 6.1  NEUTROABS 1,905 2,879.00  HGB 12.5 12.2  HCT 37 36  PLT 180 207   Lab Results  Component Value Date   TSH 1.66 09/20/2020   No results found for: HGBA1C Lab Results  Component Value Date   CHOL 175 07/23/2019   HDL 54 07/23/2019   LDLCALC 105 (H) 07/23/2019   TRIG 74 07/23/2019   CHOLHDL 3.2 07/23/2019    Significant Diagnostic Results in last 30 days:  No results found.  Assessment/Plan Right thigh pain right leg pain is chronic, a new soft tissue pain is at the area of  a large bruise right entire anterior/inner thigh. The patient denied trauma or injury, R Leg is more edematous than usual, no cord like findings in the right calf or pain in her right calf with dorsiflexion of the right foot.  Venous US to r/o DVT.    Osteoarthritis of multiple joints OA chronic right knee pain, right ankle turned lateral deformity, no ambulatory, mechanical lift for transfer, w/c for mobility.continue Tylenol.   Slow transit constipation Constipation, stable, on Bisacodyl suppository qod, Colace qd, Senokot S II qd.   Insomnia secondary to depression with anxiety Her mood is stable, on Escitalopram 5mg  qd, Mirtazapine 7.5mg  qd. TSH 1.66 09/20/20   RLS (restless legs syndrome) RLS, maintained on Ropinirole 1mg  qd  Essential hypertension HTN,takes Metoprolol  50mg  bid, Amlodipine 2.5mg  qd.Bun/creat 26/1.1 09/20/20     Family/ staff Communication: plan of care reviewed with  the patient and charge nurse.   Labs/tests ordered:  Venous US RLE  Time spend 35 minutes.

## 2020-11-01 NOTE — Assessment & Plan Note (Signed)
HTN,takes Metoprolol 50mg  bid, Amlodipine 2.5mg  qd.Bun/creat 26/1.1 09/20/20

## 2020-11-03 DIAGNOSIS — L814 Other melanin hyperpigmentation: Secondary | ICD-10-CM | POA: Diagnosis not present

## 2020-11-03 DIAGNOSIS — D044 Carcinoma in situ of skin of scalp and neck: Secondary | ICD-10-CM | POA: Diagnosis not present

## 2020-11-03 DIAGNOSIS — L821 Other seborrheic keratosis: Secondary | ICD-10-CM | POA: Diagnosis not present

## 2020-11-03 DIAGNOSIS — L570 Actinic keratosis: Secondary | ICD-10-CM | POA: Diagnosis not present

## 2020-11-08 ENCOUNTER — Encounter: Payer: Self-pay | Admitting: Nurse Practitioner

## 2020-11-08 ENCOUNTER — Non-Acute Institutional Stay (SKILLED_NURSING_FACILITY): Payer: PPO | Admitting: Nurse Practitioner

## 2020-11-08 DIAGNOSIS — F5105 Insomnia due to other mental disorder: Secondary | ICD-10-CM

## 2020-11-08 DIAGNOSIS — F418 Other specified anxiety disorders: Secondary | ICD-10-CM | POA: Diagnosis not present

## 2020-11-08 DIAGNOSIS — I739 Peripheral vascular disease, unspecified: Secondary | ICD-10-CM

## 2020-11-08 DIAGNOSIS — G2581 Restless legs syndrome: Secondary | ICD-10-CM | POA: Diagnosis not present

## 2020-11-08 DIAGNOSIS — M159 Polyosteoarthritis, unspecified: Secondary | ICD-10-CM

## 2020-11-08 DIAGNOSIS — K5901 Slow transit constipation: Secondary | ICD-10-CM | POA: Diagnosis not present

## 2020-11-08 DIAGNOSIS — I10 Essential (primary) hypertension: Secondary | ICD-10-CM | POA: Diagnosis not present

## 2020-11-08 NOTE — Assessment & Plan Note (Signed)
chronic right knee pain, right ankle turned lateral deformity, no ambulatory, mechanical lift for transfer, w/c for mobility.takes Tylenol.

## 2020-11-08 NOTE — Assessment & Plan Note (Signed)
stable, on Bisacodyl suppository qod, Colace qd, Senokot S II qd.

## 2020-11-08 NOTE — Assessment & Plan Note (Signed)
maintained on Ropinirole 1mg qd  

## 2020-11-08 NOTE — Assessment & Plan Note (Signed)
Edema BLE, R>L, at her baseline.

## 2020-11-08 NOTE — Assessment & Plan Note (Signed)
Her mood is stable, on Escitalopram 5mg qd, Mirtazapine 7.5mg qd.TSH 1.66 09/20/20 

## 2020-11-08 NOTE — Assessment & Plan Note (Signed)
takes Metoprolol 50mg bid, Amlodipine 2.5mg qd.Bun/creat 26/1.1 12/21  

## 2020-11-08 NOTE — Progress Notes (Signed)
Location:    [facility] Room Number: 1 Place of Service:  SNF (31) Provider: Marlana Latus NP  Hosea Hanawalt X, NP  Patient Care Team: Maryuri Warnke X, NP as PCP - General (Internal Medicine) Wardell Honour, MD as Attending Physician (Family Medicine)  Extended Emergency Contact Information Primary Emergency Contact: [facility] Address: [place] [address] [place] Phone: [phone] Mobile Phone: [phone] Relation: Son Secondary Emergency Contact: [facility] Address: [address]          [place], [place] [address] [place] Phone: [phone] Relation: Son  Code Status:  DNR Goals of care: Advanced Directive information Advanced Directives 10/03/2020  Does Patient Have a Medical Advance Directive? Yes  Type of Paramedic of [place];Living will;Out of facility DNR (pink MOST or yellow form)  Does patient want to make changes to medical advance directive? No - Patient declined  Copy of [place] in Chart? Yes - validated most recent copy scanned in chart (See row information)  Would patient like information on creating a medical advance directive? -  Pre-existing out of facility DNR order (yellow form or pink MOST form) Yellow form placed in chart (order not valid for inpatient use);Pink MOST form placed in chart (order not valid for inpatient use)     Chief Complaint  Patient presents with  . Medical Management of Chronic Issues    HPI:  Pt is a 87 y.o. female seen today for medical management of chronic diseases.      OA chronic right knee pain, right ankle turned lateral deformity, no ambulatory, mechanical lift for transfer, w/c for mobility.takes Tylenol.              Constipation, stable, on Bisacodyl suppository qod, Colace qd, Senokot S II qd.  Her mood is stable, on Escitalopram 5mg  qd, Mirtazapine 7.5mg  qd.TSH 1.66  09/20/20 RLS, maintained on Ropinirole 1mg  qd HTN,takes Metoprolol 50mg  bid, Amlodipine 2.5mg  qd.Bun/creat 26/1.1 12/21  Edema BLE, R>L, at her baseline.   Past Medical History:  Diagnosis Date  . Anemia   . Hypertension   . OA (osteoarthritis)   . Osteoporosis   . Renal cyst    Past Surgical History:  Procedure Laterality Date  . APPENDECTOMY    . BLADDER SUSPENSION    . CATARACT EXTRACTION    . CESAREAN SECTION    . REPLACEMENT TOTAL KNEE  1995  . TONSILLECTOMY      No Known Allergies  Allergies as of 11/08/2020   No Known Allergies     Medication List       Accurate as of November 08, 2020 11:59 PM. If you have any questions, ask your nurse or doctor.        acetaminophen 325 MG tablet Commonly known as: TYLENOL Take 650 mg by mouth 4 (four) times daily. What changed: Another medication with the same name was removed. Continue taking this medication, and follow the directions you see here. Changed by: Marek Nghiem X Deveon Kisiel, NP   amLODipine 2.5 MG tablet Commonly known as: NORVASC Take 2.5 mg by mouth daily.   aspirin EC 81 MG tablet Take 81 mg by mouth. Once A Morning on Mon, Thu   Biotin 5000 MCG Tabs Take 5,000 mcg by mouth daily.   bisacodyl 10 MG suppository Commonly known as: DULCOLAX Place 10 mg rectally every other day.   docusate  sodium 100 MG capsule Commonly known as: COLACE Take 100 mg by mouth daily.   fluorouracil 5 % cream Commonly known as: EFUDEX Apply topically 2 (two) times daily.   KETOCONAZOLE (TOPICAL) 1 % Sham Apply 1 application topically.   metoprolol tartrate 50 MG tablet Commonly known as: LOPRESSOR TAKE 1 TABLET BY MOUTH TWICE DAILY.   mineral oil-hydrophilic petrolatum ointment Apply topically as needed for dry skin.   mirtazapine 7.5 MG tablet Commonly known as: REMERON TAKE ONE TABLET AT BEDTIME.   multivitamin-lutein Caps capsule Take 1 capsule by mouth 2 (two) times daily.   rOPINIRole 1 MG  tablet Commonly known as: REQUIP TAKE 1 TABLET EACH EVENING.   sennosides-docusate sodium 8.6-50 MG tablet Commonly known as: SENOKOT-S Take 2 tablets by mouth daily.   Vitamin D 50 MCG (2000 UT) Caps Take 2,000 Units by mouth daily.   zinc oxide 20 % ointment Apply 1 application topically as needed for irritation.       Review of Systems  Constitutional: Negative for fatigue, fever and unexpected weight change.       #2-3Ibs weight gained in the past 2-3 weeks, no apparent fluid retention.   HENT: Positive for hearing loss. Negative for congestion and voice change.   Eyes: Negative for visual disturbance.  Respiratory: Negative for cough.   Cardiovascular: Positive for leg swelling.  Gastrointestinal: Negative for abdominal pain and constipation.  Genitourinary: Negative for dysuria, hematuria and urgency.       Incontinent of urine.   Musculoskeletal: Positive for arthralgias and gait problem.       Multiple sites, mostly right knee, right ankle turned laterally, uses soft brace for knee support.   Skin: Negative for color change.       Ecchymoses/bruise  Neurological: Negative for speech difficulty, weakness and headaches.       BLE feels like "cardboard"  Psychiatric/Behavioral: Negative for behavioral problems and sleep disturbance. The patient is not nervous/anxious.     Immunization History  Administered Date(s) Administered  . Influenza, High Dose Seasonal PF 07/10/2017, 07/15/2019, 07/05/2020  . Influenza,inj,Quad PF,6+ Mos 07/03/2018  . Influenza-Unspecified 07/10/2017  . Moderna Sars-Covid-2 Vaccination 10/05/2019, 11/02/2019, 08/15/2020  . Pneumococcal Conjugate-13 08/18/2014  . Pneumococcal Polysaccharide-23 07/26/2010  . Tdap 11/12/2018   Pertinent  Health Maintenance Due  Topic Date Due  . INFLUENZA VACCINE  Completed  . PNA vac Low Risk Adult  Completed  . DEXA SCAN  Discontinued   Fall Risk  08/12/2019 07/23/2019 07/15/2019 04/15/2019 09/02/2018   Falls in the past year? 0 0 0 0 0  Number falls in past yr: 0 0 0 0 0  Injury with Fall? - 0 - 0 0   Functional Status Survey:    Vitals:   11/08/20 1524  BP: 126/72  Pulse: 72  Temp: 97.9 F (36.6 C)  SpO2: 95%  Weight: 131 lb 3.2 oz (59.5 kg)  Height: 5' (1.524 m)   Body mass index is 25.62 kg/m. Physical Exam Vitals and nursing note reviewed.  Constitutional:      Appearance: Normal appearance.  HENT:     Head: Normocephalic and atraumatic.     Mouth/Throat:     Mouth: Mucous membranes are moist.  Eyes:     Extraocular Movements: Extraocular movements intact.     Conjunctiva/sclera: Conjunctivae normal.     Pupils: Pupils are equal, round, and reactive to light.  Cardiovascular:     Rate and Rhythm: Normal rate and regular rhythm.  Heart sounds: Murmur heard.      Comments: The R DP pulse was not felt.  Pulmonary:     Effort: Pulmonary effort is normal.     Breath sounds: Rales present.     Comments: R basilar rales.  Abdominal:     General: Bowel sounds are normal.     Palpations: Abdomen is soft.     Tenderness: There is no abdominal tenderness.  Musculoskeletal:     Cervical back: Normal range of motion and neck supple.     Right lower leg: Edema present.     Left lower leg: Edema present.     Comments: Trace edema BLE, R>L  Skin:    General: Skin is warm and dry.     Findings: Bruising present.     Comments: Right thigh bruise is healing slowly. New ecchymoses left forearm.   Neurological:     General: No focal deficit present.     Mental Status: She is alert and oriented to person, place, and time. Mental status is at baseline.     Gait: Gait abnormal.  Psychiatric:        Mood and Affect: Mood normal.        Behavior: Behavior normal.        Thought Content: Thought content normal.     Labs reviewed: Recent Labs    03/17/20 0000 09/20/20 0000  NA 141 140  K 4.0 4.0  CL 105 105  CO2 30* 29*  BUN 22* 26*  CREATININE 0.9 1.1   CALCIUM 8.8 9.2   Recent Labs    03/17/20 0000 09/20/20 0000  AST 14 13  ALT 10 6*  ALKPHOS 65 61  ALBUMIN 3.2* 3.5   Recent Labs    03/17/20 0000 09/20/20 0000  WBC 5.0 6.1  NEUTROABS 1,905 2,879.00  HGB 12.5 12.2  HCT 37 36  PLT 180 207   Lab Results  Component Value Date   TSH 1.66 09/20/2020   No results found for: HGBA1C Lab Results  Component Value Date   CHOL 175 07/23/2019   HDL 54 07/23/2019   LDLCALC 105 (H) 07/23/2019   TRIG 74 07/23/2019   CHOLHDL 3.2 07/23/2019    Significant Diagnostic Results in last 30 days:  No results found.  Assessment/Plan Osteoarthritis of multiple joints chronic right knee pain, right ankle turned lateral deformity, no ambulatory, mechanical lift for transfer, w/c for mobility.takes Tylenol.   Slow transit constipation  stable, on Bisacodyl suppository qod, Colace qd, Senokot S II qd.   Insomnia secondary to depression with anxiety Her mood is stable, on Escitalopram 5mg  qd, Mirtazapine 7.5mg  qd.TSH 1.66 09/20/20   RLS (restless legs syndrome) maintained on Ropinirole 1mg  qd  Essential hypertension takes Metoprolol 50mg  bid, Amlodipine 2.5mg  qd.Bun/creat 26/1.1 12/21   PVD (peripheral vascular disease) (HCC) Edema BLE, R>L, at her baseline.      Family/ staff Communication: plan of care reviewed with the patient and charge nurse.   Labs/tests ordered:  none  Time spend 25 minutes.

## 2020-11-09 ENCOUNTER — Encounter: Payer: Self-pay | Admitting: Nurse Practitioner

## 2020-11-11 DIAGNOSIS — B351 Tinea unguium: Secondary | ICD-10-CM | POA: Diagnosis not present

## 2020-11-11 DIAGNOSIS — M79671 Pain in right foot: Secondary | ICD-10-CM | POA: Diagnosis not present

## 2020-11-11 DIAGNOSIS — M79672 Pain in left foot: Secondary | ICD-10-CM | POA: Diagnosis not present

## 2020-11-11 DIAGNOSIS — L84 Corns and callosities: Secondary | ICD-10-CM | POA: Diagnosis not present

## 2020-11-23 ENCOUNTER — Encounter: Payer: Self-pay | Admitting: Internal Medicine

## 2020-11-23 ENCOUNTER — Non-Acute Institutional Stay (SKILLED_NURSING_FACILITY): Payer: PPO | Admitting: Internal Medicine

## 2020-11-23 DIAGNOSIS — R269 Unspecified abnormalities of gait and mobility: Secondary | ICD-10-CM

## 2020-11-23 DIAGNOSIS — N390 Urinary tract infection, site not specified: Secondary | ICD-10-CM | POA: Diagnosis not present

## 2020-11-23 DIAGNOSIS — R300 Dysuria: Secondary | ICD-10-CM | POA: Diagnosis not present

## 2020-11-23 DIAGNOSIS — M159 Polyosteoarthritis, unspecified: Secondary | ICD-10-CM | POA: Diagnosis not present

## 2020-11-23 DIAGNOSIS — I10 Essential (primary) hypertension: Secondary | ICD-10-CM

## 2020-11-23 NOTE — Progress Notes (Signed)
Location:    [facility] Room Number: 1 Place of Service:  SNF (31) Provider:  DNR Managed Care  Mast, Man X, NP  Patient Care Team: Mast, Man X, NP as PCP - General (Internal Medicine) Wardell Honour, MD as Attending Physician (Family Medicine)  Extended Emergency Contact Information Primary Emergency Contact: [facility] Address: [place] [address] [place] Phone: [phone] Mobile Phone: [phone] Relation: Son Secondary Emergency Contact: Vanetten,Garth Address: [address]          [place], [place] [address] [place] Phone: [phone] Relation: Son  Code Status:   Goals of care: Advanced Directive information Advanced Directives 10/03/2020  Does Patient Have a Medical Advance Directive? Yes  Type of Paramedic of [place];Living will;Out of facility DNR (pink MOST or yellow form)  Does patient want to make changes to medical advance directive? No - Patient declined  Copy of [place] in Chart? Yes - validated most recent copy scanned in chart (See row information)  Would patient like information on creating a medical advance directive? -  Pre-existing out of facility DNR order (yellow form or pink MOST form) Yellow form placed in chart (order not valid for inpatient use);Pink MOST form placed in chart (order not valid for inpatient use)     Chief Complaint  Patient presents with  . Acute Visit    Dysuria    HPI:  Pt is a 87 y.o. female seen today for an acute visit for Dysuria  Patient has h/o Hypertension, Anxiety, Restless leg Syndrome, Insomnia, Arthritis and Posterior Tendon Dysfunction with right foot Valgus in Right Knee with inability toAmbulate  Seen for Dysuria. Lower abdomen Discomfort No fever or Nausea. Trying to drink water to keep up her Hydration   Past Medical History:  Diagnosis Date  . Anemia   .  Hypertension   . OA (osteoarthritis)   . Osteoporosis   . Renal cyst    Past Surgical History:  Procedure Laterality Date  . APPENDECTOMY    . BLADDER SUSPENSION    . CATARACT EXTRACTION    . CESAREAN SECTION    . REPLACEMENT TOTAL KNEE  1995  . TONSILLECTOMY      No Known Allergies  Allergies as of 11/23/2020   No Known Allergies     Medication List       Accurate as of November 23, 2020  4:26 PM. If you have any questions, ask your nurse or doctor.        acetaminophen 325 MG tablet Commonly known as: TYLENOL Take 650 mg by mouth 4 (four) times daily.   amLODipine 2.5 MG tablet Commonly known as: NORVASC Take 2.5 mg by mouth daily.   aspirin EC 81 MG tablet Take 81 mg by mouth. Once A Morning on Mon, Thu   Biotin 5000 MCG Tabs Take 5,000 mcg by mouth daily.   bisacodyl 10 MG suppository Commonly known as: DULCOLAX Place 10 mg rectally every other day.   docusate sodium 100 MG capsule Commonly known as: COLACE Take 100 mg by mouth daily.   fluorouracil 5 % cream Commonly known as: EFUDEX Apply topically 2 (two) times daily.   KETOCONAZOLE (TOPICAL) 1 % Sham Apply 1 application topically.   metoprolol tartrate 50 MG tablet Commonly known as: LOPRESSOR TAKE 1 TABLET BY MOUTH TWICE DAILY.   mineral oil-hydrophilic petrolatum ointment  Apply topically as needed for dry skin.   mirtazapine 7.5 MG tablet Commonly known as: REMERON TAKE ONE TABLET AT BEDTIME.   multivitamin-lutein Caps capsule Take 1 capsule by mouth 2 (two) times daily.   rOPINIRole 1 MG tablet Commonly known as: REQUIP TAKE 1 TABLET EACH EVENING.   sennosides-docusate sodium 8.6-50 MG tablet Commonly known as: SENOKOT-S Take 2 tablets by mouth daily.   Vitamin D 50 MCG (2000 UT) Caps Take 2,000 Units by mouth daily.   zinc oxide 20 % ointment Apply 1 application topically as needed for irritation.       Review of Systems  Review of Systems  Constitutional: Negative  for activity change, appetite change, chills, diaphoresis, fatigue and fever.  HENT: Negative for mouth sores, postnasal drip, rhinorrhea, sinus pain and sore throat.   Respiratory: Negative for apnea, cough, chest tightness, shortness of breath and wheezing.   Cardiovascular: Negative for chest pain, palpitations and leg swelling.  Gastrointestinal: Negative for abdominal distention, abdominal pain, constipation, diarrhea, nausea and vomiting.  Genitourinary: Positive for dysuria and frequency.  Musculoskeletal: Negative for arthralgias, joint swelling and myalgias.  Skin: Negative for rash.  Neurological: Negative for dizziness, syncope, weakness, light-headedness and numbness.  Psychiatric/Behavioral: Negative for behavioral problems, confusion and sleep disturbance.     Immunization History  Administered Date(s) Administered  . Influenza, High Dose Seasonal PF 07/10/2017, 07/15/2019, 07/05/2020  . Influenza,inj,Quad PF,6+ Mos 07/03/2018  . Influenza-Unspecified 07/10/2017  . Moderna Sars-Covid-2 Vaccination 10/05/2019, 11/02/2019, 08/15/2020  . Pneumococcal Conjugate-13 08/18/2014  . Pneumococcal Polysaccharide-23 07/26/2010  . Tdap 11/12/2018   Pertinent  Health Maintenance Due  Topic Date Due  . INFLUENZA VACCINE  Completed  . PNA vac Low Risk Adult  Completed  . DEXA SCAN  Discontinued   Fall Risk  08/12/2019 07/23/2019 07/15/2019 04/15/2019 09/02/2018  Falls in the past year? 0 0 0 0 0  Number falls in past yr: 0 0 0 0 0  Injury with Fall? - 0 - 0 0   Functional Status Survey:    Vitals:   11/23/20 1622  BP: (!) 143/90  Pulse: 73  Resp: 20  Temp: 97.7 F (36.5 C)  SpO2: 94%  Weight: 126 lb 8 oz (57.4 kg)  Height: 5' (1.524 m)   Body mass index is 24.71 kg/m. Physical Exam Constitutional: Oriented to person, place, and time. Well-developed and well-nourished.  HENT:  Head: Normocephalic.  Mouth/Throat: Oropharynx is clear and moist.  Eyes: Pupils are equal,  round, and reactive to light.  Neck: Neck supple.  Cardiovascular: Normal rate and normal heart sounds.  No murmur heard. Pulmonary/Chest: Effort normal and breath sounds normal. No respiratory distress. No wheezes. She has no rales.  Abdominal: Soft. Bowel sounds are normal. No distension. There is no tenderness. There is no rebound.  Musculoskeletal:  Mild swelling Bilateral Right more then Left With Chronic Changes Lymphadenopathy: none Neurological: Alert and oriented to person, place, and time.  Skin: Skin is warm and dry.  Psychiatric: Normal mood and affect. Behavior is normal. Thought content normal.   Labs reviewed: Recent Labs    03/17/20 0000 09/20/20 0000  NA 141 140  K 4.0 4.0  CL 105 105  CO2 30* 29*  BUN 22* 26*  CREATININE 0.9 1.1  CALCIUM 8.8 9.2   Recent Labs    03/17/20 0000 09/20/20 0000  AST 14 13  ALT 10 6*  ALKPHOS 65 61  ALBUMIN 3.2* 3.5   Recent Labs    03/17/20 0000  09/20/20 0000  WBC 5.0 6.1  NEUTROABS 1,905 2,879.00  HGB 12.5 12.2  HCT 37 36  PLT 180 207   Lab Results  Component Value Date   TSH 1.66 09/20/2020   No results found for: HGBA1C Lab Results  Component Value Date   CHOL 175 07/23/2019   HDL 54 07/23/2019   LDLCALC 105 (H) 07/23/2019   TRIG 74 07/23/2019   CHOLHDL 3.2 07/23/2019    Significant Diagnostic Results in last 30 days:  No results found.  Assessment/Plan Dysuria Will Order UA and Culture Continue PO hydration  Other issues Essential hypertension On Norvasc and Toprol RLS (restless legs syndrome) Continue Requip Depression with anxiety Stable on Remeron Off LExapro at her request Gait abnormality Uses Power Chair now Osteoarthritis of multiple joints, unspecified osteoarthritis type Tylenol  Anemia HGB normal now    Family/ staff Communication:   Labs/tests ordered:

## 2020-11-28 ENCOUNTER — Encounter: Payer: Self-pay | Admitting: Nurse Practitioner

## 2020-11-28 DIAGNOSIS — N390 Urinary tract infection, site not specified: Secondary | ICD-10-CM

## 2020-11-28 HISTORY — DX: Urinary tract infection, site not specified: N39.0

## 2020-12-01 DIAGNOSIS — L821 Other seborrheic keratosis: Secondary | ICD-10-CM | POA: Diagnosis not present

## 2020-12-01 DIAGNOSIS — D044 Carcinoma in situ of skin of scalp and neck: Secondary | ICD-10-CM | POA: Diagnosis not present

## 2020-12-01 DIAGNOSIS — L814 Other melanin hyperpigmentation: Secondary | ICD-10-CM | POA: Diagnosis not present

## 2020-12-01 DIAGNOSIS — L570 Actinic keratosis: Secondary | ICD-10-CM | POA: Diagnosis not present

## 2020-12-09 ENCOUNTER — Encounter: Payer: Self-pay | Admitting: Nurse Practitioner

## 2020-12-09 ENCOUNTER — Non-Acute Institutional Stay (SKILLED_NURSING_FACILITY): Payer: PPO | Admitting: Nurse Practitioner

## 2020-12-09 DIAGNOSIS — F418 Other specified anxiety disorders: Secondary | ICD-10-CM | POA: Diagnosis not present

## 2020-12-09 DIAGNOSIS — F5105 Insomnia due to other mental disorder: Secondary | ICD-10-CM | POA: Diagnosis not present

## 2020-12-09 DIAGNOSIS — I10 Essential (primary) hypertension: Secondary | ICD-10-CM | POA: Diagnosis not present

## 2020-12-09 DIAGNOSIS — G2581 Restless legs syndrome: Secondary | ICD-10-CM | POA: Diagnosis not present

## 2020-12-09 DIAGNOSIS — M25561 Pain in right knee: Secondary | ICD-10-CM | POA: Diagnosis not present

## 2020-12-09 DIAGNOSIS — M8949 Other hypertrophic osteoarthropathy, multiple sites: Secondary | ICD-10-CM

## 2020-12-09 DIAGNOSIS — M25571 Pain in right ankle and joints of right foot: Secondary | ICD-10-CM | POA: Diagnosis not present

## 2020-12-09 DIAGNOSIS — M79661 Pain in right lower leg: Secondary | ICD-10-CM | POA: Diagnosis not present

## 2020-12-09 DIAGNOSIS — M159 Polyosteoarthritis, unspecified: Secondary | ICD-10-CM

## 2020-12-09 DIAGNOSIS — I739 Peripheral vascular disease, unspecified: Secondary | ICD-10-CM

## 2020-12-09 DIAGNOSIS — K5901 Slow transit constipation: Secondary | ICD-10-CM | POA: Diagnosis not present

## 2020-12-09 DIAGNOSIS — M150 Primary generalized (osteo)arthritis: Secondary | ICD-10-CM

## 2020-12-09 NOTE — Assessment & Plan Note (Signed)
Her mood is stable, on Escitalopram 5mg qd, Mirtazapine 7.5mg qd.TSH 1.66 09/20/20 

## 2020-12-09 NOTE — Assessment & Plan Note (Addendum)
c/o the right knee, tibia/fibula, and ankle pain, no apparent injury, mild swelling, some redness. Able to move toes w/o pain.   OA chronic right knee pain, right ankle turned lateral deformity, no ambulatory, mechanical lift for transfer, w/c for mobility.takes Tylenol.  Limited ROM of the right leg until X-ray R knee, tibia/fibula, ankle to r/o fx or dislocation  12/09/20 X-ray anterior subluxation of the tibial portion of the knee prosthesis, large R knee effusion. Pending Dr. Sabra Heck, the patient's HPOA further decision.

## 2020-12-09 NOTE — Assessment & Plan Note (Signed)
Constipation, stable, on Bisacodyl suppository qod, Colace qd, Senokot S II qd.

## 2020-12-09 NOTE — Assessment & Plan Note (Signed)
Edema BLE, R>L, at her baseline, minimal.

## 2020-12-09 NOTE — Assessment & Plan Note (Signed)
maintained on Ropinirole 1mg qd  

## 2020-12-09 NOTE — Assessment & Plan Note (Signed)
takes Metoprolol 50mg bid, Amlodipine 2.5mg qd.Bun/creat 26/1.1 12/21  

## 2020-12-09 NOTE — Progress Notes (Addendum)
Location:    [facility] Room Number: 1 Place of Service:  SNF (31) Provider: Marlana Latus NP  Mast, Man X, NP  Patient Care Team: Mast, Man X, NP as PCP - General (Internal Medicine) Wardell Honour, MD as Attending Physician (Family Medicine)  Extended Emergency Contact Information Primary Emergency Contact: [facility] Address: [address]          [place] [address] [place] Phone: [phone] Mobile Phone: [phone] Relation: Son Secondary Emergency Contact: [facility] Address: [address]          [place], [place] [address] [place] Phone: [phone] Relation: Son  Code Status:  DNR Managed Care Goals of care: Advanced Directive information Advanced Directives 10/03/2020  Does Patient Have a Medical Advance Directive? Yes  Type of Paramedic of [place];Living will;Out of facility DNR (pink MOST or yellow form)  Does patient want to make changes to medical advance directive? No - Patient declined  Copy of [place] in Chart? Yes - validated most recent copy scanned in chart (See row information)  Would patient like information on creating a medical advance directive? -  Pre-existing out of facility DNR order (yellow form or pink MOST form) Yellow form placed in chart (order not valid for inpatient use);Pink MOST form placed in chart (order not valid for inpatient use)     Chief Complaint  Patient presents with  . Acute Visit    Right leg pain    HPI:  Pt is a 87 y.o. female seen today for an acute visit for c/o the right knee, tibia/fibula, and ankle pain, no apparent injury, mild swelling, some redness. Able to move toes w/o pain.   OA chronic right knee pain, right ankle turned lateral deformity, no ambulatory, mechanical lift for transfer, w/c for mobility.takes Tylenol. Constipation, stable, on Bisacodyl suppository qod, Colace  qd, Senokot S II qd.  Her mood is stable, on Escitalopram 5mg  qd, Mirtazapine 7.5mg  qd.TSH 1.66 09/20/20 RLS, maintained on Ropinirole 1mg  qd HTN,takes Metoprolol 50mg  bid, Amlodipine 2.5mg  qd.Bun/creat 26/1.1 12/21             Edema BLE, R>L, at her baseline, minimal.    Past Medical History:  Diagnosis Date  . Anemia   . Hypertension   . OA (osteoarthritis)   . Osteoporosis   . Renal cyst    Past Surgical History:  Procedure Laterality Date  . APPENDECTOMY    . BLADDER SUSPENSION    . CATARACT EXTRACTION    . CESAREAN SECTION    . REPLACEMENT TOTAL KNEE  1995  . TONSILLECTOMY      No Known Allergies  Allergies as of 12/09/2020   No Known Allergies     Medication List       Accurate as of December 09, 2020 11:59 PM. If you have any questions, ask your nurse or doctor.        acetaminophen 325 MG tablet Commonly known as: TYLENOL Take 650 mg by mouth 4 (four) times daily.   amLODipine 2.5 MG tablet Commonly known as: NORVASC Take 2.5 mg by mouth daily.   aspirin EC 81 MG tablet Take 81 mg by mouth. Once A Morning on Mon, Thu   Biotin 5000 MCG Tabs Take 5,000 mcg by mouth daily.   bisacodyl 10 MG suppository Commonly known as: DULCOLAX Place 10 mg rectally every other day.   docusate sodium 100 MG capsule Commonly  known as: COLACE Take 100 mg by mouth daily.   fluorouracil 5 % cream Commonly known as: EFUDEX Apply topically 2 (two) times daily.   KETOCONAZOLE (TOPICAL) 1 % Sham Apply 1 application topically.   metoprolol tartrate 50 MG tablet Commonly known as: LOPRESSOR TAKE 1 TABLET BY MOUTH TWICE DAILY.   mineral oil-hydrophilic petrolatum ointment Apply topically as needed for dry skin.   mirtazapine 7.5 MG tablet Commonly known as: REMERON TAKE ONE TABLET AT BEDTIME.   multivitamin-lutein Caps capsule Take 1 capsule by mouth 2 (two) times daily.   rOPINIRole 1 MG tablet Commonly known as:  REQUIP TAKE 1 TABLET EACH EVENING.   sennosides-docusate sodium 8.6-50 MG tablet Commonly known as: SENOKOT-S Take 2 tablets by mouth daily.   Vitamin D 50 MCG (2000 UT) Caps Take 2,000 Units by mouth daily.   zinc oxide 20 % ointment Apply 1 application topically as needed for irritation.       Review of Systems  Constitutional: Negative for activity change, appetite change and fever.       #2-3Ibs weight gained in the past 2-3 weeks, no apparent fluid retention.   HENT: Positive for hearing loss. Negative for congestion and voice change.   Eyes: Negative for visual disturbance.  Respiratory: Negative for cough.   Cardiovascular: Positive for leg swelling.  Gastrointestinal: Negative for abdominal pain and constipation.  Genitourinary: Negative for dysuria, hematuria and urgency.       Incontinent of urine.   Musculoskeletal: Positive for arthralgias and gait problem.       Multiple sites, mostly right knee, right ankle turned laterally, uses soft brace for knee support.   Skin: Negative for color change.       Ecchymoses/bruise  Neurological: Negative for speech difficulty, weakness and light-headedness.       BLE feels like "cardboard"  Psychiatric/Behavioral: Negative for behavioral problems and sleep disturbance. The patient is not nervous/anxious.     Immunization History  Administered Date(s) Administered  . Influenza, High Dose Seasonal PF 07/10/2017, 07/15/2019, 07/05/2020  . Influenza,inj,Quad PF,6+ Mos 07/03/2018  . Influenza-Unspecified 07/10/2017  . Moderna Sars-Covid-2 Vaccination 10/05/2019, 11/02/2019, 08/15/2020  . Pneumococcal Conjugate-13 08/18/2014  . Pneumococcal Polysaccharide-23 07/26/2010  . Tdap 11/12/2018   Pertinent  Health Maintenance Due  Topic Date Due  . INFLUENZA VACCINE  Completed  . PNA vac Low Risk Adult  Completed  . DEXA SCAN  Discontinued   Fall Risk  08/12/2019 07/23/2019 07/15/2019 04/15/2019 09/02/2018  Falls in the past year?  0 0 0 0 0  Number falls in past yr: 0 0 0 0 0  Injury with Fall? - 0 - 0 0   Functional Status Survey:    Vitals:   12/09/20 1049  BP: 120/77  Pulse: 72  Resp: 20  Temp: 98 F (36.7 C)  SpO2: 92%  Weight: 127 lb (57.6 kg)  Height: 5' (1.524 m)   Body mass index is 24.8 kg/m. Physical Exam Vitals and nursing note reviewed.  Constitutional:      Appearance: Normal appearance.  HENT:     Head: Normocephalic and atraumatic.     Mouth/Throat:     Mouth: Mucous membranes are moist.  Eyes:     Extraocular Movements: Extraocular movements intact.     Conjunctiva/sclera: Conjunctivae normal.     Pupils: Pupils are equal, round, and reactive to light.  Cardiovascular:     Rate and Rhythm: Normal rate and regular rhythm.     Heart sounds: Murmur heard.  Comments: The R DP pulse was not felt.  Pulmonary:     Effort: Pulmonary effort is normal.     Breath sounds: Rales present.     Comments: R basilar rales.  Abdominal:     General: Bowel sounds are normal.     Palpations: Abdomen is soft.     Tenderness: There is no abdominal tenderness.  Musculoskeletal:     Cervical back: Normal range of motion and neck supple.     Right lower leg: Edema present.     Left lower leg: Edema present.     Comments: Trace edema BLE, R>L. Pain in the right knee, tibia/fibula, and ankle with movement. Palpated tenderness anterior below the right knee.   Skin:    General: Skin is warm and dry.     Findings: Bruising present.     Comments: Right thigh bruise is healing slowly. New ecchymoses left forearm.   Neurological:     General: No focal deficit present.     Mental Status: She is alert and oriented to person, place, and time. Mental status is at baseline.     Gait: Gait abnormal.  Psychiatric:        Mood and Affect: Mood normal.        Behavior: Behavior normal.        Thought Content: Thought content normal.     Labs reviewed: Recent Labs    03/17/20 0000 09/20/20 0000   NA 141 140  K 4.0 4.0  CL 105 105  CO2 30* 29*  BUN 22* 26*  CREATININE 0.9 1.1  CALCIUM 8.8 9.2   Recent Labs    03/17/20 0000 09/20/20 0000  AST 14 13  ALT 10 6*  ALKPHOS 65 61  ALBUMIN 3.2* 3.5   Recent Labs    03/17/20 0000 09/20/20 0000  WBC 5.0 6.1  NEUTROABS 1,905 2,879.00  HGB 12.5 12.2  HCT 37 36  PLT 180 207   Lab Results  Component Value Date   TSH 1.66 09/20/2020   No results found for: HGBA1C Lab Results  Component Value Date   CHOL 175 07/23/2019   HDL 54 07/23/2019   LDLCALC 105 (H) 07/23/2019   TRIG 74 07/23/2019   CHOLHDL 3.2 07/23/2019    Significant Diagnostic Results in last 30 days:  No results found.  Assessment/Plan Osteoarthritis of multiple joints  c/o the right knee, tibia/fibula, and ankle pain, no apparent injury, mild swelling, some redness. Able to move toes w/o pain.   OA chronic right knee pain, right ankle turned lateral deformity, no ambulatory, mechanical lift for transfer, w/c for mobility.takes Tylenol.  Limited ROM of the right leg until X-ray R knee, tibia/fibula, ankle to r/o fx or dislocation  12/09/20 X-ray anterior subluxation of the tibial portion of the knee prosthesis, large R knee effusion. Pending Dr. Sabra Heck, the patient's HPOA further decision.    Slow transit constipation Constipation, stable, on Bisacodyl suppository qod, Colace qd, Senokot S II qd.    Insomnia secondary to depression with anxiety Her mood is stable, on Escitalopram 5mg  qd, Mirtazapine 7.5mg  qd.TSH 1.66 09/20/20   RLS (restless legs syndrome) maintained on Ropinirole 1mg  qd  Essential hypertension takes Metoprolol 50mg  bid, Amlodipine 2.5mg  qd.Bun/creat 26/1.1 12/21  PVD (peripheral vascular disease) (HCC) Edema BLE, R>L, at her baseline, minimal.     Family/ staff Communication: plan of care reviewed with the patient and charge nurse.   Labs/tests ordered: X-ray 3 views R knee, tibia/fibula, ankle  Time  spend 25  minutes.

## 2020-12-23 ENCOUNTER — Non-Acute Institutional Stay (SKILLED_NURSING_FACILITY): Payer: PPO | Admitting: Nurse Practitioner

## 2020-12-23 ENCOUNTER — Encounter: Payer: Self-pay | Admitting: Nurse Practitioner

## 2020-12-23 DIAGNOSIS — I739 Peripheral vascular disease, unspecified: Secondary | ICD-10-CM

## 2020-12-23 DIAGNOSIS — K5901 Slow transit constipation: Secondary | ICD-10-CM

## 2020-12-23 DIAGNOSIS — F5105 Insomnia due to other mental disorder: Secondary | ICD-10-CM

## 2020-12-23 DIAGNOSIS — I10 Essential (primary) hypertension: Secondary | ICD-10-CM

## 2020-12-23 DIAGNOSIS — M8949 Other hypertrophic osteoarthropathy, multiple sites: Secondary | ICD-10-CM

## 2020-12-23 DIAGNOSIS — F418 Other specified anxiety disorders: Secondary | ICD-10-CM

## 2020-12-23 DIAGNOSIS — G2581 Restless legs syndrome: Secondary | ICD-10-CM

## 2020-12-23 DIAGNOSIS — M159 Polyosteoarthritis, unspecified: Secondary | ICD-10-CM

## 2020-12-23 NOTE — Assessment & Plan Note (Signed)
takes Metoprolol 50mg  bid, Amlodipine 2.5mg  qd.Bun/creat 26/1.1 12/21

## 2020-12-23 NOTE — Progress Notes (Signed)
Location:   [facility] Room Number: 1/A Place of Service:  SNF (31) Provider: Lennie Odor Jashay Roddy NP  Inocencio Roy X, NP  Patient Care Team: Aniruddh Ciavarella X, NP as PCP - General (Internal Medicine) Wardell Honour, MD as Attending Physician (Family Medicine)  Extended Emergency Contact Information Primary Emergency Contact: [facility] Address: [address] [place] [address] [place] Phone: [phone] Mobile Phone: [phone] Relation: Son Secondary Emergency Contact: [facility] Address: [address]          [place], [place] [address] [place] Phone: [phone] Relation: Son  Code Status:  DNR Goals of care: Advanced Directive information Advanced Directives 12/23/2020  Does Patient Have a Medical Advance Directive? Yes  Type of Paramedic of [place];Living will;Out of facility DNR (pink MOST or yellow form)  Does patient want to make changes to medical advance directive? No - Patient declined  Copy of [place] in Chart? Yes - validated most recent copy scanned in chart (See row information)  Would patient like information on creating a medical advance directive? -  Pre-existing out of facility DNR order (yellow form or pink MOST form) Yellow form placed in chart (order not valid for inpatient use);Pink MOST form placed in chart (order not valid for inpatient use)     Chief Complaint  Patient presents with  . Medical Management of Chronic Issues    Routine Visit of Medical Management     HPI:  Pt is a 87 y.o. female seen today for medical management of chronic diseases.     OA chronic right knee pain, right ankle turned lateral deformity, no ambulatory, mechanical lift for transfer, w/c for mobility.takes Tylenol.12/09/20 X-ray anterior subluxation of the tibial portion of the knee prosthesis, large R knee effusion. Dr. Sabra Heck, the patient's HPOA is aware of it.   Constipation, stable, on Bisacodyl suppository qod, Colace qd, Senokot S II qd.  Her mood is stable, on Escitalopram 5mg  qd, Mirtazapine 7.5mg  qd.TSH 1.66 09/20/20 RLS, maintained on Ropinirole 1mg  qd HTN,takes Metoprolol 50mg  bid, Amlodipine 2.5mg  qd.Bun/creat 26/1.1 12/21 Edema BLE, R>L, at her baseline, minimal  Past Medical History:  Diagnosis Date  . Anemia   . Hypertension   . OA (osteoarthritis)   . Osteoporosis   . Renal cyst    Past Surgical History:  Procedure Laterality Date  . APPENDECTOMY    . BLADDER SUSPENSION    . CATARACT EXTRACTION    . CESAREAN SECTION    . REPLACEMENT TOTAL KNEE  1995  . TONSILLECTOMY      No Known Allergies  Allergies as of 12/23/2020   No Known Allergies     Medication List       Accurate as of December 23, 2020 11:59 PM. If you have any questions, ask your nurse or doctor.        STOP taking these medications   fluorouracil 5 % cream Commonly known as: EFUDEX Stopped by: Dravin Lance X Eyonna Sandstrom, NP     TAKE these medications   acetaminophen 325 MG tablet Commonly known as: TYLENOL Take 650 mg by mouth every 4 (four) hours as needed.   amLODipine 2.5 MG tablet Commonly known as: NORVASC Take 2.5 mg by mouth daily.   aspirin EC 81 MG tablet Take 81 mg by mouth. Once A Morning on Mon, Thu   Biotin 5000 MCG Tabs Take 5,000 mcg by mouth  daily.   bisacodyl 10 MG suppository Commonly known as: DULCOLAX Place 10 mg rectally every other day.   bisacodyl 10 MG suppository Commonly known as: DULCOLAX Place 10 mg rectally daily as needed.   docusate sodium 100 MG capsule Commonly known as: COLACE Take 100 mg by mouth daily.   KETOCONAZOLE (TOPICAL) 1 % Sham Apply 1 application topically. Special Instructions: wash hair 2x a week   magnesium hydroxide 400 MG/5ML suspension Commonly known as: MILK OF MAGNESIA Take 30 mLs by mouth daily as needed for mild  constipation.   metoprolol tartrate 50 MG tablet Commonly known as: LOPRESSOR TAKE 1 TABLET BY MOUTH TWICE DAILY.   mineral oil-hydrophilic petrolatum ointment Apply 1 application topically as needed. Special Instructions: Apply Aquaphor 41% to scalp biopsy site as needed per resident's wishes.( No longer daily).   mirtazapine 7.5 MG tablet Commonly known as: REMERON TAKE ONE TABLET AT BEDTIME.   multivitamin-lutein Caps capsule Take 1 capsule by mouth 2 (two) times daily.   rOPINIRole 1 MG tablet Commonly known as: REQUIP TAKE 1 TABLET EACH EVENING.   sennosides-docusate sodium 8.6-50 MG tablet Commonly known as: SENOKOT-S Take 2 tablets by mouth daily.   Vitamin D 50 MCG (2000 UT) Caps Take 2,000 Units by mouth daily.   zinc oxide 20 % ointment Apply 1 application topically as needed for irritation. Special Instructions: To buttocks after every incontinent episode and as needed for redness. May keep at bedside.       Review of Systems  Constitutional: Negative for appetite change, fatigue, fever and unexpected weight change.       #2-3Ibs weight gained in the past 2-3 weeks, no apparent fluid retention.   HENT: Positive for hearing loss. Negative for congestion and voice change.   Eyes: Negative for visual disturbance.  Respiratory: Negative for cough.   Cardiovascular: Positive for leg swelling.  Gastrointestinal: Positive for constipation. Negative for abdominal pain.  Genitourinary: Negative for dysuria, hematuria and urgency.       Incontinent of urine.   Musculoskeletal: Positive for arthralgias and gait problem.       Multiple sites, mostly right knee, right ankle turned laterally, uses soft brace.   Skin: Negative for color change.       Ecchymoses/bruise  Neurological: Negative for speech difficulty, weakness and light-headedness.       BLE feels like "cardboard"  Psychiatric/Behavioral: Negative for behavioral problems and sleep disturbance. The patient is  not nervous/anxious.     Immunization History  Administered Date(s) Administered  . Influenza, High Dose Seasonal PF 07/10/2017, 07/15/2019, 07/05/2020  . Influenza,inj,Quad PF,6+ Mos 07/03/2018  . Influenza-Unspecified 07/10/2017  . Moderna Sars-Covid-2 Vaccination 10/05/2019, 11/02/2019, 08/15/2020  . Pneumococcal Conjugate-13 08/18/2014  . Pneumococcal Polysaccharide-23 07/26/2010  . Tdap 11/12/2018   Pertinent  Health Maintenance Due  Topic Date Due  . INFLUENZA VACCINE  Completed  . PNA vac Low Risk Adult  Completed  . DEXA SCAN  Discontinued   Fall Risk  08/12/2019 07/23/2019 07/15/2019 04/15/2019 09/02/2018  Falls in the past year? 0 0 0 0 0  Number falls in past yr: 0 0 0 0 0  Injury with Fall? - 0 - 0 0   Functional Status Survey:    Vitals:   12/23/20 1616  BP: 131/78  Pulse: 68  Resp: 20  Temp: (!) 96.9 F (36.1 C)  SpO2: 91%  Weight: 127 lb 9.6 oz (57.9 kg)  Height: 5' (1.524 m)   Body mass index is 24.92 kg/m.  Physical Exam Vitals and nursing note reviewed.  Constitutional:      Appearance: Normal appearance.  HENT:     Head: Normocephalic and atraumatic.     Mouth/Throat:     Mouth: Mucous membranes are moist.  Eyes:     Extraocular Movements: Extraocular movements intact.     Conjunctiva/sclera: Conjunctivae normal.     Pupils: Pupils are equal, round, and reactive to light.  Cardiovascular:     Rate and Rhythm: Normal rate and regular rhythm.     Heart sounds: Murmur heard.      Comments: The R DP pulse was not felt.  Pulmonary:     Effort: Pulmonary effort is normal.     Breath sounds: Rales present.     Comments: R basilar rales.  Abdominal:     General: Bowel sounds are normal.     Palpations: Abdomen is soft.     Tenderness: There is no abdominal tenderness.  Musculoskeletal:     Cervical back: Normal range of motion and neck supple.     Right lower leg: Edema present.     Left lower leg: Edema present.     Comments: Trace edema  BLE, R>L. Pain in the right knee with ROM is chronic.   Skin:    General: Skin is warm and dry.     Findings: Bruising present.     Comments: Right thigh bruise is healing slowly. New ecchymoses left forearm.   Neurological:     General: No focal deficit present.     Mental Status: She is alert and oriented to person, place, and time. Mental status is at baseline.     Gait: Gait abnormal.  Psychiatric:        Mood and Affect: Mood normal.        Behavior: Behavior normal.        Thought Content: Thought content normal.     Labs reviewed: Recent Labs    03/17/20 0000 09/20/20 0000  NA 141 140  K 4.0 4.0  CL 105 105  CO2 30* 29*  BUN 22* 26*  CREATININE 0.9 1.1  CALCIUM 8.8 9.2   Recent Labs    03/17/20 0000 09/20/20 0000  AST 14 13  ALT 10 6*  ALKPHOS 65 61  ALBUMIN 3.2* 3.5   Recent Labs    03/17/20 0000 09/20/20 0000  WBC 5.0 6.1  NEUTROABS 1,905 2,879.00  HGB 12.5 12.2  HCT 37 36  PLT 180 207   Lab Results  Component Value Date   TSH 1.66 09/20/2020   No results found for: HGBA1C Lab Results  Component Value Date   CHOL 175 07/23/2019   HDL 54 07/23/2019   LDLCALC 105 (H) 07/23/2019   TRIG 74 07/23/2019   CHOLHDL 3.2 07/23/2019    Significant Diagnostic Results in last 30 days:  No results found.  Assessment/Plan Insomnia secondary to depression with anxiety Her mood is stable, on Escitalopram 5mg  qd, Mirtazapine 7.5mg  qd.TSH 1.66 09/20/20  RLS (restless legs syndrome) maintained on Ropinirole 1mg  qd   Essential hypertension takes Metoprolol 50mg  bid, Amlodipine 2.5mg  qd.Bun/creat 26/1.1 12/21   PVD (peripheral vascular disease) (HCC) R>L, at her baseline, minimal   Slow transit constipation constipated, on Bisacodyl suppository qod/aading prn, Colace qd, Senokot S II qd. Adding prn MOM  Osteoarthritis of multiple joints OA chronic right knee pain, right ankle turned lateral deformity, no ambulatory, mechanical lift for  transfer, w/c for mobility.takes Tylenol.12/09/20 X-ray anterior subluxation of the tibial  portion of the knee prosthesis, large R knee effusion. Dr. Sabra Heck, the patient's HPOA is aware of it.    Family/ staff Communication: plan of care reviewed with the patient and charge nurse.   Labs/tests ordered:  none  Time spend 25 minutes

## 2020-12-23 NOTE — Assessment & Plan Note (Signed)
R>L, at her baseline, minimal

## 2020-12-23 NOTE — Assessment & Plan Note (Signed)
maintained on Ropinirole 1mg  qd

## 2020-12-23 NOTE — Assessment & Plan Note (Signed)
Her mood is stable, on Escitalopram 5mg  qd, Mirtazapine 7.5mg  qd.TSH 1.66 09/20/20

## 2020-12-23 NOTE — Assessment & Plan Note (Addendum)
constipated, on Bisacodyl suppository qod/aading prn, Colace qd, Senokot S II qd. Adding prn MOM

## 2020-12-23 NOTE — Assessment & Plan Note (Signed)
OA chronic right knee pain, right ankle turned lateral deformity, no ambulatory, mechanical lift for transfer, w/c for mobility.takes Tylenol.12/09/20 X-ray anterior subluxation of the tibial portion of the knee prosthesis, large R knee effusion. Dr. Sabra Heck, the patient's HPOA is aware of it.

## 2020-12-27 ENCOUNTER — Encounter: Payer: Self-pay | Admitting: Nurse Practitioner

## 2021-01-03 ENCOUNTER — Other Ambulatory Visit: Payer: Self-pay

## 2021-01-03 ENCOUNTER — Non-Acute Institutional Stay: Payer: PPO | Admitting: Student

## 2021-01-03 DIAGNOSIS — M153 Secondary multiple arthritis: Secondary | ICD-10-CM | POA: Diagnosis not present

## 2021-01-03 DIAGNOSIS — Z515 Encounter for palliative care: Secondary | ICD-10-CM | POA: Diagnosis not present

## 2021-01-03 DIAGNOSIS — K5901 Slow transit constipation: Secondary | ICD-10-CM | POA: Diagnosis not present

## 2021-01-03 NOTE — Progress Notes (Signed)
[facility] Consult Note Telephone: [phone]  Fax: [phone]  PATIENT NAME: Ann Jensen [id number]  [place] [address] [phone] (home)  DOB: 03-Dec-1921 MRN: [id number]  PRIMARY CARE PROVIDER:    Mast, Man X, NP,  [address] [address] [phone]  REFERRING PROVIDER:   Mast, Man X, NP [address],  [place] [address] [phone]  RESPONSIBLE PARTY:   Extended Emergency Contact Information Primary Emergency Contact: [facility] Address: [address] [address] [place] Phone: [phone] Mobile Phone: [phone] Relation: Son Secondary Emergency Contact: Fishbaugh,Garth Address: [address]          [place], [place] [address] [place] Phone: [phone] Relation: Son  I met face to face with patient in the facility.   ASSESSMENT AND RECOMMENDATIONS:   Advance Care Planning: Visit at the request of Dr. Lyndel Safe for palliative consult. Visit consisted of building trust and discussions on Palliative care medicine as specialized medical care for people living with serious illness, aimed at facilitating improved quality of life through symptoms relief, assisting with advance care planning and establishing goals of care. Education provided on Palliative Medicine vs. Hospice services. Palliative care will continue to provide support to patient, family and the medical team.  Goal of care: To maintain quality of life, symptoms managed.   Directives: MOST, DNR.  Symptom Management:   Pain-patient with OA, chronic right knee pain. Recommend continuing acetaminophen 669m every 4 hours PRN pain. Motorized w/c for locomotion.   Constipation-well balanced diet with fiber encouraged, adequate fluids encouraged. Continue colace 1029mdaily, bisacodyl supp 1042mvery other day and PRN, senna-S 8.6-50mg 2 tabs QHS and  miralax 17gm daily PRN.  Follow up Palliative Care Visit: Palliative care will continue to follow for complex decision making and symptom management. Return in 8 weeks or prn.  Family /Caregiver/Community Supports: Palliative Medicine will continue to provide support.    I spent 35 minutes providing this consultation, time includes time spent with patient/family, chart review, provider coordination, and documentation. More than 50% of the time in this consultation was spent counseling and coordinating communication.   CHIEF COMPLAINT: Palliative Medicine follow up visit.   History obtained from review of EMR, discussion with primary team. Records reviewed and summarized below.  HISTORY OF PRESENT ILLNESS:  Ann Jensen a [age]o. year old female with multiple medical problems including osteoarthritis, osteoporosis, renal cyst, anemia, hypertension. Palliative Care was asked to follow this patient by consultation request of Dr. GupLyndel Safe help address advance care planning and goals of care. This is a follow up visit.  Patient reports chronic right knee pain. Recent x-ray 12/09/20 results show anterior subluxation of the tibial portion of the knee prosthesis, large R knee effusion. Patient takes acetaminophen with relief. She reports occasional constipation. Recently completed antibiotics for urinary tract infection. Denies any urinary symptoms currently.   CODE STATUS: DNR  PPS: 40%  HOSPICE ELIGIBILITY/DIAGNOSIS: TBD  ROS   General: NAD EYES: wears glasses ENMT: denies dysphagia Cardiovascular: denies chest pain Pulmonary: denies cough, denies increased SOB Abdomen: endorses fair appetite, endorses occ constipation GU: denies dysuria MSK:  endorses ROM limitations, no falls reported Skin: denies rashes or wounds Neurological: endorses weakness, denies pain, denies insomnia Psych: Endorses positive mood Heme/lymph/immuno: denies bruises, abnormal bleeding   Physical  Exam: Weight: 127.6 pounds Pulse 84,  resp 16, b/p 112/60 Constitutional: NAD General: A & O x 3, frail appearing EYES: anicteric sclera, lids intact, no discharge  ENMT: intact hearing,oral mucous membranes moist CV: RRR, 1-2+ LE edema, R > L Pulmonary: LCTA, no increased work of breathing, no cough Abdomen: BS normoactive x 4 GU: deferred MSK: non ambulatory, right knee valgus deformity Skin: warm and dry, no rashes or wounds on visible skin Neuro: Generalized weakness Psych: non anxious affect today Hem/lymph/immuno: no widespread bruising   PAST MEDICAL HISTORY:  Past Medical History:  Diagnosis Date  . Anemia   . Hypertension   . OA (osteoarthritis)   . Osteoporosis   . Renal cyst     SOCIAL HX:  Social History   Tobacco Use  . Smoking status: Former Smoker    Types: E-cigarettes  . Smokeless tobacco: Never Used  . Tobacco comment: when she was 87 years old  Substance Use Topics  . Alcohol use: No   FAMILY HX:  Family History  Problem Relation Age of Onset  . Colon cancer Mother   . Arthritis Mother   . Heart disease Father   . Heart attack Father   . Migraines Sister   . Allergies Sister   . Asthma Sister     ALLERGIES: No Known Allergies   PERTINENT MEDICATIONS:  Outpatient Encounter Medications as of 01/03/2021  Medication Sig  . acetaminophen (TYLENOL) 325 MG tablet Take 650 mg by mouth every 4 (four) hours as needed.  Marland Kitchen amLODipine (NORVASC) 2.5 MG tablet Take 2.5 mg by mouth daily.  Marland Kitchen aspirin EC 81 MG tablet Take 81 mg by mouth. Once A Morning on Mon, Thu  . Biotin 5000 MCG TABS Take 5,000 mcg by mouth daily.   . bisacodyl (DULCOLAX) 10 MG suppository Place 10 mg rectally every other day.  . bisacodyl (DULCOLAX) 10 MG suppository Place 10 mg rectally daily as needed.  . Cholecalciferol (VITAMIN D) 50 MCG (2000 UT) CAPS Take 2,000 Units by mouth daily.   Marland Kitchen docusate sodium (COLACE) 100 MG capsule Take 100 mg by mouth daily.  Marland Kitchen KETOCONAZOLE, TOPICAL, 1  % SHAM Apply 1 application topically. Special Instructions: wash hair 2x a week  . magnesium hydroxide (MILK OF MAGNESIA) 400 MG/5ML suspension Take 30 mLs by mouth daily as needed for mild constipation.  . metoprolol tartrate (LOPRESSOR) 50 MG tablet TAKE 1 TABLET BY MOUTH TWICE DAILY.  . mineral oil-hydrophilic petrolatum (AQUAPHOR) ointment Apply 1 application topically as needed. Special Instructions: Apply Aquaphor 41% to scalp biopsy site as needed per resident's wishes.( No longer daily).  . mirtazapine (REMERON) 7.5 MG tablet TAKE ONE TABLET AT BEDTIME.  . multivitamin-lutein (OCUVITE-LUTEIN) CAPS capsule Take 1 capsule by mouth 2 (two) times daily.   Marland Kitchen rOPINIRole (REQUIP) 1 MG tablet TAKE 1 TABLET EACH EVENING.  . sennosides-docusate sodium (SENOKOT-S) 8.6-50 MG tablet Take 2 tablets by mouth daily.  Marland Kitchen zinc oxide 20 % ointment Apply 1 application topically as needed for irritation. Special Instructions: To buttocks after every incontinent episode and as needed for redness. May keep at bedside.   No facility-administered encounter medications on file as of 01/03/2021.     Thank you for the opportunity to participate in the care of Ms Studer. The palliative care team will continue to follow. Please call our office at [phone] if we can be of additional assistance.  Ezekiel Slocumb, NP

## 2021-01-05 DIAGNOSIS — D044 Carcinoma in situ of skin of scalp and neck: Secondary | ICD-10-CM | POA: Diagnosis not present

## 2021-01-19 ENCOUNTER — Encounter: Payer: Self-pay | Admitting: Internal Medicine

## 2021-01-19 ENCOUNTER — Non-Acute Institutional Stay (SKILLED_NURSING_FACILITY): Payer: PPO | Admitting: Internal Medicine

## 2021-01-19 DIAGNOSIS — G2581 Restless legs syndrome: Secondary | ICD-10-CM | POA: Diagnosis not present

## 2021-01-19 DIAGNOSIS — R269 Unspecified abnormalities of gait and mobility: Secondary | ICD-10-CM

## 2021-01-19 DIAGNOSIS — M159 Polyosteoarthritis, unspecified: Secondary | ICD-10-CM

## 2021-01-19 DIAGNOSIS — I10 Essential (primary) hypertension: Secondary | ICD-10-CM

## 2021-01-19 DIAGNOSIS — K5901 Slow transit constipation: Secondary | ICD-10-CM

## 2021-01-19 DIAGNOSIS — M8949 Other hypertrophic osteoarthropathy, multiple sites: Secondary | ICD-10-CM

## 2021-01-19 NOTE — Progress Notes (Signed)
Location:    [facility] Number: 1 Place of Service:  SNF (31) Provider:  Veleta Miners MD  Mast, Man X, NP  Patient Care Team: Mast, Man X, NP as PCP - General (Internal Medicine) Wardell Honour, MD as Attending Physician (Family Medicine)  Extended Emergency Contact Information Primary Emergency Contact: [facility] Address: [place] [address] [place] Phone: [phone] Mobile Phone: [phone] Relation: Son Secondary Emergency Contact: [facility] Address: [address]          [place], [place] [address] [place] Phone: [phone] Relation: Son  Code Status:  DNR Palliative Care Managed Care Goals of care: Advanced Directive information Advanced Directives 01/19/2021  Does Patient Have a Medical Advance Directive? Yes  Type of Paramedic of [place];Living will;Out of facility DNR (pink MOST or yellow form)  Does patient want to make changes to medical advance directive? -  Copy of [place] in Chart? Yes - validated most recent copy scanned in chart (See row information)  Would patient like information on creating a medical advance directive? -  Pre-existing out of facility DNR order (yellow form or pink MOST form) Yellow form placed in chart (order not valid for inpatient use);Pink MOST form placed in chart (order not valid for inpatient use)     Chief Complaint  Patient presents with  . Medical Management of Chronic Issues    HPI:  Pt is a 87 y.o. female seen today for medical management of chronic diseases.    Patient has h/o Hypertension, Anxiety, Restless leg Syndrome, Insomnia, Arthritis and Posterior Tendon Dysfunction with right foot Valgus in Right Knee with inability toAmbulate  Doing well. Weight is stable Dependent for her transfers Uses her Electric chair Mood is good No New nursing issues Did have some  redness in her Left Eye but is planning to go to Ophthalmologist for follow up  Past Medical History:  Diagnosis Date  . Anemia   . Hypertension   . OA (osteoarthritis)   . Osteoporosis   . Renal cyst    Past Surgical History:  Procedure Laterality Date  . APPENDECTOMY    . BLADDER SUSPENSION    . CATARACT EXTRACTION    . CESAREAN SECTION    . REPLACEMENT TOTAL KNEE  1995  . TONSILLECTOMY      No Known Allergies  Allergies as of 01/19/2021   No Known Allergies     Medication List       Accurate as of January 19, 2021  4:39 PM. If you have any questions, ask your nurse or doctor.        acetaminophen 325 MG tablet Commonly known as: TYLENOL Take 650 mg by mouth every 4 (four) hours as needed.   amLODipine 2.5 MG tablet Commonly known as: NORVASC Take 2.5 mg by mouth daily.   aspirin EC 81 MG tablet Take 81 mg by mouth. Once A Morning on Mon, Thu   Biotin 5000 MCG Tabs Take 5,000 mcg by mouth daily.   bisacodyl 10 MG suppository Commonly known as: DULCOLAX Place 10 mg rectally every other day.   bisacodyl 10 MG suppository Commonly known as: DULCOLAX Place 10 mg rectally daily as needed.   docusate sodium 100 MG capsule Commonly known as: COLACE Take 100 mg by mouth daily.   KETOCONAZOLE (TOPICAL) 1 % Sham Apply 1 application topically. Special  Instructions: wash hair 2x a week   magnesium hydroxide 400 MG/5ML suspension Commonly known as: MILK OF MAGNESIA Take 30 mLs by mouth daily as needed for mild constipation.   metoprolol tartrate 50 MG tablet Commonly known as: LOPRESSOR TAKE 1 TABLET BY MOUTH TWICE DAILY.   mineral oil-hydrophilic petrolatum ointment Apply 1 application topically as needed. Special Instructions: Apply Aquaphor 41% to scalp biopsy site as needed per resident's wishes.( No longer daily).   mirtazapine 7.5 MG tablet Commonly known as: REMERON TAKE ONE TABLET AT BEDTIME.   multivitamin-lutein Caps capsule Take 1 capsule by  mouth 2 (two) times daily.   polyethylene glycol 17 g packet Commonly known as: MIRALAX / GLYCOLAX Take 17 g by mouth daily as needed.   rOPINIRole 1 MG tablet Commonly known as: REQUIP TAKE 1 TABLET EACH EVENING.   sennosides-docusate sodium 8.6-50 MG tablet Commonly known as: SENOKOT-S Take 2 tablets by mouth daily.   Vitamin D 50 MCG (2000 UT) Caps Take 2,000 Units by mouth daily.   zinc oxide 20 % ointment Apply 1 application topically as needed for irritation. Special Instructions: To buttocks after every incontinent episode and as needed for redness. May keep at bedside.       Review of Systems  Constitutional: Negative.   HENT: Negative.   Eyes: Positive for discharge and redness.  Respiratory: Negative.   Cardiovascular: Positive for leg swelling.  Gastrointestinal: Positive for constipation.  Musculoskeletal: Positive for arthralgias and gait problem.  Skin: Negative.   Neurological: Negative for dizziness.  Psychiatric/Behavioral: Negative.     Immunization History  Administered Date(s) Administered  . Influenza, High Dose Seasonal PF 07/10/2017, 07/15/2019, 07/05/2020  . Influenza,inj,Quad PF,6+ Mos 07/03/2018  . Influenza-Unspecified 07/10/2017  . Moderna Sars-Covid-2 Vaccination 10/05/2019, 11/02/2019, 08/15/2020  . Pneumococcal Conjugate-13 08/18/2014  . Pneumococcal Polysaccharide-23 07/26/2010  . Tdap 11/12/2018   Pertinent  Health Maintenance Due  Topic Date Due  . INFLUENZA VACCINE  05/01/2021  . PNA vac Low Risk Adult  Completed  . DEXA SCAN  Discontinued   Fall Risk  08/12/2019 07/23/2019 07/15/2019 04/15/2019 09/02/2018  Falls in the past year? 0 0 0 0 0  Number falls in past yr: 0 0 0 0 0  Injury with Fall? - 0 - 0 0   Functional Status Survey:    Vitals:   01/19/21 1630  BP: 133/64  Pulse: 74  Temp: 97.8 F (36.6 C)  SpO2: 94%  Weight: 127 lb 6.4 oz (57.8 kg)  Height: 5' (1.524 m)   Body mass index is 24.88 kg/m. Physical  Exam Vitals reviewed.  Constitutional:      Appearance: Normal appearance.  HENT:     Head: Normocephalic.     Nose: Nose normal.     Mouth/Throat:     Pharynx: Oropharynx is clear.  Eyes:     Pupils: Pupils are equal, round, and reactive to light.     Comments: Some redness in Left Eye  Cardiovascular:     Rate and Rhythm: Normal rate.     Pulses: Normal pulses.  Pulmonary:     Effort: Pulmonary effort is normal.     Breath sounds: Normal breath sounds.  Abdominal:     General: Abdomen is flat. Bowel sounds are normal.     Palpations: Abdomen is soft.  Musculoskeletal:     Cervical back: Neck supple.     Comments: Trace swelling Bilateral  Skin:    General: Skin is warm.  Neurological:  Mental Status: She is alert and oriented to person, place, and time.  Psychiatric:        Mood and Affect: Mood normal.        Thought Content: Thought content normal.     Labs reviewed: Recent Labs    03/17/20 0000 09/20/20 0000  NA 141 140  K 4.0 4.0  CL 105 105  CO2 30* 29*  BUN 22* 26*  CREATININE 0.9 1.1  CALCIUM 8.8 9.2   Recent Labs    03/17/20 0000 09/20/20 0000  AST 14 13  ALT 10 6*  ALKPHOS 65 61  ALBUMIN 3.2* 3.5   Recent Labs    03/17/20 0000 09/20/20 0000  WBC 5.0 6.1  NEUTROABS 1,905 2,879.00  HGB 12.5 12.2  HCT 37 36  PLT 180 207   Lab Results  Component Value Date   TSH 1.66 09/20/2020   No results found for: HGBA1C Lab Results  Component Value Date   CHOL 175 07/23/2019   HDL 54 07/23/2019   LDLCALC 105 (H) 07/23/2019   TRIG 74 07/23/2019   CHOLHDL 3.2 07/23/2019    Significant Diagnostic Results in last 30 days:  No results found.  Assessment/Plan Essential hypertension On Norvasc and Toprol  RLS (restless legs syndrome) On Low dose of Requip  Slow transit constipation Continue PRN Laxative as does not like then Sche[place]uled  Primary osteoarthritis involving multiple joints On Tylenol  Gait abnormality Uses Power  chair  Insomnia On Remeron Left Eye redness Did not want me to write for any Drops Waiting to see Ophthalmologist LE edema Wants to try Ted hoses  Family/ staff Communication:   Labs/tests ordered:

## 2021-01-23 DIAGNOSIS — H04123 Dry eye syndrome of bilateral lacrimal glands: Secondary | ICD-10-CM | POA: Diagnosis not present

## 2021-02-02 DIAGNOSIS — Z85828 Personal history of other malignant neoplasm of skin: Secondary | ICD-10-CM | POA: Diagnosis not present

## 2021-02-02 DIAGNOSIS — L570 Actinic keratosis: Secondary | ICD-10-CM | POA: Diagnosis not present

## 2021-02-13 ENCOUNTER — Non-Acute Institutional Stay (SKILLED_NURSING_FACILITY): Payer: PPO | Admitting: Orthopedic Surgery

## 2021-02-13 ENCOUNTER — Encounter: Payer: Self-pay | Admitting: Orthopedic Surgery

## 2021-02-13 DIAGNOSIS — I10 Essential (primary) hypertension: Secondary | ICD-10-CM

## 2021-02-13 DIAGNOSIS — I739 Peripheral vascular disease, unspecified: Secondary | ICD-10-CM | POA: Diagnosis not present

## 2021-02-13 DIAGNOSIS — R634 Abnormal weight loss: Secondary | ICD-10-CM

## 2021-02-13 DIAGNOSIS — G2581 Restless legs syndrome: Secondary | ICD-10-CM | POA: Diagnosis not present

## 2021-02-13 DIAGNOSIS — M159 Polyosteoarthritis, unspecified: Secondary | ICD-10-CM

## 2021-02-13 DIAGNOSIS — K5901 Slow transit constipation: Secondary | ICD-10-CM

## 2021-02-13 DIAGNOSIS — L210 Seborrhea capitis: Secondary | ICD-10-CM

## 2021-02-13 DIAGNOSIS — F5105 Insomnia due to other mental disorder: Secondary | ICD-10-CM | POA: Diagnosis not present

## 2021-02-13 DIAGNOSIS — F418 Other specified anxiety disorders: Secondary | ICD-10-CM

## 2021-02-13 DIAGNOSIS — N1832 Chronic kidney disease, stage 3b: Secondary | ICD-10-CM

## 2021-02-13 DIAGNOSIS — M8949 Other hypertrophic osteoarthropathy, multiple sites: Secondary | ICD-10-CM | POA: Diagnosis not present

## 2021-02-13 DIAGNOSIS — R269 Unspecified abnormalities of gait and mobility: Secondary | ICD-10-CM | POA: Diagnosis not present

## 2021-02-13 NOTE — Progress Notes (Signed)
Location:   [place] Room Number: N01 Place of Service:  SNF (31) Provider:  Windell Moulding, NP    Patient Care Team: Mast, Man X, NP as PCP - General (Internal Medicine) Wardell Honour, MD as Attending Physician (Family Medicine)  Extended Emergency Contact Information Primary Emergency Contact: [facility] Address: [place] [address] [place] Phone: [phone] Mobile Phone: [phone] Relation: Son Secondary Emergency Contact: [facility] Address: [address]          [place], [place] [address] [place] Phone: [phone] Relation: Son  Code Status: DNR  Goals of care: Advanced Directive information Advanced Directives 02/13/2021  Does Patient Have a Medical Advance Directive? Yes  Type of Paramedic of [place];Living will;Out of facility DNR (pink MOST or yellow form)  Does patient want to make changes to medical advance directive? No - Patient declined  Copy of [place] in Chart? Yes - validated most recent copy scanned in chart (See row information)  Would patient like information on creating a medical advance directive? -  Pre-existing out of facility DNR order (yellow form or pink MOST form) Pink MOST form placed in chart (order not valid for inpatient use)     Chief Complaint  Patient presents with  . Medical Management of Chronic Issues    Routine follow up.    HPI:  Pt is a 87 y.o. female seen today for medical management of chronic diseases.    She continues to reside on the skilled nursing unit at [facility]. Past medical history includes: hypertension, PVD, osteoarthritis of multiple joints, CKD stage 3, posterior tendon dysfunction with right foot drop, constipation, restless leg syndrome and anxiety.   She has adjusted well to skilled in the last 6 months. Upset she cannot walk, but moves around well in  motorized wheelchair. Dependent with transfers. No recent falls or injuries.   She reports the food served has not improved. She tries to order food she likes but at times she is served meals she does not care for. Averaging about 50-75% of meals. She remains on remeron 7.5 mg QHS.  Recent weights are as follows:  05/11- 125 lbs  04/13- 127.4 lbs  03/09- 127 lbs  02/09- 128.8 lbs  Constipation stable with colace, senna and prn miralax. Last BM today.   Hypertension controlled with daily metoprolol and amlodipine. She denies chest pain or sob. Recent blood pressures are as follows:  05/10- 122/86  05/03- 132/82  04/26- 130/87  04/19- 133/64  Arthritis in her hands unchanged. Right hand worse than left. Pain controlled with prn tylenol. She is able to eat and perform ADLs.   Restless legs stable with requip.   Nurse does not report any concerns, vitals stable.       Past Medical History:  Diagnosis Date  . Anemia   . Hypertension   . OA (osteoarthritis)   . Osteoporosis   . Renal cyst    Past Surgical History:  Procedure Laterality Date  . APPENDECTOMY    . BLADDER SUSPENSION    . CATARACT EXTRACTION    . CESAREAN SECTION    . REPLACEMENT TOTAL KNEE  1995  . TONSILLECTOMY      No Known Allergies  Allergies as of 02/13/2021   No Known Allergies     Medication List       Accurate  as of Feb 13, 2021  9:29 AM. If you have any questions, ask your nurse or doctor.        STOP taking these medications   mineral oil-hydrophilic petrolatum ointment Stopped by: Yvonna Alanis, NP     TAKE these medications   acetaminophen 325 MG tablet Commonly known as: TYLENOL Take 650 mg by mouth every 4 (four) hours as needed.   amLODipine 2.5 MG tablet Commonly known as: NORVASC Take 2.5 mg by mouth daily.   Artificial Tears 1.4 % ophthalmic solution Generic drug: polyvinyl alcohol Place 1 drop into both eyes 4 (four) times daily as needed for dry eyes.   aspirin EC 81  MG tablet Take 81 mg by mouth. Once A Morning on Mon, Thu   Biotin 5000 MCG Tabs Take 5,000 mcg by mouth daily.   bisacodyl 10 MG suppository Commonly known as: DULCOLAX Place 10 mg rectally every other day. What changed: Another medication with the same name was removed. Continue taking this medication, and follow the directions you see here. Changed by: Yvonna Alanis, NP   docusate sodium 100 MG capsule Commonly known as: COLACE Take 100 mg by mouth daily.   KETOCONAZOLE (TOPICAL) 1 % Sham Apply 1 application topically. Special Instructions: wash hair 2x a week   magnesium hydroxide 400 MG/5ML suspension Commonly known as: MILK OF MAGNESIA Take 30 mLs by mouth daily as needed for mild constipation.   metoprolol tartrate 50 MG tablet Commonly known as: LOPRESSOR TAKE 1 TABLET BY MOUTH TWICE DAILY.   mirtazapine 7.5 MG tablet Commonly known as: REMERON TAKE ONE TABLET AT BEDTIME.   multivitamin-lutein Caps capsule Take 1 capsule by mouth 2 (two) times daily.   polyethylene glycol 17 g packet Commonly known as: MIRALAX / GLYCOLAX Take 17 g by mouth daily as needed.   rOPINIRole 1 MG tablet Commonly known as: REQUIP TAKE 1 TABLET EACH EVENING.   sennosides-docusate sodium 8.6-50 MG tablet Commonly known as: SENOKOT-S Take 2 tablets by mouth daily.   Vitamin D 50 MCG (2000 UT) Caps Take 2,000 Units by mouth daily.   zinc oxide 20 % ointment Apply 1 application topically as needed for irritation. Special Instructions: To buttocks after every incontinent episode and as needed for redness. May keep at bedside.       Review of Systems  Constitutional: Negative for activity change, appetite change, fatigue and fever.  HENT: Positive for hearing loss. Negative for congestion and trouble swallowing.        Bilateral hearing aids  Eyes: Negative for pain and discharge.       Glasses  Respiratory: Negative for cough, shortness of breath and wheezing.   Cardiovascular:  Positive for leg swelling. Negative for chest pain.  Gastrointestinal: Positive for constipation. Negative for abdominal distention, abdominal pain, diarrhea and nausea.  Genitourinary: Negative for dysuria, frequency and hematuria.  Musculoskeletal: Positive for arthralgias, gait problem and myalgias.  Skin:       Dry skin on scalp  Neurological: Positive for weakness. Negative for dizziness and headaches.  Psychiatric/Behavioral: Negative for confusion and sleep disturbance. The patient is not nervous/anxious.     Immunization History  Administered Date(s) Administered  . Influenza, High Dose Seasonal PF 07/10/2017, 07/15/2019, 07/05/2020  . Influenza,inj,Quad PF,6+ Mos 07/03/2018  . Influenza-Unspecified 07/10/2017  . Moderna Sars-Covid-2 Vaccination 10/05/2019, 11/02/2019, 08/15/2020  . Pneumococcal Conjugate-13 08/18/2014  . Pneumococcal Polysaccharide-23 07/26/2010  . Tdap 11/12/2018   Pertinent  Health Maintenance Due  Topic Date Due  .  INFLUENZA VACCINE  05/01/2021  . PNA vac Low Risk Adult  Completed  . DEXA SCAN  Discontinued   Fall Risk  08/12/2019 07/23/2019 07/15/2019 04/15/2019 09/02/2018  Falls in the past year? 0 0 0 0 0  Number falls in past yr: 0 0 0 0 0  Injury with Fall? - 0 - 0 0   Functional Status Survey:    Vitals:   02/13/21 0915  BP: 122/86  Pulse: 72  Resp: 16  Temp: (!) 97 F (36.1 C)  SpO2: 93%  Weight: 125 lb (56.7 kg)  Height: 5' (1.524 m)   Body mass index is 24.41 kg/m. Physical Exam Vitals reviewed.  Constitutional:      General: She is not in acute distress. HENT:     Head: Normocephalic.     Right Ear: There is no impacted cerumen.     Left Ear: There is no impacted cerumen.     Nose: Nose normal.     Mouth/Throat:     Mouth: Mucous membranes are moist.  Eyes:     General:        Right eye: No discharge.        Left eye: No discharge.     Comments: Slight redness to left lower lid. No swelling or drainage. No foreign  object noted.   Neck:     Vascular: No carotid bruit.  Cardiovascular:     Rate and Rhythm: Normal rate and regular rhythm.     Pulses: Normal pulses.     Heart sounds: Normal heart sounds. No murmur heard.   Pulmonary:     Effort: Pulmonary effort is normal. No respiratory distress.     Breath sounds: Normal breath sounds. No wheezing.  Abdominal:     General: Bowel sounds are normal. There is no distension.     Palpations: Abdomen is soft.     Tenderness: There is no abdominal tenderness.  Musculoskeletal:     Cervical back: Normal range of motion.     Right lower leg: Edema present.     Left lower leg: Edema present.     Comments: Non-pitting edema, compression stocking on.  Right knee tenderness over MCL, some effusion and warmth present. Right foot drop present. Right hand grip 4/5. Left hand grip 5/5  Lymphadenopathy:     Cervical: No cervical adenopathy.  Skin:    General: Skin is warm and dry.     Capillary Refill: Capillary refill takes less than 2 seconds.     Comments: Small lesion to scalp, skin appears dry and flaking. No drainage or redness.   Neurological:     General: No focal deficit present.     Mental Status: She is alert and oriented to person, place, and time.     Motor: Weakness present.     Gait: Gait abnormal.     Comments: motorized wheelchair  Psychiatric:        Mood and Affect: Mood normal.        Behavior: Behavior normal.     Labs reviewed: Recent Labs    03/17/20 0000 09/20/20 0000  NA 141 140  K 4.0 4.0  CL 105 105  CO2 30* 29*  BUN 22* 26*  CREATININE 0.9 1.1  CALCIUM 8.8 9.2   Recent Labs    03/17/20 0000 09/20/20 0000  AST 14 13  ALT 10 6*  ALKPHOS 65 61  ALBUMIN 3.2* 3.5   Recent Labs    03/17/20 0000 09/20/20 0000  WBC 5.0 6.1  NEUTROABS 1,905 2,879.00  HGB 12.5 12.2  HCT 37 36  PLT 180 207   Lab Results  Component Value Date   TSH 1.66 09/20/2020   No results found for: HGBA1C Lab Results  Component  Value Date   CHOL 175 07/23/2019   HDL 54 07/23/2019   LDLCALC 105 (H) 07/23/2019   TRIG 74 07/23/2019   CHOLHDL 3.2 07/23/2019    Significant Diagnostic Results in last 30 days:  No results found.  Assessment/Plan 1. Essential hypertension - controlled  - cont metoprolol and amlodipine - cont low sodium diet - cbc/diff- future - cmp- future  2. Stage 3b chronic kidney disease ([place]) - continue to avoid nephrotoxic drugs like NSAIDS and dose adjust medications to be renally excreted - encourage hydration -cmp- future  3. Primary osteoarthritis involving multiple joints - ongoing, right knee, ankle and bilateral hands most bothersome - non-pitting edema in lower legs, right more than left - still able to eat and perform ADLs - right hand weaker than left  4. Gait abnormality - ambulates with motorized wheelchair - dependent with transfers - no recent falls or injuries - cont skilled nursing care  5. Slow transit constipation - last BM today, abdomen soft - cont daily colace, senna and prn miralax - encourage hydration  6. PVD (peripheral vascular disease) (HCC) - stable, cont asa  7. RLS (restless legs syndrome) - stable with ropinirole  8. Insomnia secondary to depression with anxiety - no issues with sleeping - adjusted well to skilled  - cont remeron   9. Weight loss - 3 lbs weight loss in past month - she continues to dislike the foods offered here - cont remeron QHS - may consider meal supplement shake if weight loss continues - TSH- future  10. Seborrhea capitis in adult - stable with ketoconazole shampoo 2x weekly   Family/ staff Communication: plan discussed with patient and nurse  Labs/tests ordered:  Cbc/diff, cmp, hepatic panel, TSH- June 2022

## 2021-02-24 DIAGNOSIS — H353132 Nonexudative age-related macular degeneration, bilateral, intermediate dry stage: Secondary | ICD-10-CM | POA: Diagnosis not present

## 2021-02-24 DIAGNOSIS — H04123 Dry eye syndrome of bilateral lacrimal glands: Secondary | ICD-10-CM | POA: Diagnosis not present

## 2021-03-02 ENCOUNTER — Encounter: Payer: Self-pay | Admitting: Nurse Practitioner

## 2021-03-02 DIAGNOSIS — L570 Actinic keratosis: Secondary | ICD-10-CM | POA: Diagnosis not present

## 2021-03-02 DIAGNOSIS — H40059 Ocular hypertension, unspecified eye: Secondary | ICD-10-CM | POA: Insufficient documentation

## 2021-03-02 DIAGNOSIS — L814 Other melanin hyperpigmentation: Secondary | ICD-10-CM | POA: Diagnosis not present

## 2021-03-02 DIAGNOSIS — Z85828 Personal history of other malignant neoplasm of skin: Secondary | ICD-10-CM | POA: Diagnosis not present

## 2021-03-06 ENCOUNTER — Non-Acute Institutional Stay (SKILLED_NURSING_FACILITY): Payer: PPO | Admitting: Orthopedic Surgery

## 2021-03-06 ENCOUNTER — Encounter: Payer: Self-pay | Admitting: Orthopedic Surgery

## 2021-03-06 DIAGNOSIS — Z87440 Personal history of urinary (tract) infections: Secondary | ICD-10-CM

## 2021-03-06 DIAGNOSIS — R3989 Other symptoms and signs involving the genitourinary system: Secondary | ICD-10-CM

## 2021-03-06 DIAGNOSIS — R350 Frequency of micturition: Secondary | ICD-10-CM | POA: Diagnosis not present

## 2021-03-06 NOTE — Progress Notes (Signed)
Location:  [facility] Room Number: 1 Place of Service:  SNF (31) Provider: Windell Moulding, AGNP-C  Mast, Man X, NP  Patient Care Team: Mast, Man X, NP as PCP - General (Internal Medicine) Wardell Honour, MD as Attending Physician (Family Medicine)  Extended Emergency Contact Information Primary Emergency Contact: [facility] Address: [place] [address] [place] Phone: [phone] Mobile Phone: [phone] Relation: Son Secondary Emergency Contact: [facility] Address: [address]          [place], [place] [address] [place] Phone: [phone] Relation: Son  Code Status: DNR Goals of care: Advanced Directive information Advanced Directives 02/13/2021  Does Patient Have a Medical Advance Directive? Yes  Type of Paramedic of [place];Living will;Out of facility DNR (pink MOST or yellow form)  Does patient want to make changes to medical advance directive? No - Patient declined  Copy of [place] in Chart? Yes - validated most recent copy scanned in chart (See row information)  Would patient like information on creating a medical advance directive? -  Pre-existing out of facility DNR order (yellow form or pink MOST form) Pink MOST form placed in chart (order not valid for inpatient use)     Chief Complaint  Patient presents with  . Acute Visit    Urinary frequency    HPI:  Pt is a 87 y.o. female seen today for acute visit for urinary frequency. Symptoms began yesterday. She noticed her brief was soaked > 5 times last night. In addition, she reports pressure over her bladder. She has a  history of frequent UTI due to sedentary lifestyle and incontinence issues .Last UTI 11/23/2020. Denies dysuria, fever or flank pain. Requesting to be placed on a preventative antibiotic.    Past Medical History:  Diagnosis Date  . Anemia   . Hypertension    . OA (osteoarthritis)   . Osteoporosis   . Renal cyst    Past Surgical History:  Procedure Laterality Date  . APPENDECTOMY    . BLADDER SUSPENSION    . CATARACT EXTRACTION    . CESAREAN SECTION    . REPLACEMENT TOTAL KNEE  1995  . TONSILLECTOMY      No Known Allergies  Outpatient Encounter Medications as of 03/06/2021  Medication Sig  . acetaminophen (TYLENOL) 325 MG tablet Take 650 mg by mouth every 4 (four) hours as needed.  Marland Kitchen amLODipine (NORVASC) 2.5 MG tablet Take 2.5 mg by mouth daily.  Marland Kitchen aspirin EC 81 MG tablet Take 81 mg by mouth. Once A Morning on Mon, Thu  . Biotin 5000 MCG TABS Take 5,000 mcg by mouth daily.   . bisacodyl (DULCOLAX) 10 MG suppository Place 10 mg rectally every other day.  . Cholecalciferol (VITAMIN D) 50 MCG (2000 UT) CAPS Take 2,000 Units by mouth daily.   Marland Kitchen docusate sodium (COLACE) 100 MG capsule Take 100 mg by mouth daily.  Marland Kitchen KETOCONAZOLE, TOPICAL, 1 % SHAM Apply 1 application topically. Special Instructions: wash hair 2x a week  . magnesium hydroxide (MILK OF MAGNESIA) 400 MG/5ML suspension Take 30 mLs by mouth daily as needed for mild constipation.  . metoprolol tartrate (LOPRESSOR) 50 MG tablet TAKE 1 TABLET BY MOUTH TWICE DAILY.  . mirtazapine (REMERON) 7.5 MG tablet TAKE ONE TABLET AT BEDTIME.  . multivitamin-lutein (OCUVITE-LUTEIN) CAPS capsule Take 1 capsule by mouth 2 (two) times daily.   Marland Kitchen  polyethylene glycol (MIRALAX / GLYCOLAX) 17 g packet Take 17 g by mouth daily as needed.  . polyvinyl alcohol (ARTIFICIAL TEARS) 1.4 % ophthalmic solution Place 1 drop into both eyes 4 (four) times daily as needed for dry eyes.  Marland Kitchen rOPINIRole (REQUIP) 1 MG tablet TAKE 1 TABLET EACH EVENING.  . sennosides-docusate sodium (SENOKOT-S) 8.6-50 MG tablet Take 2 tablets by mouth daily.  Marland Kitchen zinc oxide 20 % ointment Apply 1 application topically as needed for irritation. Special Instructions: To buttocks after every incontinent episode and as needed for redness. May  keep at bedside.   No facility-administered encounter medications on file as of 03/06/2021.    Review of Systems  Constitutional: Negative for activity change, appetite change, fatigue and fever.  Respiratory: Negative for cough, shortness of breath and wheezing.   Cardiovascular: Negative for chest pain and leg swelling.  Genitourinary: Positive for frequency. Negative for dysuria and hematuria.       Bladder pressure, incontinence  Psychiatric/Behavioral: Negative for dysphoric mood. The patient is not nervous/anxious.     Immunization History  Administered Date(s) Administered  . Influenza, High Dose Seasonal PF 07/10/2017, 07/15/2019, 07/05/2020  . Influenza,inj,Quad PF,6+ Mos 07/03/2018  . Influenza-Unspecified 07/10/2017  . Moderna Sars-Covid-2 Vaccination 10/05/2019, 11/02/2019, 08/15/2020  . Pneumococcal Conjugate-13 08/18/2014  . Pneumococcal Polysaccharide-23 07/26/2010  . Tdap 11/12/2018   Pertinent  Health Maintenance Due  Topic Date Due  . INFLUENZA VACCINE  05/01/2021  . PNA vac Low Risk Adult  Completed  . DEXA SCAN  Discontinued   Fall Risk  08/12/2019 07/23/2019 07/15/2019 04/15/2019 09/02/2018  Falls in the past year? 0 0 0 0 0  Number falls in past yr: 0 0 0 0 0  Injury with Fall? - 0 - 0 0   Functional Status Survey:    Vitals:   03/06/21 1547  BP: 113/76  Pulse: 77  Resp: 18  Temp: (!) 96.7 F (35.9 C)  SpO2: 90%  Weight: 126 lb 6.4 oz (57.3 kg)  Height: 5' (1.524 m)   Body mass index is 24.69 kg/m. Physical Exam Vitals reviewed.  Constitutional:      General: She is not in acute distress. HENT:     Head: Normocephalic.  Cardiovascular:     Rate and Rhythm: Normal rate and regular rhythm.     Pulses: Normal pulses.     Heart sounds: Normal heart sounds. No murmur heard.   Pulmonary:     Effort: Pulmonary effort is normal. No respiratory distress.     Breath sounds: Normal breath sounds. No wheezing.  Skin:    General: Skin is warm  and dry.     Capillary Refill: Capillary refill takes less than 2 seconds.  Neurological:     General: No focal deficit present.     Mental Status: She is alert and oriented to person, place, and time.     Motor: Weakness present.     Gait: Gait abnormal.     Comments: Motorized wheelchair  Psychiatric:        Mood and Affect: Mood normal.        Behavior: Behavior normal.     Labs reviewed: Recent Labs    03/17/20 0000 09/20/20 0000  NA 141 140  K 4.0 4.0  CL 105 105  CO2 30* 29*  BUN 22* 26*  CREATININE 0.9 1.1  CALCIUM 8.8 9.2   Recent Labs    03/17/20 0000 09/20/20 0000  AST 14 13  ALT 10 6*  ALKPHOS 65 61  ALBUMIN 3.2* 3.5   Recent Labs    03/17/20 0000 09/20/20 0000  WBC 5.0 6.1  NEUTROABS 1,905 2,879.00  HGB 12.5 12.2  HCT 37 36  PLT 180 207   Lab Results  Component Value Date   TSH 1.66 09/20/2020   No results found for: HGBA1C Lab Results  Component Value Date   CHOL 175 07/23/2019   HDL 54 07/23/2019   LDLCALC 105 (H) 07/23/2019   TRIG 74 07/23/2019   CHOLHDL 3.2 07/23/2019    Significant Diagnostic Results in last 30 days:  No results found.  Assessment/Plan 1. Urinary frequency - increased nocturia with bladder pressure - last UTI 11/2020, 06/2020 - UA and urine culture - cranberry 450 mg po daily- for prevention - encourage fluids - will consider preventative antibiotic after culture results/treatment  2. History of UTI - see above  3. Sensation of pressure in bladder area - see above    Family/ staff Communication: plan discussed with patient and nurse  Labs/tests ordered: UA and urine culture

## 2021-03-07 DIAGNOSIS — R300 Dysuria: Secondary | ICD-10-CM | POA: Diagnosis not present

## 2021-03-08 ENCOUNTER — Encounter: Payer: Self-pay | Admitting: Internal Medicine

## 2021-03-08 DIAGNOSIS — N390 Urinary tract infection, site not specified: Secondary | ICD-10-CM | POA: Diagnosis not present

## 2021-03-13 ENCOUNTER — Non-Acute Institutional Stay (SKILLED_NURSING_FACILITY): Payer: PPO | Admitting: Orthopedic Surgery

## 2021-03-13 ENCOUNTER — Encounter: Payer: Self-pay | Admitting: Orthopedic Surgery

## 2021-03-13 DIAGNOSIS — R269 Unspecified abnormalities of gait and mobility: Secondary | ICD-10-CM

## 2021-03-13 DIAGNOSIS — F418 Other specified anxiety disorders: Secondary | ICD-10-CM

## 2021-03-13 DIAGNOSIS — M150 Primary generalized (osteo)arthritis: Secondary | ICD-10-CM

## 2021-03-13 DIAGNOSIS — M159 Polyosteoarthritis, unspecified: Secondary | ICD-10-CM

## 2021-03-13 DIAGNOSIS — R350 Frequency of micturition: Secondary | ICD-10-CM | POA: Diagnosis not present

## 2021-03-13 DIAGNOSIS — K5901 Slow transit constipation: Secondary | ICD-10-CM | POA: Diagnosis not present

## 2021-03-13 DIAGNOSIS — N1832 Chronic kidney disease, stage 3b: Secondary | ICD-10-CM

## 2021-03-13 DIAGNOSIS — Z87440 Personal history of urinary (tract) infections: Secondary | ICD-10-CM

## 2021-03-13 DIAGNOSIS — G2581 Restless legs syndrome: Secondary | ICD-10-CM | POA: Diagnosis not present

## 2021-03-13 DIAGNOSIS — F5105 Insomnia due to other mental disorder: Secondary | ICD-10-CM | POA: Diagnosis not present

## 2021-03-13 DIAGNOSIS — M8949 Other hypertrophic osteoarthropathy, multiple sites: Secondary | ICD-10-CM | POA: Diagnosis not present

## 2021-03-13 DIAGNOSIS — I10 Essential (primary) hypertension: Secondary | ICD-10-CM | POA: Diagnosis not present

## 2021-03-13 NOTE — Progress Notes (Signed)
Location:   [facility] Room Number: N01 Place of Service:  SNF (31) Provider:  Windell Moulding, NP    Patient Care Team: Mast, Man X, NP as PCP - General (Internal Medicine) Wardell Honour, MD as Attending Physician (Family Medicine)  Extended Emergency Contact Information Primary Emergency Contact: [facility] Address: [place] [address] [place] Phone: [phone] Mobile Phone: [phone] Relation: Son Secondary Emergency Contact: [facility] Address: [address]          [place], [place] [address] [place] Phone: [phone] Relation: Son  Code Status:  DNR Goals of care: Advanced Directive information Advanced Directives 03/13/2021  Does Patient Have a Medical Advance Directive? Yes  Type of Paramedic of [place];Living will;Out of facility DNR (pink MOST or yellow form)  Does patient want to make changes to medical advance directive? No - Patient declined  Copy of [place] in Chart? Yes - validated most recent copy scanned in chart (See row information)  Would patient like information on creating a medical advance directive? -  Pre-existing out of facility DNR order (yellow form or pink MOST form) Yellow form placed in chart (order not valid for inpatient use);Pink MOST form placed in chart (order not valid for inpatient use)     Chief Complaint  Patient presents with   Medical Management of Chronic Issues    Routine follow up.    Health Maintenance    Discuss need for shingles vaccine and 4th COVID booster.     HPI:  Pt is a 87 y.o. female seen today for medical management of chronic diseases.    She currently resides in the [facility] at [facility]. Past medical history includes: hypertension, PVD, constipation, OA, CKD stage 3, generalized weakness, and gait abnormality.   HTN: BUN/creat 26/1.1 09/20/2021.  Metoprolol 50 mg bid.   CKD 3b. BUN/creat 26/1.1 09/20/2021.  UTI. 06/06 reported urinary frequency with dysuria. UA/urine culture collected, culture no growth. Cranberry supplement started. Requesting macrobid daily for prevention after current episode treated. Believes poor mobility and use of briefs cause of frequent UTI. Also admits to not drinking enough fluids.   OA multiple joints. Shoulders and knee painful at times. Ambulates with motorized wheelchair. No recent falls or injuries. Denies pain today. Tylenol 325 mg po prn.   Gait abnormality. See above.   Constipation. Bowel movements about every three days. No complaints. Colace and senna daily. Miralax prn.   Restless legs. Remains on low dose Requip.   Insomina with depression/anxiety. Adjusted to SNF well. No issues with sleep. Seen often outside of room with others. Remeron 7.5 mg qhs.   Recent blood pressures:   06/07- 120/84  05/31- 113/76  05/24- 126/80  Recent weights:  06/08- 125.6 lbs  05/03- 127.6 lbs  04/13- 127.4 lbs  03/01- 126.8 lbs       Past Medical History:  Diagnosis Date   Anemia    Hypertension    OA (osteoarthritis)    Osteoporosis    Renal cyst    Past Surgical History:  Procedure Laterality Date   APPENDECTOMY     BLADDER SUSPENSION     CATARACT EXTRACTION     CESAREAN SECTION     REPLACEMENT TOTAL KNEE  1995   TONSILLECTOMY      No Known Allergies  Allergies as of 03/13/2021   No  Known Allergies      Medication List        Accurate as of March 13, 2021 10:33 AM. If you have any questions, ask your nurse or doctor.          acetaminophen 325 MG tablet Commonly known as: TYLENOL Take 650 mg by mouth every 4 (four) hours as needed.   amLODipine 2.5 MG tablet Commonly known as: NORVASC Take 2.5 mg by mouth daily.   aspirin EC 81 MG tablet Take 81 mg by mouth. Once A Morning on Mon, Thu   Biotin 5000 MCG Tabs Take 5,000 mcg by mouth daily.   bisacodyl 10 MG  suppository Commonly known as: DULCOLAX Place 10 mg rectally every other day.   Cranberry 450 MG Tabs Take 1 tablet by mouth daily.   docusate sodium 100 MG capsule Commonly known as: COLACE Take 100 mg by mouth daily.   KETOCONAZOLE (TOPICAL) 1 % Sham Apply 1 application topically. Special Instructions: wash hair 2x a week   magnesium hydroxide 400 MG/5ML suspension Commonly known as: MILK OF MAGNESIA Take 30 mLs by mouth daily as needed for mild constipation.   metoprolol tartrate 50 MG tablet Commonly known as: LOPRESSOR TAKE 1 TABLET BY MOUTH TWICE DAILY.   mirtazapine 7.5 MG tablet Commonly known as: REMERON TAKE ONE TABLET AT BEDTIME.   multivitamin-lutein Caps capsule Take 1 capsule by mouth 2 (two) times daily.   polyethylene glycol 17 g packet Commonly known as: MIRALAX / GLYCOLAX Take 17 g by mouth daily as needed.   polyvinyl alcohol 1.4 % ophthalmic solution Commonly known as: LIQUIFILM TEARS Place 1 drop into both eyes in the morning, at noon, in the evening, and at bedtime.   rOPINIRole 1 MG tablet Commonly known as: REQUIP TAKE 1 TABLET EACH EVENING.   sennosides-docusate sodium 8.6-50 MG tablet Commonly known as: SENOKOT-S Take 2 tablets by mouth daily.   Vitamin D 50 MCG (2000 UT) Caps Take 2,000 Units by mouth daily.   zinc oxide 20 % ointment Apply 1 application topically as needed for irritation. Special Instructions: To buttocks after every incontinent episode and as needed for redness. May keep at bedside.        Review of Systems  Constitutional:  Negative for activity change, appetite change, fatigue and fever.  HENT:  Positive for hearing loss. Negative for dental problem and trouble swallowing.        Hearing aid  Eyes:  Negative for visual disturbance.       Glasses  Respiratory:  Negative for cough, shortness of breath and wheezing.   Cardiovascular:  Positive for leg swelling. Negative for chest pain.  Gastrointestinal:   Positive for constipation. Negative for abdominal distention, abdominal pain, diarrhea and nausea.  Genitourinary:  Positive for dysuria and frequency. Negative for hematuria.       Incontinence  Musculoskeletal:  Positive for arthralgias, gait problem and myalgias.  Skin: Negative.   Neurological:  Positive for weakness and numbness. Negative for dizziness and headaches.  Psychiatric/Behavioral:  Positive for sleep disturbance. Negative for confusion and dysphoric mood. The patient is not nervous/anxious.    Immunization History  Administered Date(s) Administered   Influenza, High Dose Seasonal PF 07/10/2017, 07/15/2019, 07/05/2020   Influenza,inj,Quad PF,6+ Mos 07/03/2018   Influenza-Unspecified 07/10/2017   Moderna Sars-Covid-2 Vaccination 10/05/2019, 11/02/2019, 08/15/2020   Pneumococcal Conjugate-13 08/18/2014   Pneumococcal Polysaccharide-23 07/26/2010   Tdap 11/12/2018   Pertinent  Health Maintenance Due  Topic Date Due   INFLUENZA  VACCINE  05/01/2021   PNA vac Low Risk Adult  Completed   DEXA SCAN  Discontinued   Fall Risk  08/12/2019 07/23/2019 07/15/2019 04/15/2019 09/02/2018  Falls in the past year? 0 0 0 0 0  Number falls in past yr: 0 0 0 0 0  Injury with Fall? - 0 - 0 0   Functional Status Survey:    Vitals:   03/13/21 1026  BP: 120/84  Pulse: 81  Resp: 16  Temp: (!) 96.8 F (36 C)  SpO2: 94%  Weight: 125 lb 9.6 oz (57 kg)  Height: 5' (1.524 m)   Body mass index is 24.53 kg/m. Physical Exam Vitals reviewed.  Constitutional:      General: She is not in acute distress. HENT:     Head: Normocephalic.     Right Ear: There is no impacted cerumen.     Left Ear: There is no impacted cerumen.     Nose: Nose normal.     Mouth/Throat:     Mouth: Mucous membranes are moist.     Pharynx: No posterior oropharyngeal erythema.  Eyes:     General:        Right eye: No discharge.        Left eye: No discharge.  Neck:     Thyroid: No thyroid mass or  thyromegaly.  Cardiovascular:     Rate and Rhythm: Normal rate and regular rhythm.     Pulses: Normal pulses.     Heart sounds: Normal heart sounds. No murmur heard. Pulmonary:     Effort: Pulmonary effort is normal. No respiratory distress.     Breath sounds: Normal breath sounds. No wheezing.  Abdominal:     General: Bowel sounds are normal. There is no distension.     Palpations: Abdomen is soft.     Tenderness: There is no abdominal tenderness.  Musculoskeletal:     Cervical back: Normal range of motion.     Right lower leg: Edema present.     Left lower leg: Edema present.     Comments: 2+ pitting  Lymphadenopathy:     Cervical: No cervical adenopathy.  Skin:    General: Skin is warm and dry.     Capillary Refill: Capillary refill takes less than 2 seconds.  Neurological:     General: No focal deficit present.     Mental Status: She is alert and oriented to person, place, and time.     Motor: Weakness present.     Gait: Gait abnormal.     Comments: Power wheelchair  Psychiatric:        Mood and Affect: Mood normal.        Behavior: Behavior normal.    Labs reviewed: Recent Labs    03/17/20 0000 09/20/20 0000  NA 141 140  K 4.0 4.0  CL 105 105  CO2 30* 29*  BUN 22* 26*  CREATININE 0.9 1.1  CALCIUM 8.8 9.2   Recent Labs    03/17/20 0000 09/20/20 0000  AST 14 13  ALT 10 6*  ALKPHOS 65 61  ALBUMIN 3.2* 3.5   Recent Labs    03/17/20 0000 09/20/20 0000  WBC 5.0 6.1  NEUTROABS 1,905 2,879.00  HGB 12.5 12.2  HCT 37 36  PLT 180 207   Lab Results  Component Value Date   TSH 1.66 09/20/2020   No results found for: HGBA1C Lab Results  Component Value Date   CHOL 175 07/23/2019   HDL 54 07/23/2019  LDLCALC 105 (H) 07/23/2019   TRIG 74 07/23/2019   CHOLHDL 3.2 07/23/2019    Significant Diagnostic Results in last 30 days:  No results found.  Assessment/Plan 1. Urinary frequency - urine culture negative, UA + blood/leukocytes - cont cranberry  supplement - hydrate  2. History of UTI - increased risk due to poor mobility and brief use - macrobid 100 mg po daily for prevention  3. Essential hypertension - controlled - cont metoprolol po bid - cbc/diff  4. Stage 3b chronic kidney disease ([place]) - BUN/creat 26/1.1 09/20/2021 - continue to avoid nephrotoxic drugs like NSAIDS and dose adjust medications to be renally excreted - hydrate - cmp  5. Primary osteoarthritis involving multiple joints - no increased pain - cont tylenol prn - hepatic panel  6. Gait abnormality - stable with power wheel chair  7. Slow transit constipation - BM every 3 days - cont colace and senna daily - cont miralax prn - encourage hydration  8. RLS (restless legs syndrome) - stable with Requip  9. Insomnia secondary to depression with anxiety - stable with Remeron 7.5 mg daily    Family/ staff Communication:   Labs/tests ordered:

## 2021-03-16 DIAGNOSIS — N183 Chronic kidney disease, stage 3 unspecified: Secondary | ICD-10-CM | POA: Diagnosis not present

## 2021-03-16 DIAGNOSIS — I129 Hypertensive chronic kidney disease with stage 1 through stage 4 chronic kidney disease, or unspecified chronic kidney disease: Secondary | ICD-10-CM | POA: Diagnosis not present

## 2021-03-24 DIAGNOSIS — R293 Abnormal posture: Secondary | ICD-10-CM | POA: Diagnosis not present

## 2021-03-24 DIAGNOSIS — M6281 Muscle weakness (generalized): Secondary | ICD-10-CM | POA: Diagnosis not present

## 2021-03-31 ENCOUNTER — Encounter: Payer: Self-pay | Admitting: Orthopedic Surgery

## 2021-03-31 ENCOUNTER — Non-Acute Institutional Stay (SKILLED_NURSING_FACILITY): Payer: PPO | Admitting: Orthopedic Surgery

## 2021-03-31 DIAGNOSIS — Z66 Do not resuscitate: Secondary | ICD-10-CM | POA: Diagnosis not present

## 2021-03-31 DIAGNOSIS — R300 Dysuria: Secondary | ICD-10-CM

## 2021-03-31 MED ORDER — PHENAZOPYRIDINE HCL 100 MG PO TABS: 100.0000 mg | ORAL_TABLET | Freq: Three times a day (TID) | ORAL | 0 refills | Status: DC | PRN

## 2021-03-31 MED ORDER — PHENAZOPYRIDINE HCL 100 MG PO TABS: 100.0000 mg | ORAL_TABLET | Freq: Three times a day (TID) | ORAL | 0 refills | Status: AC | PRN

## 2021-03-31 NOTE — Progress Notes (Signed)
Location:  [place] Room Number: N01 Place of Service:  SNF (31) Provider:  Windell Moulding, AGNP-C  Mast, Man X, NP  Patient Care Team: Mast, Man X, NP as PCP - General (Internal Medicine) Wardell Honour, MD as Attending Physician (Family Medicine)  Extended Emergency Contact Information Primary Emergency Contact: [facility] Address: [place] [address] [place] Phone: [phone] Mobile Phone: [phone] Relation: Son Secondary Emergency Contact: [facility] Address: [address]          [place], [place] [address] [place] Phone: [phone] Relation: Son  Code Status:  DNR Goals of care: Advanced Directive information Advanced Directives 03/13/2021  Does Patient Have a Medical Advance Directive? Yes  Type of Paramedic of [place];Living will;Out of facility DNR (pink MOST or yellow form)  Does patient want to make changes to medical advance directive? No - Patient declined  Copy of [place] in Chart? Yes - validated most recent copy scanned in chart (See row information)  Would patient like information on creating a medical advance directive? -  Pre-existing out of facility DNR order (yellow form or pink MOST form) Yellow form placed in chart (order not valid for inpatient use);Pink MOST form placed in chart (order not valid for inpatient use)     Chief Complaint  Patient presents with   Acute Visit    dysuria    HPI:  Pt is a 87 y.o. female seen today for acute visit for dysuria.   She reports intermittent dysuria within the past few days. Pain described as  burning pain on her labia when urinating. Reports drinking adequate amount of fluids daily. Asking for pyridium to help relieve symptoms.   Nurse does not report any other concerns, vitals stable.    Past Medical History:  Diagnosis Date   Anemia    Hypertension    OA  (osteoarthritis)    Osteoporosis    Renal cyst    Past Surgical History:  Procedure Laterality Date   APPENDECTOMY     BLADDER SUSPENSION     CATARACT EXTRACTION     CESAREAN SECTION     REPLACEMENT TOTAL KNEE  1995   TONSILLECTOMY      No Known Allergies  Outpatient Encounter Medications as of 03/31/2021  Medication Sig   acetaminophen (TYLENOL) 325 MG tablet Take 650 mg by mouth every 4 (four) hours as needed.   amLODipine (NORVASC) 2.5 MG tablet Take 2.5 mg by mouth daily.   aspirin EC 81 MG tablet Take 81 mg by mouth. Once A Morning on Mon, Thu   Biotin 5000 MCG TABS Take 5,000 mcg by mouth daily.    bisacodyl (DULCOLAX) 10 MG suppository Place 10 mg rectally every other day.   Cholecalciferol (VITAMIN D) 50 MCG (2000 UT) CAPS Take 2,000 Units by mouth daily.    Cranberry 450 MG TABS Take 1 tablet by mouth daily.   docusate sodium (COLACE) 100 MG capsule Take 100 mg by mouth daily.   KETOCONAZOLE, TOPICAL, 1 % SHAM Apply 1 application topically. Special Instructions: wash hair 2x a week   magnesium hydroxide (MILK OF MAGNESIA) 400 MG/5ML suspension Take 30 mLs by mouth daily as needed for mild constipation.   metoprolol tartrate (LOPRESSOR) 50 MG tablet TAKE 1 TABLET BY MOUTH TWICE DAILY.   mirtazapine (REMERON) 7.5 MG tablet TAKE ONE TABLET AT BEDTIME.  multivitamin-lutein (OCUVITE-LUTEIN) CAPS capsule Take 1 capsule by mouth 2 (two) times daily.    polyethylene glycol (MIRALAX / GLYCOLAX) 17 g packet Take 17 g by mouth daily as needed.   polyvinyl alcohol (LIQUIFILM TEARS) 1.4 % ophthalmic solution Place 1 drop into both eyes in the morning, at noon, in the evening, and at bedtime.   rOPINIRole (REQUIP) 1 MG tablet TAKE 1 TABLET EACH EVENING.   sennosides-docusate sodium (SENOKOT-S) 8.6-50 MG tablet Take 2 tablets by mouth daily.   zinc oxide 20 % ointment Apply 1 application topically as needed for irritation. Special Instructions: To buttocks after every incontinent episode  and as needed for redness. May keep at bedside.   No facility-administered encounter medications on file as of 03/31/2021.    Review of Systems  Constitutional:  Negative for activity change, appetite change, chills and fever.  Respiratory:  Negative for cough, shortness of breath and wheezing.   Cardiovascular:  Negative for chest pain and leg swelling.  Genitourinary:  Positive for dysuria and frequency. Negative for hematuria.  Psychiatric/Behavioral:  Negative for dysphoric mood. The patient is not nervous/anxious.    Immunization History  Administered Date(s) Administered   Influenza, High Dose Seasonal PF 07/10/2017, 07/15/2019, 07/05/2020   Influenza,inj,Quad PF,6+ Mos 07/03/2018   Influenza-Unspecified 07/10/2017   Moderna Sars-Covid-2 Vaccination 10/05/2019, 11/02/2019, 08/15/2020   Pneumococcal Conjugate-13 08/18/2014   Pneumococcal Polysaccharide-23 07/26/2010   Tdap 11/12/2018   Pertinent  Health Maintenance Due  Topic Date Due   INFLUENZA VACCINE  05/01/2021   PNA vac Low Risk Adult  Completed   DEXA SCAN  Discontinued   Fall Risk  08/12/2019 07/23/2019 07/15/2019 04/15/2019 09/02/2018  Falls in the past year? 0 0 0 0 0  Number falls in past yr: 0 0 0 0 0  Injury with Fall? - 0 - 0 0   Functional Status Survey:    Vitals:   03/31/21 1555  BP: 118/81  Pulse: 92  Resp: 20  Temp: (!) 96.8 F (36 C)  SpO2: 93%  Weight: 124 lb 9.6 oz (56.5 kg)  Height: 5' (1.524 m)   Body mass index is 24.33 kg/m. Physical Exam Vitals reviewed.  Constitutional:      General: She is not in acute distress. HENT:     Head: Normocephalic.  Cardiovascular:     Rate and Rhythm: Normal rate and regular rhythm.     Pulses: Normal pulses.     Heart sounds: Normal heart sounds. No murmur heard. Pulmonary:     Effort: Pulmonary effort is normal. No respiratory distress.     Breath sounds: Normal breath sounds. No wheezing.  Abdominal:     General: Bowel sounds are normal. There  is no distension.     Palpations: Abdomen is soft.     Tenderness: There is no abdominal tenderness.  Skin:    General: Skin is dry.  Neurological:     General: No focal deficit present.     Mental Status: She is alert and oriented to person, place, and time.  Psychiatric:        Mood and Affect: Mood normal.        Behavior: Behavior normal.    Labs reviewed: Recent Labs    09/20/20 0000  NA 140  K 4.0  CL 105  CO2 29*  BUN 26*  CREATININE 1.1  CALCIUM 9.2   Recent Labs    09/20/20 0000  AST 13  ALT 6*  ALKPHOS 61  ALBUMIN 3.5  Recent Labs    09/20/20 0000  WBC 6.1  NEUTROABS 2,879.00  HGB 12.2  HCT 36  PLT 207   Lab Results  Component Value Date   TSH 1.66 09/20/2020   No results found for: HGBA1C Lab Results  Component Value Date   CHOL 175 07/23/2019   HDL 54 07/23/2019   LDLCALC 105 (H) 07/23/2019   TRIG 74 07/23/2019   CHOLHDL 3.2 07/23/2019    Significant Diagnostic Results in last 30 days:  No results found.  Assessment/Plan 1. Do not resuscitate  2. Dysuria - ongoing - suspect she is not drinking fluids well - recommend hydration with water - phenazopyridine (PYRIDIUM) 100 MG tablet; Take 1 tablet (100 mg total) by mouth 3 (three) times daily for 2 days for pain.  Dispense: 6 tablet; Refill: 0    Family/ staff Communication: plan discussed with patient and nurse  Labs/tests ordered: none

## 2021-04-04 ENCOUNTER — Other Ambulatory Visit: Payer: Self-pay | Admitting: Orthopedic Surgery

## 2021-04-04 DIAGNOSIS — R293 Abnormal posture: Secondary | ICD-10-CM | POA: Diagnosis not present

## 2021-04-04 DIAGNOSIS — M6281 Muscle weakness (generalized): Secondary | ICD-10-CM | POA: Diagnosis not present

## 2021-04-07 ENCOUNTER — Non-Acute Institutional Stay (SKILLED_NURSING_FACILITY): Payer: PPO | Admitting: Orthopedic Surgery

## 2021-04-07 ENCOUNTER — Encounter: Payer: Self-pay | Admitting: Orthopedic Surgery

## 2021-04-07 DIAGNOSIS — R300 Dysuria: Secondary | ICD-10-CM

## 2021-04-07 DIAGNOSIS — R3915 Urgency of urination: Secondary | ICD-10-CM

## 2021-04-07 NOTE — Progress Notes (Signed)
Location:  [place] Room Number: N01 Place of Service:  SNF (31) Provider:  Windell Moulding, AGNP-C  Mast, Man X, NP  Patient Care Team: Mast, Man X, NP as PCP - General (Internal Medicine) Wardell Honour, MD as Attending Physician (Family Medicine)  Extended Emergency Contact Information Primary Emergency Contact: [facility] Address: [place] [address] [place] Phone: [phone] Mobile Phone: [phone] Relation: Son Secondary Emergency Contact: [facility] Address: [address]          [place],  [address] [place] Phone: [phone] Relation: Son  Code Status:  DNR Goals of care: Advanced Directive information Advanced Directives 03/13/2021  Does Patient Have a Medical Advance Directive? Yes  Type of Paramedic of [place];Living will;Out of facility DNR (pink MOST or yellow form)  Does patient want to make changes to medical advance directive? No - Patient declined  Copy of [place] in Chart? Yes - validated most recent copy scanned in chart (See row information)  Would patient like information on creating a medical advance directive? -  Pre-existing out of facility DNR order (yellow form or pink MOST form) Yellow form placed in chart (order not valid for inpatient use);Pink MOST form placed in chart (order not valid for inpatient use)     Chief Complaint  Patient presents with   Acute Visit    dysuria    HPI:  Pt is a 87 y.o. female seen today for acute visit due to dysuria.   07/01 she was seen for ongoing dysuria. She was given a 2 day course of pyridium and advised to increase fluid intake. Today, she reports pyridium was unsuccessful. She continues to have ongoing symptoms of burning near her labia and urgency. Son, who is a physician reports her sister had severe interstitial cystitis in her late years. Denies fever,  flank pain or vaginal discharge.   Nurse does not report any other concerns, vitals stable.    Past Medical History:  Diagnosis Date   Anemia    Hypertension    OA (osteoarthritis)    Osteoporosis    Renal cyst    Past Surgical History:  Procedure Laterality Date   APPENDECTOMY     BLADDER SUSPENSION     CATARACT EXTRACTION     CESAREAN SECTION     REPLACEMENT TOTAL KNEE  1995   TONSILLECTOMY      No Known Allergies  Outpatient Encounter Medications as of 04/07/2021  Medication Sig   acetaminophen (TYLENOL) 325 MG tablet Take 650 mg by mouth every 4 (four) hours as needed.   amLODipine (NORVASC) 2.5 MG tablet Take 2.5 mg by mouth daily.   aspirin EC 81 MG tablet Take 81 mg by mouth. Once A Morning on Mon, Thu   Biotin 5000 MCG TABS Take 5,000 mcg by mouth daily.    bisacodyl (DULCOLAX) 10 MG suppository Place 10 mg rectally every other day.   Cholecalciferol (VITAMIN D) 50 MCG (2000 UT) CAPS Take 2,000 Units by mouth daily.    Cranberry 450 MG TABS Take 1 tablet by mouth daily.   docusate sodium (COLACE) 100 MG capsule Take 100 mg by mouth daily.   KETOCONAZOLE, TOPICAL, 1 % SHAM Apply 1 application topically. Special Instructions: wash hair 2x a week   magnesium hydroxide (MILK OF MAGNESIA) 400 MG/5ML suspension Take 30 mLs by mouth daily as needed  for mild constipation.   metoprolol tartrate (LOPRESSOR) 50 MG tablet TAKE 1 TABLET BY MOUTH TWICE DAILY.   mirtazapine (REMERON) 7.5 MG tablet TAKE ONE TABLET AT BEDTIME.   multivitamin-lutein (OCUVITE-LUTEIN) CAPS capsule Take 1 capsule by mouth 2 (two) times daily.    polyethylene glycol (MIRALAX / GLYCOLAX) 17 g packet Take 17 g by mouth daily as needed.   polyvinyl alcohol (LIQUIFILM TEARS) 1.4 % ophthalmic solution Place 1 drop into both eyes in the morning, at noon, in the evening, and at bedtime.   rOPINIRole (REQUIP) 1 MG tablet TAKE 1 TABLET EACH EVENING.   sennosides-docusate sodium (SENOKOT-S) 8.6-50 MG tablet Take 2  tablets by mouth daily.   zinc oxide 20 % ointment Apply 1 application topically as needed for irritation. Special Instructions: To buttocks after every incontinent episode and as needed for redness. May keep at bedside.   No facility-administered encounter medications on file as of 04/07/2021.    Review of Systems  Constitutional:  Negative for activity change, appetite change, fatigue and fever.  Respiratory:  Negative for cough, shortness of breath and wheezing.   Cardiovascular:  Positive for leg swelling. Negative for chest pain.  Genitourinary:  Positive for dysuria, frequency and urgency. Negative for hematuria, vaginal discharge and vaginal pain.  Psychiatric/Behavioral:  Negative for dysphoric mood. The patient is not nervous/anxious.    Immunization History  Administered Date(s) Administered   Influenza, High Dose Seasonal PF 07/10/2017, 07/15/2019, 07/05/2020   Influenza,inj,Quad PF,6+ Mos 07/03/2018   Influenza-Unspecified 07/10/2017   Moderna Sars-Covid-2 Vaccination 10/05/2019, 11/02/2019, 08/15/2020   Pneumococcal Conjugate-13 08/18/2014   Pneumococcal Polysaccharide-23 07/26/2010   Tdap 11/12/2018   Pertinent  Health Maintenance Due  Topic Date Due   INFLUENZA VACCINE  05/01/2021   PNA vac Low Risk Adult  Completed   DEXA SCAN  Discontinued   Fall Risk  08/12/2019 07/23/2019 07/15/2019 04/15/2019 09/02/2018  Falls in the past year? 0 0 0 0 0  Number falls in past yr: 0 0 0 0 0  Injury with Fall? - 0 - 0 0   Functional Status Survey:    Vitals:   04/07/21 1307  BP: 124/80  Pulse: 74  Resp: 18  Temp: (!) 97.1 F (36.2 C)  SpO2: 92%  Weight: 124 lb 6.4 oz (56.4 kg)  Height: 5' (1.524 m)   Body mass index is 24.3 kg/m. Physical Exam Vitals reviewed. Exam conducted with a chaperone present.  Constitutional:      General: She is not in acute distress. Cardiovascular:     Rate and Rhythm: Normal rate and regular rhythm.     Pulses: Normal pulses.      Heart sounds: Normal heart sounds. No murmur heard. Pulmonary:     Effort: Pulmonary effort is normal. No respiratory distress.     Breath sounds: Normal breath sounds. No wheezing.  Abdominal:     General: Bowel sounds are normal. There is no distension.     Palpations: Abdomen is soft.     Tenderness: There is no abdominal tenderness.  Genitourinary:    Labia:        Right: No rash, tenderness or lesion.        Left: No rash, tenderness or lesion.      Urethra: No urethral pain, urethral swelling or urethral lesion.  Neurological:     Mental Status: She is alert.  Psychiatric:        Attention and Perception: Attention normal.  Mood and Affect: Mood normal.    Labs reviewed: Recent Labs    09/20/20 0000  NA 140  K 4.0  CL 105  CO2 29*  BUN 26*  CREATININE 1.1  CALCIUM 9.2   Recent Labs    09/20/20 0000  AST 13  ALT 6*  ALKPHOS 61  ALBUMIN 3.5   Recent Labs    09/20/20 0000  WBC 6.1  NEUTROABS 2,879.00  HGB 12.2  HCT 36  PLT 207   Lab Results  Component Value Date   TSH 1.66 09/20/2020   No results found for: HGBA1C Lab Results  Component Value Date   CHOL 175 07/23/2019   HDL 54 07/23/2019   LDLCALC 105 (H) 07/23/2019   TRIG 74 07/23/2019   CHOLHDL 3.2 07/23/2019    Significant Diagnostic Results in last 30 days:  No results found.  Assessment/Plan 1. Dysuria - descries burning pain near labia when she voids - pyridium unsuccessful - recent UA/culture negative for growth - diflucan 150 mg po once  2. Urinary urgency - ongoing, UA/culture negative for growth - son admits her sister had severe interstitial cystitis - will try macrobid 100 mg po daily x 7 days for possible placebo effect    Family/ staff Communication: plan discussed with patient, son and nurse  Labs/tests ordered:  none

## 2021-04-15 LAB — HEPATIC FUNCTION PANEL
ALT: 6 — AB (ref 7–35)
AST: 11 — AB (ref 13–35)
Alkaline Phosphatase: 58 (ref 25–125)
Bilirubin, Direct: 0.1 (ref 0.01–0.4)
Bilirubin, Total: 0.5

## 2021-04-15 LAB — CBC AND DIFFERENTIAL
HCT: 34 — AB (ref 36–46)
Hemoglobin: 11.3 — AB (ref 12.0–16.0)
Neutrophils Absolute: 4032
Platelets: 196 (ref 150–399)
WBC: 8

## 2021-04-15 LAB — COMPREHENSIVE METABOLIC PANEL
Albumin: 3.1 — AB (ref 3.5–5.0)
Calcium: 9 (ref 8.7–10.7)
Globulin: 2.8

## 2021-04-15 LAB — BASIC METABOLIC PANEL
BUN: 26 — AB (ref 4–21)
CO2: 29 — AB (ref 13–22)
Chloride: 104 (ref 99–108)
Creatinine: 0.8 (ref 0.5–1.1)
Glucose: 77
Potassium: 3.9 (ref 3.4–5.3)
Sodium: 139 (ref 137–147)

## 2021-04-15 LAB — CBC: RBC: 3.31 — AB (ref 3.87–5.11)

## 2021-04-27 ENCOUNTER — Encounter: Payer: Self-pay | Admitting: Internal Medicine

## 2021-04-27 ENCOUNTER — Non-Acute Institutional Stay (SKILLED_NURSING_FACILITY): Payer: PPO | Admitting: Internal Medicine

## 2021-04-27 DIAGNOSIS — R3915 Urgency of urination: Secondary | ICD-10-CM | POA: Diagnosis not present

## 2021-04-27 DIAGNOSIS — R269 Unspecified abnormalities of gait and mobility: Secondary | ICD-10-CM | POA: Diagnosis not present

## 2021-04-27 DIAGNOSIS — N1832 Chronic kidney disease, stage 3b: Secondary | ICD-10-CM

## 2021-04-27 DIAGNOSIS — R634 Abnormal weight loss: Secondary | ICD-10-CM

## 2021-04-27 DIAGNOSIS — M159 Polyosteoarthritis, unspecified: Secondary | ICD-10-CM

## 2021-04-27 DIAGNOSIS — M8949 Other hypertrophic osteoarthropathy, multiple sites: Secondary | ICD-10-CM | POA: Diagnosis not present

## 2021-04-27 DIAGNOSIS — I10 Essential (primary) hypertension: Secondary | ICD-10-CM

## 2021-04-27 DIAGNOSIS — G2581 Restless legs syndrome: Secondary | ICD-10-CM | POA: Diagnosis not present

## 2021-04-27 NOTE — Progress Notes (Signed)
Location:   [place] Room Number: 1 Place of Service:  SNF (31) Provider:  Veleta Miners MD   Mast, Man X, NP  Patient Care Team: Mast, Man X, NP as PCP - General (Internal Medicine) Wardell Honour, MD as Attending Physician (Family Medicine)  Extended Emergency Contact Information Primary Emergency Contact: [facility] Address: [place] [address] [place] Phone: [phone] Mobile Phone: [phone] Relation: Son Secondary Emergency Contact: [facility] Address: [address]          [place], [place] [address] [place] Phone: [phone] Relation: Son  Code Status:  DNR Palliative Care Managed Care Goals of care: Advanced Directive information Advanced Directives 04/27/2021  Does Patient Have a Medical Advance Directive? Yes  Type of Paramedic of [place];Living will;Out of facility DNR (pink MOST or yellow form)  Does patient want to make changes to medical advance directive? No - Patient declined  Copy of [place] in Chart? Yes - validated most recent copy scanned in chart (See row information)  Would patient like information on creating a medical advance directive? -  Pre-existing out of facility DNR order (yellow form or pink MOST form) Yellow form placed in chart (order not valid for inpatient use);Pink MOST form placed in chart (order not valid for inpatient use)     Chief Complaint  Patient presents with   Medical Management of Chronic Issues   Health Maintenance    Shingrix    HPI:  Pt is a 87 y.o. female seen today for medical management of chronic diseases.    Patient has h/o Hypertension, Anxiety, Restless leg Syndrome, Insomnia, Arthritis and Posterior Tendon Dysfunction with right foot Valgus in Right Knee with inability to Ambulate  Doing well. Her 2 active issues are Urinary issues Patient states that at  night it it hurts when she is trying to Urinate  She feels like her bladder is full but it takes forever for her to empty her bladder Her Urine Culture is negative Amy treated her with Macrobid to see if it will help  But she says her symptoms are persisting Weight loss and Poor Appetite Patietn says she does not like the food here Does not like supplements Has lost 5 lbs since my last visit On Remeron at night  No Other issues Uses Power Wheelchair Cannot do her transfers  Past Medical History:  Diagnosis Date   Anemia    Hypertension    OA (osteoarthritis)    Osteoporosis    Renal cyst    Past Surgical History:  Procedure Laterality Date   APPENDECTOMY     BLADDER SUSPENSION     CATARACT EXTRACTION     CESAREAN SECTION     REPLACEMENT TOTAL KNEE  1995   TONSILLECTOMY      No Known Allergies  Allergies as of 04/27/2021   No Known Allergies      Medication List        Accurate as of April 27, 2021  8:55 AM. If you have any questions, ask your nurse or doctor.          acetaminophen 325 MG tablet Commonly known as: TYLENOL Take 650 mg by mouth every 4 (four) hours as needed.   amLODipine 2.5 MG tablet Commonly known as: NORVASC Take 2.5 mg by mouth daily.   aspirin EC 81 MG tablet Take  81 mg by mouth. Once A Morning on Mon, Thu   Biotin 5000 MCG Tabs Take 5,000 mcg by mouth daily.   bisacodyl 10 MG suppository Commonly known as: DULCOLAX Place 10 mg rectally every other day.   Cranberry 450 MG Tabs Take 1 tablet by mouth daily.   docusate sodium 100 MG capsule Commonly known as: COLACE Take 100 mg by mouth daily.   KETOCONAZOLE (TOPICAL) 1 % Sham Apply 1 application topically. Special Instructions: wash hair 2x a week   magnesium hydroxide 400 MG/5ML suspension Commonly known as: MILK OF MAGNESIA Take 30 mLs by mouth daily as needed for mild constipation.   metoprolol tartrate 50 MG tablet Commonly known as: LOPRESSOR TAKE 1 TABLET BY  MOUTH TWICE DAILY.   mirtazapine 7.5 MG tablet Commonly known as: REMERON TAKE ONE TABLET AT BEDTIME.   multivitamin-lutein Caps capsule Take 1 capsule by mouth 2 (two) times daily.   nitrofurantoin (macrocrystal-monohydrate) 100 MG capsule Commonly known as: MACROBID Take 100 mg by mouth daily at 6 (six) AM.   polyethylene glycol 17 g packet Commonly known as: MIRALAX / GLYCOLAX Take 17 g by mouth daily as needed.   polyvinyl alcohol 1.4 % ophthalmic solution Commonly known as: LIQUIFILM TEARS Place 1 drop into both eyes in the morning, at noon, in the evening, and at bedtime.   rOPINIRole 1 MG tablet Commonly known as: REQUIP TAKE 1 TABLET EACH EVENING.   sennosides-docusate sodium 8.6-50 MG tablet Commonly known as: SENOKOT-S Take 2 tablets by mouth daily.   Vitamin D 50 MCG (2000 UT) Caps Take 2,000 Units by mouth daily.   zinc oxide 20 % ointment Apply 1 application topically as needed for irritation. Special Instructions: To buttocks after every incontinent episode and as needed for redness. May keep at bedside.        Review of Systems  Constitutional:  Positive for appetite change and unexpected weight change.  HENT: Negative.    Respiratory: Negative.    Cardiovascular:  Positive for leg swelling.  Gastrointestinal:  Positive for constipation.  Genitourinary:  Positive for dysuria and urgency.  Musculoskeletal:  Positive for gait problem.  Neurological:  Negative for dizziness.  Psychiatric/Behavioral: Negative.     Immunization History  Administered Date(s) Administered   Influenza, High Dose Seasonal PF 07/10/2017, 07/15/2019, 07/05/2020   Influenza,inj,Quad PF,6+ Mos 07/03/2018   Influenza-Unspecified 07/10/2017   Moderna SARS-COV2 Booster Vaccination 03/08/2021   Moderna Sars-Covid-2 Vaccination 10/05/2019, 11/02/2019, 08/15/2020   Pneumococcal Conjugate-13 08/18/2014   Pneumococcal Polysaccharide-23 07/26/2010   Tdap 11/12/2018   Pertinent   Health Maintenance Due  Topic Date Due   INFLUENZA VACCINE  05/01/2021   PNA vac Low Risk Adult  Completed   DEXA SCAN  Discontinued   Fall Risk  08/12/2019 07/23/2019 07/15/2019 04/15/2019 09/02/2018  Falls in the past year? 0 0 0 0 0  Number falls in past yr: 0 0 0 0 0  Injury with Fall? - 0 - 0 0   Functional Status Survey:    Vitals:   04/27/21 0846  BP: 108/72  Pulse: 76  Resp: 20  Temp: (!) 96.6 F (35.9 C)  SpO2: 94%  Weight: 122 lb 6.4 oz (55.5 kg)  Height: 5' (1.524 m)   Body mass index is 23.9 kg/m. Physical Exam Constitutional: Oriented to person, place, and time. Well-developed and well-nourished.  HENT:  Head: Normocephalic.  Mouth/Throat: Oropharynx is clear and moist.  Eyes: Pupils are equal, round, and reactive to light.  Neck: Neck  supple.  Cardiovascular: Normal rate and normal heart sounds.  No murmur heard. Pulmonary/Chest: Effort normal and breath sounds normal. No respiratory distress. No wheezes. She has no rales.  Abdominal: Soft. Bowel sounds are normal. No distension. There is no tenderness. There is no rebound.  Musculoskeletal: . Mild swelling Bilateral Right more then Left  With Chronic Changes Lymphadenopathy: none Neurological: Alert and oriented to person, place, and time.  Skin: Skin is warm and dry.  Psychiatric: Normal mood and affect. Behavior is normal. Thought content normal.   Labs reviewed: Recent Labs    09/20/20 0000 04/15/21 0000  NA 140 139  K 4.0 3.9  CL 105 104  CO2 29* 29*  BUN 26* 26*  CREATININE 1.1 0.8  CALCIUM 9.2 9.0   Recent Labs    09/20/20 0000 04/15/21 0000  AST 13 11*  ALT 6* 6*  ALKPHOS 61 58  ALBUMIN 3.5 3.1*   Recent Labs    09/20/20 0000 04/15/21 0000  WBC 6.1 8.0  NEUTROABS 2,879.00 4,032.00  HGB 12.2 11.3*  HCT 36 34*  PLT 207 196   Lab Results  Component Value Date   TSH 1.66 09/20/2020   No results found for: HGBA1C Lab Results  Component Value Date   CHOL 175 07/23/2019    HDL 54 07/23/2019   LDLCALC 105 (H) 07/23/2019   TRIG 74 07/23/2019   CHOLHDL 3.2 07/23/2019    Significant Diagnostic Results in last 30 days:  No results found.  Assessment/Plan Urinary urgency Culture Negative ? Atonic Bladder Will Try Flomax 0.4 mg QHS for 4 weeks then Reval Continue encouraging Fluids D/W Dr Sabra Heck her POA  Essential hypertension BP on Lower side for past few weeks Will continue to monitor and see the trend On Norvasc and Toprol  Primary osteoarthritis involving multiple joints Tylenol PRN  RLS (restless legs syndrome) On Requip Weight loss On Remeron Does not want any Supplements right now Gait abnormality Uses Power Chair    Family/ staff Communication:   Labs/tests ordered:

## 2021-05-05 DIAGNOSIS — M6281 Muscle weakness (generalized): Secondary | ICD-10-CM | POA: Diagnosis not present

## 2021-05-05 DIAGNOSIS — R293 Abnormal posture: Secondary | ICD-10-CM | POA: Diagnosis not present

## 2021-05-19 ENCOUNTER — Encounter: Payer: Self-pay | Admitting: Orthopedic Surgery

## 2021-05-19 ENCOUNTER — Non-Acute Institutional Stay (SKILLED_NURSING_FACILITY): Payer: PPO | Admitting: Orthopedic Surgery

## 2021-05-19 DIAGNOSIS — R3915 Urgency of urination: Secondary | ICD-10-CM | POA: Diagnosis not present

## 2021-05-19 DIAGNOSIS — M8949 Other hypertrophic osteoarthropathy, multiple sites: Secondary | ICD-10-CM

## 2021-05-19 DIAGNOSIS — U071 COVID-19: Secondary | ICD-10-CM

## 2021-05-19 DIAGNOSIS — R269 Unspecified abnormalities of gait and mobility: Secondary | ICD-10-CM

## 2021-05-19 DIAGNOSIS — R634 Abnormal weight loss: Secondary | ICD-10-CM | POA: Diagnosis not present

## 2021-05-19 DIAGNOSIS — G2581 Restless legs syndrome: Secondary | ICD-10-CM | POA: Diagnosis not present

## 2021-05-19 DIAGNOSIS — M159 Polyosteoarthritis, unspecified: Secondary | ICD-10-CM

## 2021-05-19 DIAGNOSIS — M150 Primary generalized (osteo)arthritis: Secondary | ICD-10-CM

## 2021-05-19 DIAGNOSIS — I10 Essential (primary) hypertension: Secondary | ICD-10-CM | POA: Diagnosis not present

## 2021-05-19 NOTE — Progress Notes (Signed)
Location:   [place] Room Number: N01 Place of Service:  SNF (31) Provider:  Windell Moulding, NP    Patient Care Team: Mast, Man X, NP as PCP - General (Internal Medicine) Wardell Honour, MD as Attending Physician (Family Medicine)  Extended Emergency Contact Information Primary Emergency Contact: [facility] Address: [place] [address] [place] Phone: [phone] Mobile Phone: [phone] Relation: Son Secondary Emergency Contact: [facility] Address: [address]          [place], [place] [address] [place] Phone: [phone] Relation: Son  Code Status:  DNR Goals of care: Advanced Directive information Advanced Directives 05/19/2021  Does Patient Have a Medical Advance Directive? Yes  Type of Paramedic of [place];Living will;Out of facility DNR (pink MOST or yellow form)  Does patient want to make changes to medical advance directive? No - Patient declined  Copy of [place] in Chart? Yes - validated most recent copy scanned in chart (See row information)  Would patient like information on creating a medical advance directive? -  Pre-existing out of facility DNR order (yellow form or pink MOST form) Yellow form placed in chart (order not valid for inpatient use);Pink MOST form placed in chart (order not valid for inpatient use)     Chief Complaint  Patient presents with   Medical Management of Chronic Issues    Routine follow up    Health Maintenance    Discuss need for shingles vaccine and updated influenza vaccine.     HPI:  Pt is a 87 y.o. female seen today for medical management of chronic diseases.    She continues to reside on the skilled nursing unit at [facility]. Past medical history includes: hypertension, PVD, osteoarthritis of multiple joints, CKD stage 3, posterior tendon dysfunction with right foot drop,  constipation, restless leg syndrome and anxiety.   She just celebrated her 89th birthday. Her family rented a Printmaker and they were able to have a gathering at a family members home.   Urinary urgency- ongoing, also reports intermittent dysuria today, son reports her younger sister had severe interstitial cystitis, she cannot tell a difference since starting Flomax 3 weeks ago, urine culture 06/08 no growth, tried diflucan 150 mg once and few weeks of macrobid 100 mg daily without success, she is encouraged to drink more water but does not HTN- BUN/creat 26/0.8 04/15/2021, amlodipine 2.5 mg daily OA- reports right leg, shoulder and back pain, pain worse in the AM, improves throughout the day, tylenol 650 po prn RLS- she is able to sleep at night, requip 1 mg qhs Weight loss- losing about 1 lbs per month, she dislikes the food served at [employer], remeron 7.5 mg qhs Gait abnormality- uses power wheelchair due to right foot drop and right tibial tendon abnormality, no recent injuries, seen leaving room often Covid 19- tested positive 05/02/2021, symptoms minimal, placed on 10 day isolation, she reports isolation was worst part of covid  No recent falls, injuries or behavioral outbursts.   Recent blood pressures:  08/16- 135/79  08/12- 102/71  08/10- 118/65  Recent weights:  08/02- 123.3 lbs  07/06- 124.4 lbs  06/01- 126.4 lbs  05/03- 127.6 lbs  Nurse does not report any other concerns, vitals stable.     Past Medical History:  Diagnosis Date   Anemia    Hypertension    OA (  osteoarthritis)    Osteoporosis    Renal cyst    Past Surgical History:  Procedure Laterality Date   APPENDECTOMY     BLADDER SUSPENSION     CATARACT EXTRACTION     CESAREAN SECTION     REPLACEMENT TOTAL KNEE  1995   TONSILLECTOMY      No Known Allergies  Allergies as of 05/19/2021   No Known Allergies      Medication List        Accurate as of May 19, 2021  9:34 AM. If you have any questions,  ask your nurse or doctor.          STOP taking these medications    nitrofurantoin (macrocrystal-monohydrate) 100 MG capsule Commonly known as: MACROBID Stopped by: Yvonna Alanis, NP       TAKE these medications    acetaminophen 325 MG tablet Commonly known as: TYLENOL Take 650 mg by mouth every 4 (four) hours as needed.   amLODipine 2.5 MG tablet Commonly known as: NORVASC Take 2.5 mg by mouth daily.   aspirin EC 81 MG tablet Take 81 mg by mouth. Once A Morning on Mon, Thu   Biotin 5000 MCG Tabs Take 5,000 mcg by mouth daily.   bisacodyl 10 MG suppository Commonly known as: DULCOLAX Place 10 mg rectally every other day.   Cranberry 450 MG Tabs Take 1 tablet by mouth daily.   docusate sodium 100 MG capsule Commonly known as: COLACE Take 100 mg by mouth daily.   KETOCONAZOLE (TOPICAL) 1 % Sham Apply 1 application topically. Special Instructions: wash hair 2x a week   magnesium hydroxide 400 MG/5ML suspension Commonly known as: MILK OF MAGNESIA Take 30 mLs by mouth daily as needed for mild constipation.   metoprolol tartrate 50 MG tablet Commonly known as: LOPRESSOR TAKE 1 TABLET BY MOUTH TWICE DAILY.   mirtazapine 7.5 MG tablet Commonly known as: REMERON TAKE ONE TABLET AT BEDTIME.   multivitamin-lutein Caps capsule Take 1 capsule by mouth 2 (two) times daily.   polyethylene glycol 17 g packet Commonly known as: MIRALAX / GLYCOLAX Take 17 g by mouth daily as needed.   polyvinyl alcohol 1.4 % ophthalmic solution Commonly known as: LIQUIFILM TEARS Place 1 drop into both eyes in the morning, at noon, in the evening, and at bedtime.   rOPINIRole 1 MG tablet Commonly known as: REQUIP TAKE 1 TABLET EACH EVENING.   sennosides-docusate sodium 8.6-50 MG tablet Commonly known as: SENOKOT-S Take 2 tablets by mouth daily.   tamsulosin 0.4 MG Caps capsule Commonly known as: FLOMAX Take 0.4 mg by mouth at bedtime.   Vitamin D 50 MCG (2000 UT) Caps Take  2,000 Units by mouth daily.   zinc oxide 20 % ointment Apply 1 application topically as needed for irritation. Special Instructions: To buttocks after every incontinent episode and as needed for redness. May keep at bedside.        Review of Systems  Constitutional:  Negative for activity change, appetite change, fatigue and fever.  HENT:  Positive for hearing loss. Negative for dental problem and trouble swallowing.        Hearing aids  Eyes:  Negative for visual disturbance.       Glasses  Respiratory:  Negative for cough, shortness of breath and wheezing.   Cardiovascular:  Positive for leg swelling. Negative for chest pain.  Gastrointestinal:  Positive for constipation. Negative for abdominal distention, abdominal pain, diarrhea and nausea.  Genitourinary:  Positive for dysuria,  frequency and urgency. Negative for hematuria and vaginal discharge.  Musculoskeletal:  Positive for arthralgias, back pain, gait problem and myalgias.  Skin: Negative.   Neurological:  Positive for weakness and numbness. Negative for dizziness and headaches.  Psychiatric/Behavioral:  Negative for confusion, dysphoric mood and sleep disturbance. The patient is not nervous/anxious.    Immunization History  Administered Date(s) Administered   Influenza, High Dose Seasonal PF 07/10/2017, 07/15/2019, 07/05/2020   Influenza,inj,Quad PF,6+ Mos 07/03/2018   Influenza-Unspecified 07/10/2017   Moderna SARS-COV2 Booster Vaccination 03/08/2021   Moderna Sars-Covid-2 Vaccination 10/05/2019, 11/02/2019, 08/15/2020   Pneumococcal Conjugate-13 08/18/2014   Pneumococcal Polysaccharide-23 07/26/2010   Tdap 11/12/2018   Pertinent  Health Maintenance Due  Topic Date Due   INFLUENZA VACCINE  05/01/2021   PNA vac Low Risk Adult  Completed   DEXA SCAN  Discontinued   Fall Risk  08/12/2019 07/23/2019 07/15/2019 04/15/2019 09/02/2018  Falls in the past year? 0 0 0 0 0  Number falls in past yr: 0 0 0 0 0  Injury with  Fall? - 0 - 0 0   Functional Status Survey:    Vitals:   05/19/21 0927  BP: 135/79  Pulse: 82  Resp: 20  Temp: (!) 97 F (36.1 C)  SpO2: 95%  Weight: 124 lb 6.4 oz (56.4 kg)  Height: 5' (1.524 m)   Body mass index is 24.3 kg/m. Physical Exam Vitals reviewed.  Constitutional:      General: She is not in acute distress. HENT:     Head: Normocephalic.     Right Ear: There is no impacted cerumen.     Left Ear: There is no impacted cerumen.     Nose: Nose normal.     Mouth/Throat:     Mouth: Mucous membranes are moist.  Eyes:     General:        Right eye: No discharge.        Left eye: No discharge.  Cardiovascular:     Rate and Rhythm: Normal rate and regular rhythm.     Pulses: Normal pulses.     Heart sounds: Normal heart sounds. No murmur heard. Pulmonary:     Effort: Pulmonary effort is normal. No respiratory distress.     Breath sounds: Normal breath sounds. No wheezing.  Abdominal:     General: Bowel sounds are normal.     Palpations: Abdomen is soft.     Tenderness: There is no abdominal tenderness.  Musculoskeletal:     Cervical back: Normal range of motion.     Right lower leg: Edema present.     Left lower leg: Edema present.     Comments: Non-pitting edema, right foot drop present, forward neck protrusion and mild kyphosis noted.   Lymphadenopathy:     Cervical: No cervical adenopathy.  Skin:    General: Skin is warm and dry.     Capillary Refill: Capillary refill takes less than 2 seconds.  Neurological:     General: No focal deficit present.     Mental Status: She is alert and oriented to person, place, and time.     Motor: Weakness present.     Gait: Gait abnormal.     Comments: Power wheelchair  Psychiatric:        Mood and Affect: Mood normal.        Behavior: Behavior normal.    Labs reviewed: Recent Labs    09/20/20 0000 04/15/21 0000  NA 140 139  K 4.0 3.9  CL 105 104  CO2 29* 29*  BUN 26* 26*  CREATININE 1.1 0.8  CALCIUM 9.2  9.0   Recent Labs    09/20/20 0000 04/15/21 0000  AST 13 11*  ALT 6* 6*  ALKPHOS 61 58  ALBUMIN 3.5 3.1*   Recent Labs    09/20/20 0000 04/15/21 0000  WBC 6.1 8.0  NEUTROABS 2,879.00 4,032.00  HGB 12.2 11.3*  HCT 36 34*  PLT 207 196   Lab Results  Component Value Date   TSH 1.66 09/20/2020   No results found for: HGBA1C Lab Results  Component Value Date   CHOL 175 07/23/2019   HDL 54 07/23/2019   LDLCALC 105 (H) 07/23/2019   TRIG 74 07/23/2019   CHOLHDL 3.2 07/23/2019    Significant Diagnostic Results in last 30 days:  No results found.  Assessment/Plan 1. Urinary urgency - ongoing, reports intermittent dysuria - Flomax added 3 weeks ago, will continue for one more week to see if effective - advised to hydrate with water  2. Essential hypertension - controlled - cont amlodipine 2.5 mg daily  3. Primary osteoarthritis involving multiple joints - reports pain to right foot, shoulders and back - diagnosed with right tibial tendon disorder in past - right foot drop present - cont tylenol 650 mg prn - discussed Voltaren gel and Biofreeze topical ointments- she was not interested at this time  4. RLS (restless legs syndrome) - stable with Requip 1 mg qhs  5. Weight loss - losing 1 lb per month - she does not like food offered - cont Remeron 7.5 mg qhs  6. Gait abnormality - does well with power wheelchair  7. COVID-19 - diagnosed 05/02/2021 - asymptomatic - resolved at this time - she has received covid vaccine and both boosters    Family/ staff Communication: plan discussed with patient and nurse  Labs/tests ordered:  none

## 2021-05-31 ENCOUNTER — Other Ambulatory Visit: Payer: Self-pay | Admitting: Orthopedic Surgery

## 2021-05-31 DIAGNOSIS — R300 Dysuria: Secondary | ICD-10-CM

## 2021-05-31 MED ORDER — URIBEL 118 MG PO CAPS: 1.0000 | ORAL_CAPSULE | Freq: Two times a day (BID) | ORAL | 0 refills | Status: DC

## 2021-06-16 ENCOUNTER — Encounter: Payer: Self-pay | Admitting: Orthopedic Surgery

## 2021-06-16 ENCOUNTER — Non-Acute Institutional Stay (SKILLED_NURSING_FACILITY): Payer: PPO | Admitting: Orthopedic Surgery

## 2021-06-16 DIAGNOSIS — H02135 Senile ectropion of left lower eyelid: Secondary | ICD-10-CM

## 2021-06-16 DIAGNOSIS — G2581 Restless legs syndrome: Secondary | ICD-10-CM | POA: Diagnosis not present

## 2021-06-16 DIAGNOSIS — R269 Unspecified abnormalities of gait and mobility: Secondary | ICD-10-CM | POA: Diagnosis not present

## 2021-06-16 DIAGNOSIS — I10 Essential (primary) hypertension: Secondary | ICD-10-CM | POA: Diagnosis not present

## 2021-06-16 DIAGNOSIS — M8949 Other hypertrophic osteoarthropathy, multiple sites: Secondary | ICD-10-CM | POA: Diagnosis not present

## 2021-06-16 DIAGNOSIS — R3915 Urgency of urination: Secondary | ICD-10-CM | POA: Diagnosis not present

## 2021-06-16 DIAGNOSIS — R634 Abnormal weight loss: Secondary | ICD-10-CM | POA: Diagnosis not present

## 2021-06-16 DIAGNOSIS — M159 Polyosteoarthritis, unspecified: Secondary | ICD-10-CM

## 2021-06-16 NOTE — Progress Notes (Signed)
Location:  [place] Room Number: N01 Place of Service:  SNF (31) Provider:  Windell Moulding, AGNP-C  Mast, Man X, NP  Patient Care Team: Mast, Man X, NP as PCP - General (Internal Medicine) Wardell Honour, MD as Attending Physician (Family Medicine)  Extended Emergency Contact Information Primary Emergency Contact: [facility] Address: [place] [address] [place] Phone: [phone] Mobile Phone: [phone] Relation: Son Secondary Emergency Contact: [facility] Address: [address]          [place], [place] [address] [place] Phone: [phone] Relation: Son  Code Status:  DNR Goals of care: Advanced Directive information Advanced Directives 05/19/2021  Does Patient Have a Medical Advance Directive? Yes  Type of Paramedic of [place];Living will;Out of facility DNR (pink MOST or yellow form)  Does patient want to make changes to medical advance directive? No - Patient declined  Copy of [place] in Chart? Yes - validated most recent copy scanned in chart (See row information)  Would patient like information on creating a medical advance directive? -  Pre-existing out of facility DNR order (yellow form or pink MOST form) Yellow form placed in chart (order not valid for inpatient use);Pink MOST form placed in chart (order not valid for inpatient use)     Chief Complaint  Patient presents with   Medical Management of Chronic Issues    HPI:  Pt is a 87 y.o. female seen today for medical management of chronic diseases.    She continues to reside on the skilled nursing unit at [facility]. Past medical history includes: hypertension, PVD, osteoarthritis of multiple joints, CKD stage 3, posterior tendon dysfunction with right foot drop, constipation, restless leg syndrome and anxiety.   Urinary urgency- ongoing, she was started on  Uribel 08/31 for 2 weeks, she cannot tell if medication has helped urgency and dysuria,  urine culture 06/08 no growth, tried diflucan 150 mg once and few weeks of macrobid 100 mg daily without success, she is encouraged to drink more water but does not HTN- BUN/creat 26/0.8 04/15/2021, amlodipine 2.5 mg daily OA- reports right leg, shoulder and back pain, pain worse in the AM, improves throughout the day, tylenol 650 po prn RLS- she is able to sleep at night, requip 1 mg qhs Weight loss/poor appetite- losing about 1 lbs per month, she dislikes the food served at [employer], remeron 7.5 mg qhs Gait abnormality- uses power wheelchair due to right foot drop and right tibial tendon abnormality, no recent injuries, seen leaving room often  No recent falls or injuries. Ambulates with PWC, eats meals with others and can be seen socializing with other residents daily.   Recent blood pressures:  09/13- 128/84  09/06- 120/87  08/30- 102/66  Recent weights:  09/07- 123.9 lbs  08/02- 123.3 lbs  07/06- 124.4 lbs  Nurse does not report any other concerns, vitals stable.   Past Medical History:  Diagnosis Date   Anemia    Hypertension    OA (osteoarthritis)    Osteoporosis    Renal cyst    Past Surgical History:  Procedure Laterality Date   APPENDECTOMY     BLADDER SUSPENSION     CATARACT EXTRACTION     CESAREAN SECTION     REPLACEMENT TOTAL KNEE  1995   TONSILLECTOMY      No Known Allergies  Outpatient Encounter  Medications as of 06/16/2021  Medication Sig   acetaminophen (TYLENOL) 325 MG tablet Take 650 mg by mouth every 4 (four) hours as needed.   amLODipine (NORVASC) 2.5 MG tablet Take 2.5 mg by mouth daily.   aspirin EC 81 MG tablet Take 81 mg by mouth. Once A Morning on Mon, Thu   Biotin 5000 MCG TABS Take 5,000 mcg by mouth daily.    bisacodyl (DULCOLAX) 10 MG suppository Place 10 mg rectally every other day.   Cholecalciferol (VITAMIN D) 50 MCG (2000 UT) CAPS Take 2,000 Units by  mouth daily.    Cranberry 450 MG TABS Take 1 tablet by mouth daily.   docusate sodium (COLACE) 100 MG capsule Take 100 mg by mouth daily.   KETOCONAZOLE, TOPICAL, 1 % SHAM Apply 1 application topically. Special Instructions: wash hair 2x a week   magnesium hydroxide (MILK OF MAGNESIA) 400 MG/5ML suspension Take 30 mLs by mouth daily as needed for mild constipation.   Meth-Hyo-M Bl-Na Phos-Ph Sal (URIBEL) 118 MG CAPS Take 1 capsule (118 mg total) by mouth in the morning and at bedtime.   metoprolol tartrate (LOPRESSOR) 50 MG tablet TAKE 1 TABLET BY MOUTH TWICE DAILY.   mirtazapine (REMERON) 7.5 MG tablet TAKE ONE TABLET AT BEDTIME.   multivitamin-lutein (OCUVITE-LUTEIN) CAPS capsule Take 1 capsule by mouth 2 (two) times daily.    polyethylene glycol (MIRALAX / GLYCOLAX) 17 g packet Take 17 g by mouth daily as needed.   polyvinyl alcohol (LIQUIFILM TEARS) 1.4 % ophthalmic solution Place 1 drop into both eyes in the morning, at noon, in the evening, and at bedtime.   rOPINIRole (REQUIP) 1 MG tablet TAKE 1 TABLET EACH EVENING.   sennosides-docusate sodium (SENOKOT-S) 8.6-50 MG tablet Take 2 tablets by mouth daily.   tamsulosin (FLOMAX) 0.4 MG CAPS capsule Take 0.4 mg by mouth at bedtime.   zinc oxide 20 % ointment Apply 1 application topically as needed for irritation. Special Instructions: To buttocks after every incontinent episode and as needed for redness. May keep at bedside.   No facility-administered encounter medications on file as of 06/16/2021.    Review of Systems  Constitutional:  Negative for activity change, appetite change, fatigue and fever.  HENT:  Positive for hearing loss. Negative for trouble swallowing.        Hearing aid  Eyes:  Positive for visual disturbance.       Glasses  Respiratory:  Negative for cough, shortness of breath and wheezing.   Cardiovascular:  Positive for leg swelling. Negative for chest pain.  Gastrointestinal:  Negative for abdominal distention,  abdominal pain, constipation, diarrhea and nausea.  Genitourinary:  Positive for frequency and urgency. Negative for dysuria and hematuria.  Musculoskeletal:  Positive for arthralgias, gait problem and myalgias.  Skin: Negative.   Neurological:  Positive for weakness and numbness. Negative for dizziness, light-headedness and headaches.  Psychiatric/Behavioral:  Negative for confusion, dysphoric mood and sleep disturbance. The patient is not nervous/anxious.    Immunization History  Administered Date(s) Administered   Influenza, High Dose Seasonal PF 07/10/2017, 07/15/2019, 07/05/2020   Influenza,inj,Quad PF,6+ Mos 07/03/2018   Influenza-Unspecified 07/10/2017   Moderna SARS-COV2 Booster Vaccination 03/08/2021   Moderna Sars-Covid-2 Vaccination 10/05/2019, 11/02/2019, 08/15/2020   Pneumococcal Conjugate-13 08/18/2014   Pneumococcal Polysaccharide-23 07/26/2010   Tdap 11/12/2018   Pertinent  Health Maintenance Due  Topic Date Due   INFLUENZA VACCINE  05/01/2021   DEXA SCAN  Discontinued   Fall Risk  08/12/2019 07/23/2019 07/15/2019 04/15/2019 09/02/2018  Falls in the past year? 0 0 0 0 0  Number falls in past yr: 0 0 0 0 0  Injury with Fall? - 0 - 0 0   Functional Status Survey:    Vitals:   06/16/21 1538  BP: 128/84  Pulse: 77  Resp: 18  Temp: (!) 97.1 F (36.2 C)  SpO2: 90%  Weight: 123 lb 14.4 oz (56.2 kg)  Height: 5' (1.524 m)   Body mass index is 24.2 kg/m. Physical Exam Vitals reviewed.  Constitutional:      General: She is not in acute distress. HENT:     Head: Normocephalic.     Right Ear: There is no impacted cerumen.     Left Ear: There is no impacted cerumen.     Nose: Nose normal.     Mouth/Throat:     Mouth: Mucous membranes are moist.  Eyes:     General:        Right eye: No discharge.        Left eye: No discharge.     Comments: Left eye ectropion   Neck:     Vascular: No carotid bruit.  Cardiovascular:     Rate and Rhythm: Normal rate and  regular rhythm.     Pulses: Normal pulses.     Heart sounds: Normal heart sounds. No murmur heard. Pulmonary:     Effort: Pulmonary effort is normal. No respiratory distress.     Breath sounds: Normal breath sounds. No wheezing.  Abdominal:     General: Bowel sounds are normal. There is no distension.     Palpations: Abdomen is soft.     Tenderness: There is no abdominal tenderness.  Musculoskeletal:     Cervical back: Normal range of motion.     Right lower leg: Edema present.     Left lower leg: Edema present.     Comments: Non-pitting edema, right foot drop present  Lymphadenopathy:     Cervical: No cervical adenopathy.  Skin:    General: Skin is warm and dry.     Capillary Refill: Capillary refill takes less than 2 seconds.  Neurological:     General: No focal deficit present.     Mental Status: She is alert and oriented to person, place, and time.     Motor: Weakness present.     Gait: Gait abnormal.     Comments: PWC  Psychiatric:        Mood and Affect: Mood normal.        Behavior: Behavior normal.    Labs reviewed: Recent Labs    09/20/20 0000 04/15/21 0000  NA 140 139  K 4.0 3.9  CL 105 104  CO2 29* 29*  BUN 26* 26*  CREATININE 1.1 0.8  CALCIUM 9.2 9.0   Recent Labs    09/20/20 0000 04/15/21 0000  AST 13 11*  ALT 6* 6*  ALKPHOS 61 58  ALBUMIN 3.5 3.1*   Recent Labs    09/20/20 0000 04/15/21 0000  WBC 6.1 8.0  NEUTROABS 2,879.00 4,032.00  HGB 12.2 11.3*  HCT 36 34*  PLT 207 196   Lab Results  Component Value Date   TSH 1.66 09/20/2020   No results found for: HGBA1C Lab Results  Component Value Date   CHOL 175 07/23/2019   HDL 54 07/23/2019   LDLCALC 105 (H) 07/23/2019   TRIG 74 07/23/2019   CHOLHDL 3.2 07/23/2019    Significant Diagnostic Results in last 30 days:  No  results found.  Assessment/Plan 1. Urinary urgency - ongoing, unsuccessful trial of macrobid, flomax and Uribel - will discuss treatment options with son -  advised to stay hydrated with water  2. Essential hypertension - controlled - cont metoprolol and norvasc  3. Primary osteoarthritis involving multiple joints - denies increased pain - cont tylenol prn  4. RLS (restless legs syndrome) - stable with Requip qhs  5. Weight loss - stable, weight unchanged from last month - cont remeron 7.5 mg qhs  6. Gait abnormality - uses PWC due to chronic right leg pain and foot drop - con skilled nursing care  7. Senile ectropion of left lower eyelid - cont liquid tears tid    Family/ staff Communication: plan discussed with patient and nurse  Labs/tests ordered:  none

## 2021-06-21 ENCOUNTER — Other Ambulatory Visit: Payer: Self-pay

## 2021-06-21 ENCOUNTER — Non-Acute Institutional Stay: Payer: PPO | Admitting: *Deleted

## 2021-06-21 DIAGNOSIS — Z515 Encounter for palliative care: Secondary | ICD-10-CM

## 2021-06-28 NOTE — Progress Notes (Signed)
[facility] COMMUNITY PALLIATIVE CARE RN NOTE  PATIENT NAME: Ann Jensen DOB: 1922-03-18 MRN: [id number]  PRIMARY CARE PROVIDER: Mast, Man X, NP  RESPONSIBLE PARTY: Alain Honey (son) Acct ID - Guarantor Home Phone Work Phone Relationship Acct Type  [id number] ASANI, [phone]  Self P/F     [place], [place], [place] [address]   Covid-19 Pre-screening Negative  PLAN OF CARE and INTERVENTION:  ADVANCE CARE PLANNING/GOALS OF CARE: Goal is for patient to remain on skilled unit at [facility]. She has a DNR. PATIENT/CAREGIVER EDUCATION: Symptom management, s/s of infection DISEASE STATUS: Follow-up palliative care visit completed. Met with patient outside on the breezeway. She is sitting up in her motorized chair awake and alert. She is pleasant and engaging. She is forgetful at times. She denies any pain or shortness of breath at time of visit. She has been having issues with pain/burning with urination. She has a history of frequent UTIs. Her urine culture has been negative. She was been tried on Macrobid, Pyridium, Diflucan, Flomax and Uribel due to persistent dysuria and urgency to see if this would help. She has not had any success with these. She is trying to drink more water to help, but states she knows she doesn't drink enough. Partially because she wears adult briefs and doesn't want to have to be changed several times a day. She says that when she takes Tylenol, these symptoms are not as bad. She says she feels that she has severe cystitis. She requires 1 person assistance with all ADLs, except feeding. She is transferred via [place] into her motorized chair which she uses to travel around the facility. She is non-ambulatory and unable to bear any weight. Her appetite is poor. Denies dysphagia. She takes her medications whole with water. She enjoys going outside on days when the weather permits. She does have lower extremity edema, but often times does not  like wearing her compression stockings. She will usually elevate her legs in her recliner after lunch. She has no additional concerns at this time.    HISTORY OF PRESENT ILLNESS: This is a 87 yo female with a past medication history of essential hypertension, hyperlipidemia, PVD, CKD Stage 3, frequent UTIs, right posterior tibial tendon dysfunction and osteoporosis. Palliative care team continues to follow patient for additional support.  CODE STATUS: DNR  ADVANCED DIRECTIVES: Y MOST FORM: yes PPS: 30%   (Duration of visit and documentation 30 minutes)   [name], RN BSN

## 2021-07-05 ENCOUNTER — Non-Acute Institutional Stay (SKILLED_NURSING_FACILITY): Payer: PPO | Admitting: Orthopedic Surgery

## 2021-07-05 ENCOUNTER — Encounter: Payer: Self-pay | Admitting: Orthopedic Surgery

## 2021-07-05 DIAGNOSIS — I10 Essential (primary) hypertension: Secondary | ICD-10-CM | POA: Diagnosis not present

## 2021-07-05 DIAGNOSIS — G2581 Restless legs syndrome: Secondary | ICD-10-CM | POA: Diagnosis not present

## 2021-07-05 DIAGNOSIS — M159 Polyosteoarthritis, unspecified: Secondary | ICD-10-CM

## 2021-07-05 DIAGNOSIS — R269 Unspecified abnormalities of gait and mobility: Secondary | ICD-10-CM | POA: Diagnosis not present

## 2021-07-05 DIAGNOSIS — R634 Abnormal weight loss: Secondary | ICD-10-CM | POA: Diagnosis not present

## 2021-07-05 DIAGNOSIS — R300 Dysuria: Secondary | ICD-10-CM | POA: Diagnosis not present

## 2021-07-05 DIAGNOSIS — M150 Primary generalized (osteo)arthritis: Secondary | ICD-10-CM

## 2021-07-05 MED ORDER — AMITRIPTYLINE HCL 10 MG PO TABS: 5.0000 mg | ORAL_TABLET | Freq: Every day | ORAL | 3 refills | Status: DC

## 2021-07-05 MED ORDER — ACETAMINOPHEN 325 MG PO TABS: 650.0000 mg | ORAL_TABLET | Freq: Every day | ORAL | 0 refills | Status: DC

## 2021-07-05 NOTE — Progress Notes (Signed)
Location:  [facility] Room Number: N1 Place of Service:  SNF (31) Provider:  Windell Moulding, AGNP-C  Mast, Man X, NP  Patient Care Team: Mast, Man X, NP as PCP - General (Internal Medicine) Wardell Honour, MD as Attending Physician (Family Medicine)  Extended Emergency Contact Information Primary Emergency Contact: [facility] Address: [place] [address] [place] Phone: [phone] Mobile Phone: [phone] Relation: Son Secondary Emergency Contact: [facility] Address: [address]          [place], [place] [address] [place] Phone: [phone] Relation: Son  Code Status:  DNR Goals of care: Advanced Directive information Advanced Directives 05/19/2021  Does Patient Have a Medical Advance Directive? Yes  Type of Paramedic of [place];Living will;Out of facility DNR (pink MOST or yellow form)  Does patient want to make changes to medical advance directive? No - Patient declined  Copy of [place] in Chart? Yes - validated most recent copy scanned in chart (See row information)  Would patient like information on creating a medical advance directive? -  Pre-existing out of facility DNR order (yellow form or pink MOST form) Yellow form placed in chart (order not valid for inpatient use);Pink MOST form placed in chart (order not valid for inpatient use)     Chief Complaint  Patient presents with   Acute Visit    dysuria    HPI:  Pt is a 87 y.o. female seen today for acute visit due to ongoing dysuria.   She continues to c/o daily intermittent dysuria. She was started on Uribel 08/31 for 2 weeks, she could not tell if medication was helpful. Last urine culture 06/08 no growth. Failed trial of diflucan 150 mg once and few weeks of macrobid 100 mg daily without success. She is encouraged to drink more water but does not. At this time she is not  interested in seeing urologist due to poor mobility, believes exam would be painful.   HTN- BUN/creat 26/0.8 04/15/2021, amlodipine 2.5 mg daily OA- reports right leg, shoulder and back pain, pain worse in the AM, improves throughout the day, tylenol 650 po prn, pain rated 3/10 today, described as stiff RLS- she is able to sleep at night, requip 1 mg qhs Weight loss/poor appetite- maintaining weight, she dislikes the food served at [employer], remeron 7.5 mg qhs Gait abnormality- uses power wheelchair due to right foot drop and right tibial tendon abnormality, no recent injuries, seen leaving room often  No recent falls or injuries. Ambulates with PWC, eats meals with others and can be seen socializing with other residents daily.   Recent blood pressures:  10/04- 136/66  09/27- 107/71  09/20- 117/80  Recent weights:  10/05- 123.3 lbs  09/07- 123.9 lbs  08/02- 123.3 lbs  Past Medical History:  Diagnosis Date   Anemia    Hypertension    OA (osteoarthritis)    Osteoporosis    Renal cyst    Past Surgical History:  Procedure Laterality Date   APPENDECTOMY     BLADDER SUSPENSION     CATARACT EXTRACTION     CESAREAN SECTION     REPLACEMENT TOTAL KNEE  1995   TONSILLECTOMY      No Known Allergies  Outpatient Encounter Medications as of 07/05/2021  Medication Sig   acetaminophen (TYLENOL) 325 MG tablet Take 650 mg by mouth every 4 (four) hours  as needed.   amLODipine (NORVASC) 2.5 MG tablet Take 2.5 mg by mouth daily.   aspirin EC 81 MG tablet Take 81 mg by mouth. Once A Morning on Mon, Thu   Biotin 5000 MCG TABS Take 5,000 mcg by mouth daily.    bisacodyl (DULCOLAX) 10 MG suppository Place 10 mg rectally every other day.   Cholecalciferol (VITAMIN D) 50 MCG (2000 UT) CAPS Take 2,000 Units by mouth daily.    Cranberry 450 MG TABS Take 1 tablet by mouth daily.   docusate sodium (COLACE) 100 MG capsule Take 100 mg by mouth daily.   KETOCONAZOLE, TOPICAL, 1 % SHAM Apply 1  application topically. Special Instructions: wash hair 2x a week   magnesium hydroxide (MILK OF MAGNESIA) 400 MG/5ML suspension Take 30 mLs by mouth daily as needed for mild constipation.   Meth-Hyo-M Bl-Na Phos-Ph Sal (URIBEL) 118 MG CAPS Take 1 capsule (118 mg total) by mouth in the morning and at bedtime.   metoprolol tartrate (LOPRESSOR) 50 MG tablet TAKE 1 TABLET BY MOUTH TWICE DAILY.   mirtazapine (REMERON) 7.5 MG tablet TAKE ONE TABLET AT BEDTIME.   multivitamin-lutein (OCUVITE-LUTEIN) CAPS capsule Take 1 capsule by mouth 2 (two) times daily.    polyethylene glycol (MIRALAX / GLYCOLAX) 17 g packet Take 17 g by mouth daily as needed.   polyvinyl alcohol (LIQUIFILM TEARS) 1.4 % ophthalmic solution Place 1 drop into both eyes in the morning, at noon, in the evening, and at bedtime.   rOPINIRole (REQUIP) 1 MG tablet TAKE 1 TABLET EACH EVENING.   sennosides-docusate sodium (SENOKOT-S) 8.6-50 MG tablet Take 2 tablets by mouth daily.   zinc oxide 20 % ointment Apply 1 application topically as needed for irritation. Special Instructions: To buttocks after every incontinent episode and as needed for redness. May keep at bedside.   No facility-administered encounter medications on file as of 07/05/2021.    Review of Systems  Constitutional:  Negative for activity change, appetite change, chills, fatigue and fever.  HENT:  Positive for hearing loss. Negative for trouble swallowing.        Hearing aids  Eyes:  Negative for visual disturbance.       Glasses  Respiratory:  Negative for cough, shortness of breath and wheezing.   Cardiovascular:  Positive for leg swelling. Negative for chest pain.  Gastrointestinal:  Negative for abdominal distention, abdominal pain, constipation, diarrhea and nausea.  Genitourinary:  Positive for dysuria. Negative for frequency and hematuria.       Incontinence  Musculoskeletal:  Positive for arthralgias, back pain, gait problem and myalgias.  Skin: Negative.    Neurological:  Positive for weakness and numbness. Negative for dizziness and headaches.  Psychiatric/Behavioral:  Negative for dysphoric mood and sleep disturbance. The patient is not nervous/anxious.    Immunization History  Administered Date(s) Administered   Influenza, High Dose Seasonal PF 07/10/2017, 07/15/2019, 07/05/2020   Influenza,inj,Quad PF,6+ Mos 07/03/2018   Influenza-Unspecified 07/10/2017   Moderna SARS-COV2 Booster Vaccination 03/08/2021   Moderna Sars-Covid-2 Vaccination 10/05/2019, 11/02/2019, 08/15/2020   Pneumococcal Conjugate-13 08/18/2014   Pneumococcal Polysaccharide-23 07/26/2010   Tdap 11/12/2018   Pertinent  Health Maintenance Due  Topic Date Due   INFLUENZA VACCINE  05/01/2021   DEXA SCAN  Discontinued   Fall Risk  08/12/2019 07/23/2019 07/15/2019 04/15/2019 09/02/2018  Falls in the past year? 0 0 0 0 0  Number falls in past yr: 0 0 0 0 0  Injury with Fall? - 0 - 0 0  Functional Status Survey:    Vitals:   07/05/21 1159  BP: 136/66  Pulse: 77  Resp: 20  Temp: (!) 97.2 F (36.2 C)  SpO2: 91%  Weight: 121 lb (54.9 kg)  Height: 5' (1.524 m)   Body mass index is 23.63 kg/m. Physical Exam Vitals reviewed.  Constitutional:      General: She is not in acute distress. HENT:     Head: Normocephalic.  Eyes:     General:        Right eye: No discharge.        Left eye: No discharge.  Cardiovascular:     Rate and Rhythm: Normal rate and regular rhythm.     Pulses: Normal pulses.     Heart sounds: Normal heart sounds. No murmur heard. Pulmonary:     Effort: Pulmonary effort is normal. No respiratory distress.     Breath sounds: Normal breath sounds. No wheezing.  Abdominal:     General: Bowel sounds are normal. There is no distension.     Palpations: Abdomen is soft.     Tenderness: There is no abdominal tenderness.  Musculoskeletal:     Right lower leg: Edema present.     Left lower leg: Edema present.     Comments: Non-pitting, right  foot drop  Skin:    General: Skin is warm and dry.     Capillary Refill: Capillary refill takes less than 2 seconds.  Neurological:     General: No focal deficit present.     Mental Status: She is alert and oriented to person, place, and time.     Motor: Weakness present.     Gait: Gait abnormal.     Comments: Power wheelchair  Psychiatric:        Mood and Affect: Mood normal.        Behavior: Behavior normal.    Labs reviewed: Recent Labs    09/20/20 0000 04/15/21 0000  NA 140 139  K 4.0 3.9  CL 105 104  CO2 29* 29*  BUN 26* 26*  CREATININE 1.1 0.8  CALCIUM 9.2 9.0   Recent Labs    09/20/20 0000 04/15/21 0000  AST 13 11*  ALT 6* 6*  ALKPHOS 61 58  ALBUMIN 3.5 3.1*   Recent Labs    09/20/20 0000 04/15/21 0000  WBC 6.1 8.0  NEUTROABS 2,879.00 4,032.00  HGB 12.2 11.3*  HCT 36 34*  PLT 207 196   Lab Results  Component Value Date   TSH 1.66 09/20/2020   No results found for: HGBA1C Lab Results  Component Value Date   CHOL 175 07/23/2019   HDL 54 07/23/2019   LDLCALC 105 (H) 07/23/2019   TRIG 74 07/23/2019   CHOLHDL 3.2 07/23/2019    Significant Diagnostic Results in last 30 days:  No results found.  Assessment/Plan 1. Dysuria - ongoing, chronic - does not want to see urology - failed trials of Uribel, diflucan, Macrobid - d/c mirtazapine - start amitriptyline 5 mg po qhs for chronic pain - strict measuring of PO intake x 1 week - continue to encourage fluids daily - consider UA in future if symptoms persist- last UA 03/2021 negative  2. Essential hypertension - controlled  - cont metoprolol and norvasc  3. Primary osteoarthritis involving multiple joints - stable, cont scheduled tylenol qhs  4. RLS (restless legs syndrome) - stable with Requip  5. Weight loss - maintained weight last 2 months - d/c mirtazapine  6. Gait abnormality - uses  PWC well - cont skilled nursing care    Family/ staff Communication: plan discussed with  patient, son and nurse  Labs/tests ordered:  none

## 2021-07-06 DIAGNOSIS — H0012 Chalazion right lower eyelid: Secondary | ICD-10-CM | POA: Diagnosis not present

## 2021-07-07 DIAGNOSIS — N390 Urinary tract infection, site not specified: Secondary | ICD-10-CM | POA: Diagnosis not present

## 2021-07-14 DIAGNOSIS — L84 Corns and callosities: Secondary | ICD-10-CM | POA: Diagnosis not present

## 2021-07-14 DIAGNOSIS — B351 Tinea unguium: Secondary | ICD-10-CM | POA: Diagnosis not present

## 2021-07-14 DIAGNOSIS — M79672 Pain in left foot: Secondary | ICD-10-CM | POA: Diagnosis not present

## 2021-07-14 DIAGNOSIS — M79671 Pain in right foot: Secondary | ICD-10-CM | POA: Diagnosis not present

## 2021-07-20 ENCOUNTER — Encounter: Payer: Self-pay | Admitting: Internal Medicine

## 2021-07-20 ENCOUNTER — Non-Acute Institutional Stay (SKILLED_NURSING_FACILITY): Payer: PPO | Admitting: Internal Medicine

## 2021-07-20 DIAGNOSIS — R3915 Urgency of urination: Secondary | ICD-10-CM

## 2021-07-20 DIAGNOSIS — R269 Unspecified abnormalities of gait and mobility: Secondary | ICD-10-CM

## 2021-07-20 DIAGNOSIS — M159 Polyosteoarthritis, unspecified: Secondary | ICD-10-CM | POA: Diagnosis not present

## 2021-07-20 DIAGNOSIS — H0012 Chalazion right lower eyelid: Secondary | ICD-10-CM | POA: Diagnosis not present

## 2021-07-20 DIAGNOSIS — G2581 Restless legs syndrome: Secondary | ICD-10-CM

## 2021-07-20 DIAGNOSIS — I10 Essential (primary) hypertension: Secondary | ICD-10-CM

## 2021-07-20 DIAGNOSIS — R634 Abnormal weight loss: Secondary | ICD-10-CM

## 2021-07-20 NOTE — Progress Notes (Signed)
Location:   [facility] Room Number: 1 Place of Service:  SNF (31) Provider:  Veleta Miners MD   Mast, Man X, NP  Patient Care Team: Mast, Man X, NP as PCP - General (Internal Medicine) Wardell Honour, MD as Attending Physician (Family Medicine)  Extended Emergency Contact Information Primary Emergency Contact: [facility] Address: [place] [address] [place] Phone: [phone] Mobile Phone: [phone] Relation: Son Secondary Emergency Contact: [facility] Address: [address]          [place], [place] [address] [place] Phone: [phone] Relation: Son  Code Status:  DNRPalliative CareManaged Care  Goals of care: Advanced Directive information Advanced Directives 07/20/2021  Does Patient Have a Medical Advance Directive? Yes  Type of Paramedic of [place];Living will;Out of facility DNR (pink MOST or yellow form)  Does patient want to make changes to medical advance directive? No - Patient declined  Copy of [place] in Chart? Yes - validated most recent copy scanned in chart (See row information)  Would patient like information on creating a medical advance directive? -  Pre-existing out of facility DNR order (yellow form or pink MOST form) Pink MOST form placed in chart (order not valid for inpatient use);Yellow form placed in chart (order not valid for inpatient use)     Chief Complaint  Patient presents with   Medical Management of Chronic Issues   Quality Metric Gaps    Flu shot    HPI:  Pt is a 87 y.o. female seen today for medical management of chronic diseases.    Patient has h/o Hypertension, Anxiety, Restless leg Syndrome, Insomnia, Arthritis and Posterior Tendon Dysfunction with right foot Valgus in Right Knee with inability to Ambulate   Doing well. Her 2 active issues are Urinary Issues Patient states that at  night it it hurts when she is trying to Urinate  She feels like her bladder is full but it takes forever for her to empty her bladder Her Urine Culture is negative Failed Macrobid, Flomax Does not want to bother weith Uroilogy Dr Sabra Heck her POA is talking to them and had decided on Elavil to see if ti wll help  She says not helped yet  Weight loss and Poor Appetite Patietn says she does not like the food here Does not like supplements Off remeron also Her weight has been stable recently  No other issues Uses Power Wheelchair Enrolled in Palliative Past Medical History:  Diagnosis Date   Anemia    Hypertension    OA (osteoarthritis)    Osteoporosis    Renal cyst    Past Surgical History:  Procedure Laterality Date   APPENDECTOMY     BLADDER SUSPENSION     CATARACT EXTRACTION     CESAREAN SECTION     REPLACEMENT TOTAL KNEE  1995   TONSILLECTOMY      No Known Allergies  Allergies as of 07/20/2021   No Known Allergies      Medication List        Accurate as of July 20, 2021 11:59 PM. If you have any questions, ask your nurse or doctor.          STOP taking these medications    KETOCONAZOLE (TOPICAL) 1 % Sham Stopped by: Virgie Dad, MD   Uribel 118 MG Caps Stopped by: Virgie Dad, MD  TAKE these medications    acetaminophen 325 MG tablet Commonly known as: TYLENOL Take 650 mg by mouth every 4 (four) hours as needed.   acetaminophen 325 MG tablet Commonly known as: Tylenol Take 2 tablets (650 mg total) by mouth at bedtime.   amitriptyline 10 MG tablet Commonly known as: ELAVIL Take 0.5 tablets (5 mg total) by mouth at bedtime.   amLODipine 5 MG tablet Commonly known as: NORVASC Take 2.5 mg by mouth daily.   aspirin EC 81 MG tablet Take 81 mg by mouth. Once A Morning on Mon, Thu   Biotin 5000 MCG Tabs Take 5,000 mcg by mouth daily.   bisacodyl 10 MG suppository Commonly known as: DULCOLAX Place 10 mg rectally every other  day.   Cranberry 450 MG Tabs Take 1 tablet by mouth daily.   docusate sodium 100 MG capsule Commonly known as: COLACE Take 100 mg by mouth daily.   magnesium hydroxide 400 MG/5ML suspension Commonly known as: MILK OF MAGNESIA Take 30 mLs by mouth daily as needed for mild constipation.   metoprolol tartrate 50 MG tablet Commonly known as: LOPRESSOR TAKE 1 TABLET BY MOUTH TWICE DAILY.   multivitamin-lutein Caps capsule Take 1 capsule by mouth 2 (two) times daily.   polyethylene glycol 17 g packet Commonly known as: MIRALAX / GLYCOLAX Take 17 g by mouth daily as needed.   polyvinyl alcohol 1.4 % ophthalmic solution Commonly known as: LIQUIFILM TEARS Place 1 drop into both eyes in the morning, at noon, in the evening, and at bedtime.   rOPINIRole 1 MG tablet Commonly known as: REQUIP TAKE 1 TABLET EACH EVENING.   sennosides-docusate sodium 8.6-50 MG tablet Commonly known as: SENOKOT-S Take 2 tablets by mouth daily.   Vitamin D 50 MCG (2000 UT) Caps Take 2,000 Units by mouth daily.   zinc oxide 20 % ointment Apply 1 application topically as needed for irritation. Special Instructions: To buttocks after every incontinent episode and as needed for redness. May keep at bedside.        Review of Systems  Constitutional: Negative.   HENT: Negative.    Respiratory: Negative.    Cardiovascular:  Positive for leg swelling.  Gastrointestinal:  Positive for constipation.  Genitourinary:  Positive for urgency.  Musculoskeletal:  Positive for gait problem.  Skin: Negative.   Neurological:  Negative for dizziness.  Psychiatric/Behavioral: Negative.     Immunization History  Administered Date(s) Administered   Influenza, High Dose Seasonal PF 07/10/2017, 07/15/2019, 07/05/2020   Influenza,inj,Quad PF,6+ Mos 07/03/2018   Influenza-Unspecified 07/10/2017   Moderna SARS-COV2 Booster Vaccination 03/08/2021   Moderna Sars-Covid-2 Vaccination 10/05/2019, 11/02/2019, 08/15/2020    PFIZER(Purple Top)SARS-COV-2 Vaccination 06/21/2021   Pneumococcal Conjugate-13 08/18/2014   Pneumococcal Polysaccharide-23 07/26/2010   Tdap 11/12/2018   Pertinent  Health Maintenance Due  Topic Date Due   INFLUENZA VACCINE  05/01/2021   DEXA SCAN  Discontinued   Fall Risk  08/12/2019 07/23/2019 07/15/2019 04/15/2019 09/02/2018  Falls in the past year? 0 0 0 0 0  Number falls in past yr: 0 0 0 0 0  Injury with Fall? - 0 - 0 0   Functional Status Survey:    Vitals:   07/20/21 1018  BP: 119/75  Pulse: 64  Resp: 20  Temp: (!) 97.1 F (36.2 C)  SpO2: 92%  Weight: 123 lb (55.8 kg)  Height: 5' (1.524 m)   Body mass index is 24.02 kg/m. Physical Exam Constitutional: Oriented to person, place, and time. Well-developed and well-nourished.  HENT:  Head: Normocephalic.  Mouth/Throat: Oropharynx is clear and moist.  Eyes: Pupils are equal, round, and reactive to light.  Neck: Neck supple.  Cardiovascular: Normal rate and normal heart sounds.  No murmur heard. Pulmonary/Chest: Effort normal and breath sounds normal. No respiratory distress. No wheezes. She has no rales.  Abdominal: Soft. Bowel sounds are normal. No distension. There is no tenderness. There is no rebound.  Musculoskeletal:  Mild swelling Bilateral Right more then Left  With Chronic Changes Lymphadenopathy: none Neurological: Alert and oriented to person, place, and time.  Skin: Skin is warm and dry.  Psychiatric: Normal mood and affect. Behavior is normal. Thought content normal.   Labs reviewed: Recent Labs    09/20/20 0000 04/15/21 0000  NA 140 139  K 4.0 3.9  CL 105 104  CO2 29* 29*  BUN 26* 26*  CREATININE 1.1 0.8  CALCIUM 9.2 9.0   Recent Labs    09/20/20 0000 04/15/21 0000  AST 13 11*  ALT 6* 6*  ALKPHOS 61 58  ALBUMIN 3.5 3.1*   Recent Labs    09/20/20 0000 04/15/21 0000  WBC 6.1 8.0  NEUTROABS 2,879.00 4,032.00  HGB 12.2 11.3*  HCT 36 34*  PLT 207 196   Lab Results   Component Value Date   TSH 1.66 09/20/2020   No results found for: HGBA1C Lab Results  Component Value Date   CHOL 175 07/23/2019   HDL 54 07/23/2019   LDLCALC 105 (H) 07/23/2019   TRIG 74 07/23/2019   CHOLHDL 3.2 07/23/2019    Significant Diagnostic Results in last 30 days:  No results found.  Assessment/Plan Essential hypertension BP stable on Norvasc and Lopressor Primary osteoarthritis involving multiple joints Tylenol PRN RLS (restless legs syndrome) On Requip Weight loss Has stabilized Not on Remeron anymore  Gait abnormality Uses Power Chair Urinary urgency On Elavil right now   Family/ staff Communication:   Labs/tests ordered:

## 2021-08-13 ENCOUNTER — Encounter: Payer: Self-pay | Admitting: Internal Medicine

## 2021-08-14 ENCOUNTER — Encounter: Payer: Self-pay | Admitting: Orthopedic Surgery

## 2021-08-14 ENCOUNTER — Non-Acute Institutional Stay (SKILLED_NURSING_FACILITY): Payer: PPO | Admitting: Orthopedic Surgery

## 2021-08-14 DIAGNOSIS — G2581 Restless legs syndrome: Secondary | ICD-10-CM

## 2021-08-14 DIAGNOSIS — K5901 Slow transit constipation: Secondary | ICD-10-CM | POA: Diagnosis not present

## 2021-08-14 DIAGNOSIS — I10 Essential (primary) hypertension: Secondary | ICD-10-CM | POA: Diagnosis not present

## 2021-08-14 DIAGNOSIS — M159 Polyosteoarthritis, unspecified: Secondary | ICD-10-CM

## 2021-08-14 DIAGNOSIS — R634 Abnormal weight loss: Secondary | ICD-10-CM | POA: Diagnosis not present

## 2021-08-14 DIAGNOSIS — R300 Dysuria: Secondary | ICD-10-CM

## 2021-08-14 DIAGNOSIS — R269 Unspecified abnormalities of gait and mobility: Secondary | ICD-10-CM | POA: Diagnosis not present

## 2021-08-14 DIAGNOSIS — R339 Retention of urine, unspecified: Secondary | ICD-10-CM

## 2021-08-14 DIAGNOSIS — M150 Primary generalized (osteo)arthritis: Secondary | ICD-10-CM

## 2021-08-14 NOTE — Progress Notes (Signed)
Location:   [facility] Room Number: 1 Place of Service:  SNF (31) Provider:  Windell Moulding, NP    Patient Care Team: Mast, Man X, NP as PCP - General (Internal Medicine) Wardell Honour, MD as Attending Physician (Family Medicine)  Extended Emergency Contact Information Primary Emergency Contact: [address] [facility] Address: [place] [address] [place] Phone: [phone] Mobile Phone: [phone] Relation: Son Secondary Emergency Contact: [facility] Address: [place]          [place], [place] [address] [place] Phone: [phone] Relation: Son  Code Status:  DNR Goals of care: Advanced Directive information Advanced Directives 08/14/2021  Does Patient Have a Medical Advance Directive? Yes  Type of Paramedic of [place];Living will;Out of facility DNR (pink MOST or yellow form)  Does patient want to make changes to medical advance directive? No - Patient declined  Copy of [place] in Chart? Yes - validated most recent copy scanned in chart (See row information)  Would patient like information on creating a medical advance directive? -  Pre-existing out of facility DNR order (yellow form or pink MOST form) -     Chief Complaint  Patient presents with   Medical Management of Chronic Issues    Routine Visit.     HPI:  Pt is a 87 y.o. female seen today for medical management of chronic diseases.    She continues to reside on the skilled nursing unit at [facility]. Past medical history includes: hypertension, PVD, osteoarthritis of multiple joints, CKD stage 3, posterior tendon dysfunction with right foot drop, constipation, restless leg syndrome and anxiety.   Urinary retention/dysuria- ongoing, unsuccessful trial of Uribel 06/2021,recently started on amitriptyline,last urine culture inconclusive- recollection recommended, recently she  has been having trouble urinating in the evening, remains on cranberry supplement for UTI prevention, encouraged to drink water to lessen symptoms- does not drink water well HTN- BUN/creat 26/0.8 04/15/2021, amlodipine 2.5 mg daily OA- reports right leg, shoulder and back pain, pain worse in the AM, improves throughout the day, tylenol 650 po prn RLS- she is able to sleep at night, requip qhs Weight loss/poor appetite- losing about 1 lbs per month, she dislikes the food served at [employer], followed by dietary Gait abnormality- uses power wheelchair due to right foot drop and right tibial tendon abnormality, no recent injuries, seen leaving room often Constipation- LBM 11/14, remains on colace, senna and miralax prn  No recent falls, injuries or behavioral outbursts.   Recent blood pressures:  11/08- 111/87  10/25- 119/75  10/18- 148/92  Recent weights:  11/09- 121.3 lbs  10/05- 123.3 lbs  09/07- 123.9 lbs          Past Medical History:  Diagnosis Date   Anemia    Hypertension    OA (osteoarthritis)    Osteoporosis    Renal cyst    Past Surgical History:  Procedure Laterality Date   APPENDECTOMY     BLADDER SUSPENSION     CATARACT EXTRACTION     CESAREAN SECTION     REPLACEMENT TOTAL KNEE  1995   TONSILLECTOMY      No Known Allergies  Allergies as of 08/14/2021   No Known Allergies      Medication List        Accurate as of August 14, 2021  9:34 AM. If you have any questions,  ask your nurse or doctor.          acetaminophen 325 MG tablet Commonly known as: TYLENOL Take 650 mg by mouth every 4 (four) hours as needed.   acetaminophen 325 MG tablet Commonly known as: Tylenol Take 2 tablets (650 mg total) by mouth at bedtime.   amitriptyline 10 MG tablet Commonly known as: ELAVIL Take 0.5 tablets (5 mg total) by mouth at bedtime.   amLODipine 5 MG tablet Commonly known as: NORVASC Take 2.5 mg by mouth daily.   aspirin EC 81 MG tablet Take 81  mg by mouth. Once A Morning on Mon, Thu   Biotin 5000 MCG Tabs Take 5,000 mcg by mouth daily.   bisacodyl 10 MG suppository Commonly known as: DULCOLAX Place 10 mg rectally every other day.   Cranberry 450 MG Tabs Take 1 tablet by mouth daily.   docusate sodium 100 MG capsule Commonly known as: COLACE Take 100 mg by mouth daily.   magnesium hydroxide 400 MG/5ML suspension Commonly known as: MILK OF MAGNESIA Take 30 mLs by mouth daily as needed for mild constipation.   metoprolol tartrate 50 MG tablet Commonly known as: LOPRESSOR TAKE 1 TABLET BY MOUTH TWICE DAILY.   multivitamin-lutein Caps capsule Take 1 capsule by mouth 2 (two) times daily.   polyethylene glycol 17 g packet Commonly known as: MIRALAX / GLYCOLAX Take 17 g by mouth daily as needed.   polyvinyl alcohol 1.4 % ophthalmic solution Commonly known as: LIQUIFILM TEARS Place 1 drop into both eyes in the morning, at noon, in the evening, and at bedtime.   rOPINIRole 1 MG tablet Commonly known as: REQUIP TAKE 1 TABLET EACH EVENING.   sennosides-docusate sodium 8.6-50 MG tablet Commonly known as: SENOKOT-S Take 2 tablets by mouth daily.   Vitamin D 50 MCG (2000 UT) Caps Take 2,000 Units by mouth daily.   zinc oxide 20 % ointment Apply 1 application topically as needed for irritation. Special Instructions: To buttocks after every incontinent episode and as needed for redness. May keep at bedside.        Review of Systems  Constitutional:  Negative for activity change, appetite change, chills, fatigue and fever.  HENT:  Positive for hearing loss. Negative for dental problem and trouble swallowing.   Eyes:  Positive for visual disturbance.       Glasses, trouble reading at long distances  Respiratory:  Negative for cough, shortness of breath and wheezing.   Cardiovascular:  Negative for chest pain and leg swelling.  Gastrointestinal:  Positive for constipation. Negative for abdominal distention,  abdominal pain, blood in stool, diarrhea, nausea and vomiting.  Genitourinary:  Positive for dysuria and frequency. Negative for hematuria.       Urinary retention in evening  Musculoskeletal:  Positive for arthralgias, back pain, gait problem and myalgias.       Right foot drop  Skin:        Scattered bruising  Neurological:  Positive for weakness. Negative for dizziness and headaches.  Psychiatric/Behavioral:  Negative for confusion, dysphoric mood and sleep disturbance. The patient is not nervous/anxious.    Immunization History  Administered Date(s) Administered   Influenza, High Dose Seasonal PF 07/10/2017, 07/15/2019, 07/05/2020, 07/25/2021   Influenza,inj,Quad PF,6+ Mos 07/03/2018   Influenza-Unspecified 07/10/2017   Moderna SARS-COV2 Booster Vaccination 03/08/2021   Moderna Sars-Covid-2 Vaccination 10/05/2019, 11/02/2019, 08/15/2020, 06/21/2021   PFIZER(Purple Top)SARS-COV-2 Vaccination 06/21/2021   Pneumococcal Conjugate-13 08/18/2014   Pneumococcal Polysaccharide-23 07/26/2010   Tdap 11/12/2018   Pertinent  Health Maintenance Due  Topic Date Due   INFLUENZA VACCINE  Completed   DEXA SCAN  Discontinued   Fall Risk 11/12/2018 04/15/2019 07/15/2019 07/23/2019 08/12/2019  Falls in the past year? - 0 0 0 0  Was there an injury with Fall? - 0 - 0 -  Fall Risk Category Calculator - 0 - 0 -  Fall Risk Category - Low - Low -  Patient Fall Risk Level Moderate fall risk Low fall risk - - -   Functional Status Survey:    Vitals:   08/14/21 0925  BP: 111/87  Pulse: 77  Resp: (!) 22  Temp: (!) 97.1 F (36.2 C)  SpO2: 98%  Weight: 121 lb 4.8 oz (55 kg)  Height: 5' (1.524 m)   Body mass index is 23.69 kg/m. Physical Exam Vitals reviewed.  Constitutional:      General: She is not in acute distress. HENT:     Head: Normocephalic.     Right Ear: There is no impacted cerumen.     Left Ear: There is no impacted cerumen.     Nose: Nose normal.     Mouth/Throat:      Mouth: Mucous membranes are moist.  Eyes:     General:        Right eye: No discharge.        Left eye: No discharge.  Neck:     Vascular: No carotid bruit.  Cardiovascular:     Rate and Rhythm: Normal rate and regular rhythm.     Pulses: Normal pulses.     Heart sounds: Normal heart sounds. No murmur heard. Pulmonary:     Effort: Pulmonary effort is normal. No respiratory distress.     Breath sounds: Normal breath sounds. No wheezing.  Abdominal:     General: Bowel sounds are normal. There is no distension.     Palpations: Abdomen is soft.     Tenderness: There is no abdominal tenderness.  Musculoskeletal:     Cervical back: Normal range of motion.     Comments: Right foot drop, tenderness when touched, dorsal pedis +1  Lymphadenopathy:     Cervical: No cervical adenopathy.  Skin:    General: Skin is warm and dry.     Capillary Refill: Capillary refill takes less than 2 seconds.     Findings: Bruising present.     Comments: Scattered bruising to upper extremities, no skin breakdown  Neurological:     General: No focal deficit present.     Mental Status: She is alert and oriented to person, place, and time.     Motor: Weakness present.     Gait: Gait abnormal.     Comments: Power wheelchair  Psychiatric:        Mood and Affect: Mood normal.        Behavior: Behavior normal.    Labs reviewed: Recent Labs    09/20/20 0000 04/15/21 0000  NA 140 139  K 4.0 3.9  CL 105 104  CO2 29* 29*  BUN 26* 26*  CREATININE 1.1 0.8  CALCIUM 9.2 9.0   Recent Labs    09/20/20 0000 04/15/21 0000  AST 13 11*  ALT 6* 6*  ALKPHOS 61 58  ALBUMIN 3.5 3.1*   Recent Labs    09/20/20 0000 04/15/21 0000  WBC 6.1 8.0  NEUTROABS 2,879.00 4,032.00  HGB 12.2 11.3*  HCT 36 34*  PLT 207 196   Lab Results  Component Value Date   TSH 1.66  09/20/2020   No results found for: HGBA1C Lab Results  Component Value Date   CHOL 175 07/23/2019   HDL 54 07/23/2019   LDLCALC 105 (H)  07/23/2019   TRIG 74 07/23/2019   CHOLHDL 3.2 07/23/2019    Significant Diagnostic Results in last 30 days:  No results found.  Assessment/Plan 1. Urinary retention - reports retention in evening - was on flomax a few months ago- unsure about effectiveness and discontinued - start Flomax 0.4 mg po every evening x 14 days - encourage hydration with water  2. Dysuria - ongoing, suspect cystitis - UA 07/07/2021- occult blood 3+, protein 2+, leukocytes 3+ - Urine culture 07/07/2021- recommend recollection- not done due to improved symptoms - start pyridium 200 mg po bid x 2 days - discussed hydrating with water to help burning sensation - started on amitriptyline last month- recently increased to 10 mg  - continue cranberry supplement for UTI prevention  3. Essential hypertension - controlled - cont amlodipine and metoprolol  4. Primary osteoarthritis involving multiple joints - ongoing - cont scheduled tylenol  5. RLS (restless legs syndrome) - stable with Requip  6. Weight loss - lost 2 lbs in past month - cont monthly weights - Remeron recently discontinued  7. Gait abnormality - uses [facility]WC well- no recent issues/accidents - cont skilled nursing care  8. Slow transit constipation - LBM 11/14 - abdomen soft - cont colace, senna and miralax prn    Family/ staff Communication: plan discussed with patient and nurse  Labs/tests ordered: none

## 2021-09-08 ENCOUNTER — Non-Acute Institutional Stay (SKILLED_NURSING_FACILITY): Payer: PPO | Admitting: Orthopedic Surgery

## 2021-09-08 ENCOUNTER — Encounter: Payer: Self-pay | Admitting: Orthopedic Surgery

## 2021-09-08 DIAGNOSIS — I10 Essential (primary) hypertension: Secondary | ICD-10-CM

## 2021-09-08 DIAGNOSIS — R269 Unspecified abnormalities of gait and mobility: Secondary | ICD-10-CM

## 2021-09-08 DIAGNOSIS — M159 Polyosteoarthritis, unspecified: Secondary | ICD-10-CM | POA: Diagnosis not present

## 2021-09-08 DIAGNOSIS — R300 Dysuria: Secondary | ICD-10-CM | POA: Diagnosis not present

## 2021-09-08 DIAGNOSIS — K5901 Slow transit constipation: Secondary | ICD-10-CM

## 2021-09-08 DIAGNOSIS — R634 Abnormal weight loss: Secondary | ICD-10-CM | POA: Diagnosis not present

## 2021-09-08 DIAGNOSIS — R251 Tremor, unspecified: Secondary | ICD-10-CM

## 2021-09-08 DIAGNOSIS — H04123 Dry eye syndrome of bilateral lacrimal glands: Secondary | ICD-10-CM | POA: Diagnosis not present

## 2021-09-08 DIAGNOSIS — G2581 Restless legs syndrome: Secondary | ICD-10-CM | POA: Diagnosis not present

## 2021-09-08 NOTE — Progress Notes (Signed)
Location:   [facility] Room Number: 1 A Place of Service:  SNF (31) Provider: Windell Moulding, NP   Mast, Man X, NP  Patient Care Team: Mast, Man X, NP as PCP - General (Internal Medicine) Wardell Honour, MD as Attending Physician (Family Medicine)  Extended Emergency Contact Information Primary Emergency Contact: [facility] Address: [place] [address] [place] Phone: [phone] Mobile Phone: [phone] Relation: Son Secondary Emergency Contact: [facility] Address: [address]          [place], [place] [address] [place] Phone: [phone] Relation: Son  Code Status:  DNR Goals of care: Advanced Directive information Advanced Directives 09/08/2021  Does Patient Have a Medical Advance Directive? Yes  Type of Paramedic of [place];Living will;Out of facility DNR (pink MOST or yellow form)  Does patient want to make changes to medical advance directive? No - Patient declined  Copy of [place] in Chart? Yes - validated most recent copy scanned in chart (See row information)  Would patient like information on creating a medical advance directive? -  Pre-existing out of facility DNR order (yellow form or pink MOST form) Pink MOST form placed in chart (order not valid for inpatient use);Yellow form placed in chart (order not valid for inpatient use)     Chief Complaint  Patient presents with   Medical Management of Chronic Issues    Routine Visit   Quality Metric Gaps    COVID #5    HPI:  Pt is a 87 y.o. female seen today for medical management of chronic diseases.    She continues to reside on the skilled nursing unit at [facility]. Past medical history includes: hypertension, PVD, osteoarthritis of multiple joints, CKD stage 3, posterior tendon dysfunction with right foot drop, constipation, restless leg syndrome and anxiety.    Dysuria- ongoing, unsuccessful trial of Uribel 06/2021,recently started on amitriptyline,last urine culture inconclusive- recollection recommended, started on a trial of Estrace and believes it helped- requesting medication be extended, remains on cranberry supplement HTN- BUN/creat 26/0.8 04/15/2021, remains on amlodipine and metoprolol OA- reports right leg/shoulder/back pain, pain worse in the AM, improves throughout the day, tylenol 650 po prn RLS- she is able to sleep at night, requip qhs Weight loss/poor appetite- losing about 1 lbs per month, she dislikes the food served at [employer], followed by dietary Gait abnormality- uses power wheelchair due to right foot drop and right tibial tendon abnormality Constipation- LBM 12/09, remains on colace, senna and miralax prn Dry eyes- reports increased dry eyes, uses artificial tears, requesting to use more  No recent falls, injuries or behavioral outbursts. Very social with others and seen out of room often.   Recent blood pressures:  12/06- 115/77  11/29- 130/85  11/22- 128/90  Recent weights:  12/07- 121 lbs  11/09- 121.3 lbs  10/05- 123.3 lbs   Past Medical History:  Diagnosis Date   Anemia    Hypertension    OA (osteoarthritis)    Osteoporosis    Renal cyst    Past Surgical History:  Procedure Laterality Date   APPENDECTOMY     BLADDER SUSPENSION     CATARACT EXTRACTION     CESAREAN SECTION     REPLACEMENT TOTAL KNEE  1995   TONSILLECTOMY      No Known Allergies  Allergies as of 09/08/2021   No Known  Allergies      Medication List        Accurate as of September 08, 2021 11:57 AM. If you have any questions, ask your nurse or doctor.          acetaminophen 325 MG tablet Commonly known as: TYLENOL Take 650 mg by mouth every 4 (four) hours as needed.   acetaminophen 325 MG tablet Commonly known as: Tylenol Take 2 tablets (650 mg total) by mouth at bedtime.   amitriptyline 10 MG tablet Commonly known  as: ELAVIL Take 0.5 tablets (5 mg total) by mouth at bedtime. What changed: how much to take   amLODipine 5 MG tablet Commonly known as: NORVASC Take 2.5 mg by mouth daily.   aspirin EC 81 MG tablet Take 81 mg by mouth. Once A Morning on Mon, Thu   Biotin 5000 MCG Tabs Take 5,000 mcg by mouth daily.   bisacodyl 10 MG suppository Commonly known as: DULCOLAX Place 10 mg rectally every other day.   Cranberry 450 MG Tabs Take 1 tablet by mouth daily.   docusate sodium 100 MG capsule Commonly known as: COLACE Take 100 mg by mouth daily.   magnesium hydroxide 400 MG/5ML suspension Commonly known as: MILK OF MAGNESIA Take 30 mLs by mouth daily as needed for mild constipation.   metoprolol tartrate 50 MG tablet Commonly known as: LOPRESSOR TAKE 1 TABLET BY MOUTH TWICE DAILY.   multivitamin-lutein Caps capsule Take 1 capsule by mouth 2 (two) times daily.   polyethylene glycol 17 g packet Commonly known as: MIRALAX / GLYCOLAX Take 17 g by mouth daily as needed.   polyvinyl alcohol 1.4 % ophthalmic solution Commonly known as: LIQUIFILM TEARS Place 1 drop into both eyes in the morning, at noon, in the evening, and at bedtime.   rOPINIRole 1 MG tablet Commonly known as: REQUIP TAKE 1 TABLET EACH EVENING.   sennosides-docusate sodium 8.6-50 MG tablet Commonly known as: SENOKOT-S Take 2 tablets by mouth daily.   Vitamin D 50 MCG (2000 UT) Caps Take 2,000 Units by mouth daily.   zinc oxide 20 % ointment Apply 1 application topically as needed for irritation. Special Instructions: To buttocks after every incontinent episode and as needed for redness. May keep at bedside.        Review of Systems  Constitutional:  Negative for activity change, appetite change, chills, fatigue and fever.  HENT:  Positive for hearing loss. Negative for trouble swallowing.   Eyes:  Negative for pain, discharge, redness and itching.  Respiratory:  Negative for cough, shortness of breath  and wheezing.   Cardiovascular:  Positive for leg swelling. Negative for chest pain.  Gastrointestinal:  Positive for constipation. Negative for abdominal distention, blood in stool, diarrhea, nausea and vomiting.  Genitourinary:  Positive for dysuria. Negative for frequency and hematuria.  Musculoskeletal:  Positive for arthralgias, back pain and gait problem.  Skin:  Negative for rash and wound.  Neurological:  Positive for tremors, weakness and numbness. Negative for dizziness and headaches.  Psychiatric/Behavioral:  Positive for dysphoric mood. Negative for confusion and sleep disturbance. The patient is not nervous/anxious.    Immunization History  Administered Date(s) Administered   Influenza, High Dose Seasonal PF 07/10/2017, 07/15/2019, 07/05/2020, 07/25/2021   Influenza,inj,Quad PF,6+ Mos 07/03/2018   Influenza-Unspecified 07/04/2015, 07/10/2017   Moderna SARS-COV2 Booster Vaccination 03/08/2021   Moderna Sars-Covid-2 Vaccination 10/05/2019, 11/02/2019, 08/15/2020, 06/21/2021   PFIZER(Purple Top)SARS-COV-2 Vaccination 06/21/2021   Pneumococcal Conjugate-13 08/18/2014   Pneumococcal Polysaccharide-23 07/26/2010   Tdap  11/12/2018   Pertinent  Health Maintenance Due  Topic Date Due   INFLUENZA VACCINE  Completed   DEXA SCAN  Discontinued   Fall Risk 11/12/2018 04/15/2019 07/15/2019 07/23/2019 08/12/2019  Falls in the past year? - 0 0 0 0  Was there an injury with Fall? - 0 - 0 -  Fall Risk Category Calculator - 0 - 0 -  Fall Risk Category - Low - Low -  Patient Fall Risk Level Moderate fall risk Low fall risk - - -   Functional Status Survey:    Vitals:   09/08/21 1137  BP: 115/77  Pulse: 76  Resp: 18  Temp: (!) 97.5 F (36.4 C)  SpO2: 91%  Weight: 121 lb (54.9 kg)  Height: 5' (1.524 m)   Body mass index is 23.63 kg/m. Physical Exam Vitals reviewed.  Constitutional:      General: She is not in acute distress. HENT:     Head: Normocephalic.     Right Ear:  There is no impacted cerumen.     Left Ear: There is no impacted cerumen.     Ears:     Comments: Bilateral hearing aids    Nose: Nose normal.     Mouth/Throat:     Mouth: Mucous membranes are moist.  Eyes:     General:        Right eye: No discharge.        Left eye: No discharge.     Extraocular Movements: Extraocular movements intact.     Conjunctiva/sclera: Conjunctivae normal.     Pupils: Pupils are equal, round, and reactive to light.  Neck:     Vascular: No carotid bruit.  Cardiovascular:     Rate and Rhythm: Normal rate and regular rhythm.     Pulses: Normal pulses.     Heart sounds: Normal heart sounds. No murmur heard. Pulmonary:     Effort: Pulmonary effort is normal. No respiratory distress.     Breath sounds: Normal breath sounds. No wheezing.  Abdominal:     General: Bowel sounds are normal. There is no distension.     Palpations: Abdomen is soft.     Tenderness: There is no abdominal tenderness.  Musculoskeletal:     Cervical back: Normal range of motion.     Right lower leg: No edema.     Left lower leg: No edema.  Lymphadenopathy:     Cervical: No cervical adenopathy.  Skin:    General: Skin is warm and dry.     Capillary Refill: Capillary refill takes less than 2 seconds.  Neurological:     General: No focal deficit present.     Mental Status: She is alert and oriented to person, place, and time.     Motor: Weakness present.     Gait: Gait abnormal.     Comments: PWC  Psychiatric:        Mood and Affect: Mood normal.        Behavior: Behavior normal.    Labs reviewed: Recent Labs    09/20/20 0000 04/15/21 0000  NA 140 139  K 4.0 3.9  CL 105 104  CO2 29* 29*  BUN 26* 26*  CREATININE 1.1 0.8  CALCIUM 9.2 9.0   Recent Labs    09/20/20 0000 04/15/21 0000  AST 13 11*  ALT 6* 6*  ALKPHOS 61 58  ALBUMIN 3.5 3.1*   Recent Labs    09/20/20 0000 04/15/21 0000  WBC 6.1 8.0  NEUTROABS  2,879.00 4,032.00  HGB 12.2 11.3*  HCT 36 34*  PLT  207 196   Lab Results  Component Value Date   TSH 1.66 09/20/2020   No results found for: HGBA1C Lab Results  Component Value Date   CHOL 175 07/23/2019   HDL 54 07/23/2019   LDLCALC 105 (H) 07/23/2019   TRIG 74 07/23/2019   CHOLHDL 3.2 07/23/2019    Significant Diagnostic Results in last 30 days:  No results found.  Assessment/Plan 1. Dysuria - ongoing - past urine cultures negative - unsuccessful trial to Uribel - recently started amitriptyline - some improvement with estradiol trial - start estradiol 0.01%- apply to vulvovaginal area daily x 2 weeks , then 3 times weekly x 2 weeks, then twice weekly prn   2. Essential hypertension - controlled - cont amlodipine and metoprolol  3. Primary osteoarthritis involving multiple joints - stable with tylenol  4. RLS (restless legs syndrome) - cont Requip  5. Weight loss - weight unchanged  6. Gait abnormality - ambulates well with power wheelchair - cont skilled nursing care  7. Slow transit constipation - LBM 12/09, abdomen soft - cont colace senna and miralax prn  8. Dry eyes, bilateral - exam unremarkable - will increase artificial tears to tid  9. Tremor of both hands - able to perform ADLs    Family/ staff Communication: plan discussed with patient and nurse  Labs/tests ordered:  none

## 2021-09-29 ENCOUNTER — Non-Acute Institutional Stay (INDEPENDENT_AMBULATORY_CARE_PROVIDER_SITE_OTHER): Payer: PPO | Admitting: Orthopedic Surgery

## 2021-09-29 ENCOUNTER — Encounter: Payer: Self-pay | Admitting: Orthopedic Surgery

## 2021-09-29 DIAGNOSIS — Z0000 Encounter for general adult medical examination without abnormal findings: Secondary | ICD-10-CM

## 2021-09-29 NOTE — Patient Instructions (Signed)
°  Ms. Esguerra , Thank you for taking time to come for your Medicare Wellness Visit. I appreciate your ongoing commitment to your health goals. Please review the following plan we discussed and let me know if I can assist you in the future.   These are the goals we discussed:  Goals      DIET - INCREASE WATER INTAKE        This is a list of the screening recommended for you and due dates:  Health Maintenance  Topic Date Due   COVID-19 Vaccine (5 - Booster for Moderna series) 08/16/2021   Zoster (Shingles) Vaccine (1 of 2) 10/05/2021*   Tetanus Vaccine  11/12/2028   Pneumonia Vaccine  Completed   Flu Shot  Completed   HPV Vaccine  Aged Out   DEXA scan (bone density measurement)  Discontinued  *Topic was postponed. The date shown is not the original due date.

## 2021-09-29 NOTE — Progress Notes (Signed)
Subjective:   Ann Jensen is a 87 y.o. female who presents for Medicare Annual (Subsequent) preventive examination.  Review of Systems     Cardiac Risk Factors include: advanced age (>97men, >52 women);sedentary lifestyle;hypertension;dyslipidemia     Objective:    Today's Vitals   09/29/21 1329  BP: 121/85  Pulse: 64  Resp: 20  Temp: (!) 97 F (36.1 C)  SpO2: 94%  Weight: 124 lb 6.4 oz (56.4 kg)  Height: 5' (1.524 m)   Body mass index is 24.3 kg/m.  Advanced Directives 09/29/2021 09/08/2021 08/14/2021 07/20/2021 05/19/2021 04/27/2021 03/13/2021  Does Patient Have a Medical Advance Directive? Yes Yes Yes Yes Yes Yes Yes  Type of Paramedic of [place] at [place];Living will;Out of facility DNR (pink MOST or yellow form) [place];Living will;Out of facility DNR (pink MOST or yellow form) [place];Living will;Out of facility DNR (pink MOST or yellow form) [place];Living will;Out of facility DNR (pink MOST or yellow form) [place];Living will;Out of facility DNR (pink MOST or yellow form) [place];Living will;Out of facility DNR (pink MOST or yellow form) [place];Living will;Out of facility DNR (pink MOST or yellow form)  Does patient want to make changes to medical advance directive? No - Patient declined No - Patient declined No - Patient declined No - Patient declined No - Patient declined No - Patient declined No - Patient declined  Copy of [place] in Chart? Yes - validated most recent copy scanned in chart (See row information) Yes - validated most recent copy scanned in chart (See row information) Yes - validated most recent copy scanned in chart (See row information) Yes - validated most recent copy scanned in chart (See row information) Yes - validated most recent copy scanned in chart (See row information) Yes - validated most  recent copy scanned in chart (See row information) Yes - validated most recent copy scanned in chart (See row information)  Would patient like information on creating a medical advance directive? - - - - - - -  Pre-existing out of facility DNR order (yellow form or pink MOST form) - Pink MOST form placed in chart (order not valid for inpatient use);Yellow form placed in chart (order not valid for inpatient use) - Pink MOST form placed in chart (order not valid for inpatient use);Yellow form placed in chart (order not valid for inpatient use) Yellow form placed in chart (order not valid for inpatient use);Pink MOST form placed in chart (order not valid for inpatient use) Yellow form placed in chart (order not valid for inpatient use);Pink MOST form placed in chart (order not valid for inpatient use) Yellow form placed in chart (order not valid for inpatient use);Pink MOST form placed in chart (order not valid for inpatient use)    Current Medications (verified) Outpatient Encounter Medications as of 09/29/2021  Medication Sig   estradiol (ESTRACE) 0.1 MG/GM vaginal cream Place 1 Applicatorful vaginally 3 (three) times a week.   acetaminophen (TYLENOL) 325 MG tablet Take 650 mg by mouth every 4 (four) hours as needed.   acetaminophen (TYLENOL) 325 MG tablet Take 2 tablets (650 mg total) by mouth at bedtime.   amitriptyline (ELAVIL) 10 MG tablet Take 0.5 tablets (5 mg total) by mouth at bedtime. (Patient taking differently: Take 10 mg by mouth at bedtime.)   amLODipine (NORVASC) 5 MG tablet Take 2.5 mg by mouth daily.   aspirin EC  81 MG tablet Take 81 mg by mouth. Once A Morning on Mon, Thu   Biotin 5000 MCG TABS Take 5,000 mcg by mouth daily.    bisacodyl (DULCOLAX) 10 MG suppository Place 10 mg rectally every other day.   Cholecalciferol (VITAMIN D) 50 MCG (2000 UT) CAPS Take 2,000 Units by mouth daily.    Cranberry 450 MG TABS Take 1 tablet by mouth daily.   docusate sodium (COLACE) 100 MG capsule  Take 100 mg by mouth daily.   magnesium hydroxide (MILK OF MAGNESIA) 400 MG/5ML suspension Take 30 mLs by mouth daily as needed for mild constipation.   metoprolol tartrate (LOPRESSOR) 50 MG tablet TAKE 1 TABLET BY MOUTH TWICE DAILY.   multivitamin-lutein (OCUVITE-LUTEIN) CAPS capsule Take 1 capsule by mouth 2 (two) times daily.    polyethylene glycol (MIRALAX / GLYCOLAX) 17 g packet Take 17 g by mouth daily as needed.   polyvinyl alcohol (LIQUIFILM TEARS) 1.4 % ophthalmic solution Place 1 drop into both eyes in the morning, at noon, in the evening, and at bedtime.   rOPINIRole (REQUIP) 1 MG tablet TAKE 1 TABLET EACH EVENING.   sennosides-docusate sodium (SENOKOT-S) 8.6-50 MG tablet Take 2 tablets by mouth daily.   zinc oxide 20 % ointment Apply 1 application topically as needed for irritation. Special Instructions: To buttocks after every incontinent episode and as needed for redness. May keep at bedside.   No facility-administered encounter medications on file as of 09/29/2021.    Allergies (verified) Patient has no known allergies.   History: Past Medical History:  Diagnosis Date   Anemia    Hypertension    OA (osteoarthritis)    Osteoporosis    Renal cyst    Past Surgical History:  Procedure Laterality Date   APPENDECTOMY     BLADDER SUSPENSION     CATARACT EXTRACTION     CESAREAN SECTION     REPLACEMENT TOTAL KNEE  1995   TONSILLECTOMY     Family History  Problem Relation Age of Onset   Colon cancer Mother    Arthritis Mother    Heart disease Father    Heart attack Father    Migraines Sister    Allergies Sister    Asthma Sister    Social History   Socioeconomic History   Marital status: Widowed    Spouse name: Not on file   Number of children: 2   Years of education: 3   Highest education level: Not on file  Occupational History   Occupation: Retired Marine scientist  Tobacco Use   Smoking status: Former    Types: E-cigarettes   Smokeless tobacco: Never   Tobacco  comments:    when she was 87 years old  Vaping Use   Vaping Use: Never used  Substance and Sexual Activity   Alcohol use: No   Drug use: No   Sexual activity: Never    Birth control/protection: None  Other Topics Concern   Not on file  Social History Narrative   Coffee-with Caffeine   Widow   2 sons   No pets   Retired Therapist, sports   No exercise   HCPOA, Living Will, No DNR   + glasses   + hearing aids   Social Determinants of Radio broadcast assistant Strain: Low Risk    Difficulty of Paying Living Expenses: Not hard at all  Food Insecurity: No Food Insecurity   Worried About Charity fundraiser in the Last Year: Never true   Ran  Out of Food in the Last Year: Never true  Transportation Needs: No Transportation Needs   Lack of Transportation (Medical): No   Lack of Transportation (Non-Medical): No  Physical Activity: Inactive   Days of Exercise per Week: 0 days   Minutes of Exercise per Session: 0 min  Stress: No Stress Concern Present   Feeling of Stress : Not at all  Social Connections: Socially Isolated   Frequency of Communication with Friends and Family: More than three times a week   Frequency of Social Gatherings with Friends and Family: More than three times a week   Attends Religious Services: Never   Marine scientist or Organizations: No   Attends Archivist Meetings: Never   Marital Status: Widowed    Tobacco Counseling Counseling given: Not Answered Tobacco comments: when she was 87 years old   Clinical Intake:  Pre-visit preparation completed: Yes  Pain : No/denies pain     BMI - recorded: 24.3 Nutritional Status: BMI of 19-24  Normal Nutritional Risks: None Diabetes: No  How often do you need to have someone help you when you read instructions, pamphlets, or other written materials from your doctor or pharmacy?: 3 - Sometimes What is the last grade level you completed in school?: Diploma college- 3 years for nursing  degree  Diabetic?No  Interpreter Needed?: No      Activities of Daily Living In your present state of health, do you have any difficulty performing the following activities: 09/29/2021  Hearing? Y  Vision? Y  Difficulty concentrating or making decisions? N  Walking or climbing stairs? Y  Dressing or bathing? Y  Doing errands, shopping? Y  Preparing Food and eating ? Y  Using the Toilet? Y  In the past six months, have you accidently leaked urine? Y  Do you have problems with loss of bowel control? Y  Managing your Medications? Y  Managing your Finances? Y  Housekeeping or managing your Housekeeping? Y  Some recent data might be hidden    Patient Care Team: Mast, Man X, NP as PCP - General (Internal Medicine) Wardell Honour, MD as Attending Physician (Family Medicine)  Indicate any recent Medical Services you may have received from other than [facility] providers in the past year (date may be approximate).     Assessment:   This is a routine wellness examination for Ann Jensen.  Hearing/Vision screen No results found.  Dietary issues and exercise activities discussed: Current Exercise Habits: The patient does not participate in regular exercise at present, Exercise limited by: orthopedic condition(s)   Goals Addressed             This Visit's Progress    DIET - INCREASE WATER INTAKE   Not on track      Depression Screen PHQ 2/9 Scores 09/29/2021 09/29/2021 07/23/2019 05/06/2018 01/01/2018 07/22/2017  PHQ - 2 Score 0 0 0 0 0 0    Fall Risk Fall Risk  09/29/2021 08/12/2019 07/23/2019 07/15/2019 04/15/2019  Falls in the past year? 1 0 0 0 0  Number falls in past yr: 0 0 0 0 0  Injury with Fall? 0 - 0 - 0  Risk for fall due to : History of fall(s);Impaired balance/gait;Orthopedic patient - - - -  Follow up Falls evaluation completed;Education provided;Falls prevention discussed - - - -    FALL RISK PREVENTION PERTAINING TO THE HOME:  Any stairs in or around the  home? No  If so, are there any without handrails?  No  Home free of loose throw rugs in walkways, pet beds, electrical cords, etc? Yes  Adequate lighting in your home to reduce risk of falls? Yes   ASSISTIVE DEVICES UTILIZED TO PREVENT FALLS:  Life alert? Yes  Use of a cane, walker or w/c? Yes  Grab bars in the bathroom? Yes  Shower chair or bench in shower? Yes  Elevated toilet seat or a handicapped toilet? Yes   TIMED UP AND GO:  Was the test performed? No .  Length of time to ambulate 10 feet: N/A sec.   Gait slow and steady with assistive device  Cognitive Function: MMSE - Mini Mental State Exam 09/29/2021 01/01/2018  Not completed: Refused -  Orientation to time - 5  Orientation to Place - 5  Registration - 3  Attention/ Calculation - 4  Recall - 1  Language- name 2 objects - 2  Language- repeat - 1  Language- follow 3 step command - 3  Language- read & follow direction - 1  Write a sentence - 1  Copy design - 0  Total score - 26     6CIT Screen 09/29/2021 07/23/2019  What Year? 0 points 0 points  What month? 0 points 0 points  What time? 0 points 0 points  Count back from 20 0 points 0 points  Months in reverse 0 points 0 points  Repeat phrase 4 points 4 points  Total Score 4 4    Immunizations Immunization History  Administered Date(s) Administered   Influenza, High Dose Seasonal PF 07/10/2017, 07/15/2019, 07/05/2020, 07/25/2021   Influenza,inj,Quad PF,6+ Mos 07/03/2018   Influenza-Unspecified 07/04/2015, 07/10/2017   Moderna SARS-COV2 Booster Vaccination 03/08/2021   Moderna Sars-Covid-2 Vaccination 10/05/2019, 11/02/2019, 08/15/2020, 06/21/2021   PFIZER(Purple Top)SARS-COV-2 Vaccination 06/21/2021   Pneumococcal Conjugate-13 08/18/2014   Pneumococcal Polysaccharide-23 07/26/2010   Tdap 11/12/2018    TDAP status: Up to date  Flu Vaccine status: Up to date  Pneumococcal vaccine status: Up to date  Covid-19 vaccine status: Completed  vaccines  Qualifies for Shingles Vaccine? Yes   Zostavax completed No   Shingrix Completed?: No.    Education has been provided regarding the importance of this vaccine. Patient has been advised to call insurance company to determine out of pocket expense if they have not yet received this vaccine. Advised may also receive vaccine at local pharmacy or Health Dept. Verbalized acceptance and understanding.  Screening Tests Health Maintenance  Topic Date Due   COVID-19 Vaccine (5 - Booster for Moderna series) 08/16/2021   Zoster Vaccines- Shingrix (1 of 2) 10/05/2021 (Originally 05/16/1972)   TETANUS/TDAP  11/12/2028   Pneumonia Vaccine 57+ Years old  Completed   INFLUENZA VACCINE  Completed   HPV VACCINES  Aged Out   DEXA SCAN  Discontinued    Health Maintenance  Health Maintenance Due  Topic Date Due   COVID-19 Vaccine (5 - Booster for Moderna series) 08/16/2021    Colorectal cancer screening: No longer required.   Mammogram status: No longer required due to advanced age.  Bone Density status: Ordered refused due to advanced age- no further testing. Pt provided with contact info and advised to call to schedule appt.  Lung Cancer Screening: (Low Dose CT Chest recommended if Age 41-80 years, 30 pack-year currently smoking OR have quit w/in 15years.) does not qualify.   Lung Cancer Screening Referral: No  Additional Screening:  Hepatitis C Screening: does not qualify- advanced age  Vision Screening: Recommended annual ophthalmology exams for early detection of glaucoma  and other disorders of the eye. Is the patient up to date with their annual eye exam?  Yes  Who is the provider or what is the name of the office in which the patient attends annual eye exams? Dr. Valetta Close If pt is not established with a provider, would they like to be referred to a provider to establish care? No .   Dental Screening: Recommended annual dental exams for proper oral hygiene  Community Resource  Referral / Chronic Care Management: CRR required this visit?  No   CCM required this visit?  No      Plan:     I have personally reviewed and noted the following in the patients chart:   Medical and social history Use of alcohol, tobacco or illicit drugs  Current medications and supplements including opioid prescriptions.  Functional ability and status Nutritional status Physical activity Advanced directives List of other physicians Hospitalizations, surgeries, and ER visits in previous 12 months Vitals Screenings to include cognitive, depression, and falls Referrals and appointments  In addition, I have reviewed and discussed with patient certain preventive protocols, quality metrics, and best practice recommendations. A written personalized care plan for preventive services as well as general preventive health recommendations were provided to patient.     Yvonna Alanis, NP   09/29/2021

## 2021-10-03 ENCOUNTER — Other Ambulatory Visit: Payer: Self-pay

## 2021-10-03 ENCOUNTER — Non-Acute Institutional Stay: Payer: PPO | Admitting: *Deleted

## 2021-10-05 DIAGNOSIS — D485 Neoplasm of uncertain behavior of skin: Secondary | ICD-10-CM | POA: Diagnosis not present

## 2021-10-05 DIAGNOSIS — C44622 Squamous cell carcinoma of skin of right upper limb, including shoulder: Secondary | ICD-10-CM | POA: Diagnosis not present

## 2021-10-10 DIAGNOSIS — E663 Overweight: Secondary | ICD-10-CM | POA: Diagnosis not present

## 2021-10-10 DIAGNOSIS — Z7982 Long term (current) use of aspirin: Secondary | ICD-10-CM | POA: Diagnosis not present

## 2021-10-10 DIAGNOSIS — I739 Peripheral vascular disease, unspecified: Secondary | ICD-10-CM | POA: Diagnosis not present

## 2021-10-10 DIAGNOSIS — G2581 Restless legs syndrome: Secondary | ICD-10-CM | POA: Diagnosis not present

## 2021-10-10 DIAGNOSIS — K5900 Constipation, unspecified: Secondary | ICD-10-CM | POA: Diagnosis not present

## 2021-10-10 DIAGNOSIS — H9192 Unspecified hearing loss, left ear: Secondary | ICD-10-CM | POA: Diagnosis not present

## 2021-10-10 DIAGNOSIS — I10 Essential (primary) hypertension: Secondary | ICD-10-CM | POA: Diagnosis not present

## 2021-10-10 DIAGNOSIS — G250 Essential tremor: Secondary | ICD-10-CM | POA: Diagnosis not present

## 2021-10-10 DIAGNOSIS — F419 Anxiety disorder, unspecified: Secondary | ICD-10-CM | POA: Diagnosis not present

## 2021-10-30 ENCOUNTER — Non-Acute Institutional Stay (SKILLED_NURSING_FACILITY): Payer: PPO | Admitting: Orthopedic Surgery

## 2021-10-30 ENCOUNTER — Encounter: Payer: Self-pay | Admitting: Orthopedic Surgery

## 2021-10-30 DIAGNOSIS — G2581 Restless legs syndrome: Secondary | ICD-10-CM

## 2021-10-30 DIAGNOSIS — R300 Dysuria: Secondary | ICD-10-CM

## 2021-10-30 DIAGNOSIS — K5901 Slow transit constipation: Secondary | ICD-10-CM

## 2021-10-30 DIAGNOSIS — I10 Essential (primary) hypertension: Secondary | ICD-10-CM | POA: Diagnosis not present

## 2021-10-30 DIAGNOSIS — R251 Tremor, unspecified: Secondary | ICD-10-CM | POA: Diagnosis not present

## 2021-10-30 DIAGNOSIS — R634 Abnormal weight loss: Secondary | ICD-10-CM | POA: Diagnosis not present

## 2021-10-30 DIAGNOSIS — D4989 Neoplasm of unspecified behavior of other specified sites: Secondary | ICD-10-CM | POA: Diagnosis not present

## 2021-10-30 DIAGNOSIS — M159 Polyosteoarthritis, unspecified: Secondary | ICD-10-CM | POA: Diagnosis not present

## 2021-10-30 DIAGNOSIS — H04123 Dry eye syndrome of bilateral lacrimal glands: Secondary | ICD-10-CM | POA: Diagnosis not present

## 2021-10-30 DIAGNOSIS — R269 Unspecified abnormalities of gait and mobility: Secondary | ICD-10-CM | POA: Diagnosis not present

## 2021-10-30 NOTE — Progress Notes (Signed)
Location:   [place] Room Number: 1-A Place of Service:  SNF (31) Provider:  Windell Moulding, NP    Patient Care Team: Mast, Man X, NP as PCP - General (Internal Medicine) Wardell Honour, MD as Attending Physician (Family Medicine)  Extended Emergency Contact Information Primary Emergency Contact: [facility] Address: [address] [address] [place] Phone: [phone] Mobile Phone: [phone] Relation: Son Secondary Emergency Contact: [facility] Address: [address]          [place], [place] [address] [place] Phone: [phone] Relation: Son  Code Status:  DNR Goals of care: Advanced Directive information Advanced Directives 10/30/2021  Does Patient Have a Medical Advance Directive? Yes  Type of Paramedic of [place];Living will;Out of facility DNR (pink MOST or yellow form)  Does patient want to make changes to medical advance directive? No - Patient declined  Copy of [place] in Chart? Yes - validated most recent copy scanned in chart (See row information)  Would patient like information on creating a medical advance directive? -  Pre-existing out of facility DNR order (yellow form or pink MOST form) -     Chief Complaint  Patient presents with   Medical Management of Chronic Issues    Routine Visit.   Immunizations    Discuss the need for Shingrix vaccine, and Covid Booster.     HPI:  Pt is a 88 y.o. female seen today for medical management of chronic diseases.    She continues to reside on the skilled nursing unit at [facility]. Past medical history includes: hypertension, PVD, osteoarthritis of multiple joints, CKD stage 3, posterior tendon dysfunction with right foot drop, constipation, restless leg syndrome and anxiety.    HTN- BUN/creat 26/0.8 04/15/2021, remains on amlodipine and metoprolol OA- reports right  leg/shoulder/back pain, pain worse in the AM, improves throughout the day, tylenol 650 po prn Dysuria- reports improved symptoms since drinking more water, unsuccessful trial of Uribel 06/2021, last urine culture inconclusive- recollection recommended,Estrace started 08/2021 does not believed it helped- reports increased breast tenderness,she was interested in urology consult last month but not interested at this time, concerned exam and getting on table will be too painful, remains on amitriptyline and cranberry supplement RLS- she is able to sleep at night, requip qhs Weight loss/poor appetite- gained 2 lbs last month, she admits to enjoying holiday foods, she dislikes the food served at [employer], not interested in dietary shakes, followed by dietary Gait abnormality- uses power wheelchair due to right foot drop and right tibial tendon abnormality Constipation- LBM 01/29,reports loose stools this week, remains on colace, senna and miralax prn Dry eyes- continues to wake up with painful crusting to eyes, remains artificial tears Growth right forearm- suspected neoplasm removed 10/05/2021, healing well, denies pain  No recent falls or injuries. Ambulates well with power wheelchair.   Recent blood pressures:  01/24- 106/90  01/17- 123/87  01/10- 109/77  Recent weights:  01/04- 123.7 lbs  12/07- 121 lbs  11/07- 119 lbs    Past Medical History:  Diagnosis Date   Anemia    Hypertension    OA (osteoarthritis)    Osteoporosis    Renal cyst    Past Surgical History:  Procedure Laterality Date   APPENDECTOMY     BLADDER SUSPENSION     CATARACT EXTRACTION     CESAREAN SECTION  REPLACEMENT TOTAL KNEE  1995   TONSILLECTOMY      No Known Allergies  Allergies as of 10/30/2021   No Known Allergies      Medication List        Accurate as of October 30, 2021 10:29 AM. If you have any questions, ask your nurse or doctor.          acetaminophen 325 MG tablet Commonly known  as: TYLENOL Take 650 mg by mouth every 4 (four) hours as needed.   acetaminophen 325 MG tablet Commonly known as: Tylenol Take 2 tablets (650 mg total) by mouth at bedtime.   amitriptyline 10 MG tablet Commonly known as: ELAVIL Take 10 mg by mouth at bedtime. What changed: Another medication with the same name was removed. Continue taking this medication, and follow the directions you see here. Changed by: Yvonna Alanis, NP   amLODipine 5 MG tablet Commonly known as: NORVASC Take 2.5 mg by mouth daily.   aspirin EC 81 MG tablet Take 81 mg by mouth. Once A Morning on Mon, Thu   Biotin 5000 MCG Tabs Take 5,000 mcg by mouth daily.   bisacodyl 10 MG suppository Commonly known as: DULCOLAX Place 10 mg rectally every other day.   Cranberry 450 MG Tabs Take 1 tablet by mouth daily.   [facility] Bacitracin ointment Generic drug: bacitracin Apply 1 application topically daily.   docusate sodium 100 MG capsule Commonly known as: COLACE Take 100 mg by mouth daily.   estradiol 0.1 MG/GM vaginal cream Commonly known as: ESTRACE Place 1 Applicatorful vaginally 3 (three) times a week.   magnesium hydroxide 400 MG/5ML suspension Commonly known as: MILK OF MAGNESIA Take 30 mLs by mouth daily as needed for mild constipation.   metoprolol tartrate 50 MG tablet Commonly known as: LOPRESSOR TAKE 1 TABLET BY MOUTH TWICE DAILY.   multivitamin-lutein Caps capsule Take 1 capsule by mouth 2 (two) times daily.   OVER THE COUNTER MEDICATION Place 1 application into both eyes daily. 0.25 inch Ribbon: Apply to left and right eye/ conjunctival sac.   polyethylene glycol 17 g packet Commonly known as: MIRALAX / GLYCOLAX Take 17 g by mouth daily as needed.   polyvinyl alcohol 1.4 % ophthalmic solution Commonly known as: LIQUIFILM TEARS Place 1 drop into both eyes in the morning, at noon, in the evening, and at bedtime.   rOPINIRole 1 MG tablet Commonly known as: REQUIP TAKE 1 TABLET EACH  EVENING.   sennosides-docusate sodium 8.6-50 MG tablet Commonly known as: SENOKOT-S Take 2 tablets by mouth daily.   Vitamin D 50 MCG (2000 UT) Caps Take 2,000 Units by mouth daily.   zinc oxide 20 % ointment Apply 1 application topically as needed for irritation. Special Instructions: To buttocks after every incontinent episode and as needed for redness. May keep at bedside.        Review of Systems  Constitutional:  Negative for activity change, appetite change, chills, fatigue and fever.  HENT:  Positive for hearing loss. Negative for trouble swallowing.   Eyes:  Negative for visual disturbance.  Respiratory:  Negative for cough, shortness of breath and wheezing.   Cardiovascular:  Positive for leg swelling. Negative for chest pain.  Gastrointestinal:  Positive for constipation. Negative for abdominal distention, abdominal pain, diarrhea, nausea and vomiting.  Genitourinary:  Positive for dysuria. Negative for frequency, hematuria, vaginal bleeding and vaginal discharge.       Incontinence  Musculoskeletal:  Positive for arthralgias, back pain, gait problem  and myalgias.  Skin:  Positive for wound.  Neurological:  Positive for tremors and weakness. Negative for dizziness and headaches.  Psychiatric/Behavioral:  Positive for dysphoric mood. Negative for confusion and sleep disturbance. The patient is not nervous/anxious.    Immunization History  Administered Date(s) Administered   Influenza, High Dose Seasonal PF 07/10/2017, 07/15/2019, 07/05/2020, 07/25/2021   Influenza,inj,Quad PF,6+ Mos 07/03/2018   Influenza-Unspecified 07/04/2015, 07/10/2017   Moderna SARS-COV2 Booster Vaccination 03/08/2021   Moderna Sars-Covid-2 Vaccination 10/05/2019, 11/02/2019, 08/15/2020, 06/21/2021   PFIZER(Purple Top)SARS-COV-2 Vaccination 06/21/2021   Pneumococcal Conjugate-13 08/18/2014   Pneumococcal Polysaccharide-23 07/26/2010   Tdap 11/12/2018   Pertinent  Health Maintenance Due  Topic  Date Due   INFLUENZA VACCINE  Completed   DEXA SCAN  Discontinued   Fall Risk 04/15/2019 07/15/2019 07/23/2019 08/12/2019 09/29/2021  Falls in the past year? 0 0 0 0 1  Was there an injury with Fall? 0 - 0 - 0  Fall Risk Category Calculator 0 - 0 - 1  Fall Risk Category Low - Low - Low  Patient Fall Risk Level Low fall risk - - - -  Patient at Risk for Falls Due to - - - - History of fall(s);Impaired balance/gait;Orthopedic patient  Fall risk Follow up - - - - Falls evaluation completed;Education provided;Falls prevention discussed   Functional Status Survey:    Vitals:   10/30/21 1022  BP: 106/90  Pulse: 66  Resp: 18  Temp: (!) 96.7 F (35.9 C)  SpO2: 98%  Weight: 123 lb 11.2 oz (56.1 kg)  Height: 5' (1.524 m)   Body mass index is 24.16 kg/m. Physical Exam Vitals reviewed.  Constitutional:      General: She is not in acute distress. HENT:     Head: Normocephalic.     Right Ear: There is no impacted cerumen.     Left Ear: There is no impacted cerumen.     Nose: Nose normal.     Mouth/Throat:     Mouth: Mucous membranes are moist.  Eyes:     General:        Right eye: No discharge.        Left eye: No discharge.  Neck:     Vascular: No carotid bruit.  Cardiovascular:     Rate and Rhythm: Normal rate and regular rhythm.     Pulses: Normal pulses.     Heart sounds: Normal heart sounds. No murmur heard. Pulmonary:     Effort: Pulmonary effort is normal. No respiratory distress.     Breath sounds: Normal breath sounds. No wheezing.  Abdominal:     General: Bowel sounds are normal. There is no distension.     Palpations: Abdomen is soft.     Tenderness: There is no abdominal tenderness.  Musculoskeletal:     Cervical back: Neck supple.     Right lower leg: Edema present.     Left lower leg: Edema present.     Comments: Non pitting edema to lower extremities, right drop foot- tender to touch  Lymphadenopathy:     Cervical: No cervical adenopathy.  Skin:     General: Skin is warm and dry.     Capillary Refill: Capillary refill takes less than 2 seconds.     Comments: 1 cm biopsy site to right anterior forearm, wound closed, no drainage, surrounding skin intact, toes appear mottled- blanchable  Neurological:     General: No focal deficit present.     Mental Status: She is alert and  oriented to person, place, and time.     Motor: Weakness present.     Gait: Gait abnormal.     Comments: Power wheelchair  Psychiatric:        Mood and Affect: Mood normal.        Behavior: Behavior normal.    Labs reviewed: Recent Labs    04/15/21 0000  NA 139  K 3.9  CL 104  CO2 29*  BUN 26*  CREATININE 0.8  CALCIUM 9.0   Recent Labs    04/15/21 0000  AST 11*  ALT 6*  ALKPHOS 58  ALBUMIN 3.1*   Recent Labs    04/15/21 0000  WBC 8.0  NEUTROABS 4,032.00  HGB 11.3*  HCT 34*  PLT 196   Lab Results  Component Value Date   TSH 1.66 09/20/2020   No results found for: HGBA1C Lab Results  Component Value Date   CHOL 175 07/23/2019   HDL 54 07/23/2019   LDLCALC 105 (H) 07/23/2019   TRIG 74 07/23/2019   CHOLHDL 3.2 07/23/2019    Significant Diagnostic Results in last 30 days:  No results found.  Assessment/Plan 1. Essential hypertension - controlled - cont amlodipine and norvasc  2. Primary osteoarthritis involving multiple joints - cont tylenol  3. Dysuria - improved symptoms when she drinks more water - past cultures negative - unsuccessful trial of Uribel in past - not interested in urology consult due to pain associated with exam - would like Estrace discontinued due to breast tenderness - cont amitriptyline and cranberry supplement  4. RLS (restless legs syndrome) - cont Requip  5. Weight loss - gained 2 lbs last month - does not want supplement shakes - cont monthly weights  6. Gait abnormality - does well with PWC - cont skilled nursing care  7. Slow transit constipation - LBM 01/29, abdomen soft - reports  loose stools past week - discontinue colace - cont senna and miralax prn  8. Dry eyes, bilateral - reports crusts to eyes - suspect due to ectropion - start Genteal ointment qhs  9. Tremor of both hands - continues to be able to perform ADLs  10. 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

## 2021-11-02 DIAGNOSIS — E079 Disorder of thyroid, unspecified: Secondary | ICD-10-CM | POA: Diagnosis not present

## 2021-11-02 LAB — TSH: TSH: 2.01 (ref 0.41–5.90)

## 2021-11-02 LAB — HEPATIC FUNCTION PANEL
ALT: 8 (ref 7–35)
AST: 11 — AB (ref 13–35)
Alkaline Phosphatase: 65 (ref 25–125)
Bilirubin, Total: 0.5

## 2021-11-02 LAB — COMPREHENSIVE METABOLIC PANEL
Albumin: 3.6 (ref 3.5–5.0)
Calcium: 9.5 (ref 8.7–10.7)
Globulin: 3

## 2021-11-02 LAB — CBC AND DIFFERENTIAL
HCT: 36 (ref 36–46)
Hemoglobin: 11.9 — AB (ref 12.0–16.0)
Neutrophils Absolute: 3012
Platelets: 215 (ref 150–399)
WBC: 6.8

## 2021-11-02 LAB — BASIC METABOLIC PANEL
BUN: 29 — AB (ref 4–21)
Chloride: 103 (ref 99–108)
Creatinine: 0.9 (ref 0.5–1.1)
Glucose: 71
Potassium: 4.2 (ref 3.4–5.3)
Sodium: 139 (ref 137–147)

## 2021-11-02 LAB — CBC: RBC: 3.48 — AB (ref 3.87–5.11)

## 2021-11-13 ENCOUNTER — Non-Acute Institutional Stay (SKILLED_NURSING_FACILITY): Payer: PPO | Admitting: Nurse Practitioner

## 2021-11-13 ENCOUNTER — Encounter: Payer: Self-pay | Admitting: Nurse Practitioner

## 2021-11-13 DIAGNOSIS — G2581 Restless legs syndrome: Secondary | ICD-10-CM | POA: Diagnosis not present

## 2021-11-13 DIAGNOSIS — M159 Polyosteoarthritis, unspecified: Secondary | ICD-10-CM | POA: Diagnosis not present

## 2021-11-13 DIAGNOSIS — F418 Other specified anxiety disorders: Secondary | ICD-10-CM

## 2021-11-13 DIAGNOSIS — K5901 Slow transit constipation: Secondary | ICD-10-CM | POA: Diagnosis not present

## 2021-11-13 DIAGNOSIS — Z66 Do not resuscitate: Secondary | ICD-10-CM | POA: Diagnosis not present

## 2021-11-13 DIAGNOSIS — M150 Primary generalized (osteo)arthritis: Secondary | ICD-10-CM

## 2021-11-13 DIAGNOSIS — I10 Essential (primary) hypertension: Secondary | ICD-10-CM

## 2021-11-13 DIAGNOSIS — F5105 Insomnia due to other mental disorder: Secondary | ICD-10-CM | POA: Diagnosis not present

## 2021-11-13 DIAGNOSIS — R300 Dysuria: Secondary | ICD-10-CM | POA: Diagnosis not present

## 2021-11-13 DIAGNOSIS — I739 Peripheral vascular disease, unspecified: Secondary | ICD-10-CM | POA: Diagnosis not present

## 2021-11-13 NOTE — Assessment & Plan Note (Signed)
Her mood is stable, on Amitriptyline, TSH 2.01 11/02/21

## 2021-11-13 NOTE — Assessment & Plan Note (Signed)
maintained on Ropinirole

## 2021-11-13 NOTE — Assessment & Plan Note (Signed)
chronic, off Estrace 2/2 breast tenderness, failed Uribel, declined Urology, on Amitriptyline.

## 2021-11-13 NOTE — Assessment & Plan Note (Signed)
Blood pressure is controlled, takes Metoprolol, Amlodipine. Bun/creat 29/0.9 11/02/21

## 2021-11-13 NOTE — Assessment & Plan Note (Signed)
stable, on Bisacodyl suppository qod, Senokot S II qd.

## 2021-11-13 NOTE — Progress Notes (Addendum)
Location:  [place] Room Number: N01/A Place of Service:  SNF (31) Provider:  Lien Lyman X, NP  Patient Care Team: Sharman Garrott X, NP as PCP - General (Internal Medicine) Wardell Honour, MD as Attending Physician (Family Medicine)  Extended Emergency Contact Information Primary Emergency Contact: [facility] Address: [address] [address] [place] Phone: [phone] Mobile Phone: [phone] Relation: Son Secondary Emergency Contact: [facility] Address: [address]          [place], [place] [address] [place] Phone: [phone] Relation: Son  Code Status:  DNR Goals of care: Advanced Directive information Advanced Directives 11/20/2021  Does Patient Have a Medical Advance Directive? Yes  Type of Paramedic of [place];Living will;Out of facility DNR (pink MOST or yellow form)  Does patient want to make changes to medical advance directive? No - Patient declined  Copy of [place] in Chart? Yes - validated most recent copy scanned in chart (See row information)  Would patient like information on creating a medical advance directive? -  Pre-existing out of facility DNR order (yellow form or pink MOST form) -     Chief Complaint  Patient presents with   Acute Visit    Right leg swelling    HPI:  Pt is a 88 y.o. female seen today for an acute visit for reported the patient's increased R leg swelling. The patient denied cough, SOB, or phlegm production. She is afebrile, no O2 desaturation. Her weights has been stable # 123 Ibs.     OA chronic right knee pain, right ankle turned lateral deformity, no ambulatory, mechanical lift for transfer, w/c for mobility. takes Tylenol. 12/09/20 X-ray anterior subluxation of the tibial portion of the knee prosthesis, large R knee effusion. Dr. Sabra Heck, the patient's HPOA is aware of it.   Dysuria, chronic,  off Estrace 2/2 breast tenderness, failed Uribel, declined Urology, on Amitriptyline.              Constipation, stable, on Bisacodyl suppository qod, Senokot S II qd.               Her mood is stable, on Amitriptyline, TSH 2.01 11/02/21             RLS, maintained on Ropinirole             HTN, takes Metoprolol, Amlodipine. Bun/creat 29/0.9 11/02/21             Edema BLE, R>L, at her baseline, mostly in the right foot.    Past Medical History:  Diagnosis Date   Anemia    Hypertension    OA (osteoarthritis)    Osteoporosis    Renal cyst    Past Surgical History:  Procedure Laterality Date   APPENDECTOMY     BLADDER SUSPENSION     CATARACT EXTRACTION     CESAREAN SECTION     REPLACEMENT TOTAL KNEE  1995   TONSILLECTOMY      No Known Allergies  Outpatient Encounter Medications as of 11/13/2021  Medication Sig   acetaminophen (TYLENOL) 325 MG tablet Take 650 mg by mouth every 4 (four) hours as needed.   acetaminophen (TYLENOL) 325 MG tablet Take 2 tablets (650 mg total) by mouth at bedtime.   amitriptyline (ELAVIL) 10 MG tablet Take 10 mg by mouth at bedtime.   aspirin EC 81 MG tablet Take 81  mg by mouth in the morning. Mondays and Thursdays   Biotin 5000 MCG TABS Take 5,000 mcg by mouth daily.    bisacodyl (DULCOLAX) 10 MG suppository Place 10 mg rectally every other day.   Cholecalciferol (VITAMIN D) 50 MCG (2000 UT) CAPS Take 2,000 Units by mouth daily.    magnesium hydroxide (MILK OF MAGNESIA) 400 MG/5ML suspension Take 30 mLs by mouth daily as needed for mild constipation.   metoprolol tartrate (LOPRESSOR) 50 MG tablet TAKE 1 TABLET BY MOUTH TWICE DAILY.   multivitamin-lutein (OCUVITE-LUTEIN) CAPS capsule Take 1 capsule by mouth 2 (two) times daily.    OVER THE COUNTER MEDICATION Place 1 application into both eyes daily. 0.25 inch Ribbon: Apply to left and right eye/ conjunctival sac.   polyethylene glycol (MIRALAX / GLYCOLAX) 17 g packet Take 17 g by  mouth daily.   polyvinyl alcohol (LIQUIFILM TEARS) 1.4 % ophthalmic solution Place 1 drop into both eyes in the morning, at noon, in the evening, and at bedtime.   rOPINIRole (REQUIP) 1 MG tablet TAKE 1 TABLET EACH EVENING.   sennosides-docusate sodium (SENOKOT-S) 8.6-50 MG tablet Take 2 tablets by mouth daily.   zinc oxide 20 % ointment Apply 1 application topically as needed for irritation. Special Instructions: To buttocks after every incontinent episode and as needed for redness. May keep at bedside.   [DISCONTINUED] amLODipine (NORVASC) 5 MG tablet Take 2.5 mg by mouth daily. (Patient not taking: Reported on 11/20/2021)   [DISCONTINUED] bacitracin ointment Apply 1 application topically daily. (Patient not taking: Reported on 11/13/2021)   [DISCONTINUED] Cranberry 450 MG TABS Take 1 tablet by mouth daily. (Patient not taking: Reported on 11/13/2021)   No facility-administered encounter medications on file as of 11/13/2021.    Review of Systems  Constitutional:  Negative for fatigue, fever and unexpected weight change.       #2-3Ibs weight gained in the past 2-3 weeks, no apparent fluid retention.   HENT:  Positive for hearing loss. Negative for congestion and voice change.   Eyes:  Negative for visual disturbance.  Respiratory:  Negative for cough.   Cardiovascular:  Positive for leg swelling.  Gastrointestinal:  Negative for abdominal pain and constipation.  Genitourinary:  Positive for dysuria. Negative for hematuria and urgency.       Incontinent of urine.   Musculoskeletal:  Positive for arthralgias and gait problem.       Multiple sites, mostly right knee, right ankle turned laterally, uses soft brace.   Skin:  Negative for color change.       Ecchymoses/bruise  Neurological:  Negative for speech difficulty, weakness and light-headedness.       BLE feels like "cardboard"  Psychiatric/Behavioral:  Negative for behavioral problems and sleep disturbance. The patient is not  nervous/anxious.    Immunization History  Administered Date(s) Administered   Influenza, High Dose Seasonal PF 07/10/2017, 07/15/2019, 07/05/2020, 07/25/2021   Influenza,inj,Quad PF,6+ Mos 07/03/2018   Influenza-Unspecified 07/04/2015, 07/10/2017   Moderna SARS-COV2 Booster Vaccination 03/08/2021   Moderna Sars-Covid-2 Vaccination 10/05/2019, 11/02/2019, 08/15/2020, 06/21/2021   PFIZER(Purple Top)SARS-COV-2 Vaccination 06/21/2021   Pneumococcal Conjugate-13 08/18/2014   Pneumococcal Polysaccharide-23 07/26/2010   Tdap 11/12/2018   Pertinent  Health Maintenance Due  Topic Date Due   INFLUENZA VACCINE  Completed   DEXA SCAN  Discontinued   Fall Risk 04/15/2019 07/15/2019 07/23/2019 08/12/2019 09/29/2021  Falls in the past year? 0 0 0 0 1  Was there an injury with Fall? 0 - 0 - 0  Fall  Risk Category Calculator 0 - 0 - 1  Fall Risk Category Low - Low - Low  Patient Fall Risk Level Low fall risk - - - -  Patient at Risk for Falls Due to - - - - History of fall(s);Impaired balance/gait;Orthopedic patient  Fall risk Follow up - - - - Falls evaluation completed;Education provided;Falls prevention discussed   Functional Status Survey:    Vitals:   11/13/21 1041  BP: 103/73  Pulse: 71  Resp: 20  Temp: (!) 97.2 F (36.2 C)  SpO2: 97%  Weight: 123 lb 9.6 oz (56.1 kg)  Height: 5' (1.524 m)   Body mass index is 24.14 kg/m. Physical Exam Vitals and nursing note reviewed.  Constitutional:      Appearance: Normal appearance.  HENT:     Head: Normocephalic and atraumatic.     Mouth/Throat:     Mouth: Mucous membranes are moist.  Eyes:     Extraocular Movements: Extraocular movements intact.     Conjunctiva/sclera: Conjunctivae normal.     Pupils: Pupils are equal, round, and reactive to light.  Cardiovascular:     Rate and Rhythm: Normal rate and regular rhythm.     Heart sounds: Murmur heard.     Comments: The R DP pulse was not felt.  Pulmonary:     Effort:  Pulmonary effort is normal.     Breath sounds: Rales present.     Comments: R basilar rales.  Abdominal:     General: Bowel sounds are normal.     Palpations: Abdomen is soft.     Tenderness: There is no abdominal tenderness.  Musculoskeletal:     Cervical back: Normal range of motion and neck supple.     Right lower leg: Edema present.     Left lower leg: Edema present.     Comments: Trace edema BLE, R>L. Pain in the right knee with ROM is chronic.   Skin:    General: Skin is warm and dry.  Neurological:     General: No focal deficit present.     Mental Status: She is alert and oriented to person, place, and time. Mental status is at baseline.     Gait: Gait abnormal.  Psychiatric:        Mood and Affect: Mood normal.        Behavior: Behavior normal.        Thought Content: Thought content normal.    Labs reviewed: Recent Labs    04/15/21 0000 11/02/21 0000  NA 139 139  K 3.9 4.2  CL 104 103  CO2 29*  --   BUN 26* 29*  CREATININE 0.8 0.9  CALCIUM 9.0 9.5   Recent Labs    04/15/21 0000 11/02/21 0000  AST 11* 11*  ALT 6* 8  ALKPHOS 58 65  ALBUMIN 3.1* 3.6   Recent Labs    04/15/21 0000 11/02/21 0000  WBC 8.0 6.8  NEUTROABS 4,032.00 3,012.00  HGB 11.3* 11.9*  HCT 34* 36  PLT 196 215   Lab Results  Component Value Date   TSH 2.01 11/02/2021   No results found for: HGBA1C Lab Results  Component Value Date   CHOL 175 07/23/2019   HDL 54 07/23/2019   LDLCALC 105 (H) 07/23/2019   TRIG 74 07/23/2019   CHOLHDL 3.2 07/23/2019    Significant Diagnostic Results in last 30 days:  No results found.  Assessment/Plan PVD (peripheral vascular disease) (HCC) R>L, at her baseline, mostly in the right foot.  reported the patient's increased R leg swelling. The patient denied cough, SOB, or phlegm production. She is afebrile, no O2 desaturation. Her weights has been stable # 123 Ibs.  11/20/21 Uric acid 4.4, wbc 9.2, Hgb 12.1, plt 239, neutrophils 58.4 11/21/21  Amy Furosemide 20mg  qd x 2 days.   Osteoarthritis of multiple joints chronic right knee pain, right ankle turned lateral deformity, no ambulatory, mechanical lift for transfer, w/c for mobility. takes Tylenol. 12/09/20 X-ray anterior subluxation of the tibial portion of the knee prosthesis, large R knee effusion. Dr. Sabra Heck, the patient's HPOA is aware of it.   Dysuria  chronic, off Estrace 2/2 breast tenderness, failed Uribel, declined Urology, on Amitriptyline.   Slow transit constipation  stable, on Bisacodyl suppository qod, Senokot S II qd.    Insomnia secondary to depression with anxiety Her mood is stable, on Amitriptyline, TSH 2.01 11/02/21  RLS (restless legs syndrome) maintained on Ropinirole  Essential hypertension Blood pressure is controlled, takes Metoprolol, Amlodipine. Bun/creat 29/0.9 11/02/21     Family/ staff Communication: plan of care reviewed with the patient and charge nurse.   Labs/tests ordered:  none  Time spend 25 minutes.

## 2021-11-13 NOTE — Assessment & Plan Note (Signed)
chronic right knee pain, right ankle turned lateral deformity, no ambulatory, mechanical lift for transfer, w/c for mobility. takes Tylenol. 12/09/20 X-ray anterior subluxation of the tibial portion of the knee prosthesis, large R knee effusion. Dr. Sabra Heck, the patient's HPOA is aware of it.

## 2021-11-13 NOTE — Assessment & Plan Note (Addendum)
R>L, at her baseline, mostly in the right foot.  reported the patient's increased R leg swelling. The patient denied cough, SOB, or phlegm production. She is afebrile, no O2 desaturation. Her weights has been stable # 123 Ibs.  11/20/21 Uric acid 4.4, wbc 9.2, Hgb 12.1, plt 239, neutrophils 58.4 11/21/21 Amy Furosemide 20mg  qd x 2 days.

## 2021-11-14 ENCOUNTER — Encounter: Payer: Self-pay | Admitting: Nurse Practitioner

## 2021-11-20 ENCOUNTER — Encounter: Payer: Self-pay | Admitting: Orthopedic Surgery

## 2021-11-20 ENCOUNTER — Non-Acute Institutional Stay (SKILLED_NURSING_FACILITY): Payer: PPO | Admitting: Orthopedic Surgery

## 2021-11-20 DIAGNOSIS — I739 Peripheral vascular disease, unspecified: Secondary | ICD-10-CM | POA: Diagnosis not present

## 2021-11-20 DIAGNOSIS — M159 Polyosteoarthritis, unspecified: Secondary | ICD-10-CM | POA: Diagnosis not present

## 2021-11-20 DIAGNOSIS — H04123 Dry eye syndrome of bilateral lacrimal glands: Secondary | ICD-10-CM | POA: Diagnosis not present

## 2021-11-20 DIAGNOSIS — K5901 Slow transit constipation: Secondary | ICD-10-CM

## 2021-11-20 DIAGNOSIS — G2581 Restless legs syndrome: Secondary | ICD-10-CM | POA: Diagnosis not present

## 2021-11-20 DIAGNOSIS — R269 Unspecified abnormalities of gait and mobility: Secondary | ICD-10-CM | POA: Diagnosis not present

## 2021-11-20 DIAGNOSIS — R300 Dysuria: Secondary | ICD-10-CM

## 2021-11-20 DIAGNOSIS — I10 Essential (primary) hypertension: Secondary | ICD-10-CM | POA: Diagnosis not present

## 2021-11-20 DIAGNOSIS — D649 Anemia, unspecified: Secondary | ICD-10-CM | POA: Diagnosis not present

## 2021-11-20 DIAGNOSIS — R634 Abnormal weight loss: Secondary | ICD-10-CM | POA: Diagnosis not present

## 2021-11-20 LAB — CBC AND DIFFERENTIAL
HCT: 36 (ref 36–46)
Hemoglobin: 12.1 (ref 12.0–16.0)
Neutrophils Absolute: 5373
Platelets: 239 (ref 150–399)
WBC: 9.2

## 2021-11-20 LAB — CBC: RBC: 3.48 — AB (ref 3.87–5.11)

## 2021-11-20 NOTE — Progress Notes (Signed)
Location:  [facility] Room Number: N01/A Place of Service:  SNF (31) Provider: Yvonna Alanis, NP  Patient Care Team: Mast, Man X, NP as PCP - General (Internal Medicine) Wardell Honour, MD as Attending Physician (Family Medicine)  Extended Emergency Contact Information Primary Emergency Contact: [facility] Address: [place] [address] [place] Phone: [phone] Mobile Phone: [phone] Relation: Son Secondary Emergency Contact: [facility] Address: [address], [place] [address] [place] Phone: [phone] Relation: Son  Code Status:  DNR Goals of care: Advanced Directive information Advanced Directives 11/20/2021  Does Patient Have a Medical Advance Directive? Yes  Type of Paramedic of [place];Living will;Out of facility DNR (pink MOST or yellow form)  Does patient want to make changes to medical advance directive? No - Patient declined  Copy of [place] in Chart? Yes - validated most recent copy scanned in chart (See row information)  Would patient like information on creating a medical advance directive? -  Pre-existing out of facility DNR order (yellow form or pink MOST form) -     Chief Complaint  Patient presents with   Acute Visit    Right greater toe pain    HPI:  Pt is a 88 y.o. female seen today for an acute visit for right great toe pain.   02/13 seen by NP for right foot swelling, appeared to be at baseline at the time. In the past few days, she reports increased right foot swelling. She has a history of right foot drop due to posterior tendon dysfunction. She has been using her recliner and elevating legs daily. This morning, she woke up with stabbing pain to right great toe. Pain rated 4/10, tender to touch, describes pain as intermittent. Denies sob, cough or weight gain.   HTN- BUN/creat 29/0.9  11/02/2021, remains on amlodipine and metoprolol OA- reports right leg/shoulder/back pain, pain worse in the AM, improves throughout the day, tylenol 650 po prn Dysuria- reports improved symptoms since drinking more water, unsuccessful trial of Uribel 06/2021, last urine culture inconclusive- recollection recommended,Estrace trial unsuccessful- also gave her breast tenderness, she was interested in urology consult last month but not interested at this time, concerned exam and getting on table will be too painful, remains on amitriptyline RLS- she is able to sleep at night, requip qhs Weight loss/poor appetite-  weight stable, she dislikes the food served at [employer], not interested in dietary shakes, followed by dietary Gait abnormality- uses power wheelchair due to right foot drop and right tibial tendon abnormality Constipation- LBM 02/20, remains on colace, senna and miralax prn Dry eyes- continues to wake up with painful crusting to eyes, scheduled to see eye doctor this week, remains artificial tears  Past Medical History:  Diagnosis Date   Anemia    Hypertension    OA (osteoarthritis)    Osteoporosis    Renal cyst    Past Surgical History:  Procedure Laterality Date   APPENDECTOMY     BLADDER SUSPENSION     CATARACT EXTRACTION     CESAREAN SECTION     REPLACEMENT TOTAL KNEE  1995   TONSILLECTOMY      No Known Allergies  Outpatient Encounter Medications as of 11/20/2021  Medication Sig   acetaminophen (TYLENOL) 325 MG tablet Take 650 mg by mouth every 4 (four)  hours as needed.   acetaminophen (TYLENOL) 325 MG tablet Take 2 tablets (650 mg total) by mouth at bedtime.   amitriptyline (ELAVIL) 10 MG tablet Take 10 mg by mouth at bedtime.   aspirin EC 81 MG tablet Take 81 mg by mouth in the morning. Mondays and Thursdays   Biotin 5000 MCG TABS Take 5,000 mcg by mouth daily.    bisacodyl (DULCOLAX) 10 MG suppository Place 10 mg rectally every other day.   Cholecalciferol (VITAMIN  D) 50 MCG (2000 UT) CAPS Take 2,000 Units by mouth daily.    magnesium hydroxide (MILK OF MAGNESIA) 400 MG/5ML suspension Take 30 mLs by mouth daily as needed for mild constipation.   metoprolol tartrate (LOPRESSOR) 50 MG tablet TAKE 1 TABLET BY MOUTH TWICE DAILY.   multivitamin-lutein (OCUVITE-LUTEIN) CAPS capsule Take 1 capsule by mouth 2 (two) times daily.    OVER THE COUNTER MEDICATION Place 1 application into both eyes daily. 0.25 inch Ribbon: Apply to left and right eye/ conjunctival sac.   polyethylene glycol (MIRALAX / GLYCOLAX) 17 g packet Take 17 g by mouth daily.   polyvinyl alcohol (LIQUIFILM TEARS) 1.4 % ophthalmic solution Place 1 drop into both eyes in the morning, at noon, in the evening, and at bedtime.   rOPINIRole (REQUIP) 1 MG tablet TAKE 1 TABLET EACH EVENING.   sennosides-docusate sodium (SENOKOT-S) 8.6-50 MG tablet Take 2 tablets by mouth daily.   zinc oxide 20 % ointment Apply 1 application topically as needed for irritation. Special Instructions: To buttocks after every incontinent episode and as needed for redness. May keep at bedside.   [DISCONTINUED] amLODipine (NORVASC) 5 MG tablet Take 2.5 mg by mouth daily. (Patient not taking: Reported on 11/20/2021)   No facility-administered encounter medications on file as of 11/20/2021.    Review of Systems  Constitutional:  Negative for activity change, appetite change, chills, fatigue and fever.  HENT:  Positive for hearing loss. Negative for congestion and trouble swallowing.   Eyes:  Negative for visual disturbance.  Respiratory:  Negative for cough, shortness of breath and wheezing.   Cardiovascular:  Positive for leg swelling. Negative for chest pain.  Gastrointestinal:  Positive for constipation. Negative for abdominal distention, abdominal pain, diarrhea and nausea.  Genitourinary:  Positive for dysuria and frequency. Negative for hematuria.  Musculoskeletal:  Positive for arthralgias, gait problem, joint swelling and  myalgias.  Skin:  Negative for wound.  Neurological:  Positive for weakness. Negative for dizziness and headaches.  Psychiatric/Behavioral:  Positive for dysphoric mood. Negative for confusion and sleep disturbance. The patient is not nervous/anxious.    Immunization History  Administered Date(s) Administered   Influenza, High Dose Seasonal PF 07/10/2017, 07/15/2019, 07/05/2020, 07/25/2021   Influenza,inj,Quad PF,6+ Mos 07/03/2018   Influenza-Unspecified 07/04/2015, 07/10/2017   Moderna SARS-COV2 Booster Vaccination 03/08/2021   Moderna Sars-Covid-2 Vaccination 10/05/2019, 11/02/2019, 08/15/2020, 06/21/2021   PFIZER(Purple Top)SARS-COV-2 Vaccination 06/21/2021   Pneumococcal Conjugate-13 08/18/2014   Pneumococcal Polysaccharide-23 07/26/2010   Tdap 11/12/2018   Pertinent  Health Maintenance Due  Topic Date Due   INFLUENZA VACCINE  Completed   DEXA SCAN  Discontinued   Fall Risk 04/15/2019 07/15/2019 07/23/2019 08/12/2019 09/29/2021  Falls in the past year? 0 0 0 0 1  Was there an injury with Fall? 0 - 0 - 0  Fall Risk Category Calculator 0 - 0 - 1  Fall Risk Category Low - Low - Low  Patient Fall Risk Level Low fall risk - - - -  Patient at Risk for  Falls Due to - - - - History of fall(s);Impaired balance/gait;Orthopedic patient  Fall risk Follow up - - - - Falls evaluation completed;Education provided;Falls prevention discussed   Functional Status Survey:    Vitals:   11/20/21 1542  BP: 136/72  Pulse: 79  Resp: 20  Temp: (!) 97.3 F (36.3 C)  SpO2: 92%  Weight: 123 lb 9.6 oz (56.1 kg)  Height: 5' (1.524 m)   Body mass index is 24.14 kg/m. Physical Exam Vitals reviewed.  Constitutional:      General: She is not in acute distress. HENT:     Head: Normocephalic.  Eyes:     General:        Right eye: No discharge.        Left eye: No discharge.  Cardiovascular:     Rate and Rhythm: Normal rate and regular rhythm.     Pulses: Normal pulses.     Heart sounds:  Normal heart sounds.  Pulmonary:     Effort: Pulmonary effort is normal. No respiratory distress.     Breath sounds: Normal breath sounds. No wheezing.  Abdominal:     General: Bowel sounds are normal. There is no distension.     Palpations: Abdomen is soft.     Tenderness: There is no abdominal tenderness.  Musculoskeletal:     Cervical back: Neck supple.     Right lower leg: Edema present.     Comments: Right foot drop, non pitting edema  Skin:    General: Skin is warm and dry.     Capillary Refill: Capillary refill takes less than 2 seconds.     Findings: Erythema present.     Comments: Toes to right foot slightly mottled, extremity cool/ nontender to touch, no sign of injury, deformity or skin breakdown, right great toe slightly red in color  Neurological:     General: No focal deficit present.     Mental Status: She is alert and oriented to person, place, and time.     Motor: Weakness present.     Gait: Gait abnormal.     Comments: PWC  Psychiatric:        Mood and Affect: Mood normal.        Behavior: Behavior normal.    Labs reviewed: Recent Labs    04/15/21 0000 11/02/21 0000  NA 139 139  K 3.9 4.2  CL 104 103  CO2 29*  --   BUN 26* 29*  CREATININE 0.8 0.9  CALCIUM 9.0 9.5   Recent Labs    04/15/21 0000 11/02/21 0000  AST 11* 11*  ALT 6* 8  ALKPHOS 58 65  ALBUMIN 3.1* 3.6   Recent Labs    04/15/21 0000 11/02/21 0000  WBC 8.0 6.8  NEUTROABS 4,032.00 3,012.00  HGB 11.3* 11.9*  HCT 34* 36  PLT 196 215   Lab Results  Component Value Date   TSH 2.01 11/02/2021   No results found for: HGBA1C Lab Results  Component Value Date   CHOL 175 07/23/2019   HDL 54 07/23/2019   LDLCALC 105 (H) 07/23/2019   TRIG 74 07/23/2019   CHOLHDL 3.2 07/23/2019    Significant Diagnostic Results in last 30 days:  No results found.  Assessment/Plan: 1. PVD (peripheral vascular disease) ([place]) - non pitting edema to right ankle, toes slightly mottled, cool to  touch, non tender - cbc/diff 9.2, uric acid 4.4 11/20/2021 - suspect swelling causing increased pain - start furosemide 20 mg po daily x 2  days - cont to elevate legs 2-4 hours daily - limit sodium in diet  2. Essential hypertension - controlled - cont amlodipine and Norvasc  3. Primary osteoarthritis involving multiple joints - cont scheduled tylenol  4. Dysuria - ongoing, improved with increased water intake - cont amitriptyline  5. RLS (restless legs syndrome) - cont Requip  6. Weight loss - cont monthly weights  7. Gait abnormality - ambulates well with PWC - cont skilled nursing care  8. Slow transit constipation - LBM 02/20, abdomen soft - cont senna and miralax  9. Dry eyes, bilateral - increased crusts in AM - cont artificial tears and Genteal ointment qhs - scheduled eye exam this week    Family/ staff Communication: plan discussed with patient and nurse  Labs/tests ordered:  cbc/diff, uric acid

## 2021-11-23 ENCOUNTER — Encounter: Payer: Self-pay | Admitting: Internal Medicine

## 2021-11-23 ENCOUNTER — Non-Acute Institutional Stay (SKILLED_NURSING_FACILITY): Payer: PPO | Admitting: Internal Medicine

## 2021-11-23 DIAGNOSIS — L03031 Cellulitis of right toe: Secondary | ICD-10-CM | POA: Diagnosis not present

## 2021-11-23 DIAGNOSIS — H10413 Chronic giant papillary conjunctivitis, bilateral: Secondary | ICD-10-CM | POA: Diagnosis not present

## 2021-11-23 DIAGNOSIS — H02105 Unspecified ectropion of left lower eyelid: Secondary | ICD-10-CM | POA: Diagnosis not present

## 2021-11-23 DIAGNOSIS — M79671 Pain in right foot: Secondary | ICD-10-CM | POA: Diagnosis not present

## 2021-11-23 DIAGNOSIS — R300 Dysuria: Secondary | ICD-10-CM

## 2021-11-23 DIAGNOSIS — I10 Essential (primary) hypertension: Secondary | ICD-10-CM | POA: Diagnosis not present

## 2021-11-23 DIAGNOSIS — M159 Polyosteoarthritis, unspecified: Secondary | ICD-10-CM | POA: Diagnosis not present

## 2021-11-23 DIAGNOSIS — T148XXA Other injury of unspecified body region, initial encounter: Secondary | ICD-10-CM

## 2021-11-23 DIAGNOSIS — G2581 Restless legs syndrome: Secondary | ICD-10-CM | POA: Diagnosis not present

## 2021-11-23 DIAGNOSIS — S9031XA Contusion of right foot, initial encounter: Secondary | ICD-10-CM | POA: Diagnosis not present

## 2021-11-23 DIAGNOSIS — R269 Unspecified abnormalities of gait and mobility: Secondary | ICD-10-CM | POA: Diagnosis not present

## 2021-11-23 NOTE — Progress Notes (Signed)
Location:   [facility] Room Number: 1 Place of Service:  SNF [phone]) Provider:  Veleta Miners MD  Mast, Man X, NP  Patient Care Team: Mast, Man X, NP as PCP - General (Internal Medicine) Wardell Honour, MD as Attending Physician (Family Medicine)  Extended Emergency Contact Information Primary Emergency Contact: [facility] Address: [place] [address] [place] Phone: [phone] Mobile Phone: [phone] Relation: Son Secondary Emergency Contact: [facility] Address: [address]          [place], [place] [address] [place] Phone: [phone] Relation: Son  Code Status:  DNR Palliative Care Managed Care Goals of care: Advanced Directive information Advanced Directives 11/23/2021  Does Patient Have a Medical Advance Directive? Yes  Type of Advance Directive Out of facility DNR (pink MOST or yellow form);Living will;Healthcare Power of Attorney  Does patient want to make changes to medical advance directive? No - Patient declined  Copy of [place] in Chart? Yes - validated most recent copy scanned in chart (See row information)  Would patient like information on creating a medical advance directive? -  Pre-existing out of facility DNR order (yellow form or pink MOST form) Yellow form placed in chart (order not valid for inpatient use);Pink MOST form placed in chart (order not valid for inpatient use)     Chief Complaint  Patient presents with   Medical Management of Chronic Issues   Quality Metric Gaps    Shingrix Per [facility]    HPI:  Pt is a 88 y.o. female seen today for medical management of chronic diseases.    Patient has h/o Hypertension, Anxiety, Restless leg Syndrome, Insomnia, Arthritis and Posterior Tendon Dysfunction with right foot Valgus in Right Knee with inability to Ambulate  Today acute issue Right Great toe is red and tender. She  also has a bruise in her other Toes and top of her Foot.  No Injury  Uric Acid level checked was 4.6 Urinary Issues Continues to have Pain when she micturates She feels like her bladder is full but it takes forever for her to empty her bladder Her Urine Culture is negative Failed Macrobid, Flomax Does not want to bother with Uroilogy Dr Sabra Heck her POA is talked to them and had decided on Elavil  It doesn't seem to help  Poor Appetite but weight is stable  No new Nursing issues.  No Falls Wt Readings from Last 3 Encounters:  11/23/21 123 lb 9.6 oz (56.1 kg)  11/20/21 123 lb 9.6 oz (56.1 kg)  11/13/21 123 lb 9.6 oz (56.1 kg)     Past Medical History:  Diagnosis Date   Anemia    Hypertension    OA (osteoarthritis)    Osteoporosis    Renal cyst    Past Surgical History:  Procedure Laterality Date   APPENDECTOMY     BLADDER SUSPENSION     CATARACT EXTRACTION     CESAREAN SECTION     REPLACEMENT TOTAL KNEE  1995   TONSILLECTOMY      No Known Allergies  Allergies as of 11/23/2021   No Known Allergies      Medication List        Accurate as of November 23, 2021 11:24 AM. If you have any questions, ask your nurse or doctor.          acetaminophen 325 MG tablet Commonly known  as: TYLENOL Take 650 mg by mouth every 4 (four) hours as needed.   acetaminophen 325 MG tablet Commonly known as: Tylenol Take 2 tablets (650 mg total) by mouth at bedtime.   amitriptyline 10 MG tablet Commonly known as: ELAVIL Take 10 mg by mouth at bedtime.   aspirin EC 81 MG tablet Take 81 mg by mouth in the morning. Mondays and Thursdays   Biotin 5000 MCG Tabs Take 5,000 mcg by mouth daily.   bisacodyl 10 MG suppository Commonly known as: DULCOLAX Place 10 mg rectally every other day.   furosemide 20 MG tablet Commonly known as: LASIX Take 20 mg by mouth daily.   GenTeal 0.25-0.3 % Gel Generic drug: Carboxymethylcell-Hypromellose Apply to eye daily.   magnesium  hydroxide 400 MG/5ML suspension Commonly known as: MILK OF MAGNESIA Take 30 mLs by mouth daily as needed for mild constipation.   metoprolol tartrate 50 MG tablet Commonly known as: LOPRESSOR TAKE 1 TABLET BY MOUTH TWICE DAILY.   multivitamin-lutein Caps capsule Take 1 capsule by mouth 2 (two) times daily.   OVER THE COUNTER MEDICATION Place 1 application into both eyes daily. 0.25 inch Ribbon: Apply to left and right eye/ conjunctival sac.   polyethylene glycol 17 g packet Commonly known as: MIRALAX / GLYCOLAX Take 17 g by mouth daily.   polyvinyl alcohol 1.4 % ophthalmic solution Commonly known as: LIQUIFILM TEARS Place 1 drop into both eyes in the morning, at noon, in the evening, and at bedtime.   rOPINIRole 1 MG tablet Commonly known as: REQUIP TAKE 1 TABLET EACH EVENING.   sennosides-docusate sodium 8.6-50 MG tablet Commonly known as: SENOKOT-S Take 2 tablets by mouth daily.   Vitamin D 50 MCG (2000 UT) Caps Take 2,000 Units by mouth daily.   zinc oxide 20 % ointment Apply 1 application topically as needed for irritation. Special Instructions: To buttocks after every incontinent episode and as needed for redness. May keep at bedside.        Review of Systems  Constitutional:  Positive for appetite change. Negative for activity change.  HENT: Negative.    Respiratory:  Negative for cough and shortness of breath.   Cardiovascular:  Negative for leg swelling.  Gastrointestinal:  Negative for constipation.  Genitourinary: Negative.   Musculoskeletal:  Positive for gait problem. Negative for arthralgias and myalgias.  Skin: Negative.   Neurological:  Negative for dizziness and weakness.  Psychiatric/Behavioral:  Negative for confusion, dysphoric mood and sleep disturbance.    Immunization History  Administered Date(s) Administered   Influenza, High Dose Seasonal PF 07/10/2017, 07/15/2019, 07/05/2020, 07/25/2021   Influenza,inj,Quad PF,6+ Mos 07/03/2018    Influenza-Unspecified 07/04/2015, 07/10/2017   Moderna SARS-COV2 Booster Vaccination 03/08/2021   Moderna Sars-Covid-2 Vaccination 10/05/2019, 11/02/2019, 08/15/2020, 06/21/2021   PFIZER(Purple Top)SARS-COV-2 Vaccination 06/21/2021   Pneumococcal Conjugate-13 08/18/2014   Pneumococcal Polysaccharide-23 07/26/2010   Tdap 11/12/2018   Pertinent  Health Maintenance Due  Topic Date Due   INFLUENZA VACCINE  Completed   DEXA SCAN  Discontinued   Fall Risk 04/15/2019 07/15/2019 07/23/2019 08/12/2019 09/29/2021  Falls in the past year? 0 0 0 0 1  Was there an injury with Fall? 0 - 0 - 0  Fall Risk Category Calculator 0 - 0 - 1  Fall Risk Category Low - Low - Low  Patient Fall Risk Level Low fall risk - - - -  Patient at Risk for Falls Due to - - - - History of fall(s);Impaired balance/gait;Orthopedic patient  Fall risk Follow up - - - -  Falls evaluation completed;Education provided;Falls prevention discussed   Functional Status Survey:    Vitals:   11/23/21 1102  BP: 138/68  Pulse: 76  Resp: 18  Temp: (!) 97.3 F (36.3 C)  SpO2: 94%  Weight: 123 lb 9.6 oz (56.1 kg)  Height: 5' (1.524 m)   Body mass index is 24.14 kg/m. Physical Exam Vitals reviewed.  Constitutional:      Appearance: Normal appearance.  HENT:     Head: Normocephalic.     Nose: Nose normal.     Mouth/Throat:     Mouth: Mucous membranes are moist.     Pharynx: Oropharynx is clear.  Eyes:     Pupils: Pupils are equal, round, and reactive to light.  Cardiovascular:     Rate and Rhythm: Normal rate and regular rhythm.     Pulses: Normal pulses.     Heart sounds: Normal heart sounds. No murmur heard. Pulmonary:     Effort: Pulmonary effort is normal.     Breath sounds: Normal breath sounds.  Abdominal:     General: Abdomen is flat. Bowel sounds are normal.     Palpations: Abdomen is soft.  Musculoskeletal:        General: No swelling.     Cervical back: Neck supple.  Skin:    General: Skin is warm.      Comments: Right Great Toe is Red and tender. No tenderness when I move the joint Also can see Bruise in all her other Toes and Top of the foot Mild Edema  Neurological:     Mental Status: She is alert and oriented to person, place, and time.  Psychiatric:        Mood and Affect: Mood normal.        Thought Content: Thought content normal.    Labs reviewed: Recent Labs    04/15/21 0000 11/02/21 0000  NA 139 139  K 3.9 4.2  CL 104 103  CO2 29*  --   BUN 26* 29*  CREATININE 0.8 0.9  CALCIUM 9.0 9.5   Recent Labs    04/15/21 0000 11/02/21 0000  AST 11* 11*  ALT 6* 8  ALKPHOS 58 65  ALBUMIN 3.1* 3.6   Recent Labs    04/15/21 0000 11/02/21 0000  WBC 8.0 6.8  NEUTROABS 4,032.00 3,012.00  HGB 11.3* 11.9*  HCT 34* 36  PLT 196 215   Lab Results  Component Value Date   TSH 2.01 11/02/2021   No results found for: HGBA1C Lab Results  Component Value Date   CHOL 175 07/23/2019   HDL 54 07/23/2019   LDLCALC 105 (H) 07/23/2019   TRIG 74 07/23/2019   CHOLHDL 3.2 07/23/2019    Significant Diagnostic Results in last 30 days:  No results found.  Assessment/Plan 1. Cellulitis of toe of right foot Doxycyline 100 mg BID for 7 days  2. Bruise Check Xray  Xray came negative for any fracture  3. Essential hypertension On Lopressor  4. Primary osteoarthritis involving multiple joints Tylenol  5. Dysuria Continues to be Issue Elavil low dose at night  Doesn't seem to help Multiple cultures so far negative Failed Flomax   6. RLS (restless legs syndrome) Requip  7. Gait abnormality Uses Power Chair    Family/ staff Communication:   Labs/tests ordered:

## 2021-11-24 ENCOUNTER — Non-Acute Institutional Stay (SKILLED_NURSING_FACILITY): Payer: PPO | Admitting: Orthopedic Surgery

## 2021-11-24 ENCOUNTER — Encounter: Payer: Self-pay | Admitting: Orthopedic Surgery

## 2021-11-24 DIAGNOSIS — M79671 Pain in right foot: Secondary | ICD-10-CM

## 2021-11-24 DIAGNOSIS — L03031 Cellulitis of right toe: Secondary | ICD-10-CM | POA: Diagnosis not present

## 2021-11-24 DIAGNOSIS — K5901 Slow transit constipation: Secondary | ICD-10-CM

## 2021-11-24 DIAGNOSIS — G2581 Restless legs syndrome: Secondary | ICD-10-CM | POA: Diagnosis not present

## 2021-11-24 DIAGNOSIS — R269 Unspecified abnormalities of gait and mobility: Secondary | ICD-10-CM | POA: Diagnosis not present

## 2021-11-24 NOTE — Progress Notes (Signed)
Location:  [facility] Room Number: N01/A Place of Service:  SNF (31) Provider: Yvonna Alanis, NP  Patient Care Team: Mast, Man X, NP as PCP - General (Internal Medicine) Wardell Honour, MD as Attending Physician (Family Medicine)  Extended Emergency Contact Information Primary Emergency Contact: [facility] Address: [place] [address] [place] Phone: [phone] Mobile Phone: [phone] Relation: Son Secondary Emergency Contact: [facility] Address: [address]          [place], [place] [address] [place] Phone: [phone] Relation: Son  Code Status:  DNR Goals of care: Advanced Directive information Advanced Directives 11/24/2021  Does Patient Have a Medical Advance Directive? Yes  Type of Advance Directive Out of facility DNR (pink MOST or yellow form);Living will;Healthcare Power of Attorney  Does patient want to make changes to medical advance directive? No - Patient declined  Copy of [place] in Chart? Yes - validated most recent copy scanned in chart (See row information)  Would patient like information on creating a medical advance directive? -  Pre-existing out of facility DNR order (yellow form or pink MOST form) Yellow form placed in chart (order not valid for inpatient use);Pink MOST form placed in chart (order not valid for inpatient use)     Chief Complaint  Patient presents with   Acute Visit    Right foot pain    HPI:  Pt is a 88 y.o. female seen today for an acute visit for ongoing right foot pain.   Increased right foot pain x 2 weeks. 02/20 wbc 9.2, uric acid level 4.4. Increased swelling initially thought to be cause of pain. She was given furosemide 20 mg po daily x 2 days. Swelling improved, but she continues to have pain. 02/23 she was seen by Dr. Lyndel Safe, right foot xray 02/23 negative for fracture or dislocation, soft tissue swelling  noted. She was  diagnosed with cellulitis of right toe. She was started on doxycycline 100 mg bid x 7 days.   Today, she reports having increased pain to right foot at night. Describes pain as intermittent shooting/burning.She is having trouble sleeping throughout the night. She is given Requip and tylenol 650 mg qhs. She is asking for increased pain medication today.   No recent falls or injuries. Ambulates with PWC.    Past Medical History:  Diagnosis Date   Anemia    Hypertension    OA (osteoarthritis)    Osteoporosis    Renal cyst    Past Surgical History:  Procedure Laterality Date   APPENDECTOMY     BLADDER SUSPENSION     CATARACT EXTRACTION     CESAREAN SECTION     REPLACEMENT TOTAL KNEE  1995   TONSILLECTOMY      No Known Allergies  Outpatient Encounter Medications as of 11/24/2021  Medication Sig   acetaminophen (TYLENOL) 325 MG tablet Take 650 mg by mouth every 4 (four) hours as needed.   acetaminophen (TYLENOL) 325 MG tablet Take 2 tablets (650 mg total) by mouth at bedtime.   amitriptyline (ELAVIL) 10 MG tablet Take 10 mg by mouth at bedtime.   aspirin EC 81 MG tablet Take 81 mg by mouth in the morning. Mondays and Thursdays   Biotin 5000 MCG TABS Take 5,000 mcg by mouth daily.    bisacodyl (DULCOLAX) 10 MG suppository Place 10 mg rectally every other day.  Carboxymethylcell-Hypromellose (GENTEAL) 0.25-0.3 % GEL Apply to eye daily.   Cholecalciferol (VITAMIN D) 50 MCG (2000 UT) CAPS Take 2,000 Units by mouth daily.    doxycycline (VIBRAMYCIN) 100 MG capsule Take 100 mg by mouth 2 (two) times daily.   magnesium hydroxide (MILK OF MAGNESIA) 400 MG/5ML suspension Take 30 mLs by mouth daily as needed for mild constipation.   metoprolol tartrate (LOPRESSOR) 50 MG tablet TAKE 1 TABLET BY MOUTH TWICE DAILY.   multivitamin-lutein (OCUVITE-LUTEIN) CAPS capsule Take 1 capsule by mouth 2 (two) times daily.    neomycin-polymyxin-dexamethasone (MAXITROL) 0.1 % ophthalmic  suspension 1 drop 3 (three) times daily.   OVER THE COUNTER MEDICATION Place 1 application into both eyes daily. 0.25 inch Ribbon: Apply to left and right eye/ conjunctival sac.   polyethylene glycol (MIRALAX / GLYCOLAX) 17 g packet Take 17 g by mouth daily.   polyvinyl alcohol (LIQUIFILM TEARS) 1.4 % ophthalmic solution Place 1 drop into both eyes in the morning, at noon, in the evening, and at bedtime.   rOPINIRole (REQUIP) 1 MG tablet TAKE 1 TABLET EACH EVENING.   sennosides-docusate sodium (SENOKOT-S) 8.6-50 MG tablet Take 2 tablets by mouth daily.   zinc oxide 20 % ointment Apply 1 application topically as needed for irritation. Special Instructions: To buttocks after every incontinent episode and as needed for redness. May keep at bedside.   No facility-administered encounter medications on file as of 11/24/2021.    Review of Systems  Constitutional:  Negative for chills, fatigue and fever.  HENT:  Positive for hearing loss. Negative for trouble swallowing.   Eyes:  Negative for visual disturbance.  Respiratory:  Negative for cough, shortness of breath and wheezing.   Cardiovascular:  Negative for chest pain and leg swelling.  Gastrointestinal:  Positive for constipation. Negative for abdominal distention, abdominal pain, diarrhea and nausea.  Genitourinary:  Positive for dysuria and frequency.  Musculoskeletal:  Positive for arthralgias, back pain and gait problem.  Skin:  Positive for color change.  Neurological:  Positive for weakness. Negative for dizziness and headaches.  Psychiatric/Behavioral:  Positive for dysphoric mood and sleep disturbance. The patient is not nervous/anxious.    Immunization History  Administered Date(s) Administered   Influenza, High Dose Seasonal PF 07/10/2017, 07/15/2019, 07/05/2020, 07/25/2021   Influenza,inj,Quad PF,6+ Mos 07/03/2018   Influenza-Unspecified 07/04/2015, 07/10/2017   Moderna SARS-COV2 Booster Vaccination 03/08/2021   Moderna  Sars-Covid-2 Vaccination 10/05/2019, 11/02/2019, 08/15/2020, 06/21/2021   PFIZER(Purple Top)SARS-COV-2 Vaccination 06/21/2021   Pneumococcal Conjugate-13 08/18/2014   Pneumococcal Polysaccharide-23 07/26/2010   Tdap 11/12/2018   Pertinent  Health Maintenance Due  Topic Date Due   INFLUENZA VACCINE  Completed   DEXA SCAN  Discontinued   Fall Risk 04/15/2019 07/15/2019 07/23/2019 08/12/2019 09/29/2021  Falls in the past year? 0 0 0 0 1  Was there an injury with Fall? 0 - 0 - 0  Fall Risk Category Calculator 0 - 0 - 1  Fall Risk Category Low - Low - Low  Patient Fall Risk Level Low fall risk - - - -  Patient at Risk for Falls Due to - - - - History of fall(s);Impaired balance/gait;Orthopedic patient  Fall risk Follow up - - - - Falls evaluation completed;Education provided;Falls prevention discussed   Functional Status Survey:    Vitals:   11/24/21 1318  BP: 138/68  Pulse: 76  Resp: 18  Temp: (!) 96.9 F (36.1 C)  SpO2: 91%  Weight: 123 lb 9.6 oz (56.1 kg)  Height: 5' (1.524 m)  Body mass index is 24.14 kg/m. Physical Exam Vitals reviewed.  Constitutional:      General: She is not in acute distress. HENT:     Head: Normocephalic.  Eyes:     General:        Right eye: No discharge.        Left eye: No discharge.  Neck:     Vascular: No carotid bruit.  Cardiovascular:     Rate and Rhythm: Normal rate and regular rhythm.     Pulses: Normal pulses.     Heart sounds: Normal heart sounds.  Pulmonary:     Effort: Pulmonary effort is normal. No respiratory distress.     Breath sounds: Normal breath sounds. No wheezing.  Abdominal:     General: Bowel sounds are normal. There is no distension.     Palpations: Abdomen is soft.     Tenderness: There is no abdominal tenderness.  Musculoskeletal:     Cervical back: Neck supple.     Right lower leg: Edema present.     Left lower leg: Edema present.     Comments: Non- pitting, right foot drop  Lymphadenopathy:      Cervical: No cervical adenopathy.  Skin:    General: Skin is warm and dry.     Capillary Refill: Capillary refill takes less than 2 seconds.     Findings: Erythema present.     Comments: Right great toe with erythema, toes slightly mottled, no skin breakdown noted  Neurological:     General: No focal deficit present.     Mental Status: She is alert. Mental status is at baseline.     Motor: Weakness present.     Gait: Gait abnormal.     Comments: PWC  Psychiatric:        Mood and Affect: Mood normal.        Behavior: Behavior normal.    Labs reviewed: Recent Labs    04/15/21 0000 11/02/21 0000  NA 139 139  K 3.9 4.2  CL 104 103  CO2 29*  --   BUN 26* 29*  CREATININE 0.8 0.9  CALCIUM 9.0 9.5   Recent Labs    04/15/21 0000 11/02/21 0000  AST 11* 11*  ALT 6* 8  ALKPHOS 58 65  ALBUMIN 3.1* 3.6   Recent Labs    04/15/21 0000 11/02/21 0000 11/20/21 0000  WBC 8.0 6.8 9.2  NEUTROABS 4,032.00 3,012.00 5,373.00  HGB 11.3* 11.9* 12.1  HCT 34* 36 36  PLT 196 215 239   Lab Results  Component Value Date   TSH 2.01 11/02/2021   No results found for: HGBA1C Lab Results  Component Value Date   CHOL 175 07/23/2019   HDL 54 07/23/2019   LDLCALC 105 (H) 07/23/2019   TRIG 74 07/23/2019   CHOLHDL 3.2 07/23/2019    Significant Diagnostic Results in last 30 days:  No results found.  Assessment/Plan 1. Right foot pain - 02/23 right foot xray negative for fracture or dislocation, soft tissue swelling noted - suspect due to recent cellulitis - will hold tylenol 650 mg qhs - start Tramadol 50 mg po qhs x 7 days, then resume scheduled tylenol  2. Cellulitis of toe of right foot - cont doxycycline 100 mg po bid x 7 days  3. RLS (restless legs syndrome) - cont Requip  4. Gait abnormality - ambulates well with PWC - cont skilled nursing care  5. Slow transit constipation - LBM 02/24, abdomen soft - cont senna  and miralax    Family/ staff Communication: plan  discussed with patient and nurse  Labs/tests ordered:  none

## 2021-11-30 ENCOUNTER — Non-Acute Institutional Stay (SKILLED_NURSING_FACILITY): Payer: PPO | Admitting: Internal Medicine

## 2021-11-30 ENCOUNTER — Encounter: Payer: Self-pay | Admitting: Internal Medicine

## 2021-11-30 DIAGNOSIS — C44622 Squamous cell carcinoma of skin of right upper limb, including shoulder: Secondary | ICD-10-CM | POA: Diagnosis not present

## 2021-11-30 DIAGNOSIS — T148XXA Other injury of unspecified body region, initial encounter: Secondary | ICD-10-CM | POA: Diagnosis not present

## 2021-11-30 DIAGNOSIS — L821 Other seborrheic keratosis: Secondary | ICD-10-CM | POA: Diagnosis not present

## 2021-11-30 DIAGNOSIS — L03031 Cellulitis of right toe: Secondary | ICD-10-CM | POA: Diagnosis not present

## 2021-11-30 DIAGNOSIS — M79671 Pain in right foot: Secondary | ICD-10-CM | POA: Diagnosis not present

## 2021-11-30 DIAGNOSIS — R269 Unspecified abnormalities of gait and mobility: Secondary | ICD-10-CM | POA: Diagnosis not present

## 2021-11-30 DIAGNOSIS — Z85828 Personal history of other malignant neoplasm of skin: Secondary | ICD-10-CM | POA: Diagnosis not present

## 2021-11-30 DIAGNOSIS — G2581 Restless legs syndrome: Secondary | ICD-10-CM

## 2021-11-30 DIAGNOSIS — L814 Other melanin hyperpigmentation: Secondary | ICD-10-CM | POA: Diagnosis not present

## 2021-11-30 NOTE — Progress Notes (Signed)
Location:   [place] Room Number: 1 Place of Service:  SNF (31) Provider:  Veleta Miners MD   Mast, Man X, NP  Patient Care Team: Mast, Man X, NP as PCP - General (Internal Medicine) Wardell Honour, MD as Attending Physician (Family Medicine)  Extended Emergency Contact Information Primary Emergency Contact: [facility] Address: [address] [place] [address] [place] Phone: [phone] Mobile Phone: [phone] Relation: Son Secondary Emergency Contact: [facility] Address: [address]          [place],  [address] [place] Phone: [phone] Relation: Son  Code Status:  DNR Palliative Care Managed Care Goals of care: Advanced Directive information Advanced Directives 11/30/2021  Does Patient Have a Medical Advance Directive? Yes  Type of Advance Directive Out of facility DNR (pink MOST or yellow form);Living will;Healthcare Power of Attorney  Does patient want to make changes to medical advance directive? No - Patient declined  Copy of [place] in Chart? Yes - validated most recent copy scanned in chart (See row information)  Would patient like information on creating a medical advance directive? -  Pre-existing out of facility DNR order (yellow form or pink MOST form) Yellow form placed in chart (order not valid for inpatient use);Pink MOST form placed in chart (order not valid for inpatient use)     Chief Complaint  Patient presents with   Acute Visit    HPI:  Pt is a 88 y.o. female seen today for an acute visit for Follow up of redness in her Leg ? Cellulitis  Patient has h/o Hypertension, Anxiety, Restless leg Syndrome, Insomnia, Arthritis and Posterior Tendon Dysfunction with right foot Valgus in Right Knee with inability to Ambulate  Seen for Cellulitits a week ago this was Follow up Dr Sabra Heck in the room also Her rash  over the top of the foot is  now resolved but her Great toe is till slighlty red at the bottom and she has the Bruise in her other toes Xray was negative Urice acid level less then 5 Says Tramadol is helping with pain   Past Medical History:  Diagnosis Date   Anemia    Hypertension    OA (osteoarthritis)    Osteoporosis    Renal cyst    Past Surgical History:  Procedure Laterality Date   APPENDECTOMY     BLADDER SUSPENSION     CATARACT EXTRACTION     CESAREAN SECTION     REPLACEMENT TOTAL KNEE  1995   TONSILLECTOMY      No Known Allergies  Allergies as of 11/30/2021   No Known Allergies      Medication List        Accurate as of November 30, 2021 11:55 AM. If you have any questions, ask your nurse or doctor.          STOP taking these medications    doxycycline 100 MG capsule Commonly known as: VIBRAMYCIN Stopped by: Virgie Dad, MD       TAKE these medications    acetaminophen 325 MG tablet Commonly known as: TYLENOL Take 650 mg by mouth every 4 (four) hours as needed.   acetaminophen 325 MG tablet Commonly known as: Tylenol Take 2 tablets (650 mg total) by mouth at bedtime.   amitriptyline 10 MG tablet Commonly known as: ELAVIL Take 10 mg by mouth at bedtime.  aspirin EC 81 MG tablet Take 81 mg by mouth in the morning. Mondays and Thursdays   Biotin 5000 MCG Tabs Take 5,000 mcg by mouth daily.   bisacodyl 10 MG suppository Commonly known as: DULCOLAX Place 10 mg rectally every other day.   GenTeal 0.25-0.3 % Gel Generic drug: Carboxymethylcell-Hypromellose Apply to eye daily.   magnesium hydroxide 400 MG/5ML suspension Commonly known as: MILK OF MAGNESIA Take 30 mLs by mouth daily as needed for mild constipation.   metoprolol tartrate 50 MG tablet Commonly known as: LOPRESSOR TAKE 1 TABLET BY MOUTH TWICE DAILY.   multivitamin-lutein Caps capsule Take 1 capsule by mouth 2 (two) times daily.   neomycin-polymyxin-dexamethasone 0.1 % ophthalmic  suspension Commonly known as: MAXITROL 1 drop 3 (three) times daily.   OcuSoft Eyelid Cleansing Pads Apply topically daily.   OVER THE COUNTER MEDICATION Place 1 application into both eyes daily. 0.25 inch Ribbon: Apply to left and right eye/ conjunctival sac.   polyethylene glycol 17 g packet Commonly known as: MIRALAX / GLYCOLAX Take 17 g by mouth daily.   polyvinyl alcohol 1.4 % ophthalmic solution Commonly known as: LIQUIFILM TEARS Place 1 drop into both eyes in the morning, at noon, in the evening, and at bedtime.   rOPINIRole 1 MG tablet Commonly known as: REQUIP TAKE 1 TABLET EACH EVENING.   sennosides-docusate sodium 8.6-50 MG tablet Commonly known as: SENOKOT-S Take 2 tablets by mouth daily.   traMADol 50 MG tablet Commonly known as: ULTRAM Take 50 mg by mouth daily at 6 (six) AM.   Vitamin D 50 MCG (2000 UT) Caps Take 2,000 Units by mouth daily.   zinc oxide 20 % ointment Apply 1 application topically as needed for irritation. Special Instructions: To buttocks after every incontinent episode and as needed for redness. May keep at bedside.        Review of Systems  Constitutional:  Negative for activity change and appetite change.  HENT: Negative.    Respiratory:  Negative for cough and shortness of breath.   Cardiovascular:  Negative for leg swelling.  Gastrointestinal:  Negative for constipation.  Genitourinary:  Positive for difficulty urinating and dysuria.  Musculoskeletal:  Positive for gait problem. Negative for arthralgias and myalgias.  Skin:  Positive for color change.  Neurological:  Negative for dizziness and weakness.  Psychiatric/Behavioral:  Negative for confusion, dysphoric mood and sleep disturbance.    Immunization History  Administered Date(s) Administered   Influenza, High Dose Seasonal PF 07/10/2017, 07/15/2019, 07/05/2020, 07/25/2021   Influenza,inj,Quad PF,6+ Mos 07/03/2018   Influenza-Unspecified 07/04/2015, 07/10/2017    Moderna SARS-COV2 Booster Vaccination 03/08/2021   Moderna Sars-Covid-2 Vaccination 10/05/2019, 11/02/2019, 08/15/2020, 06/21/2021   PFIZER(Purple Top)SARS-COV-2 Vaccination 06/21/2021   Pneumococcal Conjugate-13 08/18/2014   Pneumococcal Polysaccharide-23 07/26/2010   Tdap 11/12/2018   Pertinent  Health Maintenance Due  Topic Date Due   INFLUENZA VACCINE  Completed   DEXA SCAN  Discontinued   Fall Risk 04/15/2019 07/15/2019 07/23/2019 08/12/2019 09/29/2021  Falls in the past year? 0 0 0 0 1  Was there an injury with Fall? 0 - 0 - 0  Fall Risk Category Calculator 0 - 0 - 1  Fall Risk Category Low - Low - Low  Patient Fall Risk Level Low fall risk - - - -  Patient at Risk for Falls Due to - - - - History of fall(s);Impaired balance/gait;Orthopedic patient  Fall risk Follow up - - - - Falls evaluation completed;Education provided;Falls prevention discussed   Functional Status Survey:  Vitals:   11/30/21 1138  BP: 132/85  Pulse: 78  Resp: 20  Temp: (!) 97.1 F (36.2 C)  SpO2: 93%  Weight: 122 lb 9.6 oz (55.6 kg)  Height: 5' (1.524 m)   Body mass index is 23.94 kg/m. Physical Exam Vitals reviewed.  Constitutional:      Appearance: Normal appearance.  HENT:     Head: Normocephalic.     Nose: Nose normal.     Mouth/Throat:     Mouth: Mucous membranes are moist.     Pharynx: Oropharynx is clear.  Eyes:     Pupils: Pupils are equal, round, and reactive to light.  Cardiovascular:     Rate and Rhythm: Normal rate and regular rhythm.     Pulses: Normal pulses.     Heart sounds: Normal heart sounds. No murmur heard. Pulmonary:     Effort: Pulmonary effort is normal.     Breath sounds: Normal breath sounds.  Abdominal:     General: Abdomen is flat. Bowel sounds are normal.     Palpations: Abdomen is soft.  Musculoskeletal:        General: No swelling.     Cervical back: Neck supple.  Skin:    General: Skin is warm.     Comments: Cellulitis is resolved Does have  Bruise in other toes  Neurological:     General: No focal deficit present.     Mental Status: She is alert and oriented to person, place, and time.  Psychiatric:        Mood and Affect: Mood normal.        Thought Content: Thought content normal.    Labs reviewed: Recent Labs    04/15/21 0000 11/02/21 0000  NA 139 139  K 3.9 4.2  CL 104 103  CO2 29*  --   BUN 26* 29*  CREATININE 0.8 0.9  CALCIUM 9.0 9.5   Recent Labs    04/15/21 0000 11/02/21 0000  AST 11* 11*  ALT 6* 8  ALKPHOS 58 65  ALBUMIN 3.1* 3.6   Recent Labs    04/15/21 0000 11/02/21 0000 11/20/21 0000  WBC 8.0 6.8 9.2  NEUTROABS 4,032.00 3,012.00 5,373.00  HGB 11.3* 11.9* 12.1  HCT 34* 36 36  PLT 196 215 239   Lab Results  Component Value Date   TSH 2.01 11/02/2021   No results found for: HGBA1C Lab Results  Component Value Date   CHOL 175 07/23/2019   HDL 54 07/23/2019   LDLCALC 105 (H) 07/23/2019   TRIG 74 07/23/2019   CHOLHDL 3.2 07/23/2019    Significant Diagnostic Results in last 30 days:  No results found.  Assessment/Plan Cellulitis of toe of right foot Resolved No other signs of infection Will continue to monitor her Bruise Right foot pain Continue Tramadol at her request   Other issues Essential hypertension On Lopressor    Primary osteoarthritis involving multiple joints Tylenol    Dysuria Continues to be Issue Elavil low dose at night  Doesn't seem to help Multiple cultures so far negative Failed Flomax  Have not seen Urology due to her Age   RLS (restless legs syndrome) Requip   Gait abnormality Uses Power Chair    Family/ staff Communication:   Labs/tests ordered:

## 2021-12-04 ENCOUNTER — Other Ambulatory Visit: Payer: Self-pay | Admitting: Orthopedic Surgery

## 2021-12-04 DIAGNOSIS — L03031 Cellulitis of right toe: Secondary | ICD-10-CM

## 2021-12-04 MED ORDER — TRAMADOL HCL 50 MG PO TABS: 50.0000 mg | ORAL_TABLET | Freq: Every day | ORAL | 0 refills | Status: DC

## 2021-12-04 MED ORDER — TRAMADOL HCL 50 MG PO TABS: 50.0000 mg | ORAL_TABLET | Freq: Every day | ORAL | 0 refills | Status: AC

## 2021-12-14 DIAGNOSIS — M76819 Anterior tibial syndrome, unspecified leg: Secondary | ICD-10-CM | POA: Diagnosis not present

## 2021-12-14 DIAGNOSIS — Z9181 History of falling: Secondary | ICD-10-CM | POA: Diagnosis not present

## 2021-12-14 DIAGNOSIS — R293 Abnormal posture: Secondary | ICD-10-CM | POA: Diagnosis not present

## 2021-12-14 DIAGNOSIS — G2581 Restless legs syndrome: Secondary | ICD-10-CM | POA: Diagnosis not present

## 2021-12-14 DIAGNOSIS — M159 Polyosteoarthritis, unspecified: Secondary | ICD-10-CM | POA: Diagnosis not present

## 2021-12-14 DIAGNOSIS — Z993 Dependence on wheelchair: Secondary | ICD-10-CM | POA: Diagnosis not present

## 2021-12-15 DIAGNOSIS — Z993 Dependence on wheelchair: Secondary | ICD-10-CM | POA: Diagnosis not present

## 2021-12-15 DIAGNOSIS — R293 Abnormal posture: Secondary | ICD-10-CM | POA: Diagnosis not present

## 2021-12-15 DIAGNOSIS — M76819 Anterior tibial syndrome, unspecified leg: Secondary | ICD-10-CM | POA: Diagnosis not present

## 2021-12-15 DIAGNOSIS — Z9181 History of falling: Secondary | ICD-10-CM | POA: Diagnosis not present

## 2021-12-15 DIAGNOSIS — M159 Polyosteoarthritis, unspecified: Secondary | ICD-10-CM | POA: Diagnosis not present

## 2021-12-15 DIAGNOSIS — G2581 Restless legs syndrome: Secondary | ICD-10-CM | POA: Diagnosis not present

## 2021-12-22 ENCOUNTER — Encounter: Payer: Self-pay | Admitting: Orthopedic Surgery

## 2021-12-22 ENCOUNTER — Non-Acute Institutional Stay (SKILLED_NURSING_FACILITY): Payer: PPO | Admitting: Orthopedic Surgery

## 2021-12-22 DIAGNOSIS — M159 Polyosteoarthritis, unspecified: Secondary | ICD-10-CM

## 2021-12-22 DIAGNOSIS — R300 Dysuria: Secondary | ICD-10-CM

## 2021-12-22 DIAGNOSIS — G2581 Restless legs syndrome: Secondary | ICD-10-CM

## 2021-12-22 DIAGNOSIS — L03031 Cellulitis of right toe: Secondary | ICD-10-CM | POA: Diagnosis not present

## 2021-12-22 DIAGNOSIS — M79671 Pain in right foot: Secondary | ICD-10-CM | POA: Diagnosis not present

## 2021-12-22 DIAGNOSIS — R634 Abnormal weight loss: Secondary | ICD-10-CM

## 2021-12-22 DIAGNOSIS — R269 Unspecified abnormalities of gait and mobility: Secondary | ICD-10-CM

## 2021-12-22 DIAGNOSIS — K5901 Slow transit constipation: Secondary | ICD-10-CM

## 2021-12-22 DIAGNOSIS — I10 Essential (primary) hypertension: Secondary | ICD-10-CM | POA: Diagnosis not present

## 2021-12-22 DIAGNOSIS — H04123 Dry eye syndrome of bilateral lacrimal glands: Secondary | ICD-10-CM

## 2021-12-22 MED ORDER — METOLAZONE 2.5 MG PO TABS: 2.5000 mg | ORAL_TABLET | ORAL | 5 refills | Status: DC

## 2021-12-22 NOTE — Progress Notes (Signed)
?Location:  [facility] Room Number: N01/A ?Place of Service:  SNF (31) ?Provider: Yvonna Alanis, NP ? ?Patient Care Team: ?Mast, Man X, NP as PCP - General (Internal Medicine) ?Wardell Honour, MD as Attending Physician (Family Medicine) ? ?Extended Emergency Contact Information ?Primary Emergency Contact: Arambula,Steve ?Address: [address] ?         [place] [address] [place] ?Home Phone: [phone] ?Mobile Phone: [phone] ?Relation: Son ?Secondary Emergency Contact: [place],Garth ?Address: [address] ?         [place], [place] [address] [place] ?Home Phone: [phone] ?Relation: Son ? ?Code Status:  DNR ?Goals of care: Advanced Directive information ? ?  12/22/2021  ? 10:06 AM  ?Advanced Directives  ?Does Patient Have a Medical Advance Directive? Yes  ?Type of Advance Directive Out of facility DNR (pink MOST or yellow form);Living will;Healthcare Power of Attorney  ?Does patient want to make changes to medical advance directive? No - Patient declined  ?Copy of [place] in Chart? Yes - validated most recent copy scanned in chart (See row information)  ?Pre-existing out of facility DNR order (yellow form or pink MOST form) Yellow form placed in chart (order not valid for inpatient use);Pink MOST form placed in chart (order not valid for inpatient use)  ? ? ? ?Chief Complaint  ?Patient presents with  ? Medical Management of Chronic Issues  ?  Routine visit  ? Quality Metric Gaps  ?  Discuss the need for Shingrix vaccine, or post pone if patient refuses.   ? ? ?HPI:  ?Pt is a 88 y.o. female seen today for medical management of chronic diseases.   ? ?She continues to reside on the skilled nursing unit at [facility]. Past medical history includes: hypertension, PVD, osteoarthritis of multiple joints, CKD stage 3, posterior tendon dysfunction with right foot drop, constipation, restless leg syndrome and anxiety.  ? ?Right toe  cellulitis- onset 02/23, she was given doxycycline x 1 week, tramadol given for pain- unsuccessful due to nightmares, WBC 9.2, uric acid 4.4 11/20/2021 ?Right foot pain- continues to have increased pain, does not like leg wrappings due to pain,  xray right foot negative for acute fracture or dislocation, see above ?HTN- BUN/creat 29/0.9 11/02/2021, remains on amlodipine and metoprolol ?OA- reports right leg/shoulder/back pain, pain worse in the AM, improves throughout the day, remains on tylenol 650 po prn ?Dysuria- ongoing, increased symptoms when she does not drink water well, unsuccessful trial of Uribel 06/2021, last urine culture inconclusive- recollection recommended, Estrace trial unsuccessful- also gave her breast tenderness, not interested in urology consult, remains on amitriptyline ?RLS- she is able to sleep at night, requip qhs ?Weight loss/poor appetite-  see weights below, she dislikes the food served at [employer], refuses supplemental shakes ?Gait abnormality- uses power wheelchair due to right foot drop and right tibial tendon abnormality ?Constipation- LBM 03/24, remains on colace, senna and miralax prn ?Dry eyes- improved with ocusoft pads every night, remains on artificial tears and Genteal ointment ? ?No recent falls or injuries. Ambulates with [facility]WC.  ? ?Recent blood pressures: ? 03/21- 135/90 ? 03/14- 171/67 ? 03/07- 109/76 ? ?Recent weights: ? 03/01- 122.6 lbs ? 02/05- 123.6 lbs ? 01/04- 123.7 lbs ? ? ? ?Past Medical History:  ?Diagnosis Date  ? Anemia   ? Hypertension   ? OA (osteoarthritis)   ? Osteoporosis   ? Renal cyst   ? ?Past Surgical History:  ?  Procedure Laterality Date  ? APPENDECTOMY    ? BLADDER SUSPENSION    ? CATARACT EXTRACTION    ? CESAREAN SECTION    ? REPLACEMENT TOTAL KNEE  1995  ? TONSILLECTOMY    ? ? ?No Known Allergies ? ?Outpatient Encounter Medications as of 12/22/2021  ?Medication Sig  ? acetaminophen (TYLENOL) 325 MG tablet Take 650 mg by mouth every 4 (four) hours as  needed.  ? amitriptyline (ELAVIL) 10 MG tablet Take 10 mg by mouth at bedtime.  ? aspirin EC 81 MG tablet Take 81 mg by mouth in the morning. Mondays and Thursdays  ? Biotin 5000 MCG TABS Take 5,000 mcg by mouth daily.   ? bisacodyl (DULCOLAX) 10 MG suppository Place 10 mg rectally every other day.  ? Carboxymethylcell-Hypromellose (GENTEAL) 0.25-0.3 % GEL Apply to eye daily.  ? Cholecalciferol (VITAMIN D) 50 MCG (2000 UT) CAPS Take 2,000 Units by mouth daily.   ? Eyelid Cleansers (OCUSOFT EYELID CLEANSING) PADS Apply topically daily.  ? magnesium hydroxide (MILK OF MAGNESIA) 400 MG/5ML suspension Take 30 mLs by mouth daily as needed for mild constipation.  ? metoprolol tartrate (LOPRESSOR) 50 MG tablet TAKE 1 TABLET BY MOUTH TWICE DAILY.  ? multivitamin-lutein (OCUVITE-LUTEIN) CAPS capsule Take 1 capsule by mouth 2 (two) times daily.   ? OVER THE COUNTER MEDICATION Place 1 application into both eyes daily. 0.25 inch Ribbon: Apply to left and right eye/ conjunctival sac.  ? polyethylene glycol (MIRALAX / GLYCOLAX) 17 g packet Take 17 g by mouth daily.  ? polyvinyl alcohol (LIQUIFILM TEARS) 1.4 % ophthalmic solution Place 1 drop into both eyes in the morning, at noon, in the evening, and at bedtime.  ? rOPINIRole (REQUIP) 1 MG tablet TAKE 1 TABLET EACH EVENING.  ? sennosides-docusate sodium (SENOKOT-S) 8.6-50 MG tablet Take 2 tablets by mouth daily.  ? traMADol (ULTRAM) 50 MG tablet Take 50 mg by mouth daily. For 14 days then evaluate  ? zinc oxide 20 % ointment Apply 1 application topically as needed for irritation. Special Instructions: To buttocks after every incontinent episode and as needed for redness. May keep at bedside.  ? [DISCONTINUED] acetaminophen (TYLENOL) 325 MG tablet Take 2 tablets (650 mg total) by mouth at bedtime. (Patient not taking: Reported on 12/22/2021)  ? [DISCONTINUED] neomycin-polymyxin-dexamethasone (MAXITROL) 0.1 % ophthalmic suspension 1 drop 3 (three) times daily. (Patient not taking:  Reported on 12/22/2021)  ? ?No facility-administered encounter medications on file as of 12/22/2021.  ? ? ?Review of Systems  ?Constitutional:  Negative for activity change, appetite change, chills, fatigue and fever.  ?HENT:  Positive for hearing loss. Negative for congestion and trouble swallowing.   ?Eyes:  Negative for visual disturbance.  ?Respiratory:  Negative for cough, shortness of breath and wheezing.   ?Cardiovascular:  Positive for leg swelling. Negative for chest pain.  ?Gastrointestinal:  Positive for constipation. Negative for abdominal distention, abdominal pain, blood in stool, diarrhea, nausea and vomiting.  ?Genitourinary:  Negative for dysuria, frequency and hematuria.  ?Musculoskeletal:  Positive for arthralgias, gait problem, joint swelling and myalgias.  ?Skin:  Negative for wound.  ?Neurological:  Positive for weakness. Negative for dizziness and headaches.  ?Psychiatric/Behavioral:  Positive for confusion. Negative for dysphoric mood and sleep disturbance. The patient is not nervous/anxious.   ? ?Immunization History  ?Administered Date(s) Administered  ? Influenza, High Dose Seasonal PF 07/10/2017, 07/15/2019, 07/05/2020, 07/25/2021  ? Influenza,inj,Quad PF,6+ Mos 07/03/2018  ? Influenza-Unspecified 07/04/2015, 07/10/2017  ? Moderna SARS-COV2 Booster Vaccination 03/08/2021  ?  Moderna Sars-Covid-2 Vaccination 10/05/2019, 11/02/2019, 08/15/2020, 06/21/2021  ? PFIZER(Purple Top)SARS-COV-2 Vaccination 06/21/2021  ? Pneumococcal Conjugate-13 08/18/2014  ? Pneumococcal Polysaccharide-23 07/26/2010  ? Tdap 11/12/2018  ? ?Pertinent  Health Maintenance Due  ?Topic Date Due  ? INFLUENZA VACCINE  Completed  ? DEXA SCAN  Discontinued  ? ? ?  04/15/2019  ?  1:53 PM 07/15/2019  ?  2:32 PM 07/23/2019  ? 11:24 AM 08/12/2019  ?  2:51 PM 09/29/2021  ?  1:43 PM  ?Fall Risk  ?Falls in the past year? 0 0 0 0 1  ?Was there an injury with Fall? 0  0  0  ?Fall Risk Category Calculator 0  0  1  ?Fall Risk Category Low   Low  Low  ?Patient Fall Risk Level Low fall risk      ?Patient at Risk for Falls Due to     History of fall(s);Impaired balance/gait;Orthopedic patient  ?Fall risk Follow up     Falls evaluation complete

## 2021-12-25 DIAGNOSIS — R293 Abnormal posture: Secondary | ICD-10-CM | POA: Diagnosis not present

## 2021-12-25 DIAGNOSIS — M76819 Anterior tibial syndrome, unspecified leg: Secondary | ICD-10-CM | POA: Diagnosis not present

## 2021-12-25 DIAGNOSIS — Z9181 History of falling: Secondary | ICD-10-CM | POA: Diagnosis not present

## 2021-12-25 DIAGNOSIS — M159 Polyosteoarthritis, unspecified: Secondary | ICD-10-CM | POA: Diagnosis not present

## 2021-12-25 DIAGNOSIS — G2581 Restless legs syndrome: Secondary | ICD-10-CM | POA: Diagnosis not present

## 2021-12-25 DIAGNOSIS — Z993 Dependence on wheelchair: Secondary | ICD-10-CM | POA: Diagnosis not present

## 2022-01-04 DIAGNOSIS — L814 Other melanin hyperpigmentation: Secondary | ICD-10-CM | POA: Diagnosis not present

## 2022-01-04 DIAGNOSIS — L570 Actinic keratosis: Secondary | ICD-10-CM | POA: Diagnosis not present

## 2022-01-04 DIAGNOSIS — Z85828 Personal history of other malignant neoplasm of skin: Secondary | ICD-10-CM | POA: Diagnosis not present

## 2022-01-12 ENCOUNTER — Encounter: Payer: Self-pay | Admitting: Orthopedic Surgery

## 2022-01-12 ENCOUNTER — Non-Acute Institutional Stay (SKILLED_NURSING_FACILITY): Payer: PPO | Admitting: Orthopedic Surgery

## 2022-01-12 DIAGNOSIS — G2581 Restless legs syndrome: Secondary | ICD-10-CM | POA: Diagnosis not present

## 2022-01-12 DIAGNOSIS — I10 Essential (primary) hypertension: Secondary | ICD-10-CM

## 2022-01-12 DIAGNOSIS — M159 Polyosteoarthritis, unspecified: Secondary | ICD-10-CM | POA: Diagnosis not present

## 2022-01-12 DIAGNOSIS — M79671 Pain in right foot: Secondary | ICD-10-CM

## 2022-01-12 DIAGNOSIS — R269 Unspecified abnormalities of gait and mobility: Secondary | ICD-10-CM

## 2022-01-12 DIAGNOSIS — R300 Dysuria: Secondary | ICD-10-CM

## 2022-01-12 NOTE — Progress Notes (Signed)
?Location:  [facility] Room Number: N01/A ?Place of Service:  SNF (31) ?Provider: Yvonna Alanis, NP ? ?Patient Care Team: ?Mast, Man X, NP as PCP - General (Internal Medicine) ?Wardell Honour, MD as Attending Physician (Family Medicine) ? ?Extended Emergency Contact Information ?Primary Emergency Contact: Marsch,Steve ?Address: [address] ?         [place] [address] [place] ?Home Phone: [phone] ?Mobile Phone: [phone] ?Relation: Son ?Secondary Emergency Contact: Rekowski,Garth ?Address: [address] ?         [place], [place] [address] [place] ?Home Phone: [phone] ?Relation: Son ? ?Code Status:  DNR ?Goals of care: Advanced Directive information ? ?  01/12/2022  ?  9:57 AM  ?Advanced Directives  ?Does Patient Have a Medical Advance Directive? Yes  ?Type of Advance Directive Out of facility DNR (pink MOST or yellow form);Living will;Healthcare Power of Attorney  ?Does patient want to make changes to medical advance directive? No - Patient declined  ?Copy of [place] in Chart? Yes - validated most recent copy scanned in chart (See row information)  ?Pre-existing out of facility DNR order (yellow form or pink MOST form) Yellow form placed in chart (order not valid for inpatient use);Pink MOST form placed in chart (order not valid for inpatient use)  ? ? ? ?Chief Complaint  ?Patient presents with  ? Acute Visit  ?  Right foot pain  ? ? ?HPI:  ?Pt is a 88 y.o. female seen today for an acute visit for right foot pain and swelling.  ? ?Right foot pain- hx right foot drop, thought to be due to swelling, pain improved since starting metolazone, continues to have intermittent pain, does not like leg wrappings due to pain, she is not always sitting in recliner, xray right foot negative for acute fracture or dislocation, 02/23 treated with doxycycline x 7 days for right toe cellulitis, WBC 9.2/ uric acid 4.4 11/20/2021 ?HTN- BUN/creat  29/0.9 11/02/2021, remains on metoprolol ?OA- reports right leg/shoulder/back pain, pain worse in the AM, remains on tylenol prn ?Dysuria- ongoing, increased symptoms when she does not drink water well, unsuccessful trial of Uribel 06/2021, last urine culture inconclusive- recollection recommended, Estrace trial unsuccessful- also gave her breast tenderness, not interested in urology consult, remains on amitriptyline ?RLS- she is able to sleep at night, requip qhs ?Gait abnormality- uses power wheelchair, no recent injuries ? ?Past Medical History:  ?Diagnosis Date  ? Anemia   ? Hypertension   ? OA (osteoarthritis)   ? Osteoporosis   ? Renal cyst   ? ?Past Surgical History:  ?Procedure Laterality Date  ? APPENDECTOMY    ? BLADDER SUSPENSION    ? CATARACT EXTRACTION    ? CESAREAN SECTION    ? REPLACEMENT TOTAL KNEE  1995  ? TONSILLECTOMY    ? ? ?No Known Allergies ? ?Outpatient Encounter Medications as of 01/12/2022  ?Medication Sig  ? acetaminophen (TYLENOL) 325 MG tablet Take 650 mg by mouth every 4 (four) hours as needed.  ? acetaminophen (TYLENOL) 325 MG tablet Take 650 mg by mouth every evening.  ? amitriptyline (ELAVIL) 10 MG tablet Take 10 mg by mouth at bedtime.  ? aspirin EC 81 MG tablet Take 81 mg by mouth in the morning. Mondays and Thursdays  ? Biotin 5000 MCG TABS Take 5,000 mcg by mouth daily.   ? bisacodyl (DULCOLAX) 10 MG suppository Place 10 mg rectally every other day.  ?  Carboxymethylcell-Hypromellose (GENTEAL) 0.25-0.3 % GEL Apply to eye daily.  ? Cholecalciferol (VITAMIN D) 50 MCG (2000 UT) CAPS Take 2,000 Units by mouth daily.   ? Eyelid Cleansers (OCUSOFT EYELID CLEANSING) PADS Apply topically daily.  ? magnesium hydroxide (MILK OF MAGNESIA) 400 MG/5ML suspension Take 30 mLs by mouth daily as needed for mild constipation.  ? metolazone (ZAROXOLYN) 2.5 MG tablet Take 1 tablet (2.5 mg total) by mouth 2 (two) times a week. Give on Mondays and Thursdays  ? metoprolol tartrate (LOPRESSOR) 50 MG  tablet TAKE 1 TABLET BY MOUTH TWICE DAILY.  ? multivitamin-lutein (OCUVITE-LUTEIN) CAPS capsule Take 1 capsule by mouth 2 (two) times daily.   ? OVER THE COUNTER MEDICATION Place 1 application into both eyes daily. 0.25 inch Ribbon: Apply to left and right eye/ conjunctival sac.  ? polyethylene glycol (MIRALAX / GLYCOLAX) 17 g packet Take 17 g by mouth daily.  ? polyvinyl alcohol (LIQUIFILM TEARS) 1.4 % ophthalmic solution Place 1 drop into both eyes in the morning, at noon, in the evening, and at bedtime.  ? rOPINIRole (REQUIP) 1 MG tablet TAKE 1 TABLET EACH EVENING.  ? sennosides-docusate sodium (SENOKOT-S) 8.6-50 MG tablet Take 2 tablets by mouth daily as needed.  ? zinc oxide 20 % ointment Apply 1 application topically as needed for irritation. Special Instructions: To buttocks after every incontinent episode and as needed for redness. May keep at bedside.  ? [DISCONTINUED] traMADol (ULTRAM) 50 MG tablet Take 50 mg by mouth daily. For 14 days then evaluate  ? ?No facility-administered encounter medications on file as of 01/12/2022.  ? ? ?Review of Systems  ?Constitutional:  Negative for activity change, appetite change, chills, fatigue and fever.  ?HENT:  Negative for congestion and trouble swallowing.   ?Eyes:  Negative for visual disturbance.  ?Respiratory:  Negative for cough, shortness of breath and wheezing.   ?Cardiovascular:  Positive for leg swelling. Negative for chest pain.  ?Gastrointestinal:  Positive for constipation. Negative for abdominal distention, abdominal pain, diarrhea, nausea and vomiting.  ?Genitourinary:  Positive for dysuria and frequency. Negative for hematuria, vaginal bleeding and vaginal discharge.  ?Musculoskeletal:  Positive for arthralgias, gait problem and joint swelling.  ?Skin:  Negative for wound.  ?Neurological:  Positive for weakness. Negative for dizziness and headaches.  ?Psychiatric/Behavioral:  Negative for dysphoric mood and sleep disturbance. The patient is not  nervous/anxious.   ? ?Immunization History  ?Administered Date(s) Administered  ? Influenza, High Dose Seasonal PF 07/10/2017, 07/15/2019, 07/05/2020, 07/25/2021  ? Influenza,inj,Quad PF,6+ Mos 07/03/2018  ? Influenza-Unspecified 07/04/2015, 07/10/2017  ? Moderna SARS-COV2 Booster Vaccination 03/08/2021  ? Moderna Sars-Covid-2 Vaccination 10/05/2019, 11/02/2019, 08/15/2020, 06/21/2021  ? PFIZER(Purple Top)SARS-COV-2 Vaccination 06/21/2021  ? Pneumococcal Conjugate-13 08/18/2014  ? Pneumococcal Polysaccharide-23 07/26/2010  ? Tdap 11/12/2018  ? ?Pertinent  Health Maintenance Due  ?Topic Date Due  ? INFLUENZA VACCINE  05/01/2022  ? DEXA SCAN  Discontinued  ? ? ?  04/15/2019  ?  1:53 PM 07/15/2019  ?  2:32 PM 07/23/2019  ? 11:24 AM 08/12/2019  ?  2:51 PM 09/29/2021  ?  1:43 PM  ?Fall Risk  ?Falls in the past year? 0 0 0 0 1  ?Was there an injury with Fall? 0  0  0  ?Fall Risk Category Calculator 0  0  1  ?Fall Risk Category Low  Low  Low  ?Patient Fall Risk Level Low fall risk      ?Patient at Risk for Falls Due to  History of fall(s);Impaired balance/gait;Orthopedic patient  ?Fall risk Follow up     Falls evaluation completed;Education provided;Falls prevention discussed  ? ?Functional Status Survey: ?  ? ?Vitals:  ? 01/12/22 0949  ?BP: 127/82  ?Pulse: 97  ?Resp: 16  ?Temp: (!) 97 ?F (36.1 ?C)  ?SpO2: 93%  ?Weight: 124 lb 1.6 oz (56.3 kg)  ?Height: 5' (1.524 m)  ? ?Body mass index is 24.24 kg/m?Marland Kitchen ?Physical Exam ?Vitals reviewed.  ?Constitutional:   ?   General: She is not in acute distress. ?HENT:  ?   Head: Normocephalic.  ?Eyes:  ?   General:     ?   Right eye: No discharge.     ?   Left eye: No discharge.  ?Neck:  ?   Vascular: No carotid bruit.  ?Cardiovascular:  ?   Rate and Rhythm: Normal rate and regular rhythm.  ?   Pulses:     ?     Dorsalis pedis pulses are 1+ on the right side and 1+ on the left side.  ?     Posterior tibial pulses are 1+ on the right side and 1+ on the left side.  ?   Heart sounds:  Normal heart sounds.  ?Pulmonary:  ?   Effort: Pulmonary effort is normal. No respiratory distress.  ?   Breath sounds: Normal breath sounds. No wheezing.  ?Abdominal:  ?   General: Bowel sounds are normal. There is n

## 2022-01-15 DIAGNOSIS — I10 Essential (primary) hypertension: Secondary | ICD-10-CM | POA: Diagnosis not present

## 2022-01-15 LAB — BASIC METABOLIC PANEL
BUN: 34 — AB (ref 4–21)
CO2: 30 — AB (ref 13–22)
Chloride: 100 (ref 99–108)
Creatinine: 1 (ref 0.5–1.1)
Glucose: 79
Potassium: 3.9 mEq/L (ref 3.5–5.1)
Sodium: 139 (ref 137–147)

## 2022-01-15 LAB — COMPREHENSIVE METABOLIC PANEL
Calcium: 9.1 (ref 8.7–10.7)
eGFR: 50

## 2022-01-16 DIAGNOSIS — G2581 Restless legs syndrome: Secondary | ICD-10-CM | POA: Diagnosis not present

## 2022-01-16 DIAGNOSIS — R293 Abnormal posture: Secondary | ICD-10-CM | POA: Diagnosis not present

## 2022-01-16 DIAGNOSIS — M159 Polyosteoarthritis, unspecified: Secondary | ICD-10-CM | POA: Diagnosis not present

## 2022-01-16 DIAGNOSIS — Z9181 History of falling: Secondary | ICD-10-CM | POA: Diagnosis not present

## 2022-01-16 DIAGNOSIS — M76819 Anterior tibial syndrome, unspecified leg: Secondary | ICD-10-CM | POA: Diagnosis not present

## 2022-01-16 DIAGNOSIS — Z993 Dependence on wheelchair: Secondary | ICD-10-CM | POA: Diagnosis not present

## 2022-01-18 DIAGNOSIS — I10 Essential (primary) hypertension: Secondary | ICD-10-CM | POA: Diagnosis not present

## 2022-01-18 LAB — BASIC METABOLIC PANEL
BUN: 32 — AB (ref 4–21)
CO2: 34 — AB (ref 13–22)
Chloride: 96 — AB (ref 99–108)
Creatinine: 1 (ref 0.5–1.1)
Glucose: 85
Potassium: 3.7 mEq/L (ref 3.5–5.1)
Sodium: 136 — AB (ref 137–147)

## 2022-01-18 LAB — COMPREHENSIVE METABOLIC PANEL
Calcium: 9.4 (ref 8.7–10.7)
eGFR: 52

## 2022-01-19 ENCOUNTER — Encounter: Payer: Self-pay | Admitting: Orthopedic Surgery

## 2022-01-19 ENCOUNTER — Non-Acute Institutional Stay (SKILLED_NURSING_FACILITY): Payer: PPO | Admitting: Orthopedic Surgery

## 2022-01-19 DIAGNOSIS — R269 Unspecified abnormalities of gait and mobility: Secondary | ICD-10-CM

## 2022-01-19 DIAGNOSIS — M159 Polyosteoarthritis, unspecified: Secondary | ICD-10-CM

## 2022-01-19 DIAGNOSIS — R300 Dysuria: Secondary | ICD-10-CM

## 2022-01-19 DIAGNOSIS — I10 Essential (primary) hypertension: Secondary | ICD-10-CM

## 2022-01-19 DIAGNOSIS — M79671 Pain in right foot: Secondary | ICD-10-CM | POA: Diagnosis not present

## 2022-01-19 DIAGNOSIS — H04123 Dry eye syndrome of bilateral lacrimal glands: Secondary | ICD-10-CM

## 2022-01-19 DIAGNOSIS — G2581 Restless legs syndrome: Secondary | ICD-10-CM

## 2022-01-19 DIAGNOSIS — K5901 Slow transit constipation: Secondary | ICD-10-CM

## 2022-01-19 NOTE — Progress Notes (Signed)
?Location:  [facility] Room Number: N01/A ?Place of Service:  SNF (31) ?Provider:  Yvonna Alanis, NP ? ? ?Patient Care Team: ?Mast, Man X, NP as PCP - General (Internal Medicine) ?Wardell Honour, MD as Attending Physician (Family Medicine) ? ?Extended Emergency Contact Information ?Primary Emergency Contact: Popwell,Steve ?Address: [place] ?         [place] [address] [place] ?Home Phone: [phone] ?Mobile Phone: [phone] ?Relation: Son ?Secondary Emergency Contact: Heagle,Garth ?Address: [place] ?         [place], [place] [address] [place] ?Home Phone: [phone] ?Relation: Son ? ?Code Status:  DNR ?Goals of care: Advanced Directive information ? ?  01/19/2022  ?  9:49 AM  ?Advanced Directives  ?Does Patient Have a Medical Advance Directive? Yes  ?Type of Advance Directive Out of facility DNR (pink MOST or yellow form);Living will;Healthcare Power of Attorney  ?Does patient want to make changes to medical advance directive? No - Patient declined  ?Copy of [place] in Chart? Yes - validated most recent copy scanned in chart (See row information)  ?Pre-existing out of facility DNR order (yellow form or pink MOST form) Yellow form placed in chart (order not valid for inpatient use);Pink MOST form placed in chart (order not valid for inpatient use)  ? ? ? ?Chief Complaint  ?Patient presents with  ? Medical Management of Chronic Issues  ?  Routine visit.  ? Quality Metric Gaps  ?  Discuss the need for Shingrix vaccine, or post pone if patient refuses.   ? ? ?HPI:  ?Pt is a 89 y.o. female seen today for medical management of chronic diseases.   ? ?She continues to reside on the skilled nursing unit at [facility]. Past medical history includes: hypertension, PVD, osteoarthritis of multiple joints, CKD stage 3, posterior tendon dysfunction with right foot drop, constipation, restless leg syndrome and anxiety.  ? ?Right  foot pain- hx right foot drop, thought to be due to swelling, pain improved with  metolazone x 2 weeks, xray right foot negative for acute fracture or dislocation, 02/23 treated with doxycycline x 7 days for right toe cellulitis, WBC 9.2/ uric acid 4.4 11/20/2021 ?HTN- BUN/creat 32/1.0 01/18/2022, remains on metoprolol ?OA- involves right leg/shoulder/back pain, remains on tylenol prn ?Dysuria- ongoing, increased symptoms when she does not drink water well, unsuccessful trial of Uribel 06/2021, last urine culture inconclusive- recollection recommended, Estrace trial unsuccessful- also gave her breast tenderness, not interested in urology consult, remains on amitriptyline ?RLS- remains on requip qhs ?Gait abnormality- uses power wheelchair, no recent injuries ?Constipation- LBM 04/21, remains on colace, senna and miralax prn ?Dry eyes- improved with ocusoft pads every night, remains on artificial tears and Genteal ointment ? ?Recently seen by dermatology, actinic keratosis lesion removed from scalp.  ?   ?Recent blood pressures:  ? 04/18- 128/64 ? 04/11- 127/82 ? 04/04- 133/81 ? ?Recent weights: ? 04/04- 124.1 lbs ? 03/01- 122.6 lbs ? 02/05- 123.6 lbs ? ? ? ? ? ?Past Medical History:  ?Diagnosis Date  ? Anemia   ? Hypertension   ? OA (osteoarthritis)   ? Osteoporosis   ? Renal cyst   ? ?Past Surgical History:  ?Procedure Laterality Date  ? APPENDECTOMY    ? BLADDER SUSPENSION    ? CATARACT EXTRACTION    ? CESAREAN SECTION    ? REPLACEMENT TOTAL KNEE  1995  ? TONSILLECTOMY    ? ? ?  No Known Allergies ? ?Outpatient Encounter Medications as of 01/19/2022  ?Medication Sig  ? acetaminophen (TYLENOL) 325 MG tablet Take 650 mg by mouth every 4 (four) hours as needed.  ? acetaminophen (TYLENOL) 325 MG tablet Take 650 mg by mouth every evening.  ? amitriptyline (ELAVIL) 10 MG tablet Take 10 mg by mouth at bedtime.  ? aspirin EC 81 MG tablet Take 81 mg by mouth in the morning. Mondays and Thursdays  ? Biotin 5000 MCG TABS Take  5,000 mcg by mouth daily.   ? bisacodyl (DULCOLAX) 10 MG suppository Place 10 mg rectally every other day.  ? Carboxymethylcell-Hypromellose (GENTEAL) 0.25-0.3 % GEL Apply to eye daily.  ? Cholecalciferol (VITAMIN D) 50 MCG (2000 UT) CAPS Take 2,000 Units by mouth daily.   ? Eyelid Cleansers (OCUSOFT EYELID CLEANSING) PADS Apply topically daily.  ? magnesium hydroxide (MILK OF MAGNESIA) 400 MG/5ML suspension Take 30 mLs by mouth daily as needed for mild constipation.  ? metoprolol tartrate (LOPRESSOR) 50 MG tablet TAKE 1 TABLET BY MOUTH TWICE DAILY.  ? multivitamin-lutein (OCUVITE-LUTEIN) CAPS capsule Take 1 capsule by mouth 2 (two) times daily.   ? OVER THE COUNTER MEDICATION Place 1 application into both eyes daily. 0.25 inch Ribbon: Apply to left and right eye/ conjunctival sac.  ? polyethylene glycol (MIRALAX / GLYCOLAX) 17 g packet Take 17 g by mouth daily.  ? polyvinyl alcohol (LIQUIFILM TEARS) 1.4 % ophthalmic solution Place 1 drop into both eyes in the morning, at noon, in the evening, and at bedtime.  ? rOPINIRole (REQUIP) 1 MG tablet TAKE 1 TABLET EACH EVENING.  ? sennosides-docusate sodium (SENOKOT-S) 8.6-50 MG tablet Take 2 tablets by mouth daily as needed.  ? zinc oxide 20 % ointment Apply 1 application topically as needed for irritation. Special Instructions: To buttocks after every incontinent episode and as needed for redness. May keep at bedside.  ? ?No facility-administered encounter medications on file as of 01/19/2022.  ? ? ?Review of Systems  ?Constitutional:  Negative for activity change, appetite change, chills, fatigue and fever.  ?HENT:  Positive for hearing loss. Negative for congestion and trouble swallowing.   ?Eyes:  Negative for pain and discharge.  ?Respiratory:  Negative for cough, shortness of breath and wheezing.   ?Cardiovascular:  Positive for leg swelling. Negative for chest pain.  ?Gastrointestinal:  Positive for constipation. Negative for abdominal distention, abdominal pain,  blood in stool, diarrhea, nausea and vomiting.  ?Genitourinary:  Positive for dysuria and frequency. Negative for hematuria, vaginal bleeding and vaginal discharge.  ?Musculoskeletal:  Positive for arthralgias and gait problem.  ?Skin:  Positive for wound.  ?Neurological:  Positive for weakness. Negative for dizziness and headaches.  ?Psychiatric/Behavioral:  Negative for confusion, dysphoric mood and sleep disturbance. The patient is not nervous/anxious.   ? ?Immunization History  ?Administered Date(s) Administered  ? Influenza, High Dose Seasonal PF 07/10/2017, 07/15/2019, 07/05/2020, 07/25/2021  ? Influenza,inj,Quad PF,6+ Mos 07/03/2018  ? Influenza-Unspecified 07/04/2015, 07/10/2017  ? Moderna SARS-COV2 Booster Vaccination 03/08/2021  ? Moderna Sars-Covid-2 Vaccination 10/05/2019, 11/02/2019, 08/15/2020, 06/21/2021  ? PFIZER(Purple Top)SARS-COV-2 Vaccination 06/21/2021  ? Pneumococcal Conjugate-13 08/18/2014  ? Pneumococcal Polysaccharide-23 07/26/2010  ? Tdap 11/12/2018  ? ?Pertinent  Health Maintenance Due  ?Topic Date Due  ? INFLUENZA VACCINE  05/01/2022  ? DEXA SCAN  Discontinued  ? ? ?  04/15/2019  ?  1:53 PM 07/15/2019  ?  2:32 PM 07/23/2019  ? 11:24 AM 08/12/2019  ?  2:51 PM 09/29/2021  ?  1:43 PM  ?  Fall Risk  ?Falls in the past year? 0 0 0 0 1  ?Was there an injury with Fall? 0  0  0  ?Fall Risk Category Calculator 0  0  1  ?Fall Risk Category Low  Low  Low  ?Patient Fall Risk Level Low fall risk      ?Patient at Risk for Falls Due to     History of fall(s);Impaired balance/gait;Orthopedic patient  ?Fall risk Follow up     Falls evaluation completed;Education provided;Falls prevention discussed  ? ?Functional Status Survey: ?  ? ?Vitals:  ? 01/19/22 0934  ?BP: 128/64  ?Pulse: 72  ?Resp: 20  ?Temp: 98.6 ?F (37 ?C)  ?SpO2: 95%  ?Weight: 124 lb 1.6 oz (56.3 kg)  ?Height: 5' (1.524 m)  ? ?Body mass index is 24.24 kg/m?Marland Kitchen ?Physical Exam ?Vitals reviewed.  ?Constitutional:   ?   General: She is not in acute  distress. ?HENT:  ?   Head: Normocephalic.  ?   Comments: Pea sized skin lesion to parietal section, CDI, granulation tissue present, no drainage.  ?   Right Ear: There is no impacted cerumen.  ?   Left Ear:

## 2022-02-06 ENCOUNTER — Non-Acute Institutional Stay (SKILLED_NURSING_FACILITY): Payer: PPO | Admitting: Internal Medicine

## 2022-02-06 ENCOUNTER — Encounter: Payer: Self-pay | Admitting: Internal Medicine

## 2022-02-06 DIAGNOSIS — G2581 Restless legs syndrome: Secondary | ICD-10-CM | POA: Diagnosis not present

## 2022-02-06 DIAGNOSIS — R269 Unspecified abnormalities of gait and mobility: Secondary | ICD-10-CM | POA: Diagnosis not present

## 2022-02-06 DIAGNOSIS — I10 Essential (primary) hypertension: Secondary | ICD-10-CM

## 2022-02-06 DIAGNOSIS — R300 Dysuria: Secondary | ICD-10-CM | POA: Diagnosis not present

## 2022-02-06 DIAGNOSIS — R600 Localized edema: Secondary | ICD-10-CM | POA: Diagnosis not present

## 2022-02-06 NOTE — Progress Notes (Signed)
?Location:   [facility] ?Nursing Home Room Number: 1 ?Place of Service:  SNF (31) ?Provider:  Veleta Miners MD ? ?Mast, Man X, NP ? ?Patient Care Team: ?Mast, Man X, NP as PCP - General (Internal Medicine) ?Wardell Honour, MD as Attending Physician (Family Medicine) ? ?Extended Emergency Contact Information ?Primary Emergency Contact: Boutin,Steve ?Address: [place] ?         [place] [address] [place] ?Home Phone: [phone] ?Mobile Phone: [phone] ?Relation: Son ?Secondary Emergency Contact: Loewen,Garth ?Address: [place] ?         [place], [place] [address] [place] ?Home Phone: [phone] ?Relation: Son ? ?Code Status:  DNR Palliative Care Managed Care ?Goals of care: Advanced Directive information ? ?  02/06/2022  ?  3:56 PM  ?Advanced Directives  ?Does Patient Have a Medical Advance Directive? Yes  ?Type of Advance Directive Out of facility DNR (pink MOST or yellow form);Living will;Healthcare Power of Attorney  ?Does patient want to make changes to medical advance directive? No - Patient declined  ?Copy of [place] in Chart? Yes - validated most recent copy scanned in chart (See row information)  ?Pre-existing out of facility DNR order (yellow form or pink MOST form) Yellow form placed in chart (order not valid for inpatient use);Pink MOST form placed in chart (order not valid for inpatient use)  ? ? ? ?Chief Complaint  ?Patient presents with  ? Medical Management of Chronic Issues  ? Quality Metric Gaps  ?  Verified [facility] and [facility] patient is due for shingrix.  ? ? ?HPI:  ?Pt is a 88 y.o. female seen today for medical management of chronic diseases.   ? ?Patient has h/o Hypertension, Anxiety, Restless leg Syndrome, Insomnia, Arthritis and Posterior Tendon Dysfunction with right foot Valgus in Right Knee with inability to Ambulate ? ? Patient recently has been having Swelling in her leg ?Was started on Low doss of  metolazone which did help the swelling ?It also helped her Pain in the Legs ?Denies any SOB or PND ? ?She also has h/o pain during urination especially at night ?Failed Macrodantin and Flomax ?Now on Elavil which seems to be helping ? ?Other wise She is stable. No new Nursing issues. No Behavior issues ?Her weight is stable ?Uses Her Power chair ?No Falls ?Wt Readings from Last 3 Encounters:  ?02/06/22 124 lb 9.6 oz (56.5 kg)  ?01/19/22 124 lb 1.6 oz (56.3 kg)  ?01/12/22 124 lb 1.6 oz (56.3 kg)  ? ? ?Past Medical History:  ?Diagnosis Date  ? Anemia   ? Hypertension   ? OA (osteoarthritis)   ? Osteoporosis   ? Renal cyst   ? ?Past Surgical History:  ?Procedure Laterality Date  ? APPENDECTOMY    ? BLADDER SUSPENSION    ? CATARACT EXTRACTION    ? CESAREAN SECTION    ? REPLACEMENT TOTAL KNEE  1995  ? TONSILLECTOMY    ? ? ?No Known Allergies ? ?Allergies as of 02/06/2022   ?No Known Allergies ?  ? ?  ?Medication List  ?  ? ?  ? Accurate as of Feb 06, 2022  3:57 PM. If you have any questions, ask your nurse or doctor.  ?  ?  ? ?  ? ?acetaminophen 325 MG tablet ?Commonly known as: TYLENOL ?Take 650 mg by mouth every 4 (four) hours as needed. ?  ?acetaminophen 325 MG tablet ?Commonly known as: TYLENOL ?Take 650 mg  by mouth every evening. ?  ?amitriptyline 10 MG tablet ?Commonly known as: ELAVIL ?Take 10 mg by mouth at bedtime. ?  ?aspirin EC 81 MG tablet ?Take 81 mg by mouth in the morning. Mondays and Thursdays ?  ?Biotin 5000 MCG Tabs ?Take 5,000 mcg by mouth daily. ?  ?bisacodyl 10 MG suppository ?Commonly known as: DULCOLAX ?Place 10 mg rectally every other day. ?  ?GenTeal 0.25-0.3 % Gel ?Generic drug: Carboxymethylcell-Hypromellose ?Apply to eye daily. ?  ?magnesium hydroxide 400 MG/5ML suspension ?Commonly known as: MILK OF MAGNESIA ?Take 30 mLs by mouth daily as needed for mild constipation. ?  ?metoprolol tartrate 50 MG tablet ?Commonly known as: LOPRESSOR ?TAKE 1 TABLET BY MOUTH TWICE DAILY. ?  ?multivitamin-lutein  Caps capsule ?Take 1 capsule by mouth 2 (two) times daily. ?  ?OcuSoft Eyelid Cleansing Pads ?Apply topically daily. ?  ?OVER THE COUNTER MEDICATION ?Place 1 application into both eyes daily. 0.25 inch Ribbon: Apply to left and right eye/ conjunctival sac. ?  ?polyethylene glycol 17 g packet ?Commonly known as: MIRALAX / GLYCOLAX ?Take 17 g by mouth daily. ?  ?polyvinyl alcohol 1.4 % ophthalmic solution ?Commonly known as: LIQUIFILM TEARS ?Place 1 drop into both eyes in the morning, at noon, in the evening, and at bedtime. ?  ?rOPINIRole 1 MG tablet ?Commonly known as: REQUIP ?TAKE 1 TABLET EACH EVENING. ?  ?sennosides-docusate sodium 8.6-50 MG tablet ?Commonly known as: SENOKOT-S ?Take 2 tablets by mouth daily as needed. ?  ?Vitamin D 50 MCG (2000 UT) Caps ?Take 2,000 Units by mouth daily. ?  ?zinc oxide 20 % ointment ?Apply 1 application topically as needed for irritation. Special Instructions: To buttocks after every incontinent episode and as needed for redness. May keep at bedside. ?  ? ?  ? ? ?Review of Systems  ?Constitutional:  Negative for activity change and appetite change.  ?HENT: Negative.    ?Respiratory:  Negative for cough and shortness of breath.   ?Cardiovascular:  Positive for leg swelling.  ?Gastrointestinal:  Positive for constipation.  ?Genitourinary:  Positive for dysuria.  ?Musculoskeletal:  Positive for gait problem. Negative for arthralgias and myalgias.  ?Skin:  Positive for color change.  ?Neurological:  Negative for dizziness and weakness.  ?Psychiatric/Behavioral:  Positive for sleep disturbance. Negative for confusion and dysphoric mood.   ? ?Immunization History  ?Administered Date(s) Administered  ? Influenza, High Dose Seasonal PF 07/10/2017, 07/15/2019, 07/05/2020, 07/25/2021  ? Influenza,inj,Quad PF,6+ Mos 07/03/2018  ? Influenza-Unspecified 07/04/2015, 07/10/2017  ? Moderna SARS-COV2 Booster Vaccination 03/08/2021  ? Moderna Sars-Covid-2 Vaccination 10/05/2019, 11/02/2019,  08/15/2020, 06/21/2021  ? PFIZER(Purple Top)SARS-COV-2 Vaccination 06/21/2021  ? Pneumococcal Conjugate-13 08/18/2014  ? Pneumococcal Polysaccharide-23 07/26/2010  ? Tdap 11/12/2018  ? ?Pertinent  Health Maintenance Due  ?Topic Date Due  ? INFLUENZA VACCINE  05/01/2022  ? DEXA SCAN  Discontinued  ? ? ?  04/15/2019  ?  1:53 PM 07/15/2019  ?  2:32 PM 07/23/2019  ? 11:24 AM 08/12/2019  ?  2:51 PM 09/29/2021  ?  1:43 PM  ?Fall Risk  ?Falls in the past year? 0 0 0 0 1  ?Was there an injury with Fall? 0  0  0  ?Fall Risk Category Calculator 0  0  1  ?Fall Risk Category Low  Low  Low  ?Patient Fall Risk Level Low fall risk      ?Patient at Risk for Falls Due to     History of fall(s);Impaired balance/gait;Orthopedic patient  ?Fall risk Follow up  Falls evaluation completed;Education provided;Falls prevention discussed  ? ?Functional Status Survey: ?  ? ?Vitals:  ? 02/06/22 1553  ?BP: (!) 129/93  ?Pulse: 72  ?Resp: 20  ?Temp: (!) 96.5 ?F (35.8 ?C)  ?SpO2: 93%  ?Weight: 124 lb 9.6 oz (56.5 kg)  ?Height: 5' (1.524 m)  ? ?Body mass index is 24.33 kg/m?Marland Kitchen ?Physical Exam ?Vitals reviewed.  ?Constitutional:   ?   Appearance: Normal appearance.  ?HENT:  ?   Head: Normocephalic.  ?   Nose: Nose normal.  ?   Mouth/Throat:  ?   Mouth: Mucous membranes are moist.  ?   Pharynx: Oropharynx is clear.  ?Eyes:  ?   Pupils: Pupils are equal, round, and reactive to light.  ?Cardiovascular:  ?   Rate and Rhythm: Normal rate and regular rhythm.  ?   Pulses: Normal pulses.  ?   Heart sounds: Normal heart sounds. No murmur heard. ?Pulmonary:  ?   Effort: Pulmonary effort is normal.  ?   Breath sounds: Normal breath sounds.  ?Abdominal:  ?   General: Abdomen is flat. Bowel sounds are normal.  ?   Palpations: Abdomen is soft.  ?Musculoskeletal:  ?   Cervical back: Neck supple.  ?   Comments: Has moderate edema in her legs ?With Chronic venous changes  ?Skin: ?   General: Skin is warm.  ?Neurological:  ?   General: No focal deficit present.  ?    Mental Status: She is alert and oriented to person, place, and time.  ?Psychiatric:     ?   Mood and Affect: Mood normal.     ?   Thought Content: Thought content normal.  ? ? ?Labs reviewed: ?Recent Labs  ?  07/16/2

## 2022-02-12 DIAGNOSIS — R293 Abnormal posture: Secondary | ICD-10-CM | POA: Diagnosis not present

## 2022-02-12 DIAGNOSIS — Z993 Dependence on wheelchair: Secondary | ICD-10-CM | POA: Diagnosis not present

## 2022-02-12 DIAGNOSIS — Z9181 History of falling: Secondary | ICD-10-CM | POA: Diagnosis not present

## 2022-02-12 DIAGNOSIS — M76819 Anterior tibial syndrome, unspecified leg: Secondary | ICD-10-CM | POA: Diagnosis not present

## 2022-02-12 DIAGNOSIS — G2581 Restless legs syndrome: Secondary | ICD-10-CM | POA: Diagnosis not present

## 2022-02-12 DIAGNOSIS — M159 Polyosteoarthritis, unspecified: Secondary | ICD-10-CM | POA: Diagnosis not present

## 2022-02-19 DIAGNOSIS — I10 Essential (primary) hypertension: Secondary | ICD-10-CM | POA: Diagnosis not present

## 2022-02-19 LAB — BASIC METABOLIC PANEL
BUN: 41 — AB (ref 4–21)
CO2: 30 — AB (ref 13–22)
Chloride: 99 (ref 99–108)
Creatinine: 1.1 (ref 0.5–1.1)
Glucose: 83
Potassium: 3.7 mEq/L (ref 3.5–5.1)
Sodium: 137 (ref 137–147)

## 2022-02-19 LAB — COMPREHENSIVE METABOLIC PANEL
Calcium: 9.3 (ref 8.7–10.7)
eGFR: 46

## 2022-02-20 ENCOUNTER — Telehealth: Payer: Self-pay | Admitting: Internal Medicine

## 2022-02-20 NOTE — Telephone Encounter (Signed)
Patient BUN is elevated at 41 Creat is 1.09 Swelling in legs is Improved on Metolazone Will continue same dose Repeat BMP in 2 weeks

## 2022-03-05 DIAGNOSIS — I10 Essential (primary) hypertension: Secondary | ICD-10-CM | POA: Diagnosis not present

## 2022-03-05 LAB — BASIC METABOLIC PANEL
BUN: 32 — AB (ref 4–21)
CO2: 32 — AB (ref 13–22)
Chloride: 98 — AB (ref 99–108)
Creatinine: 0.9 (ref 0.5–1.1)
Glucose: 81
Potassium: 3.5 mEq/L (ref 3.5–5.1)
Sodium: 137 (ref 137–147)

## 2022-03-05 LAB — COMPREHENSIVE METABOLIC PANEL
Calcium: 9.2 (ref 8.7–10.7)
eGFR: 56

## 2022-03-09 ENCOUNTER — Encounter: Payer: Self-pay | Admitting: Orthopedic Surgery

## 2022-03-09 ENCOUNTER — Non-Acute Institutional Stay (SKILLED_NURSING_FACILITY): Payer: PPO | Admitting: Orthopedic Surgery

## 2022-03-09 DIAGNOSIS — R600 Localized edema: Secondary | ICD-10-CM

## 2022-03-09 DIAGNOSIS — H04123 Dry eye syndrome of bilateral lacrimal glands: Secondary | ICD-10-CM

## 2022-03-09 DIAGNOSIS — K5901 Slow transit constipation: Secondary | ICD-10-CM | POA: Diagnosis not present

## 2022-03-09 DIAGNOSIS — M159 Polyosteoarthritis, unspecified: Secondary | ICD-10-CM

## 2022-03-09 DIAGNOSIS — R300 Dysuria: Secondary | ICD-10-CM | POA: Diagnosis not present

## 2022-03-09 DIAGNOSIS — I10 Essential (primary) hypertension: Secondary | ICD-10-CM

## 2022-03-09 DIAGNOSIS — G2581 Restless legs syndrome: Secondary | ICD-10-CM | POA: Diagnosis not present

## 2022-03-09 DIAGNOSIS — R269 Unspecified abnormalities of gait and mobility: Secondary | ICD-10-CM

## 2022-03-09 NOTE — Progress Notes (Unsigned)
Location:  [place] Room Number: N01/A Place of Service:  SNF (31) Provider: Yvonna Alanis, NP   Patient Care Team: Mast, Man X, NP as PCP - General (Internal Medicine) Wardell Honour, MD as Attending Physician (Family Medicine)  Extended Emergency Contact Information Primary Emergency Contact: [facility] Address: [place] [address] [place] Phone: [phone] Mobile Phone: [phone] Relation: Son Secondary Emergency Contact: [facility] Address: [address]          [place], [place] [address] [place] Phone: [phone] Relation: Son  Code Status:  DNR Goals of care: Advanced Directive information    03/09/2022    2:16 PM  Advanced Directives  Does Patient Have a Medical Advance Directive? Yes  Type of Advance Directive Out of facility DNR (pink MOST or yellow form);Living will;Healthcare Power of Attorney  Does patient want to make changes to medical advance directive? No - Patient declined  Copy of [place] in Chart? Yes - validated most recent copy scanned in chart (See row information)  Pre-existing out of facility DNR order (yellow form or pink MOST form) Yellow form placed in chart (order not valid for inpatient use);Pink MOST form placed in chart (order not valid for inpatient use)     Chief Complaint  Patient presents with   Medical Management of Chronic Issues    Routine visit   Quality Metric Gaps    Discuss the need for Shingrix vaccine, or post pone if patient refuses.     HPI:  Pt is a 88 y.o. female seen today for medical management of chronic diseases.    She continues to reside on the skilled nursing unit at [facility]. Past medical history includes: hypertension, PVD, osteoarthritis of multiple joints, CKD stage 3, posterior tendon dysfunction with right foot drop, constipation, restless leg syndrome and anxiety.   HTN-  BUN/creat 32/0.9 03/05/2022, remains on metoprolol BLE- R>L, given metolazone twice weekly, also uses leg wraps, she also elevated legs with recliner, BUN/creat 31/0.9 03/05/2022 OA- involves right leg/shoulder/back pain, remains on tylenol prn Dysuria- ongoing, increased symptoms when she does not drink water well, unsuccessful trial of Uribel 06/2021 and Estrace (breast tenderness), last urine culture inconclusive, not interested in urology consult, remains on amitriptyline RLS- remains on requip qhs Gait abnormality- uses power wheelchair, no recent injuries Constipation- LBM 06/09, remains on colace, senna and miralax prn Dry eyes- stable with ocusoft pads, artificial tears and Genteal ointment  No recent falls or injuries.   Recent blood pressures:  06/06- 139/80  05/30- 125/95  05/23- 114/78  Recent weights:  06/02- 127.6 lbs  05/03- 124.6 lbs  04/04- 124.1 lbs    Past Medical History:  Diagnosis Date   Anemia    Hypertension    OA (osteoarthritis)    Osteoporosis    Renal cyst    Past Surgical History:  Procedure Laterality Date   APPENDECTOMY     BLADDER SUSPENSION     CATARACT EXTRACTION     CESAREAN SECTION     REPLACEMENT TOTAL KNEE  1995   TONSILLECTOMY      No Known Allergies  Outpatient Encounter Medications as of 03/09/2022  Medication Sig   acetaminophen (TYLENOL) 325 MG tablet Take 650 mg by mouth every 4 (four) hours as needed.   acetaminophen (TYLENOL) 325 MG tablet Take 650 mg by mouth every evening.  amitriptyline (ELAVIL) 10 MG tablet Take 10 mg by mouth at bedtime.   aspirin EC 81 MG tablet Take 81 mg by mouth in the morning. Mondays and Thursdays   Biotin 5000 MCG TABS Take 5,000 mcg by mouth daily.    bisacodyl (DULCOLAX) 10 MG suppository Place 10 mg rectally every other day.   Carboxymethylcell-Hypromellose (GENTEAL) 0.25-0.3 % GEL Apply to eye daily.   Cholecalciferol (VITAMIN D) 50 MCG (2000 UT) CAPS Take 2,000 Units by mouth daily.     magnesium hydroxide (MILK OF MAGNESIA) 400 MG/5ML suspension Take 30 mLs by mouth daily as needed for mild constipation.   metoprolol tartrate (LOPRESSOR) 50 MG tablet TAKE 1 TABLET BY MOUTH TWICE DAILY.   multivitamin-lutein (OCUVITE-LUTEIN) CAPS capsule Take 1 capsule by mouth 2 (two) times daily.    polyethylene glycol (MIRALAX / GLYCOLAX) 17 g packet Take 17 g by mouth daily.   polyvinyl alcohol (LIQUIFILM TEARS) 1.4 % ophthalmic solution Place 1 drop into both eyes in the morning, at noon, in the evening, and at bedtime.   rOPINIRole (REQUIP) 1 MG tablet TAKE 1 TABLET EACH EVENING.   sennosides-docusate sodium (SENOKOT-S) 8.6-50 MG tablet Take 2 tablets by mouth daily as needed.   zinc oxide 20 % ointment Apply 1 application topically as needed for irritation. Special Instructions: To buttocks after every incontinent episode and as needed for redness. May keep at bedside.   [DISCONTINUED] Eyelid Cleansers (OCUSOFT EYELID CLEANSING) PADS Apply topically daily.   [DISCONTINUED] OVER THE COUNTER MEDICATION Place 1 application into both eyes daily. 0.25 inch Ribbon: Apply to left and right eye/ conjunctival sac.   No facility-administered encounter medications on file as of 03/09/2022.    Review of Systems  Constitutional:  Negative for activity change, appetite change, chills, fatigue and fever.  HENT:  Negative for congestion and trouble swallowing.   Eyes:  Negative for visual disturbance.  Respiratory:  Negative for cough, shortness of breath and wheezing.   Cardiovascular:  Positive for leg swelling. Negative for chest pain.  Gastrointestinal:  Positive for constipation. Negative for abdominal distention, abdominal pain, diarrhea, nausea and vomiting.  Genitourinary:  Positive for dysuria. Negative for frequency, hematuria, vaginal bleeding and vaginal discharge.       Incontinence  Musculoskeletal:  Positive for arthralgias, back pain and gait problem.  Skin:  Negative for wound.   Neurological:  Positive for weakness and numbness. Negative for dizziness and headaches.  Psychiatric/Behavioral:  Negative for confusion, dysphoric mood and sleep disturbance. The patient is not nervous/anxious.     Immunization History  Administered Date(s) Administered   Influenza, High Dose Seasonal PF 07/10/2017, 07/15/2019, 07/05/2020, 07/25/2021   Influenza,inj,Quad PF,6+ Mos 07/03/2018   Influenza-Unspecified 07/04/2015, 07/10/2017   Moderna SARS-COV2 Booster Vaccination 03/08/2021   Moderna Sars-Covid-2 Vaccination 10/05/2019, 11/02/2019, 08/15/2020, 06/21/2021   PFIZER(Purple Top)SARS-COV-2 Vaccination 06/21/2021   Pneumococcal Conjugate-13 08/18/2014   Pneumococcal Polysaccharide-23 07/26/2010   Tdap 11/12/2018   Pertinent  Health Maintenance Due  Topic Date Due   INFLUENZA VACCINE  05/01/2022   DEXA SCAN  Discontinued      04/15/2019    1:53 PM 07/15/2019    2:32 PM 07/23/2019   11:24 AM 08/12/2019    2:51 PM 09/29/2021    1:43 PM  Fall Risk  Falls in the past year? 0 0 0 0 1  Was there an injury with Fall? 0  0  0  Fall Risk Category Calculator 0  0  1  Fall Risk Category Low  Low  Low  Patient Fall Risk Level Low fall risk      Patient at Risk for Falls Due to     History of fall(s);Impaired balance/gait;Orthopedic patient  Fall risk Follow up     Falls evaluation completed;Education provided;Falls prevention discussed   Functional Status Survey:    Vitals:   03/09/22 1407  BP: 139/80  Pulse: 75  Temp: (!) 96.9 F (36.1 C)  SpO2: 93%  Weight: 127 lb 9.6 oz (57.9 kg)  Height: 5' (1.524 m)   Body mass index is 24.92 kg/m. Physical Exam Vitals reviewed.  Constitutional:      General: She is not in acute distress. HENT:     Head: Normocephalic.     Right Ear: There is no impacted cerumen.     Left Ear: There is no impacted cerumen.     Nose: Nose normal.     Mouth/Throat:     Mouth: Mucous membranes are moist.  Eyes:     General:         Right eye: No discharge.        Left eye: No discharge.  Cardiovascular:     Rate and Rhythm: Normal rate and regular rhythm.     Pulses: Normal pulses.     Heart sounds: Normal heart sounds.  Pulmonary:     Effort: Pulmonary effort is normal. No respiratory distress.     Breath sounds: Normal breath sounds. No wheezing.  Abdominal:     General: Bowel sounds are normal. There is no distension.     Palpations: Abdomen is soft.     Tenderness: There is no abdominal tenderness.  Musculoskeletal:     Cervical back: Neck supple.     Right lower leg: Edema present.     Left lower leg: Edema present.     Comments: R>L, 1+ pitting, right foot drop  Skin:    General: Skin is warm and dry.     Capillary Refill: Capillary refill takes less than 2 seconds.  Neurological:     General: No focal deficit present.     Mental Status: She is alert and oriented to person, place, and time.     Motor: Weakness present.     Gait: Gait abnormal.     Comments: PWC, resting hand tremor L>R  Psychiatric:        Mood and Affect: Mood normal.        Behavior: Behavior normal.     Labs reviewed: Recent Labs    01/18/22 0000 02/19/22 0000 03/05/22 0000  NA 136* 137 137  K 3.7 3.7 3.5  CL 96* 99 98*  CO2 34* 30* 32*  BUN 32* 41* 32*  CREATININE 1.0 1.1 0.9  CALCIUM 9.4 9.3 9.2   Recent Labs    04/15/21 0000 11/02/21 0000  AST 11* 11*  ALT 6* 8  ALKPHOS 58 65  ALBUMIN 3.1* 3.6   Recent Labs    04/15/21 0000 11/02/21 0000 11/20/21 0000  WBC 8.0 6.8 9.2  NEUTROABS 4,032.00 3,012.00 5,373.00  HGB 11.3* 11.9* 12.1  HCT 34* 36 36  PLT 196 215 239   Lab Results  Component Value Date   TSH 2.01 11/02/2021   No results found for: "HGBA1C" Lab Results  Component Value Date   CHOL 175 07/23/2019   HDL 54 07/23/2019   LDLCALC 105 (H) 07/23/2019   TRIG 74 07/23/2019   CHOLHDL 3.2 07/23/2019    Significant Diagnostic Results in last 30 days:  No results  found.  Assessment/Plan 1. Essential hypertension - controlled with metoprolol  2. Bilateral leg edema - R>L, 1+ pitting - stable with metolazone 2x/week  3. Primary osteoarthritis involving multiple joints - cont tylenol   4. Dysuria - ongoing - improved with adequate hydration - cont amitriptyline  5. RLS (restless legs syndrome) - cont Requip  6. Gait abnormality - uses PWC - cont skilled nursing care  7. Slow transit constipation - LBM 06/09, abdomen soft - cont senna and miralax  8. Dry eyes, bilateral - cont ocusoft pads, artificial tears and Genteal ointment    Family/ staff Communication: plan discussed with patient and nurse  Labs/tests ordered:  none

## 2022-03-14 ENCOUNTER — Telehealth: Payer: Self-pay | Admitting: Orthopedic Surgery

## 2022-03-14 ENCOUNTER — Other Ambulatory Visit: Payer: Self-pay | Admitting: Orthopedic Surgery

## 2022-03-14 DIAGNOSIS — R600 Localized edema: Secondary | ICD-10-CM

## 2022-03-14 MED ORDER — POTASSIUM CHLORIDE CRYS ER 20 MEQ PO TBCR: 20.0000 meq | EXTENDED_RELEASE_TABLET | ORAL | 0 refills | Status: DC

## 2022-03-14 NOTE — Telephone Encounter (Signed)
03/05/2022 potassium 3.5. She remains on Metolazone twice weekly due to BLE. Recheck bmp 03/15/2022, start potassium 20 meq 2x/week- give with metolazone. New orders discussed with pharmacist.

## 2022-03-15 DIAGNOSIS — I10 Essential (primary) hypertension: Secondary | ICD-10-CM | POA: Diagnosis not present

## 2022-03-15 LAB — BASIC METABOLIC PANEL
BUN: 32 — AB (ref 4–21)
CO2: 34 — AB (ref 13–22)
Chloride: 97 — AB (ref 99–108)
Creatinine: 1 (ref 0.5–1.1)
Glucose: 83
Potassium: 3.7 mEq/L (ref 3.5–5.1)
Sodium: 136 — AB (ref 137–147)

## 2022-03-15 LAB — COMPREHENSIVE METABOLIC PANEL
Calcium: 9.6 (ref 8.7–10.7)
eGFR: 49

## 2022-04-11 ENCOUNTER — Non-Acute Institutional Stay (SKILLED_NURSING_FACILITY): Payer: PPO | Admitting: Orthopedic Surgery

## 2022-04-11 ENCOUNTER — Encounter: Payer: Self-pay | Admitting: Orthopedic Surgery

## 2022-04-11 DIAGNOSIS — K5901 Slow transit constipation: Secondary | ICD-10-CM | POA: Diagnosis not present

## 2022-04-11 DIAGNOSIS — M159 Polyosteoarthritis, unspecified: Secondary | ICD-10-CM

## 2022-04-11 DIAGNOSIS — H04123 Dry eye syndrome of bilateral lacrimal glands: Secondary | ICD-10-CM

## 2022-04-11 DIAGNOSIS — G2581 Restless legs syndrome: Secondary | ICD-10-CM | POA: Diagnosis not present

## 2022-04-11 DIAGNOSIS — I10 Essential (primary) hypertension: Secondary | ICD-10-CM

## 2022-04-11 DIAGNOSIS — R600 Localized edema: Secondary | ICD-10-CM | POA: Diagnosis not present

## 2022-04-11 DIAGNOSIS — R269 Unspecified abnormalities of gait and mobility: Secondary | ICD-10-CM

## 2022-04-11 DIAGNOSIS — R300 Dysuria: Secondary | ICD-10-CM

## 2022-04-11 NOTE — Progress Notes (Addendum)
Location:   [place] Room Number: 1 Place of Service:  SNF (31) Provider:  Yvonna Alanis, NP   Mast, Man X, NP  Patient Care Team: Mast, Man X, NP as PCP - General (Internal Medicine) Wardell Honour, MD as Attending Physician (Family Medicine)  Extended Emergency Contact Information Primary Emergency Contact: [facility] Address: [place]          [place] [address] [place] Phone: [phone] Mobile Phone: [phone] Relation: Son Secondary Emergency Contact: [facility] Address: [address]          [place], [place] [address] [place] Phone: [phone] Relation: Son  Code Status:  DNR Palliative Care Managed Care Goals of care: Advanced Directive information    04/11/2022    9:52 AM  Advanced Directives  Does Patient Have a Medical Advance Directive? Yes  Type of Advance Directive Out of facility DNR (pink MOST or yellow form);Living will;Healthcare Power of Attorney  Does patient want to make changes to medical advance directive? No - Patient declined  Copy of [place] in Chart? Yes - validated most recent copy scanned in chart (See row information)  Pre-existing out of facility DNR order (yellow form or pink MOST form) Yellow form placed in chart (order not valid for inpatient use);Pink MOST form placed in chart (order not valid for inpatient use)     Chief Complaint  Patient presents with   Medical Management of Chronic Issues   Quality Metric Gaps    Verified [facility] and matrix patient is due for Shingrix.     HPI:  Pt is a 88 y.o. female seen today for medical management of chronic diseases.    She continues to reside on the skilled nursing unit at [facility]. Past medical history includes: hypertension, PVD, osteoarthritis of multiple joints, CKD stage 3, posterior tendon dysfunction with right foot drop, constipation, restless leg syndrome and anxiety.     BLE- R>L, given metolazone/potassium twice weekly, not wrapping legs anymore, sometimes elevated legs in recliner, BUN/creat 32/1.0 03/15/2022, K+ 3.7 03/15/2022 HTN- BUN/creat 32/0.9 03/05/2022, remains on metoprolol OA- involves right leg/shoulder/back pain, remains on tylenol  Dysuria- ongoing, increased symptoms when she does not drink water well, unsuccessful trial of Uribel 06/2021 and Estrace (breast tenderness), last urine culture inconclusive, not interested in urology consult, remains on amitriptyline RLS- remains on requip qhs Gait abnormality- uses power wheelchair, no recent injuries Constipation- LBM 07/11, remains on colace, senna and miralax prn Dry eyes- stable with artificial tears and Genteal ointment  No recent falls or injuries. Ambulates well wit PWC.   Recent blood pressures:  07/11- 110/76  07/04- 114/78  06/27- 107/69  Recent weights:  07/03- 124.6 lbs  06/02- 127.6 lbs  05/03- 124.6 lbs    Past Medical History:  Diagnosis Date   Anemia    Hypertension    OA (osteoarthritis)    Osteoporosis    Renal cyst    Past Surgical History:  Procedure Laterality Date   APPENDECTOMY     BLADDER SUSPENSION     CATARACT EXTRACTION     CESAREAN SECTION     REPLACEMENT TOTAL KNEE  1995   TONSILLECTOMY      No Known Allergies  Allergies as of 04/11/2022   No Known Allergies      Medication List        Accurate as of April 11, 2022  9:52 AM. If you have  any questions, ask your nurse or doctor.          acetaminophen 325 MG tablet Commonly known as: TYLENOL Take 650 mg by mouth every 4 (four) hours as needed.   acetaminophen 325 MG tablet Commonly known as: TYLENOL Take 650 mg by mouth every evening.   amitriptyline 10 MG tablet Commonly known as: ELAVIL Take 10 mg by mouth at bedtime.   aspirin EC 81 MG tablet Take 81 mg by mouth in the morning. Mondays and Thursdays   Biotin 5000 MCG Tabs Take 5,000 mcg by mouth daily.   bisacodyl  10 MG suppository Commonly known as: DULCOLAX Place 10 mg rectally every other day.   GenTeal 0.25-0.3 % Gel Generic drug: Carboxymethylcell-Hypromellose Apply to eye daily.   magnesium hydroxide 400 MG/5ML suspension Commonly known as: MILK OF MAGNESIA Take 30 mLs by mouth daily as needed for mild constipation.   metolazone 2.5 MG tablet Commonly known as: ZAROXOLYN Take 2.5 mg by mouth 2 (two) times a week.   metoprolol tartrate 50 MG tablet Commonly known as: LOPRESSOR TAKE 1 TABLET BY MOUTH TWICE DAILY.   multivitamin-lutein Caps capsule Take 1 capsule by mouth 2 (two) times daily.   polyethylene glycol 17 g packet Commonly known as: MIRALAX / GLYCOLAX Take 17 g by mouth daily.   polyvinyl alcohol 1.4 % ophthalmic solution Commonly known as: LIQUIFILM TEARS Place 1 drop into both eyes in the morning, at noon, in the evening, and at bedtime.   potassium chloride SA 20 MEQ tablet Commonly known as: KLOR-CON M Take 1 tablet (20 mEq total) by mouth 2 (two) times a week. Give with Metolazone   rOPINIRole 1 MG tablet Commonly known as: REQUIP TAKE 1 TABLET EACH EVENING.   sennosides-docusate sodium 8.6-50 MG tablet Commonly known as: SENOKOT-S Take 2 tablets by mouth daily as needed.   Vitamin D 50 MCG (2000 UT) Caps Take 2,000 Units by mouth daily.   zinc oxide 20 % ointment Apply 1 application topically as needed for irritation. Special Instructions: To buttocks after every incontinent episode and as needed for redness. May keep at bedside.        Review of Systems  Constitutional:  Negative for activity change, appetite change, chills, fatigue and fever.  HENT:  Positive for hearing loss. Negative for congestion and trouble swallowing.   Eyes:  Negative for visual disturbance.  Respiratory:  Negative for cough, shortness of breath and wheezing.   Cardiovascular:  Positive for leg swelling. Negative for chest pain.  Gastrointestinal:  Positive for  constipation. Negative for abdominal distention, abdominal pain, diarrhea, nausea and vomiting.  Genitourinary:  Positive for dysuria. Negative for frequency, hematuria and vaginal bleeding.  Musculoskeletal:  Positive for arthralgias, back pain and gait problem.  Skin:  Negative for wound.  Neurological:  Positive for tremors and weakness. Negative for dizziness and headaches.  Psychiatric/Behavioral:  Negative for confusion, dysphoric mood and sleep disturbance. The patient is not nervous/anxious.     Immunization History  Administered Date(s) Administered   Influenza, High Dose Seasonal PF 07/10/2017, 07/15/2019, 07/05/2020, 07/25/2021   Influenza,inj,Quad PF,6+ Mos 07/03/2018   Influenza-Unspecified 07/04/2015, 07/10/2017   Moderna SARS-COV2 Booster Vaccination 03/08/2021   Moderna Sars-Covid-2 Vaccination 10/05/2019, 11/02/2019, 08/15/2020, 06/21/2021   PFIZER(Purple Top)SARS-COV-2 Vaccination 06/21/2021   Pneumococcal Conjugate-13 08/18/2014   Pneumococcal Polysaccharide-23 07/26/2010   Tdap 11/12/2018   Pertinent  Health Maintenance Due  Topic Date Due   INFLUENZA VACCINE  05/01/2022   DEXA SCAN  Discontinued  04/15/2019    1:53 PM 07/15/2019    2:32 PM 07/23/2019   11:24 AM 08/12/2019    2:51 PM 09/29/2021    1:43 PM  Fall Risk  Falls in the past year? 0 0 0 0 1  Was there an injury with Fall? 0  0  0  Fall Risk Category Calculator 0  0  1  Fall Risk Category Low  Low  Low  Patient Fall Risk Level Low fall risk      Patient at Risk for Falls Due to     History of fall(s);Impaired balance/gait;Orthopedic patient  Fall risk Follow up     Falls evaluation completed;Education provided;Falls prevention discussed   Functional Status Survey:    Vitals:   04/11/22 0947  BP: 110/76  Pulse: 74  Resp: 20  Temp: (!) 97.2 F (36.2 C)  SpO2: 95%  Weight: 124 lb 9.6 oz (56.5 kg)  Height: 5' (1.524 m)   Body mass index is 24.33 kg/m. Physical Exam Vitals reviewed.   Constitutional:      General: She is not in acute distress. HENT:     Head: Normocephalic.     Right Ear: There is no impacted cerumen.     Left Ear: There is no impacted cerumen.     Nose: Nose normal.     Mouth/Throat:     Mouth: Mucous membranes are moist.  Eyes:     General:        Right eye: No discharge.        Left eye: No discharge.  Cardiovascular:     Rate and Rhythm: Normal rate and regular rhythm.     Pulses: Normal pulses.     Heart sounds: Normal heart sounds.  Pulmonary:     Effort: Pulmonary effort is normal. No respiratory distress.     Breath sounds: Normal breath sounds. No wheezing.  Abdominal:     General: Abdomen is flat. There is no distension.     Palpations: Abdomen is soft.     Tenderness: There is no abdominal tenderness.  Musculoskeletal:     Cervical back: Neck supple.     Right lower leg: Edema present.     Left lower leg: Edema present.     Comments: 2+ R>L, lower extremities tender to touch, right foot drop  Lymphadenopathy:     Cervical: No cervical adenopathy.  Skin:    General: Skin is warm and dry.     Capillary Refill: Capillary refill takes less than 2 seconds.  Neurological:     General: No focal deficit present.     Mental Status: She is alert. Mental status is at baseline.     Motor: Weakness present.     Gait: Gait abnormal.     Comments: PWC, resting hand tremor L>R  Psychiatric:        Mood and Affect: Mood normal.        Behavior: Behavior normal.     Labs reviewed: Recent Labs    02/19/22 0000 03/05/22 0000 03/15/22 0000  NA 137 137 136*  K 3.7 3.5 3.7  CL 99 98* 97*  CO2 30* 32* 34*  BUN 41* 32* 32*  CREATININE 1.1 0.9 1.0  CALCIUM 9.3 9.2 9.6   Recent Labs    04/15/21 0000 11/02/21 0000  AST 11* 11*  ALT 6* 8  ALKPHOS 58 65  ALBUMIN 3.1* 3.6   Recent Labs    04/15/21 0000 11/02/21 0000 11/20/21 0000  WBC  8.0 6.8 9.2  NEUTROABS 4,032.00 3,012.00 5,373.00  HGB 11.3* 11.9* 12.1  HCT 34* 36 36   PLT 196 215 239   Lab Results  Component Value Date   TSH 2.01 11/02/2021   No results found for: "HGBA1C" Lab Results  Component Value Date   CHOL 175 07/23/2019   HDL 54 07/23/2019   LDLCALC 105 (H) 07/23/2019   TRIG 74 07/23/2019   CHOLHDL 3.2 07/23/2019    Significant Diagnostic Results in last 30 days:  No results found.  Assessment/Plan 1. Bilateral leg edema - ongoing - pain stable with metolazone/KCL 2x/week - K+ stable   2. Essential hypertension - controlled - cont metoprolol  3. Primary osteoarthritis involving multiple joints - cont tylenol  4. Dysuria - ongoing - symptoms improved with drinking water - cont amitriptyline  5. RLS (restless legs syndrome) - cont Requip  6. Gait abnormality - uses PWC- no accidents - cont skilled nursing care  7. Slow transit constipation - LBM 07/11, abdomen soft - cont senna and miralax prn  8. Dry eyes, bilateral - stable with artificial tears and Genteal ointment    Family/ staff Communication:   Labs/tests ordered:

## 2022-04-12 DIAGNOSIS — I10 Essential (primary) hypertension: Secondary | ICD-10-CM | POA: Diagnosis not present

## 2022-05-24 ENCOUNTER — Encounter: Payer: Self-pay | Admitting: Internal Medicine

## 2022-05-24 ENCOUNTER — Non-Acute Institutional Stay (SKILLED_NURSING_FACILITY): Payer: PPO | Admitting: Internal Medicine

## 2022-05-24 DIAGNOSIS — R269 Unspecified abnormalities of gait and mobility: Secondary | ICD-10-CM

## 2022-05-24 DIAGNOSIS — I10 Essential (primary) hypertension: Secondary | ICD-10-CM | POA: Diagnosis not present

## 2022-05-24 DIAGNOSIS — R300 Dysuria: Secondary | ICD-10-CM | POA: Diagnosis not present

## 2022-05-24 DIAGNOSIS — K5901 Slow transit constipation: Secondary | ICD-10-CM | POA: Diagnosis not present

## 2022-05-24 DIAGNOSIS — M159 Polyosteoarthritis, unspecified: Secondary | ICD-10-CM

## 2022-05-24 DIAGNOSIS — G2581 Restless legs syndrome: Secondary | ICD-10-CM | POA: Diagnosis not present

## 2022-05-24 DIAGNOSIS — R600 Localized edema: Secondary | ICD-10-CM

## 2022-05-24 NOTE — Progress Notes (Signed)
Location:  [place] of Service:  SNF (31)  Provider: Virgie Dad   Code Status: DNR Goals of Care:     04/11/2022    9:52 AM  Advanced Directives  Does Patient Have a Medical Advance Directive? Yes  Type of Advance Directive Out of facility DNR (pink MOST or yellow form);Living will;Healthcare Power of Attorney  Does patient want to make changes to medical advance directive? No - Patient declined  Copy of [place] in Chart? Yes - validated most recent copy scanned in chart (See row information)  Pre-existing out of facility DNR order (yellow form or pink MOST form) Yellow form placed in chart (order not valid for inpatient use);Pink MOST form placed in chart (order not valid for inpatient use)     Chief Complaint  Patient presents with   Chronic Care Management    HPI: Patient is a 88 y.o. female seen today for medical management of chronic diseases.    Lives in SNF   Patient has h/o Hypertension, Anxiety, Restless leg Syndrome, Insomnia, Arthritis and Posterior Tendon Dysfunction with right foot Valgus in Right Knee with inability to Ambulate  She  is stable.No new Nursing issues. Continues to have some issues with Edema which is controlled with low dose of Metolazone Still c/o Pain with voiding but does not want further testing Her weight is stable Ambulates with her power chair Cognitively doing well Needs help with her ADLS No Falls Wt Readings from Last 3 Encounters:  05/24/22 121 lb 3.2 oz (55 kg)  04/11/22 124 lb 9.6 oz (56.5 kg)  03/09/22 127 lb 9.6 oz (57.9 kg)  Her Appetite is poor and has lost weight   Past Medical History:  Diagnosis Date   Anemia    Hypertension    OA (osteoarthritis)    Osteoporosis    Renal cyst     Past Surgical History:  Procedure Laterality Date   APPENDECTOMY     BLADDER SUSPENSION     CATARACT EXTRACTION     CESAREAN SECTION     REPLACEMENT TOTAL KNEE  1995   TONSILLECTOMY       No Known Allergies  Outpatient Encounter Medications as of 05/24/2022  Medication Sig   acetaminophen (TYLENOL) 325 MG tablet Take 650 mg by mouth every 4 (four) hours as needed.   acetaminophen (TYLENOL) 325 MG tablet Take 650 mg by mouth every evening.   amitriptyline (ELAVIL) 10 MG tablet Take 10 mg by mouth at bedtime.   aspirin EC 81 MG tablet Take 81 mg by mouth in the morning. Mondays and Thursdays   Biotin 5000 MCG TABS Take 5,000 mcg by mouth daily.    bisacodyl (DULCOLAX) 10 MG suppository Place 10 mg rectally every other day.   Carboxymethylcell-Hypromellose (GENTEAL) 0.25-0.3 % GEL Apply to eye daily.   Cholecalciferol (VITAMIN D) 50 MCG (2000 UT) CAPS Take 2,000 Units by mouth daily.    magnesium hydroxide (MILK OF MAGNESIA) 400 MG/5ML suspension Take 30 mLs by mouth daily as needed for mild constipation.   metolazone (ZAROXOLYN) 2.5 MG tablet Take 2.5 mg by mouth 2 (two) times a week.   metoprolol tartrate (LOPRESSOR) 50 MG tablet TAKE 1 TABLET BY MOUTH TWICE DAILY.   multivitamin-lutein (OCUVITE-LUTEIN) CAPS capsule Take 1 capsule by mouth 2 (two) times daily.    polyethylene glycol (MIRALAX / GLYCOLAX) 17 g packet Take 17 g by mouth daily.   polyvinyl alcohol (LIQUIFILM TEARS) 1.4 %  ophthalmic solution Place 1 drop into both eyes in the morning, at noon, in the evening, and at bedtime.   potassium chloride SA (KLOR-CON M) 20 MEQ tablet Take 1 tablet (20 mEq total) by mouth 2 (two) times a week. Give with Metolazone   rOPINIRole (REQUIP) 1 MG tablet TAKE 1 TABLET EACH EVENING.   sennosides-docusate sodium (SENOKOT-S) 8.6-50 MG tablet Take 2 tablets by mouth daily as needed.   zinc oxide 20 % ointment Apply 1 application topically as needed for irritation. Special Instructions: To buttocks after every incontinent episode and as needed for redness. May keep at bedside.   No facility-administered encounter medications on file as of 05/24/2022.    Review of Systems:  Review  of Systems  Constitutional:  Negative for activity change and appetite change.  HENT: Negative.    Respiratory:  Negative for cough and shortness of breath.   Cardiovascular:  Negative for leg swelling.  Gastrointestinal:  Positive for constipation.  Genitourinary:  Positive for dysuria. Negative for difficulty urinating.  Musculoskeletal:  Positive for gait problem. Negative for arthralgias and myalgias.  Skin: Negative.   Neurological:  Negative for dizziness and weakness.  Psychiatric/Behavioral:  Negative for confusion, dysphoric mood and sleep disturbance.     Health Maintenance  Topic Date Due   Zoster Vaccines- Shingrix (1 of 2) Never done   INFLUENZA VACCINE  05/01/2022   COVID-19 Vaccine (5 - Moderna series) 08/31/2022 (Originally 08/16/2021)   TETANUS/TDAP  11/12/2028   Pneumonia Vaccine 19+ Years old  Completed   HPV VACCINES  Aged Out   DEXA SCAN  Discontinued    Physical Exam: Vitals:   05/24/22 1429  BP: 132/88  Pulse: 77  Temp: (!) 97.1 F (36.2 C)  SpO2: 97%  Weight: 121 lb 3.2 oz (55 kg)   Body mass index is 23.67 kg/m. Physical Exam Vitals reviewed.  Constitutional:      Appearance: Normal appearance.  HENT:     Head: Normocephalic.     Nose: Nose normal.     Mouth/Throat:     Mouth: Mucous membranes are moist.     Pharynx: Oropharynx is clear.  Eyes:     Pupils: Pupils are equal, round, and reactive to light.  Cardiovascular:     Rate and Rhythm: Normal rate and regular rhythm.     Pulses: Normal pulses.     Heart sounds: Normal heart sounds. No murmur heard. Pulmonary:     Effort: Pulmonary effort is normal.     Breath sounds: Normal breath sounds.  Abdominal:     General: Abdomen is flat. Bowel sounds are normal.     Palpations: Abdomen is soft.  Musculoskeletal:        General: Swelling present.     Cervical back: Neck supple.  Skin:    General: Skin is warm.  Neurological:     Mental Status: She is alert and oriented to person,  place, and time.  Psychiatric:        Mood and Affect: Mood normal.        Thought Content: Thought content normal.     Labs reviewed: Basic Metabolic Panel: Recent Labs    11/02/21 0000 01/15/22 0000 02/19/22 0000 03/05/22 0000 03/15/22 0000  NA 139   < > 137 137 136*  K 4.2   < > 3.7 3.5 3.7  CL 103   < > 99 98* 97*  CO2  --    < > 30* 32* 34*  BUN 29*   < >  41* 32* 32*  CREATININE 0.9   < > 1.1 0.9 1.0  CALCIUM 9.5   < > 9.3 9.2 9.6  TSH 2.01  --   --   --   --    < > = values in this interval not displayed.   Liver Function Tests: Recent Labs    11/02/21 0000  AST 11*  ALT 8  ALKPHOS 65  ALBUMIN 3.6   No results for input(s): "LIPASE", "AMYLASE" in the last 8760 hours. No results for input(s): "AMMONIA" in the last 8760 hours. CBC: Recent Labs    11/02/21 0000 11/20/21 0000  WBC 6.8 9.2  NEUTROABS 3,012.00 5,373.00  HGB 11.9* 12.1  HCT 36 36  PLT 215 239   Lipid Panel: No results for input(s): "CHOL", "HDL", "LDLCALC", "TRIG", "CHOLHDL", "LDLDIRECT" in the last 8760 hours. No results found for: "HGBA1C"  Procedures since last visit: No results found.  Assessment/Plan 1. Bilateral leg edema Doing well on Metolazone Renal Function stable  2. Essential hypertension On Metoprolol  3. Primary osteoarthritis involving multiple joints Tylenol   4. Dysuria Is oN Elavil per Dr Sabra Heck Not helping her but help her sleep at night Cultures negative Failed Flomax and Pyridium 5. RLS (restless legs syndrome) Requip  6. Gait abnormality Uses Power chair  7. Slow transit constipation Miralax 8 Weight loss Does not like the food and does not want to take Remeron Will continue to monitor   Labs/tests ordered:  * No order type specified *  Virgie Dad, MD

## 2022-06-22 ENCOUNTER — Non-Acute Institutional Stay (SKILLED_NURSING_FACILITY): Payer: PPO | Admitting: Orthopedic Surgery

## 2022-06-22 ENCOUNTER — Encounter: Payer: Self-pay | Admitting: Orthopedic Surgery

## 2022-06-22 DIAGNOSIS — R300 Dysuria: Secondary | ICD-10-CM

## 2022-06-22 DIAGNOSIS — C4492 Squamous cell carcinoma of skin, unspecified: Secondary | ICD-10-CM

## 2022-06-22 DIAGNOSIS — H04123 Dry eye syndrome of bilateral lacrimal glands: Secondary | ICD-10-CM

## 2022-06-22 DIAGNOSIS — R2242 Localized swelling, mass and lump, left lower limb: Secondary | ICD-10-CM | POA: Diagnosis not present

## 2022-06-22 DIAGNOSIS — H6123 Impacted cerumen, bilateral: Secondary | ICD-10-CM | POA: Diagnosis not present

## 2022-06-22 DIAGNOSIS — R269 Unspecified abnormalities of gait and mobility: Secondary | ICD-10-CM

## 2022-06-22 DIAGNOSIS — R600 Localized edema: Secondary | ICD-10-CM

## 2022-06-22 DIAGNOSIS — G2581 Restless legs syndrome: Secondary | ICD-10-CM | POA: Diagnosis not present

## 2022-06-22 DIAGNOSIS — I10 Essential (primary) hypertension: Secondary | ICD-10-CM

## 2022-06-22 DIAGNOSIS — M159 Polyosteoarthritis, unspecified: Secondary | ICD-10-CM | POA: Diagnosis not present

## 2022-06-22 NOTE — Progress Notes (Signed)
Location:  [facility] Room Number: 1/A Place of Service:  SNF [phone]) Provider:  Yvonna Alanis, NP   Virgie Dad, MD  Patient Care Team: Virgie Dad, MD as PCP - General (Internal Medicine) Wardell Honour, MD as Attending Physician [facility])  Extended Emergency Contact Information Primary Emergency Contact: [facility] Address: [place] [address] [address] [place] Phone: [phone] Mobile Phone: [phone] Relation: Son Secondary Emergency Contact: [facility] Address: [address]          [place], [place] [address] [place] Phone: [phone] Relation: Son  Code Status:  DNR Goals of care: Advanced Directive information    04/11/2022    9:52 AM  Advanced Directives  Does Patient Have a Medical Advance Directive? Yes  Type of Advance Directive Out of facility DNR (pink MOST or yellow form);Living will;Healthcare Power of Attorney  Does patient want to make changes to medical advance directive? No - Patient declined  Copy of [place] in Chart? Yes - validated most recent copy scanned in chart (See row information)  Pre-existing out of facility DNR order (yellow form or pink MOST form) Yellow form placed in chart (order not valid for inpatient use);Pink MOST form placed in chart (order not valid for inpatient use)     Chief Complaint  Patient presents with   Medical Management of Chronic Issues    HPI:  Pt is a 88 y.o. female seen today for medical management of chronic diseases.    She continues to reside on the skilled nursing unit at [facility]. Past medical history includes: hypertension, PVD, osteoarthritis of multiple joints, CKD stage 3, posterior tendon dysfunction with right foot drop, constipation, restless leg syndrome and anxiety.   BLE- R>L, given metolazone/potassium twice weekly, not wrapping legs anymore, sometimes elevated legs  in recliner, BUN/creat 31/1.02, K+ 4.1 04/12/2022 HTN-  remains on metoprolol OA- involves right leg/shoulder/back pain, remains on tylenol  Dysuria- ongoing, increased symptoms when she does not drink water well, unsuccessful trial of Uribel 06/2021 and Estrace (breast tenderness), last urine culture inconclusive, not interested in urology consult, remains on amitriptyline(reduced due to nightmares) RLS- remains on requip qhs Gait abnormality- uses power wheelchair, no recent falls or injuries Dry eyes- stable with artificial tears and Genteal ointment  Recent blood pressures:  09/19- 126/64  09/12- 123/87  09/05- 119/84  Recent weights:  09/06- 124/4 lbs  08/01- 121.2 lbs  07/03- 124.6 lbs        Past Medical History:  Diagnosis Date   Anemia    Hypertension    OA (osteoarthritis)    Osteoporosis    Renal cyst    Past Surgical History:  Procedure Laterality Date   APPENDECTOMY     BLADDER SUSPENSION     CATARACT EXTRACTION     CESAREAN SECTION     REPLACEMENT TOTAL KNEE  1995   TONSILLECTOMY      No Known Allergies  Outpatient Encounter Medications as of 06/22/2022  Medication Sig   acetaminophen (TYLENOL) 325 MG tablet Take 650 mg by mouth every 4 (four) hours as needed.   acetaminophen (TYLENOL) 325 MG tablet Take 650 mg by mouth every evening.   amitriptyline (ELAVIL) 10 MG tablet Take 10 mg by mouth at bedtime.   aspirin EC 81 MG tablet Take 81 mg by mouth in the morning. Mondays and Thursdays  Biotin 5000 MCG TABS Take 5,000 mcg by mouth daily.    bisacodyl (DULCOLAX) 10 MG suppository Place 10 mg rectally every other day.   Carboxymethylcell-Hypromellose (GENTEAL) 0.25-0.3 % GEL Apply to eye daily.   Cholecalciferol (VITAMIN D) 50 MCG (2000 UT) CAPS Take 2,000 Units by mouth daily.    magnesium hydroxide (MILK OF MAGNESIA) 400 MG/5ML suspension Take 30 mLs by mouth daily as needed for mild constipation.   metolazone (ZAROXOLYN) 2.5 MG tablet Take 2.5 mg  by mouth 2 (two) times a week.   metoprolol tartrate (LOPRESSOR) 50 MG tablet TAKE 1 TABLET BY MOUTH TWICE DAILY.   multivitamin-lutein (OCUVITE-LUTEIN) CAPS capsule Take 1 capsule by mouth 2 (two) times daily.    polyethylene glycol (MIRALAX / GLYCOLAX) 17 g packet Take 17 g by mouth daily.   polyvinyl alcohol (LIQUIFILM TEARS) 1.4 % ophthalmic solution Place 1 drop into both eyes in the morning, at noon, in the evening, and at bedtime.   potassium chloride SA (KLOR-CON M) 20 MEQ tablet Take 1 tablet (20 mEq total) by mouth 2 (two) times a week. Give with Metolazone   rOPINIRole (REQUIP) 1 MG tablet TAKE 1 TABLET EACH EVENING.   sennosides-docusate sodium (SENOKOT-S) 8.6-50 MG tablet Take 2 tablets by mouth daily as needed.   zinc oxide 20 % ointment Apply 1 application topically as needed for irritation. Special Instructions: To buttocks after every incontinent episode and as needed for redness. May keep at bedside.   No facility-administered encounter medications on file as of 06/22/2022.    Review of Systems  Constitutional:  Negative for activity change, chills, fatigue and fever.  HENT:  Negative for congestion and trouble swallowing.   Eyes:  Negative for photophobia and visual disturbance.  Respiratory:  Negative for cough, shortness of breath and wheezing.   Cardiovascular:  Positive for leg swelling. Negative for chest pain.  Gastrointestinal:  Positive for constipation. Negative for abdominal distention, abdominal pain, diarrhea, nausea and vomiting.  Genitourinary:  Positive for dysuria. Negative for frequency.       Incontinence  Musculoskeletal:  Positive for arthralgias, back pain, gait problem and myalgias.  Skin:  Negative for wound.  Neurological:  Positive for weakness. Negative for dizziness and headaches.  Psychiatric/Behavioral:  Negative for confusion, dysphoric mood and sleep disturbance. The patient is not nervous/anxious.     Immunization History  Administered  Date(s) Administered   Influenza, High Dose Seasonal PF 07/10/2017, 07/15/2019, 07/05/2020, 07/25/2021   Influenza,inj,Quad PF,6+ Mos 07/03/2018   Influenza-Unspecified 07/04/2015, 07/10/2017   Moderna SARS-COV2 Booster Vaccination 03/08/2021   Moderna Sars-Covid-2 Vaccination 10/05/2019, 11/02/2019, 08/15/2020, 06/21/2021   PFIZER(Purple Top)SARS-COV-2 Vaccination 06/21/2021   Pneumococcal Conjugate-13 08/18/2014   Pneumococcal Polysaccharide-23 07/26/2010   Tdap 11/12/2018   Pertinent  Health Maintenance Due  Topic Date Due   INFLUENZA VACCINE  05/01/2022   DEXA SCAN  Discontinued      04/15/2019    1:53 PM 07/15/2019    2:32 PM 07/23/2019   11:24 AM 08/12/2019    2:51 PM 09/29/2021    1:43 PM  Fall Risk  Falls in the past year? 0 0 0 0 1  Was there an injury with Fall? 0  0  0  Fall Risk Category Calculator 0  0  1  Fall Risk Category Low  Low  Low  Patient Fall Risk Level Low fall risk      Patient at Risk for Falls Due to     History of fall(s);Impaired balance/gait;Orthopedic patient  Fall risk Follow up     Falls evaluation completed;Education provided;Falls prevention discussed   Functional Status Survey:    Vitals:   06/22/22 1241  BP: 126/64  Pulse: 70  Resp: 20  Temp: (!) 97.1 F (36.2 C)  SpO2: 94%  Weight: 124 lb 6.4 oz (56.4 kg)  Height: 5' (1.524 m)   Body mass index is 24.3 kg/m. Physical Exam Vitals reviewed.  Constitutional:      General: She is not in acute distress. HENT:     Head: Normocephalic.     Right Ear: There is impacted cerumen.     Left Ear: There is impacted cerumen.     Nose: Nose normal.     Mouth/Throat:     Mouth: Mucous membranes are moist.  Eyes:     General:        Right eye: No discharge.        Left eye: No discharge.  Cardiovascular:     Rate and Rhythm: Normal rate and regular rhythm.     Pulses: Normal pulses.     Heart sounds: Normal heart sounds.  Pulmonary:     Effort: Pulmonary effort is normal. No  respiratory distress.     Breath sounds: Normal breath sounds. No wheezing.  Abdominal:     General: Bowel sounds are normal. There is no distension.     Palpations: Abdomen is soft.     Tenderness: There is no abdominal tenderness.  Musculoskeletal:     Cervical back: Neck supple.     Right lower leg: Edema present.     Left lower leg: Edema present.     Comments: BLE non pitting, right foot drop  Skin:    General: Skin is warm and dry.     Capillary Refill: Capillary refill takes less than 2 seconds.     Comments: Pea sized raised lesion with horn in center, non tender, no drainage, surrounding skin intact. Approx 2 cm raised, fixed mass to left upper shin, non tender or skin breakdown. Approx 1 cm raised lesion, black, irregular shape, no drainage, surrounding skin intact.   Neurological:     General: No focal deficit present.     Mental Status: She is alert.     Motor: Weakness present.     Gait: Gait abnormal.     Comments: PWC  Psychiatric:        Mood and Affect: Mood normal.        Behavior: Behavior normal.     Labs reviewed: Recent Labs    02/19/22 0000 03/05/22 0000 03/15/22 0000  NA 137 137 136*  K 3.7 3.5 3.7  CL 99 98* 97*  CO2 30* 32* 34*  BUN 41* 32* 32*  CREATININE 1.1 0.9 1.0  CALCIUM 9.3 9.2 9.6   Recent Labs    11/02/21 0000  AST 11*  ALT 8  ALKPHOS 65  ALBUMIN 3.6   Recent Labs    11/02/21 0000 11/20/21 0000  WBC 6.8 9.2  NEUTROABS 3,012.00 5,373.00  HGB 11.9* 12.1  HCT 36 36  PLT 215 239   Lab Results  Component Value Date   TSH 2.01 11/02/2021   No results found for: "HGBA1C" Lab Results  Component Value Date   CHOL 175 07/23/2019   HDL 54 07/23/2019   LDLCALC 105 (H) 07/23/2019   TRIG 74 07/23/2019   CHOLHDL 3.2 07/23/2019    Significant Diagnostic Results in last 30 days:  No results found.  Assessment/Plan 1.  Bilateral impacted cerumen - cannot visualize TM due to cerumen - start debrox- 5 gtts qhs x 5 days -  flush ears with warm water when debrox complete  2. 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 Mass of left lower leg - approx 2 cm, raised, fixed, non tender - in house dermatology referral  4. Bilateral leg edema - non pitting - cont metolazone/KCL 2x/week  5. Essential hypertension - controlled - cont metoprolol  6. Primary osteoarthritis involving multiple joints - cont tylenol  7. Dysuria - ongoing - encouraged to drink water - cont amitriptyline ( now 5 mg due to nightmares)  8. RLS (restless legs syndrome) - cont Requip  9. Gait abnormality - does well with PWC- no accidents/injuries - cont skilled nursing care  10. Dry eyes, bilateral - cont artificial tears and Genteal ointment    Family/ staff Communication: plan discussed with patient and nurse  Labs/tests ordered:  none

## 2022-07-05 DIAGNOSIS — D485 Neoplasm of uncertain behavior of skin: Secondary | ICD-10-CM | POA: Diagnosis not present

## 2022-07-05 DIAGNOSIS — L814 Other melanin hyperpigmentation: Secondary | ICD-10-CM | POA: Diagnosis not present

## 2022-07-05 DIAGNOSIS — C44622 Squamous cell carcinoma of skin of right upper limb, including shoulder: Secondary | ICD-10-CM | POA: Diagnosis not present

## 2022-07-05 DIAGNOSIS — Z85828 Personal history of other malignant neoplasm of skin: Secondary | ICD-10-CM | POA: Diagnosis not present

## 2022-07-18 ENCOUNTER — Encounter: Payer: Self-pay | Admitting: Orthopedic Surgery

## 2022-07-18 ENCOUNTER — Non-Acute Institutional Stay (SKILLED_NURSING_FACILITY): Payer: PPO | Admitting: Orthopedic Surgery

## 2022-07-18 DIAGNOSIS — R300 Dysuria: Secondary | ICD-10-CM

## 2022-07-18 DIAGNOSIS — N898 Other specified noninflammatory disorders of vagina: Secondary | ICD-10-CM

## 2022-07-18 DIAGNOSIS — R600 Localized edema: Secondary | ICD-10-CM

## 2022-07-18 DIAGNOSIS — R269 Unspecified abnormalities of gait and mobility: Secondary | ICD-10-CM

## 2022-07-18 DIAGNOSIS — C4492 Squamous cell carcinoma of skin, unspecified: Secondary | ICD-10-CM

## 2022-07-18 DIAGNOSIS — G2581 Restless legs syndrome: Secondary | ICD-10-CM | POA: Diagnosis not present

## 2022-07-18 DIAGNOSIS — I10 Essential (primary) hypertension: Secondary | ICD-10-CM | POA: Diagnosis not present

## 2022-07-18 DIAGNOSIS — M159 Polyosteoarthritis, unspecified: Secondary | ICD-10-CM

## 2022-07-18 DIAGNOSIS — H04123 Dry eye syndrome of bilateral lacrimal glands: Secondary | ICD-10-CM | POA: Diagnosis not present

## 2022-07-18 NOTE — Progress Notes (Signed)
Location:  [place] Room Number: 01/A Place of Service:  SNF [phone]) Provider:  Yvonna Alanis, NP   Virgie Dad, MD  Patient Care Team: Virgie Dad, MD as PCP - General (Internal Medicine) Wardell Honour, MD as Attending Physician [facility] Medicine)  Extended Emergency Contact Information Primary Emergency Contact: [facility] Address: [address] [address] [place] Phone: [phone] Mobile Phone: [phone] Relation: Son Secondary Emergency Contact: [facility] Address: [address]          [place], [place] [address] [place] Phone: [phone] Relation: Son  Code Status:  DNR Goals of care: Advanced Directive information    07/18/2022   10:44 AM  Advanced Directives  Does Patient Have a Medical Advance Directive? Yes  Type of Paramedic of [place];Living will;Out of facility DNR (pink MOST or yellow form)  Does patient want to make changes to medical advance directive? No - Patient declined  Pre-existing out of facility DNR order (yellow form or pink MOST form) Pink Most/Yellow Form available - Physician notified to receive inpatient order     Chief Complaint  Patient presents with   Medical Management of Chronic Issues    HPI:  Pt is a 88 y.o. female seen today for medical management of chronic diseases.    She currently resides on the skilled nursing unit at [facility]. PMH: hypertension, PVD, osteoarthritis of multiple joints, CKD stage 3, posterior tendon dysfunction with right foot drop, constipation, restless leg syndrome and anxiety.    BLE- R>L, given metolazone/potassium twice weekly, unsuccessful trial of leg wrappings, does not always elevate legs in recliner, BUN/creat 32/1.02, K+ 4.1 04/12/2022 HTN-  BUN/creat 32/1.02 04/12/2022, remains on metoprolol OA- involves right leg/shoulder/back pain, remains on tylenol   Dysuria- ongoing, increased symptoms when she does not drink water well, unsuccessful trial of Uribel 06/2021 and Estrace (breast tenderness), last urine culture inconclusive, not interested in urology consult, remains on amitriptyline(reduced due to nightmares) Vaginal dryness- reports increased dryness/irritation qhs, see above, successful trial of Vaseline qhs- requesting order RLS- remains on requip qhs Gait abnormality- uses power wheelchair, no recent falls or injuries Dry eyes- stable with artificial tears and Genteal ointment Squamous cell carcinoma with cutaneous horn- evaluated by dermatology, lesion recently removed, awaiting biopsy results, denies pain  Recent blood pressures:  10/17- 118/65  10/10- 108/79  10/03- 128/64  Recent weights:  10/02- 120.3 lbs  09/06- 124.4 lbs  08/01- 121.2 lbs   Past Medical History:  Diagnosis Date   Anemia    Hypertension    OA (osteoarthritis)    Osteoporosis    Renal cyst    Past Surgical History:  Procedure Laterality Date   APPENDECTOMY     BLADDER SUSPENSION     CATARACT EXTRACTION     CESAREAN SECTION     REPLACEMENT TOTAL KNEE  1995   TONSILLECTOMY      No Known Allergies  Outpatient Encounter Medications as of 07/18/2022  Medication Sig   acetaminophen (TYLENOL) 325 MG tablet Take 650 mg by mouth every 4 (four) hours as needed.   acetaminophen (TYLENOL) 325 MG tablet Take 650 mg by mouth at bedtime.   amitriptyline (ELAVIL) 10 MG tablet Take 5 mg by mouth at bedtime. For Depression   aspirin EC 81 MG tablet Take 81 mg by mouth daily. Mondays and Thursdays   Biotin 5000  MCG TABS Take 5,000 mcg by mouth daily.    bisacodyl (DULCOLAX) 10 MG suppository Place 10 mg rectally every other day. As needed   Cholecalciferol (VITAMIN D) 50 MCG (2000 UT) CAPS Take 2,000 Units by mouth daily.    Eyelid Cleansers (OCUSOFT EYELID CLEANSING) PADS Apply 1 Pad topically every morning.   magnesium hydroxide (MILK OF MAGNESIA) 400  MG/5ML suspension Take 30 mLs by mouth daily as needed for mild constipation.   metolazone (ZAROXOLYN) 2.5 MG tablet Take 2.5 mg by mouth 2 (two) times a week. On Wed. And Sat. only   metoprolol tartrate (LOPRESSOR) 50 MG tablet TAKE 1 TABLET BY MOUTH TWICE DAILY.   multivitamin-lutein (OCUVITE-LUTEIN) CAPS capsule Take 1 capsule by mouth 2 (two) times daily.    polyethylene glycol (MIRALAX / GLYCOLAX) 17 g packet Take 17 g by mouth daily.   polyvinyl alcohol (LIQUIFILM TEARS) 1.4 % ophthalmic solution Place 1 drop into both eyes 3 (three) times daily.   Potassium Chloride ER 20 MEQ TBCR Take 1 tablet by mouth 2 (two) times a week. On Wed. And Sat.   rOPINIRole (REQUIP) 1 MG tablet TAKE 1 TABLET EACH EVENING.   sennosides-docusate sodium (SENOKOT-S) 8.6-50 MG tablet Take 2 tablets by mouth daily. as needed for constipation at bedtime.   White Petrolatum-Mineral Oil (GENTEAL TEARS NIGHT-TIME) OINT Apply to eye as directed. Instill 0.25 inch in both eyes at bedtime for Dry eyes to conjunctival sac   zinc oxide 20 % ointment Apply 1 application topically as needed for irritation. Special Instructions: To buttocks after every incontinent episode and as needed for redness. May keep at bedside.   [DISCONTINUED] Carboxymethylcell-Hypromellose (GENTEAL) 0.25-0.3 % GEL Apply to eye daily.   [DISCONTINUED] potassium chloride SA (KLOR-CON M) 20 MEQ tablet Take 1 tablet (20 mEq total) by mouth 2 (two) times a week. Give with Metolazone   No facility-administered encounter medications on file as of 07/18/2022.    Review of Systems  Constitutional:  Negative for activity change, appetite change, chills, fatigue and fever.  HENT:  Positive for hearing loss. Negative for congestion and trouble swallowing.   Eyes:  Negative for visual disturbance.  Respiratory:  Negative for cough, shortness of breath and wheezing.   Cardiovascular:  Positive for leg swelling. Negative for chest pain.  Gastrointestinal:   Positive for constipation. Negative for abdominal distention, abdominal pain, diarrhea, nausea and vomiting.  Genitourinary:  Positive for dysuria. Negative for frequency and hematuria.       Incontinence  Musculoskeletal:  Positive for gait problem.  Skin:  Positive for wound.  Neurological:  Positive for weakness. Negative for dizziness and headaches.  Psychiatric/Behavioral:  Negative for confusion, dysphoric mood and sleep disturbance. The patient is not nervous/anxious.     Immunization History  Administered Date(s) Administered   Influenza, High Dose Seasonal PF 07/10/2017, 07/15/2019, 07/05/2020, 07/25/2021   Influenza,inj,Quad PF,6+ Mos 07/03/2018   Influenza-Unspecified 07/04/2015, 07/10/2017   Moderna SARS-COV2 Booster Vaccination 03/08/2021   Moderna Sars-Covid-2 Vaccination 10/05/2019, 11/02/2019, 08/15/2020, 06/21/2021   PFIZER(Purple Top)SARS-COV-2 Vaccination 06/21/2021   Pneumococcal Conjugate-13 08/18/2014   Pneumococcal Polysaccharide-23 07/26/2010   Tdap 11/12/2018   Pertinent  Health Maintenance Due  Topic Date Due   INFLUENZA VACCINE  05/01/2022   DEXA SCAN  Discontinued      04/15/2019    1:53 PM 07/15/2019    2:32 PM 07/23/2019   11:24 AM 08/12/2019    2:51 PM 09/29/2021    1:43 PM  Fall Risk  Falls in  the past year? 0 0 0 0 1  Was there an injury with Fall? 0  0  0  Fall Risk Category Calculator 0  0  1  Fall Risk Category Low  Low  Low  Patient Fall Risk Level Low fall risk      Patient at Risk for Falls Due to     History of fall(s);Impaired balance/gait;Orthopedic patient  Fall risk Follow up     Falls evaluation completed;Education provided;Falls prevention discussed   Functional Status Survey:    Vitals:   07/18/22 0946  BP: 118/65  Pulse: 90  Resp: 12  Temp: 97.6 F (36.4 C)  SpO2: 94%  Weight: 120 lb 4.8 oz (54.6 kg)  Height: 5' (1.524 m)   Body mass index is 23.49 kg/m. Physical Exam Vitals reviewed.  Constitutional:       General: She is not in acute distress. HENT:     Head: Normocephalic.     Right Ear: There is no impacted cerumen.     Left Ear: There is no impacted cerumen.     Nose: Nose normal.     Mouth/Throat:     Mouth: Mucous membranes are moist.  Eyes:     General:        Right eye: No discharge.        Left eye: No discharge.  Cardiovascular:     Rate and Rhythm: Normal rate and regular rhythm.     Pulses: Normal pulses.     Heart sounds: Murmur heard.  Pulmonary:     Effort: Pulmonary effort is normal. No respiratory distress.     Breath sounds: Normal breath sounds. No wheezing.  Abdominal:     General: Bowel sounds are normal. There is no distension.     Palpations: Abdomen is soft.     Tenderness: There is no abdominal tenderness.  Genitourinary:    Comments: Unable to examine due to limited mobility Musculoskeletal:     Cervical back: Neck supple.     Right lower leg: Edema present.     Left lower leg: Edema present.     Comments: Right foot drop, non pitting  Skin:    General: Skin is warm.     Capillary Refill: Capillary refill takes less than 2 seconds.     Findings: Lesion present.     Comments: Approx < 1 cm lesion to right anterior forearm with scabbed over appearance, no erythema/swelling/drainage/tenderness, surrounding skin intact.   Neurological:     General: No focal deficit present.     Mental Status: She is alert and oriented to person, place, and time.     Motor: Weakness present.     Gait: Gait abnormal.     Comments: PWC  Psychiatric:        Mood and Affect: Mood normal.        Behavior: Behavior normal.     Labs reviewed: Recent Labs    02/19/22 0000 03/05/22 0000 03/15/22 0000  NA 137 137 136*  K 3.7 3.5 3.7  CL 99 98* 97*  CO2 30* 32* 34*  BUN 41* 32* 32*  CREATININE 1.1 0.9 1.0  CALCIUM 9.3 9.2 9.6   Recent Labs    11/02/21 0000  AST 11*  ALT 8  ALKPHOS 65  ALBUMIN 3.6   Recent Labs    11/02/21 0000 11/20/21 0000  WBC 6.8 9.2   NEUTROABS 3,012.00 5,373.00  HGB 11.9* 12.1  HCT 36 36  PLT 215 239  Lab Results  Component Value Date   TSH 2.01 11/02/2021   No results found for: "HGBA1C" Lab Results  Component Value Date   CHOL 175 07/23/2019   HDL 54 07/23/2019   LDLCALC 105 (H) 07/23/2019   TRIG 74 07/23/2019   CHOLHDL 3.2 07/23/2019    Significant Diagnostic Results in last 30 days:  No results found.  Assessment/Plan 1. Bilateral leg edema - non pitting - cont metolazone/KCL 2x/week  2. Essential hypertension - controlled - cont metoprolol  3. Primary osteoarthritis involving multiple joints - cont tylenol  4. Dysuria - ongoing - unsuccessful trial Uribel/Estrace - symptoms improved with increased water intake - cont amitriptyline   5. Vaginal dryness - see above - trial of Estrace in past- stopped due to breast tenderness - increased irritation qhs- improved with applying Vaseline qhs - start Vaseline- apply to vaginal folds qhs  6. RLS (restless legs syndrome) - cont Requip  7. Gait abnormality - cont skilled nursing   8. Dry eyes, bilateral - cont artifical tears and Genteal ointment  9. 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

## 2022-08-02 DIAGNOSIS — Z85828 Personal history of other malignant neoplasm of skin: Secondary | ICD-10-CM | POA: Diagnosis not present

## 2022-08-02 DIAGNOSIS — D492 Neoplasm of unspecified behavior of bone, soft tissue, and skin: Secondary | ICD-10-CM | POA: Diagnosis not present

## 2022-08-02 DIAGNOSIS — C44622 Squamous cell carcinoma of skin of right upper limb, including shoulder: Secondary | ICD-10-CM | POA: Diagnosis not present

## 2022-08-02 DIAGNOSIS — D692 Other nonthrombocytopenic purpura: Secondary | ICD-10-CM | POA: Diagnosis not present

## 2022-08-30 ENCOUNTER — Non-Acute Institutional Stay (SKILLED_NURSING_FACILITY): Payer: PPO | Admitting: Internal Medicine

## 2022-08-30 DIAGNOSIS — R600 Localized edema: Secondary | ICD-10-CM

## 2022-08-30 DIAGNOSIS — M159 Polyosteoarthritis, unspecified: Secondary | ICD-10-CM | POA: Diagnosis not present

## 2022-08-30 DIAGNOSIS — R309 Painful micturition, unspecified: Secondary | ICD-10-CM

## 2022-08-30 DIAGNOSIS — R269 Unspecified abnormalities of gait and mobility: Secondary | ICD-10-CM

## 2022-08-30 DIAGNOSIS — G2581 Restless legs syndrome: Secondary | ICD-10-CM | POA: Diagnosis not present

## 2022-08-30 DIAGNOSIS — I10 Essential (primary) hypertension: Secondary | ICD-10-CM

## 2022-08-30 DIAGNOSIS — T148XXA Other injury of unspecified body region, initial encounter: Secondary | ICD-10-CM | POA: Diagnosis not present

## 2022-08-30 DIAGNOSIS — M150 Primary generalized (osteo)arthritis: Secondary | ICD-10-CM

## 2022-09-02 ENCOUNTER — Encounter: Payer: Self-pay | Admitting: Internal Medicine

## 2022-09-02 NOTE — Progress Notes (Signed)
Location:  [place] of Service:  SNF (31)  Provider:   Code Status: DNR Goals of Care:     07/18/2022   10:44 AM  Advanced Directives  Does Patient Have a Medical Advance Directive? Yes  Type of Paramedic of [place];Living will;Out of facility DNR (pink MOST or yellow form)  Does patient want to make changes to medical advance directive? No - Patient declined  Pre-existing out of facility DNR order (yellow form or pink MOST form) Pink Most/Yellow Form available - Physician notified to receive inpatient order     Chief Complaint  Patient presents with   Chronic Care Management    HPI: Patient is a 88 y.o. female seen today for medical management of chronic diseases.    Lives in SNF    Patient has h/o Hypertension, Anxiety, Restless leg Syndrome, Insomnia, Arthritis and Posterior Tendon Dysfunction with right foot Valgus in Right Knee with inability to Ambulate    She is stable. No new Nursing issues. No Behavior issues Her weight is stable. Appetite is poor Still c/o Pain With Micturition Multiple Cultures have been negative No Falls Ambulates with Power chair Wt Readings from Last 3 Encounters:  09/02/22 121 lb 6.4 oz (55.1 kg)  07/18/22 120 lb 4.8 oz (54.6 kg)  06/22/22 124 lb 6.4 oz (56.4 kg)   New Skin Tear in Left Leg Wound in her right hand where she had skin Removed for Cancer with some discharge Wound in her Left leg which ? Cancer or Just small cyst Past Medical History:  Diagnosis Date   Anemia    Hypertension    OA (osteoarthritis)    Osteoporosis    Renal cyst     Past Surgical History:  Procedure Laterality Date   APPENDECTOMY     BLADDER SUSPENSION     CATARACT EXTRACTION     CESAREAN SECTION     REPLACEMENT TOTAL KNEE  1995   TONSILLECTOMY      No Known Allergies  Outpatient Encounter Medications as of 08/30/2022  Medication Sig   acetaminophen (TYLENOL) 325 MG tablet Take 650 mg by mouth  every 4 (four) hours as needed.   acetaminophen (TYLENOL) 325 MG tablet Take 650 mg by mouth at bedtime.   amitriptyline (ELAVIL) 10 MG tablet Take 5 mg by mouth at bedtime. For Depression   aspirin EC 81 MG tablet Take 81 mg by mouth daily. Mondays and Thursdays   Biotin 5000 MCG TABS Take 5,000 mcg by mouth daily.    bisacodyl (DULCOLAX) 10 MG suppository Place 10 mg rectally every other day. As needed   Cholecalciferol (VITAMIN D) 50 MCG (2000 UT) CAPS Take 2,000 Units by mouth daily.    Eyelid Cleansers (OCUSOFT EYELID CLEANSING) PADS Apply 1 Pad topically every morning.   magnesium hydroxide (MILK OF MAGNESIA) 400 MG/5ML suspension Take 30 mLs by mouth daily as needed for mild constipation.   metolazone (ZAROXOLYN) 2.5 MG tablet Take 2.5 mg by mouth 2 (two) times a week. On Wed. And Sat. only   metoprolol tartrate (LOPRESSOR) 50 MG tablet TAKE 1 TABLET BY MOUTH TWICE DAILY.   multivitamin-lutein (OCUVITE-LUTEIN) CAPS capsule Take 1 capsule by mouth 2 (two) times daily.    polyethylene glycol (MIRALAX / GLYCOLAX) 17 g packet Take 17 g by mouth daily.   polyvinyl alcohol (LIQUIFILM TEARS) 1.4 % ophthalmic solution Place 1 drop into both eyes 3 (three) times daily.   Potassium Chloride ER 20  MEQ TBCR Take 1 tablet by mouth 2 (two) times a week. On Wed. And Sat.   rOPINIRole (REQUIP) 1 MG tablet TAKE 1 TABLET EACH EVENING.   sennosides-docusate sodium (SENOKOT-S) 8.6-50 MG tablet Take 2 tablets by mouth daily. as needed for constipation at bedtime.   White Petrolatum-Mineral Oil (GENTEAL TEARS NIGHT-TIME) OINT Apply to eye as directed. Instill 0.25 inch in both eyes at bedtime for Dry eyes to conjunctival sac   zinc oxide 20 % ointment Apply 1 application topically as needed for irritation. Special Instructions: To buttocks after every incontinent episode and as needed for redness. May keep at bedside.   No facility-administered encounter medications on file as of 08/30/2022.    Review of  Systems:  Review of Systems  Constitutional:  Positive for appetite change. Negative for activity change.  HENT: Negative.    Respiratory:  Negative for cough and shortness of breath.   Cardiovascular:  Positive for leg swelling.  Gastrointestinal:  Positive for constipation.  Genitourinary: Negative.   Musculoskeletal:  Positive for gait problem. Negative for arthralgias and myalgias.  Skin:  Positive for wound.  Neurological:  Negative for dizziness and weakness.  Psychiatric/Behavioral:  Positive for sleep disturbance. Negative for confusion and dysphoric mood.     Health Maintenance  Topic Date Due   Zoster Vaccines- Shingrix (1 of 2) Never done   INFLUENZA VACCINE  05/01/2022   COVID-19 Vaccine (5 - 2023-24 season) 06/01/2022   Medicare Annual Wellness (AWV)  09/29/2022   DTaP/Tdap/Td (2 - Td or Tdap) 11/12/2028   Pneumonia Vaccine 58+ Years old  Completed   HPV VACCINES  Aged Out   DEXA SCAN  Discontinued    Physical Exam: Vitals:   09/02/22 1839  BP: 124/79  Pulse: 68  Resp: 20  Temp: 97.6 F (36.4 C)  Weight: 121 lb 6.4 oz (55.1 kg)   Body mass index is 23.71 kg/m. Physical Exam Vitals reviewed.  Constitutional:      Appearance: Normal appearance.  HENT:     Head: Normocephalic.     Nose: Nose normal.     Mouth/Throat:     Mouth: Mucous membranes are moist.     Pharynx: Oropharynx is clear.  Eyes:     Pupils: Pupils are equal, round, and reactive to light.  Cardiovascular:     Rate and Rhythm: Normal rate and regular rhythm.     Pulses: Normal pulses.     Heart sounds: Murmur heard.  Pulmonary:     Effort: Pulmonary effort is normal.     Breath sounds: Normal breath sounds.  Abdominal:     General: Abdomen is flat. Bowel sounds are normal.     Palpations: Abdomen is soft.  Musculoskeletal:        General: Swelling present.     Cervical back: Neck supple.  Skin:    General: Skin is warm.     Comments: Right hand wound Had Biopsy done there with  removal for cancer It has clear base some discharge and odor But not infected or red or tender  Skin tear in Left leg   Neurological:     Mental Status: She is alert and oriented to person, place, and time.  Psychiatric:        Mood and Affect: Mood normal.        Thought Content: Thought content normal.     Labs reviewed: Basic Metabolic Panel: Recent Labs    11/02/21 0000 01/15/22 0000 02/19/22 0000 03/05/22 0000 03/15/22  0000  NA 139   < > 137 137 136*  K 4.2   < > 3.7 3.5 3.7  CL 103   < > 99 98* 97*  CO2  --    < > 30* 32* 34*  BUN 29*   < > 41* 32* 32*  CREATININE 0.9   < > 1.1 0.9 1.0  CALCIUM 9.5   < > 9.3 9.2 9.6  TSH 2.01  --   --   --   --    < > = values in this interval not displayed.   Liver Function Tests: Recent Labs    11/02/21 0000  AST 11*  ALT 8  ALKPHOS 65  ALBUMIN 3.6   No results for input(s): "LIPASE", "AMYLASE" in the last 8760 hours. No results for input(s): "AMMONIA" in the last 8760 hours. CBC: Recent Labs    11/02/21 0000 11/20/21 0000  WBC 6.8 9.2  NEUTROABS 3,012.00 5,373.00  HGB 11.9* 12.1  HCT 36 36  PLT 215 239   Lipid Panel: No results for input(s): "CHOL", "HDL", "LDLCALC", "TRIG", "CHOLHDL", "LDLDIRECT" in the last 8760 hours. No results found for: "HGBA1C"  Procedures since last visit: No results found.  Assessment/Plan 1. Bilateral leg edema Doing well on Metolazone  2. Essential hypertension Controlled on Metoprolol  3. Primary osteoarthritis involving multiple joints Tylenol  4. Painful micturition Multiple Cultures negative nO more work up  5. RLS (restless legs syndrome) On Requip  6. Gait abnormality Uses Power chair  7. Multiple wounds of skin Discussed with Facility Nurse Foam dressing right now No Signs of infection    Labs/tests ordered:  * No order type specified * Next appt:  Visit date not found

## 2022-09-06 DIAGNOSIS — Z85828 Personal history of other malignant neoplasm of skin: Secondary | ICD-10-CM | POA: Diagnosis not present

## 2022-09-06 DIAGNOSIS — D692 Other nonthrombocytopenic purpura: Secondary | ICD-10-CM | POA: Diagnosis not present

## 2022-09-06 DIAGNOSIS — L570 Actinic keratosis: Secondary | ICD-10-CM | POA: Diagnosis not present

## 2022-09-06 DIAGNOSIS — L853 Xerosis cutis: Secondary | ICD-10-CM | POA: Diagnosis not present

## 2022-09-06 DIAGNOSIS — L814 Other melanin hyperpigmentation: Secondary | ICD-10-CM | POA: Diagnosis not present

## 2022-09-06 DIAGNOSIS — L821 Other seborrheic keratosis: Secondary | ICD-10-CM | POA: Diagnosis not present

## 2022-09-06 DIAGNOSIS — S80811A Abrasion, right lower leg, initial encounter: Secondary | ICD-10-CM | POA: Diagnosis not present

## 2022-09-06 DIAGNOSIS — D492 Neoplasm of unspecified behavior of bone, soft tissue, and skin: Secondary | ICD-10-CM | POA: Diagnosis not present

## 2022-09-07 ENCOUNTER — Non-Acute Institutional Stay (SKILLED_NURSING_FACILITY): Payer: PPO | Admitting: Orthopedic Surgery

## 2022-09-07 ENCOUNTER — Encounter: Payer: Self-pay | Admitting: Orthopedic Surgery

## 2022-09-07 DIAGNOSIS — R300 Dysuria: Secondary | ICD-10-CM | POA: Diagnosis not present

## 2022-09-07 MED ORDER — PHENAZOPYRIDINE HCL 100 MG PO TABS: 100.0000 mg | ORAL_TABLET | Freq: Every day | ORAL | 0 refills | Status: AC | PRN

## 2022-09-07 NOTE — Progress Notes (Signed)
Location:  [place] Room Number: 01/A Place of Service:  SNF [phone]) Provider:  Yvonna Alanis, NP   Virgie Dad, MD  Patient Care Team: Virgie Dad, MD as PCP - General (Internal Medicine) Wardell Honour, MD as Attending Physician [facility] Medicine)  Extended Emergency Contact Information Primary Emergency Contact: [facility] Address: [place] [address] [place] Phone: [phone] Mobile Phone: [phone] Relation: Son Secondary Emergency Contact: [facility] Address: [address]          [place], [place] [address] [place] Phone: [phone] Relation: Son  Code Status:  DNR Goals of care: Advanced Directive information    07/18/2022   10:44 AM  Advanced Directives  Does Patient Have a Medical Advance Directive? Yes  Type of Paramedic of [place];Living will;Out of facility DNR (pink MOST or yellow form)  Does patient want to make changes to medical advance directive? No - Patient declined  Pre-existing out of facility DNR order (yellow form or pink MOST form) Pink Most/Yellow Form available - Physician notified to receive inpatient order     Chief Complaint  Patient presents with   Acute Visit    dysuria    HPI:  Pt is a 88 y.o. female seen today for acute visit due to dysuria.   She currently resides on the skilled nursing unit at [facility]. PMH: hypertension, PVD, osteoarthritis of multiple joints, CKD stage 3, posterior tendon dysfunction with right foot drop, constipation, restless leg syndrome and anxiety.    H/o of cystitis and incontinence. Failed trial of Uribel 06/2021 and Estrace (breast tenderness). Past urine cultures negative for growth. She is not interested in urology consult due to advanced age and potential discomfort. She is currently on Amitriptyline, reduced > 8 weeks ago due to bad dream. She does not drink  fluids well. Afebrile. Vitals stable.    Past Medical History:  Diagnosis Date   Anemia    Hypertension    OA (osteoarthritis)    Osteoporosis    Renal cyst    Past Surgical History:  Procedure Laterality Date   APPENDECTOMY     BLADDER SUSPENSION     CATARACT EXTRACTION     CESAREAN SECTION     REPLACEMENT TOTAL KNEE  1995   TONSILLECTOMY      No Known Allergies  Outpatient Encounter Medications as of 09/07/2022  Medication Sig   acetaminophen (TYLENOL) 325 MG tablet Take 650 mg by mouth every 4 (four) hours as needed.   acetaminophen (TYLENOL) 325 MG tablet Take 650 mg by mouth at bedtime.   amitriptyline (ELAVIL) 10 MG tablet Take 5 mg by mouth at bedtime. For Depression   aspirin EC 81 MG tablet Take 81 mg by mouth daily. Mondays and Thursdays   Biotin 5000 MCG TABS Take 5,000 mcg by mouth daily.    bisacodyl (DULCOLAX) 10 MG suppository Place 10 mg rectally every other day. As needed   Cholecalciferol (VITAMIN D) 50 MCG (2000 UT) CAPS Take 2,000 Units by mouth daily.    Eyelid Cleansers (OCUSOFT EYELID CLEANSING) PADS Apply 1 Pad topically every morning.   magnesium hydroxide (MILK OF MAGNESIA) 400 MG/5ML suspension Take 30 mLs by mouth daily as needed for mild constipation.   metolazone (ZAROXOLYN) 2.5 MG tablet Take 2.5 mg by mouth 2 (two) times a week. On Wed. And Sat. only  metoprolol tartrate (LOPRESSOR) 50 MG tablet TAKE 1 TABLET BY MOUTH TWICE DAILY.   multivitamin-lutein (OCUVITE-LUTEIN) CAPS capsule Take 1 capsule by mouth 2 (two) times daily.    polyethylene glycol (MIRALAX / GLYCOLAX) 17 g packet Take 17 g by mouth daily.   polyvinyl alcohol (LIQUIFILM TEARS) 1.4 % ophthalmic solution Place 1 drop into both eyes 3 (three) times daily.   Potassium Chloride ER 20 MEQ TBCR Take 1 tablet by mouth 2 (two) times a week. On Wed. And Sat.   rOPINIRole (REQUIP) 1 MG tablet TAKE 1 TABLET EACH EVENING.   sennosides-docusate sodium (SENOKOT-S) 8.6-50 MG tablet Take 2  tablets by mouth daily. as needed for constipation at bedtime.   White Petrolatum-Mineral Oil (GENTEAL TEARS NIGHT-TIME) OINT Apply to eye as directed. Instill 0.25 inch in both eyes at bedtime for Dry eyes to conjunctival sac   zinc oxide 20 % ointment Apply 1 application topically as needed for irritation. Special Instructions: To buttocks after every incontinent episode and as needed for redness. May keep at bedside.   No facility-administered encounter medications on file as of 09/07/2022.    Review of Systems  Constitutional:  Negative for activity change, appetite change, fatigue and fever.  Respiratory:  Negative for cough, shortness of breath and wheezing.   Cardiovascular:  Negative for chest pain and leg swelling.  Gastrointestinal:  Negative for abdominal pain, constipation, diarrhea and nausea.  Genitourinary:  Positive for dysuria. Negative for frequency and hematuria.       Incontinence  Psychiatric/Behavioral:  Negative for dysphoric mood. The patient is not nervous/anxious.     Immunization History  Administered Date(s) Administered   Influenza, High Dose Seasonal PF 07/10/2017, 07/15/2019, 07/05/2020, 07/25/2021   Influenza,inj,Quad PF,6+ Mos 07/03/2018   Influenza-Unspecified 07/04/2015, 07/10/2017   Moderna SARS-COV2 Booster Vaccination 03/08/2021   Moderna Sars-Covid-2 Vaccination 10/05/2019, 11/02/2019, 08/15/2020, 06/21/2021   PFIZER(Purple Top)SARS-COV-2 Vaccination 06/21/2021   Pneumococcal Conjugate-13 08/18/2014   Pneumococcal Polysaccharide-23 07/26/2010   Tdap 11/12/2018   Pertinent  Health Maintenance Due  Topic Date Due   INFLUENZA VACCINE  05/01/2022   DEXA SCAN  Discontinued      04/15/2019    1:53 PM 07/15/2019    2:32 PM 07/23/2019   11:24 AM 08/12/2019    2:51 PM 09/29/2021    1:43 PM  Fall Risk  Falls in the past year? 0 0 0 0 1  Was there an injury with Fall? 0  0  0  Fall Risk Category Calculator 0  0  1  Fall Risk Category Low  Low   Low  Patient Fall Risk Level Low fall risk      Patient at Risk for Falls Due to     History of fall(s);Impaired balance/gait;Orthopedic patient  Fall risk Follow up     Falls evaluation completed;Education provided;Falls prevention discussed   Functional Status Survey:    Vitals:   09/07/22 1357  BP: 128/67  Pulse: 70  Resp: 20  Temp: 97.6 F (36.4 C)  SpO2: 93%  Weight: 122 lb (55.3 kg)  Height: 5' (1.524 m)   Body mass index is 23.83 kg/m. Physical Exam Vitals reviewed.  Constitutional:      General: She is not in acute distress. HENT:     Head: Normocephalic.  Eyes:     General:        Right eye: No discharge.        Left eye: No discharge.  Cardiovascular:     Rate and Rhythm:  Normal rate and regular rhythm.     Pulses: Normal pulses.     Heart sounds: Normal heart sounds.  Pulmonary:     Effort: Pulmonary effort is normal. No respiratory distress.     Breath sounds: Normal breath sounds. No wheezing.  Abdominal:     General: Bowel sounds are normal. There is no distension.     Palpations: Abdomen is soft.     Tenderness: There is no abdominal tenderness. There is no right CVA tenderness or left CVA tenderness.  Musculoskeletal:     Cervical back: Neck supple.     Right lower leg: No edema.     Left lower leg: No edema.  Skin:    General: Skin is warm and dry.     Capillary Refill: Capillary refill takes less than 2 seconds.  Neurological:     General: No focal deficit present.     Mental Status: She is alert and oriented to person, place, and time.     Motor: Weakness present.     Gait: Gait abnormal.  Psychiatric:        Mood and Affect: Mood normal.        Behavior: Behavior normal.     Labs reviewed: Recent Labs    02/19/22 0000 03/05/22 0000 03/15/22 0000  NA 137 137 136*  K 3.7 3.5 3.7  CL 99 98* 97*  CO2 30* 32* 34*  BUN 41* 32* 32*  CREATININE 1.1 0.9 1.0  CALCIUM 9.3 9.2 9.6   Recent Labs    11/02/21 0000  AST 11*  ALT 8   ALKPHOS 65  ALBUMIN 3.6   Recent Labs    11/02/21 0000 11/20/21 0000  WBC 6.8 9.2  NEUTROABS 3,012.00 5,373.00  HGB 11.9* 12.1  HCT 36 36  PLT 215 239   Lab Results  Component Value Date   TSH 2.01 11/02/2021   No results found for: "HGBA1C" Lab Results  Component Value Date   CHOL 175 07/23/2019   HDL 54 07/23/2019   LDLCALC 105 (H) 07/23/2019   TRIG 74 07/23/2019   CHOLHDL 3.2 07/23/2019    Significant Diagnostic Results in last 30 days:  No results found.  Assessment/Plan 1. Dysuria - ongoing - afebrile, no cva pain, malaise - unsuccessful trial Uribel and Estrace in past - urine cultures negative in past - will increase amitriptyline back to 10 mg qhs - start pyridium 100 mg po daily prn x 1 month - encouraged to drink more water daily    Family/ staff Communication: plan discussed with patient and nurse  Labs/tests ordered:  none

## 2022-09-14 DIAGNOSIS — M79671 Pain in right foot: Secondary | ICD-10-CM | POA: Diagnosis not present

## 2022-09-14 DIAGNOSIS — M79672 Pain in left foot: Secondary | ICD-10-CM | POA: Diagnosis not present

## 2022-09-14 DIAGNOSIS — L84 Corns and callosities: Secondary | ICD-10-CM | POA: Diagnosis not present

## 2022-09-14 DIAGNOSIS — B351 Tinea unguium: Secondary | ICD-10-CM | POA: Diagnosis not present

## 2022-09-26 ENCOUNTER — Non-Acute Institutional Stay (SKILLED_NURSING_FACILITY): Payer: PPO | Admitting: Orthopedic Surgery

## 2022-09-26 ENCOUNTER — Encounter: Payer: Self-pay | Admitting: Orthopedic Surgery

## 2022-09-26 ENCOUNTER — Encounter: Payer: Self-pay | Admitting: Family Medicine

## 2022-09-26 ENCOUNTER — Non-Acute Institutional Stay: Payer: PPO | Admitting: Family Medicine

## 2022-09-26 VITALS — BP 126/60 | HR 68 | Temp 97.8°F | Resp 18

## 2022-09-26 DIAGNOSIS — I10 Essential (primary) hypertension: Secondary | ICD-10-CM | POA: Diagnosis not present

## 2022-09-26 DIAGNOSIS — D4989 Neoplasm of unspecified behavior of other specified sites: Secondary | ICD-10-CM | POA: Diagnosis not present

## 2022-09-26 DIAGNOSIS — M159 Polyosteoarthritis, unspecified: Secondary | ICD-10-CM | POA: Diagnosis not present

## 2022-09-26 DIAGNOSIS — N951 Menopausal and female climacteric states: Secondary | ICD-10-CM | POA: Diagnosis not present

## 2022-09-26 DIAGNOSIS — R600 Localized edema: Secondary | ICD-10-CM

## 2022-09-26 DIAGNOSIS — R309 Painful micturition, unspecified: Secondary | ICD-10-CM | POA: Diagnosis not present

## 2022-09-26 DIAGNOSIS — G2581 Restless legs syndrome: Secondary | ICD-10-CM | POA: Diagnosis not present

## 2022-09-26 DIAGNOSIS — R269 Unspecified abnormalities of gait and mobility: Secondary | ICD-10-CM

## 2022-09-26 DIAGNOSIS — H04123 Dry eye syndrome of bilateral lacrimal glands: Secondary | ICD-10-CM

## 2022-09-26 DIAGNOSIS — N898 Other specified noninflammatory disorders of vagina: Secondary | ICD-10-CM

## 2022-09-26 NOTE — Progress Notes (Signed)
Location:  [place] Room Number: [place] of Service:  SNF [phone]) Provider:  Jaleeyah Munce Arna Snipe, MD  Patient Care Team: Virgie Dad, MD as PCP - General (Internal Medicine) Wardell Honour, MD as Attending Physician [facility] Medicine)  Extended Emergency Contact Information Primary Emergency Contact: [facility] Address: [place] [address] [place] Phone: [phone] Mobile Phone: [phone] Relation: Son Secondary Emergency Contact: [facility] Address: [address]          [place], [place] [address] [place] Phone: [phone] Relation: Son  Code Status:  DNR Palliative Care  Goals of care: Advanced Directive information    09/26/2022    1:49 PM  Advanced Directives  Does Patient Have a Medical Advance Directive? Yes  Type of Paramedic of [place];Out of facility DNR (pink MOST or yellow form)  Does patient want to make changes to medical advance directive? No - Patient declined  Copy of [place] in Chart? Yes - validated most recent copy scanned in chart (See row information)     Chief Complaint  Patient presents with   Medical Management of Chronic Issues    Routine Visit   Quality Metric Gaps    Discussed the need for AWV and shingles vaccine     HPI:  Pt is a 88 y.o. female seen today for medical management of chronic diseases.    She currently resides on the skilled nursing unit at [facility]. PMH: hypertension, PVD, osteoarthritis of multiple joints, CKD stage 3, posterior tendon dysfunction with right foot drop, constipation, restless leg syndrome and anxiety.   Vaginal dryness- reports increased dryness/irritation, trial of vaseline to folds helpful- requesting another order Painful micturition/ Dysuria- ongoing, increased symptoms when she does not drink water well, unsuccessful trial of  Uribel 06/2021 and Estrace (breast tenderness), last urine culture inconclusive, not interested in urology consult, remains on amitriptyline and pyridium prn BLE- R>L, given metolazone/potassium twice weekly, unsuccessful trial of leg wrappings, does not always elevate legs in recliner, BUN/creat 32/1.02, K+ 4.1 04/12/2022 HTN-  BUN/creat 32/1.02 04/12/2022, remains on metoprolol OA- involves right leg/shoulder/back pain, remains on tylenol  RLS- remains on requip qhs Gait abnormality- uses power wheelchair, no recent falls or injuries Dry eyes- stable with artificial tears and Genteal ointment 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   acetaminophen (TYLENOL) 325 MG tablet Take 650 mg by mouth at bedtime.   aspirin EC 81 MG tablet Take 81 mg by mouth daily. Mondays and Thursdays   Biotin 5000 MCG TABS Take 5,000 mcg by mouth daily.    bisacodyl (DULCOLAX) 10 MG suppository Place 10 mg rectally every other day. As needed   Cholecalciferol (VITAMIN D) 50 MCG (2000 UT) CAPS Take 2,000  Units by mouth daily.    Eyelid Cleansers (OCUSOFT EYELID CLEANSING) PADS Apply 1 Pad topically every morning.   magnesium hydroxide (MILK OF MAGNESIA) 400 MG/5ML suspension Take 30 mLs by mouth daily as needed for mild constipation.   metoprolol tartrate (LOPRESSOR) 50 MG tablet TAKE 1 TABLET BY MOUTH TWICE DAILY.   multivitamin-lutein (OCUVITE-LUTEIN) CAPS capsule Take 1 capsule by mouth  2 (two) times daily.    phenazopyridine (PYRIDIUM) 100 MG tablet Take 1 tablet (100 mg total) by mouth daily as needed for pain.   polyethylene glycol (MIRALAX / GLYCOLAX) 17 g packet Take 17 g by mouth daily.   polyvinyl alcohol (LIQUIFILM TEARS) 1.4 % ophthalmic solution Place 1 drop into both eyes 3 (three) times daily.   Potassium Chloride ER 20 MEQ TBCR Take 1 tablet by mouth 2 (two) times a week. On Wed. And Sat.   rOPINIRole (REQUIP) 1 MG tablet TAKE 1 TABLET EACH EVENING.   sennosides-docusate sodium (SENOKOT-S) 8.6-50 MG tablet Take 2 tablets by mouth daily. as needed for constipation at bedtime.   White Petrolatum-Mineral Oil (GENTEAL TEARS NIGHT-TIME) OINT Apply to eye as directed. Instill 0.25 inch in both eyes at bedtime for Dry eyes to conjunctival sac   zinc oxide 20 % ointment Apply 1 application topically as needed for irritation. Special Instructions: To buttocks after every incontinent episode and as needed for redness. May keep at bedside.   amitriptyline (ELAVIL) 10 MG tablet Take 10 mg by mouth at bedtime. For Depression (Patient not taking: Reported on 09/26/2022)   metolazone (ZAROXOLYN) 2.5 MG tablet Take 2.5 mg by mouth 2 (two) times a week. On Wed. And Sat. only (Patient not taking: Reported on 09/26/2022)   No facility-administered encounter medications on file as of 09/26/2022.    Review of Systems  Constitutional:  Negative for activity change, appetite change, fatigue and fever.  HENT:  Negative for congestion and trouble swallowing.   Eyes:  Negative for visual disturbance.  Respiratory:  Negative for cough, shortness of breath and wheezing.   Cardiovascular:  Positive for leg swelling. Negative for chest pain.  Gastrointestinal:  Positive for constipation. Negative for abdominal distention, abdominal pain, diarrhea, nausea and vomiting.  Genitourinary:  Positive for dysuria and frequency. Negative for hematuria, vaginal bleeding and vaginal discharge.        Incontinence B&B  Musculoskeletal:  Positive for arthralgias and gait problem.  Neurological:  Positive for tremors and weakness. Negative for dizziness and headaches.  Psychiatric/Behavioral:  Positive for confusion. Negative for dysphoric mood and sleep disturbance. The patient is not nervous/anxious.     Immunization History  Administered Date(s) Administered   Covid-19,MRNA Vaccine(Spikevax)63mo. thru 182yr11/03/2022   Fluad Quad(high Dose 65+) 07/25/2022   Influenza, High Dose Seasonal PF 07/10/2017, 07/15/2019, 07/05/2020, 07/25/2021   Influenza,inj,Quad PF,6+ Mos 07/03/2018   Influenza-Unspecified 07/04/2015, 07/10/2017   Moderna SARS-COV2 Booster Vaccination 03/08/2021, 03/14/2022   Moderna Sars-Covid-2 Vaccination 10/05/2019, 11/02/2019, 08/15/2020, 06/21/2021   PFIZER(Purple Top)SARS-COV-2 Vaccination 06/21/2021   Pneumococcal Conjugate-13 08/18/2014   Pneumococcal Polysaccharide-23 07/26/2010   Tdap 11/12/2018   Pertinent  Health Maintenance Due  Topic Date Due   INFLUENZA VACCINE  Completed   DEXA SCAN  Discontinued      04/15/2019    1:53 PM 07/15/2019    2:32 PM 07/23/2019   11:24 AM 08/12/2019    2:51 PM 09/29/2021    1:43 PM  FaPetersn the past year? 0 0 0 0 1  Was there an  injury with Fall? 0  0  0  Fall Risk Category Calculator 0  0  1  Fall Risk Category Low  Low  Low  Patient Fall Risk Level Low fall risk      Patient at Risk for Falls Due to     History of fall(s);Impaired balance/gait;Orthopedic patient  Fall risk Follow up     Falls evaluation completed;Education provided;Falls prevention discussed   Functional Status Survey:    Vitals:   09/26/22 1345  BP: 108/77  Pulse: 96  Resp: (!) 21  Temp: (!) 97.1 F (36.2 C)  TempSrc: Temporal  SpO2: (!) 74%  Weight: 122 lb (55.3 kg)  Height: 5' (1.524 m)   Body mass index is 23.83 kg/m. Physical Exam Vitals reviewed.  Constitutional:      General: She is not in acute  distress. HENT:     Head: Normocephalic.     Right Ear: There is no impacted cerumen.     Left Ear: There is no impacted cerumen.     Nose: Nose normal.     Mouth/Throat:     Mouth: Mucous membranes are moist.  Eyes:     General:        Right eye: No discharge.        Left eye: No discharge.  Cardiovascular:     Rate and Rhythm: Normal rate and regular rhythm.     Pulses: Normal pulses.     Heart sounds: Normal heart sounds.  Pulmonary:     Effort: Pulmonary effort is normal. No respiratory distress.     Breath sounds: Normal breath sounds. No wheezing.  Abdominal:     General: Bowel sounds are normal. There is no distension.     Palpations: Abdomen is soft.     Tenderness: There is no abdominal tenderness.  Musculoskeletal:     Cervical back: Neck supple.     Right lower leg: Edema present.     Left lower leg: Edema present.     Comments: Non pitting  Skin:    General: Skin is warm and dry.     Capillary Refill: Capillary refill takes less than 2 seconds.     Comments: Approx 1 cm raised nontender nodule to right mid lateral leg   Neurological:     General: No focal deficit present.     Mental Status: She is alert. Mental status is at baseline.     Motor: Weakness present.     Gait: Gait abnormal.     Comments: PWC, resting hand tremor L>R  Psychiatric:        Mood and Affect: Mood normal.        Behavior: Behavior normal.     Labs reviewed: Recent Labs    02/19/22 0000 03/05/22 0000 03/15/22 0000  NA 137 137 136*  K 3.7 3.5 3.7  CL 99 98* 97*  CO2 30* 32* 34*  BUN 41* 32* 32*  CREATININE 1.1 0.9 1.0  CALCIUM 9.3 9.2 9.6   Recent Labs    11/02/21 0000  AST 11*  ALT 8  ALKPHOS 65  ALBUMIN 3.6   Recent Labs    11/02/21 0000 11/20/21 0000  WBC 6.8 9.2  NEUTROABS 3,012.00 5,373.00  HGB 11.9* 12.1  HCT 36 36  PLT 215 239   Lab Results  Component Value Date   TSH 2.01 11/02/2021   No results found for: "HGBA1C" Lab Results  Component Value  Date   CHOL 175 07/23/2019  HDL 54 07/23/2019   LDLCALC 105 (H) 07/23/2019   TRIG 74 07/23/2019   CHOLHDL 3.2 07/23/2019    Significant Diagnostic Results in last 30 days:  No results found.  Assessment/Plan 1. Vaginal dryness - ongoing - unsuccessful trial of Estrace d/t breast tenderness - some improvement with vaseline - will schedule vaseline applications to vaginal folds BID x 1 month  2. Painful micturition - see above - cont amitriptyline and pyridium prn - encourage hydration with water  3. Bilateral leg edema - nonpitting - cont metolazone  4. Essential hypertension - controlled without medication  5. Primary osteoarthritis involving multiple joints - cont tylenol  6. RLS (restless legs syndrome) - cont Requip  7. Gait abnormality - cont skilled nursing  8. Dry eyes, bilateral - cont Artificial tears  9. Neoplasm of lower leg - refused biopsy recommendation from dermatology 09/06/2022    Family/ staff Communication: plan discussed with patient and nurse  Labs/tests ordered:  cbc/diff, cmp 10/04/2022

## 2022-09-26 NOTE — Progress Notes (Signed)
[facility] Palliative Care Consult Note Telephone: [phone]  Fax: [phone]    Date of encounter: 09/26/22 11:53 AM PATIENT NAME: Ann Jensen [place] [address]   [phone] (home)  DOB: 04-Dec-1921 MRN: [id number] PRIMARY CARE PROVIDER:    Virgie Dad, MD,  [place] [address] [phone]  REFERRING PROVIDER:   Virgie Dad, MD [address] [place],  [place] [address] [phone]  RESPONSIBLE PARTY:    Contact Information     Name Relation Home Work [place] Son [place]   Ann Jensen, [place] [phone]          I met face to face with patient and family in [facility]. Palliative Care was asked to follow this patient by consultation request of  Ann Dad, MD to address advance care planning and complex medical decision making. This is a follow up visit   ASSESSMENT , SYMPTOM MANAGEMENT AND PLAN / RECOMMENDATIONS:    Primary OA of right knee with posterior tibial tendon dysfunction s/p right TKR in 1995/Gait instability Likely right TKR is at end of expected function given almost 28 years since replacement.  Complicated by posterior tibial dysfunction resulting in knee instability and pain. Pt not a good candidate for surgical revision given her advanced age. Recommend continuation of Acetaminophen 650 mg QHS and follow up with either 650 mg Q 6 hrs prn or change to 500 mg Q 4hrs with one guaranteed dose at HS to keep daily maximum Tylenol use < 3 gm.  2.  Menopausal vaginal dryness Likely multifactorial related to atrophy, dryness and increased pH. 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 Agree that Elavil 10 mg QHS may help, would also recommend vaginal moisturizer like pure coconut or olive oil if pt has no allergy as this may help with dryness. Can also use  suppositories like Revaree Q 2-3 days to balance pH and reduce dryness.   Advance Care Planning/Goals of Care: Goals include to maximize quality of life and symptom management.  Identification of a healthcare agent -son, Ann Gamm, MD Review of an advance directive document-MOST and DNR. Decision not to resuscitate or to de-escalate disease focused treatments due to poor prognosis. CODE STATUS: MOST as of 11/18/2018: DNR/DNI with comfort measures Use of antibiotics and IV fluids on a case by case, time limited basis No feeding tube.     Follow up Palliative Care Visit: Palliative care will continue to follow for complex medical decision making, advance care planning, and clarification of goals. Return 6-8 weeks or prn.    This visit was coded based on medical decision making (MDM).  PPS: 50%  HOSPICE ELIGIBILITY/DIAGNOSIS: TBD  Chief Complaint:  Palliative Care is continuing to follow patient for chronic medical management in setting of HTN, PVD and CKD with osteoporosis and being unable to weight bear on RLE.   HISTORY OF PRESENT ILLNESS:  Ann Jensen is a 88 y.o. year old female with HTN, PVD, constipation, posterior tibial tendon dysfunction on the right, osteoporosis, OA of multiple joints, CKD stage 3, insomnia secondary to depression/anxiety, RLS, anemia, HLD, generalized weakness/gait abnormality, dysuria, right thigh pain and elevated IOP.  Spoke with Ann Jensen, facility NP who states pt has chronic c/o dysuria.  She has tried Estrace with her and noted that she had breast tenderness.  She has treated with antifungal treatment and does not have  UTI.  She is trying 1 month of Pyridium daily use to see if it will help complaints. Saw improvement in vaginal pain c/o's with use of vaseline to labia/perineum. Pt states she has chronic edema and pain in her right knee. States she had a right total knee joint replacement and had no noted complications but all of a sudden her knee  just gave way.  She currently uses a scooter for mobility and has to have a lift for transfers.  Denies CP, SOB, nausea and vomiting.  Pt reports feeling like she does not belong in skilled nursing but "in Palliative care".    History obtained from review of EMR, discussion with primary team, and interview with facility staff and/or Ann Jensen.      Latest Ref Rng & Units 11/20/2021   12:00 AM 11/02/2021   12:00 AM 04/15/2021   12:00 AM  CBC  WBC  9.2     6.8     8.0      Hemoglobin 12.0 - 16.0 12.1     11.9     11.3      Hematocrit 36 - 46 36     36     34      Platelets 150 - 399 239     215     196         This result is from an external source.       Latest Ref Rng & Units 03/15/2022   12:00 AM 03/05/2022   12:00 AM 02/19/2022   12:00 AM  CMP  BUN 4 - 21 32     32     41      Creatinine 0.5 - 1.1 1.0     0.9     1.1      Sodium 137 - 147 136     137     137      Potassium 3.5 - 5.1 mEq/L 3.7     3.5     3.7      Chloride 99 - 108 97     98     99      CO2 13 - 22 34     32     30      Calcium 8.7 - 10.7 9.6     9.2     9.3         This result is from an external source.       Latest Ref Rng & Units 11/02/2021   12:00 AM 04/15/2021   12:00 AM 09/20/2020   12:00 AM  Hepatic Function  Albumin 3.5 - 5.0 3.6     3.1     3.5   AST 13 - 35 _0 ALT 7 - 35 _1 Alk Phosphatase 25 - 125 65     58     61   Bilirubin, Direct 0.01 - 0.4  0.1          This result is from an external source.    I reviewed EMR for available labs, medications, imaging, studies and related documents.  Records reviewed and summarized above.   ROS General: NAD Cardiovascular: denies chest pain, denies DOE Pulmonary: denies cough, denies SOB Abdomen: endorses good appetite, has constipation and staff reports incontinence of bowel GU: endorses dysuria and vaginal pain,  staff reports incontinence of bladder MSK:  BLE weakness,  no falls reported, wc bound Skin: denies rashes or  wounds Neurological: endorses chronic right knee pain and dysuria, denies insomnia Psych: Endorses positive mood Heme/lymph/immuno: denies bruises, abnormal bleeding  Physical Exam: Current and past weights: As if 09/06/22 weight 122 lbs, 04/02/22 weight was 124.6 lbs Constitutional: NAD General:  thin, WD EYES: anicteric sclera, lids intact, no discharge  ENMT: hard of hearing CV: S1S2, RRR,  RLE edema 1+, none on left Pulmonary: CTAB, no increased work of breathing, no cough, room air Abdomen: hypo-active BS + 4 quadrants, soft and non tender, no ascites GU: deferred MSK: no sarcopenia, moves all extremities, wc/scooter bound Skin: warm and dry, no rashes or wounds on visible skin Neuro:  noted generalized weakness BLE and resting left hand tremor, no cognitive impairment Psych: non-anxious affect, A and O x 3 Hem/lymph/immuno: no widespread bruising   Thank you for the opportunity to participate in the care of Ms. Hartmann.  The palliative care team will continue to follow. Please call our office at [phone] if we can be of additional assistance.   Marijo Conception, FNP -C  COVID-19 PATIENT SCREENING TOOL Asked and negative response unless otherwise noted:   Have you had symptoms of covid, tested positive or been in contact with someone with symptoms/positive test in the past 5-10 days?  unknown

## 2022-10-04 DIAGNOSIS — Z85828 Personal history of other malignant neoplasm of skin: Secondary | ICD-10-CM | POA: Diagnosis not present

## 2022-10-04 DIAGNOSIS — L579 Skin changes due to chronic exposure to nonionizing radiation, unspecified: Secondary | ICD-10-CM | POA: Diagnosis not present

## 2022-10-04 DIAGNOSIS — I10 Essential (primary) hypertension: Secondary | ICD-10-CM | POA: Diagnosis not present

## 2022-10-04 DIAGNOSIS — N1831 Chronic kidney disease, stage 3a: Secondary | ICD-10-CM | POA: Diagnosis not present

## 2022-10-04 DIAGNOSIS — L814 Other melanin hyperpigmentation: Secondary | ICD-10-CM | POA: Diagnosis not present

## 2022-10-04 DIAGNOSIS — M159 Polyosteoarthritis, unspecified: Secondary | ICD-10-CM | POA: Diagnosis not present

## 2022-10-04 DIAGNOSIS — D692 Other nonthrombocytopenic purpura: Secondary | ICD-10-CM | POA: Diagnosis not present

## 2022-10-04 LAB — BASIC METABOLIC PANEL
BUN: 31 — AB (ref 4–21)
CO2: 30 — AB (ref 13–22)
Chloride: 101 (ref 99–108)
Creatinine: 1 (ref 0.5–1.1)
Glucose: 89
Potassium: 3.6 mEq/L (ref 3.5–5.1)
Sodium: 139 (ref 137–147)

## 2022-10-04 LAB — COMPREHENSIVE METABOLIC PANEL
Albumin: 3.4 — AB (ref 3.5–5.0)
Calcium: 9.1 (ref 8.7–10.7)
Globulin: 3.1
eGFR: 53

## 2022-10-04 LAB — CBC AND DIFFERENTIAL
HCT: 36 (ref 36–46)
Hemoglobin: 12.1 (ref 12.0–16.0)
Neutrophils Absolute: 4256
Platelets: 199 10*3/uL (ref 150–400)
WBC: 7.3

## 2022-10-04 LAB — HEPATIC FUNCTION PANEL
ALT: 9 U/L (ref 7–35)
AST: 15 (ref 13–35)
Alkaline Phosphatase: 55 (ref 25–125)
Bilirubin, Total: 0.5

## 2022-10-04 LAB — CBC: RBC: 3.46 — AB (ref 3.87–5.11)

## 2022-10-12 ENCOUNTER — Encounter: Payer: Self-pay | Admitting: Orthopedic Surgery

## 2022-10-12 ENCOUNTER — Non-Acute Institutional Stay (SKILLED_NURSING_FACILITY): Payer: PPO | Admitting: Orthopedic Surgery

## 2022-10-12 DIAGNOSIS — Z0000 Encounter for general adult medical examination without abnormal findings: Secondary | ICD-10-CM | POA: Diagnosis not present

## 2022-10-12 NOTE — Progress Notes (Signed)
Subjective:   Ann Jensen is a 89 y.o. female who presents for Medicare Annual (Subsequent) preventive examination.  Place of Service: [facility] Provider: Windell Moulding, AGNP-C   Review of Systems     Cardiac Risk Factors include: advanced age (>5mn, >>27women);hypertension;sedentary lifestyle     Objective:    Today's Vitals   10/12/22 0943 10/12/22 1425  BP: 116/72   Pulse: 64   Resp: 20   Temp: 97.6 F (36.4 C)   SpO2: 92%   Weight: 123 lb 6 oz (56 kg)   Height: 5' (1.524 m)   PainSc: 0-No pain 0-No pain   Body mass index is 24.1 kg/m.     10/12/2022    9:55 AM 09/26/2022    1:49 PM 07/18/2022   10:44 AM 04/11/2022    9:52 AM 03/09/2022    2:16 PM 02/06/2022    3:56 PM 01/19/2022    9:49 AM  Advanced Directives  Does Patient Have a Medical Advance Directive? Yes Yes Yes Yes Yes Yes Yes  Type of AParamedicof ABear ValleyLiving will;Out of facility DNR (pink MOST or yellow form) HB[place]Out of facility DNR (pink MOST or yellow form) HEkwokLiving will;Out of facility DNR (pink MOST or yellow form) Out of facility DNR (pink MOST or yellow form);Living will;Healthcare Power of Attorney Out of facility DNR (pink MOST or yellow form);Living will;Healthcare Power of Attorney Out of facility DNR (pink MOST or yellow form);Living will;Healthcare Power of Attorney Out of facility DNR (pink MOST or yellow form);Living will;Healthcare Power of Attorney  Does patient want to make changes to medical advance directive? No - Patient declined No - Patient declined No - Patient declined No - Patient declined No - Patient declined No - Patient declined No - Patient declined  Copy of [place] Chart? Yes - validated most recent copy scanned in chart (See row information) Yes - validated most recent copy scanned in chart (See row information)  Yes - validated most recent copy scanned  in chart (See row information) Yes - validated most recent copy scanned in chart (See row information) Yes - validated most recent copy scanned in chart (See row information) Yes - validated most recent copy scanned in chart (See row information)  Pre-existing out of facility DNR order (yellow form or pink MOST form) Pink MOST form placed in chart (order not valid for inpatient use);Yellow form placed in chart (order not valid for inpatient use)  Pink Most/Yellow Form available - Physician notified to receive inpatient order Yellow form placed in chart (order not valid for inpatient use);Pink MOST form placed in chart (order not valid for inpatient use) Yellow form placed in chart (order not valid for inpatient use);Pink MOST form placed in chart (order not valid for inpatient use) Yellow form placed in chart (order not valid for inpatient use);Pink MOST form placed in chart (order not valid for inpatient use) Yellow form placed in chart (order not valid for inpatient use);Pink MOST form placed in chart (order not valid for inpatient use)    Current Medications (verified) Outpatient Encounter Medications as of 10/12/2022  Medication Sig   acetaminophen (TYLENOL) 325 MG tablet Take 650 mg by mouth every 4 (four) hours as needed.   acetaminophen (TYLENOL) 325 MG tablet Take 650 mg by mouth at bedtime.   amitriptyline (ELAVIL) 10 MG tablet Take 10 mg by mouth at bedtime.   aspirin EC  81 MG tablet Take 81 mg by mouth daily. Mondays and Thursdays   Biotin 5000 MCG TABS Take 5,000 mcg by mouth daily.    bisacodyl (DULCOLAX) 10 MG suppository Place 10 mg rectally every other day. As needed   Cholecalciferol (VITAMIN D) 50 MCG (2000 UT) CAPS Take 2,000 Units by mouth daily.    Eyelid Cleansers (OCUSOFT EYELID CLEANSING) PADS Apply 1 Pad topically every morning.   magnesium hydroxide (MILK OF MAGNESIA) 400 MG/5ML suspension Take 30 mLs by mouth daily as needed for mild constipation.   metolazone (ZAROXOLYN)  2.5 MG tablet Take 2.5 mg by mouth 2 (two) times a week. On Wed. And Sat. only   metoprolol tartrate (LOPRESSOR) 50 MG tablet TAKE 1 TABLET BY MOUTH TWICE DAILY.   multivitamin-lutein (OCUVITE-LUTEIN) CAPS capsule Take 1 capsule by mouth 2 (two) times daily.    polyethylene glycol (MIRALAX / GLYCOLAX) 17 g packet Take 17 g by mouth daily.   polyvinyl alcohol (LIQUIFILM TEARS) 1.4 % ophthalmic solution Place 1 drop into both eyes 3 (three) times daily.   Potassium Chloride ER 20 MEQ TBCR Take 1 tablet by mouth 2 (two) times a week. On Wed. And Sat.   rOPINIRole (REQUIP) 1 MG tablet TAKE 1 TABLET EACH EVENING.   sennosides-docusate sodium (SENOKOT-S) 8.6-50 MG tablet Take 2 tablets by mouth daily. as needed for constipation at bedtime.   White Petrolatum-Mineral Oil (GENTEAL TEARS NIGHT-TIME) OINT Apply to eye as directed. Instill 0.25 inch in both eyes at bedtime for Dry eyes to conjunctival sac   zinc oxide 20 % ointment Apply 1 application topically as needed for irritation. Special Instructions: To buttocks after every incontinent episode and as needed for redness. May keep at bedside.   [DISCONTINUED] amitriptyline (ELAVIL) 10 MG tablet Take 10 mg by mouth at bedtime. For Depression (Patient not taking: Reported on 09/26/2022)   No facility-administered encounter medications on file as of 10/12/2022.    Allergies (verified) Patient has no known allergies.   History: Past Medical History:  Diagnosis Date   Advanced care planning/counseling discussion 11/18/2018   Anemia    Closed compression fracture of L5 lumbar vertebra, sequela 09/08/2019   Hypertension    OA (osteoarthritis)    Obesity (BMI 30-39.9) 01/20/2018   Osteoporosis    Renal cyst    UTI (urinary tract infection) 11/28/2020   11/28/20 urine culture, Citrobacter, Keflex '500mg'$  tid x 7 day.    Weight gain 11/25/2018   Past Surgical History:  Procedure Laterality Date   APPENDECTOMY     BLADDER SUSPENSION     CATARACT  EXTRACTION     CESAREAN SECTION     REPLACEMENT TOTAL KNEE  1995   TONSILLECTOMY     Family History  Problem Relation Age of Onset   Colon cancer Mother    Arthritis Mother    Heart disease Father    Heart attack Father    Migraines Sister    Allergies Sister    Asthma Sister    Social History   Socioeconomic History   Marital status: Widowed    Spouse name: Not on file   Number of children: 2   Years of education: 3   Highest education level: Not on file  Occupational History   Occupation: Retired Marine scientist  Tobacco Use   Smoking status: Former    Types: E-cigarettes   Smokeless tobacco: Never   Tobacco comments:    when she was 89 years old  Vaping Use   Vaping  Use: Never used  Substance and Sexual Activity   Alcohol use: No   Drug use: No   Sexual activity: Never    Birth control/protection: None  Other Topics Concern   Not on file  Social History Narrative   Coffee-with Caffeine   Widow   2 sons   No pets   Retired Therapist, sports   No exercise   HCPOA, Living Will, No DNR   + glasses   + hearing aids   Social Determinants of Health   Financial Resource Strain: Low Risk  (10/12/2022)   Overall Financial Resource Strain (CARDIA)    Difficulty of Paying Living Expenses: Not hard at all  Food Insecurity: No Food Insecurity (10/12/2022)   Hunger Vital Sign    Worried About Running Out of Food in the Last Year: Never true    [place] in the Last Year: Never true  Transportation Needs: Unknown (10/12/2022)   PRAPARE - Hydrologist (Medical): No    Lack of Transportation (Non-Medical): Not on file  Physical Activity: Inactive (10/12/2022)   Exercise Vital Sign    Days of Exercise per Week: 0 days    Minutes of Exercise per Session: 0 min  Stress: No Stress Concern Present (10/12/2022)   [place]    Feeling of Stress : Not at all  Social Connections: Socially Isolated  (09/29/2021)   Social Connection and Isolation Panel [NHANES]    Frequency of Communication with Friends and Family: More than three times a week    Frequency of Social Gatherings with Friends and Family: More than three times a week    Attends Religious Services: Never    Marine scientist or Organizations: No    Attends Archivist Meetings: Never    Marital Status: Widowed    Tobacco Counseling Counseling given: Not Answered Tobacco comments: when she was 89 years old   Clinical Intake:  Pre-visit preparation completed: No  Pain : No/denies pain Pain Score: 0-No pain     BMI - recorded: 24.1 Nutritional Risks: None Diabetes: No  How often do you need to have someone help you when you read instructions, pamphlets, or other written materials from your doctor or pharmacy?: 3 - Sometimes What is the last grade level you completed in school?: Nursing degree  Diabetic: No  Interpreter Needed?: No      Activities of Daily Living    10/12/2022    2:29 PM  In your present state of health, do you have any difficulty performing the following activities:  Hearing? 1  Vision? 1  Difficulty concentrating or making decisions? 0  Walking or climbing stairs? 1  Dressing or bathing? 1  Doing errands, shopping? 1  Preparing Food and eating ? Y  Using the Toilet? Y  In the past six months, have you accidently leaked urine? Y  Do you have problems with loss of bowel control? Y  Managing your Medications? Y  Managing your Finances? Y  Housekeeping or managing your Housekeeping? Y    Patient Care Team: Virgie Dad, MD as PCP - General (Internal Medicine) Wardell Honour, MD as Attending Physician (Family Medicine)  Indicate any recent Medical Services you may have received from other than [facility] providers in the past year (date may be approximate).     Assessment:   This is a routine wellness examination for Ann Jensen.  Hearing/Vision screen No results  found.  Dietary issues and exercise activities discussed: Current Exercise Habits: The patient does not participate in regular exercise at present, Exercise limited by: cardiac condition(s);neurologic condition(s);orthopedic condition(s)   Goals Addressed             This Visit's Progress    DIET - INCREASE WATER INTAKE   Not on track      Depression Screen    10/12/2022    2:28 PM 10/12/2022    9:50 AM 09/29/2021    1:43 PM 09/29/2021    1:40 PM 07/23/2019   11:24 AM 05/06/2018    3:22 PM 01/01/2018    1:03 PM  PHQ 2/9 Scores  PHQ - 2 Score 0 0 0 0 0 0 0    Fall Risk    10/12/2022    2:29 PM 10/12/2022    9:50 AM 09/29/2021    1:43 PM 08/12/2019    2:51 PM 07/23/2019   11:24 AM  [place] in the past year? 0 0 1 0 0  Number falls in past yr: 0 0 0 0 0  Injury with Fall? 0 0 0  0  Risk for fall due to : History of fall(s);Impaired balance/gait;Impaired mobility No Fall Risks History of fall(s);Impaired balance/gait;Orthopedic patient    Follow up Falls evaluation completed;Education provided;Falls prevention discussed Falls evaluation completed Falls evaluation completed;Education provided;Falls prevention discussed      FALL RISK PREVENTION PERTAINING TO THE HOME:  Any stairs in or around the home? No  If so, are there any without handrails? No  Home free of loose throw rugs in walkways, pet beds, electrical cords, etc? Yes  Adequate lighting in your home to reduce risk of falls? Yes   ASSISTIVE DEVICES UTILIZED TO PREVENT FALLS:  Life alert? No  Use of a cane, walker or w/c? Yes  Grab bars in the bathroom? Yes  Shower chair or bench in shower? Yes  Elevated toilet seat or a handicapped toilet? Yes   TIMED UP AND GO:  Was the test performed? No .  Length of time to ambulate 10 feet: N/A sec.   Gait slow and steady with assistive device  Cognitive Function:    09/29/2021    1:44 PM 01/01/2018    1:06 PM  MMSE - Mini Mental State Exam  Not  completed: Refused   Orientation to time  5  Orientation to Place  5  Registration  3  Attention/ Calculation  4  Recall  1  Language- name 2 objects  2  Language- repeat  1  Language- follow 3 step command  3  Language- read & follow direction  1  Write a sentence  1  Copy design  0  Total score  26        10/12/2022    2:29 PM 09/29/2021    1:45 PM 07/23/2019   11:30 AM  6CIT Screen  What Year? 0 points 0 points 0 points  What month? 0 points 0 points 0 points  What time? 0 points 0 points 0 points  Count back from 20 0 points 0 points 0 points  Months in reverse 0 points 0 points 0 points  Repeat phrase 4 points 4 points 4 points  Total Score 4 points 4 points 4 points    Immunizations Immunization History  Administered Date(s) Administered   Covid-19,MRNA Vaccine(Spikevax)27mo. thru 123yr11/03/2022   Fluad Quad(high Dose 65+) 07/25/2022   Influenza, High Dose Seasonal PF 07/10/2017, 07/15/2019, 07/05/2020,  07/25/2021   Influenza,inj,Quad PF,6+ Mos 07/03/2018   Influenza-Unspecified 07/04/2015, 07/10/2017   Moderna SARS-COV2 Booster Vaccination 03/08/2021, 03/14/2022   Moderna Sars-Covid-2 Vaccination 10/05/2019, 11/02/2019, 08/15/2020, 06/21/2021   PFIZER(Purple Top)SARS-COV-2 Vaccination 06/21/2021   Pneumococcal Conjugate-13 08/18/2014   Pneumococcal Polysaccharide-23 07/26/2010   Tdap 11/12/2018    TDAP status: Up to date  Flu Vaccine status: Up to date  Pneumococcal vaccine status: Up to date  Covid-19 vaccine status: Completed vaccines  Qualifies for Shingles Vaccine? Yes   Zostavax completed No   Shingrix Completed?: No.    Education has been provided regarding the importance of this vaccine. Patient has been advised to call insurance company to determine out of pocket expense if they have not yet received this vaccine. Advised may also receive vaccine at local pharmacy or Health Dept. Verbalized acceptance and understanding.  Screening  Tests Health Maintenance  Topic Date Due   Zoster Vaccines- Shingrix (1 of 2) Never done   COVID-19 Vaccine (5 - 2023-24 season) 10/02/2022   Medicare Annual Wellness (AWV)  10/13/2023   DTaP/Tdap/Td (2 - Td or Tdap) 11/12/2028   Pneumonia Vaccine 20+ Years old  Completed   INFLUENZA VACCINE  Completed   HPV VACCINES  Aged Out   DEXA SCAN  Discontinued    Health Maintenance  Health Maintenance Due  Topic Date Due   Zoster Vaccines- Shingrix (1 of 2) Never done   COVID-19 Vaccine (5 - 2023-24 season) 10/02/2022    Colorectal cancer screening: No longer required.   Mammogram status: No longer required due to advanced age.  Bone Density status: Completed N/A- nonambulatory. Results reflect: Bone density results: OSTEOPOROSIS. Repeat every N/A years.  Lung Cancer Screening: (Low Dose CT Chest recommended if Age 60-80 years, 30 pack-year currently smoking OR have quit w/in 15years.) does not qualify.   Lung Cancer Screening Referral: No  Additional Screening:  Hepatitis C Screening: does not qualify; Completed   Vision Screening: Recommended annual ophthalmology exams for early detection of glaucoma and other disorders of the eye. Is the patient up to date with their annual eye exam?  No  Who is the provider or what is the name of the office in which the patient attends annual eye exams? In house provider If pt is not established with a provider, would they like to be referred to a provider to establish care? No .   Dental Screening: Recommended annual dental exams for proper oral hygiene  Community Resource Referral / Chronic Care Management: CRR required this visit?  No   CCM required this visit?  No      Plan:     I have personally reviewed and noted the following in the patient's chart:   Medical and social history Use of alcohol, tobacco or illicit drugs  Current medications and supplements including opioid prescriptions. Patient is not currently taking opioid  prescriptions. Functional ability and status Nutritional status Physical activity Advanced directives List of other physicians Hospitalizations, surgeries, and ER visits in previous 12 months Vitals Screenings to include cognitive, depression, and falls Referrals and appointments  In addition, I have reviewed and discussed with patient certain preventive protocols, quality metrics, and best practice recommendations. A written personalized care plan for preventive services as well as general preventive health recommendations were provided to patient.     Ann Alanis, NP   10/12/2022   Nurse Notes: will discuss shingles vaccine with son

## 2022-10-19 DIAGNOSIS — M79672 Pain in left foot: Secondary | ICD-10-CM | POA: Diagnosis not present

## 2022-10-19 DIAGNOSIS — L84 Corns and callosities: Secondary | ICD-10-CM | POA: Diagnosis not present

## 2022-10-19 DIAGNOSIS — M79671 Pain in right foot: Secondary | ICD-10-CM | POA: Diagnosis not present

## 2022-10-19 DIAGNOSIS — B351 Tinea unguium: Secondary | ICD-10-CM | POA: Diagnosis not present

## 2022-10-26 ENCOUNTER — Encounter: Payer: Self-pay | Admitting: Orthopedic Surgery

## 2022-10-26 ENCOUNTER — Non-Acute Institutional Stay (SKILLED_NURSING_FACILITY): Payer: PPO | Admitting: Orthopedic Surgery

## 2022-10-26 DIAGNOSIS — R309 Painful micturition, unspecified: Secondary | ICD-10-CM

## 2022-10-26 DIAGNOSIS — R269 Unspecified abnormalities of gait and mobility: Secondary | ICD-10-CM

## 2022-10-26 DIAGNOSIS — M159 Polyosteoarthritis, unspecified: Secondary | ICD-10-CM

## 2022-10-26 DIAGNOSIS — G2581 Restless legs syndrome: Secondary | ICD-10-CM

## 2022-10-26 DIAGNOSIS — I10 Essential (primary) hypertension: Secondary | ICD-10-CM

## 2022-10-26 DIAGNOSIS — N1831 Chronic kidney disease, stage 3a: Secondary | ICD-10-CM

## 2022-10-26 DIAGNOSIS — H02135 Senile ectropion of left lower eyelid: Secondary | ICD-10-CM

## 2022-10-26 DIAGNOSIS — R600 Localized edema: Secondary | ICD-10-CM

## 2022-10-26 DIAGNOSIS — D4989 Neoplasm of unspecified behavior of other specified sites: Secondary | ICD-10-CM | POA: Diagnosis not present

## 2022-10-26 NOTE — Progress Notes (Signed)
Location:  [facility] Number: 01 Place of Service:  SNF [phone]) Provider:  Windell Moulding, NP   Patient Care Team: Virgie Dad, MD as PCP - General (Internal Medicine) Wardell Honour, MD as Attending Physician [facility])  Extended Emergency Contact Information Primary Emergency Contact: [facility] Address: [place] [address] [place] Phone: [phone] Mobile Phone: [phone] Relation: Son Secondary Emergency Contact: [facility] Address: [address]          [place], [place] [address] [place] Phone: [phone] Relation: Son  Code Status:  DNR  Goals of care: Advanced Directive information    10/26/2022    9:07 AM  Advanced Directives  Does Patient Have a Medical Advance Directive? Yes  Type of Paramedic of [place];Living will;Out of facility DNR (pink MOST or yellow form)  Does patient want to make changes to medical advance directive? No - Patient declined  Copy of [place] in Chart? Yes - validated most recent copy scanned in chart (See row information)  Pre-existing out of facility DNR order (yellow form or pink MOST form) Pink MOST form placed in chart (order not valid for inpatient use);Yellow form placed in chart (order not valid for inpatient use)     Chief Complaint  Patient presents with   Medical Management of Chronic Issues    Routine Visit    HPI:  Pt is a 89 y.o. female seen today for medical management of chronic conditions.   She currently resides on the skilled nursing unit at [facility]. PMH: hypertension, PVD, osteoarthritis of multiple joints, CKD stage 3, posterior tendon dysfunction with right foot drop, constipation, restless leg syndrome and anxiety.    HTN-  BUN/creat 31/1.0 10/04/2022, remains on metoprolol CKD- GFR 53 (01/04)> 49 (03/2022) Painful micturition/ Dysuria- ongoing,  unsuccessful trial of Uribel 06/2021 and Estrace (breast tenderness), past urine cultures unremarkable, refused urology consult, remains on amitriptyline, symptoms improved with good hydration BLE- R>L, given metolazone/potassium twice weekly, unsuccessful trial of leg wrappings, does not always elevate legs in recliner, BUN/creat 31/1.0, K+ 3.6 10/04/2022 OA- involves right leg/shoulder/back pain, remains on tylenol  RLS- remains on requip qhs Gait abnormality- uses power wheelchair, no recent falls or injuries Left lower lid ectropion- stable with artificial tears and Genteal ointment Neoplasm right lower leg- evaluated by in house dermatologist, refused biopsy 09/06/2022, denies pain/change in size today  Recent blood pressures:  01/23- 118/72  01/16- 112/73  01/09- 116/72  Recent weights:  01/09- 123.6 lbs  12/07- 122 lbs  11/01- 121.4 lbs     Past Medical History:  Diagnosis Date   Advanced care planning/counseling discussion 11/18/2018   Anemia    Closed compression fracture of L5 lumbar vertebra, sequela 09/08/2019   Hypertension    OA (osteoarthritis)    Obesity (BMI 30-39.9) 01/20/2018   Osteoporosis    Renal cyst    UTI (urinary tract infection) 11/28/2020   11/28/20 urine culture, Citrobacter, Keflex '500mg'$  tid x 7 day.    Weight gain 11/25/2018   Past Surgical History:  Procedure Laterality Date   APPENDECTOMY     BLADDER SUSPENSION     CATARACT EXTRACTION     CESAREAN SECTION     REPLACEMENT TOTAL KNEE  1995   TONSILLECTOMY      No Known Allergies  Outpatient Encounter Medications as of 10/26/2022  Medication  Sig   acetaminophen (TYLENOL) 325 MG tablet Take 650 mg by mouth every 4 (four) hours as needed.   acetaminophen (TYLENOL) 325 MG tablet Take 650 mg by mouth at bedtime.   amitriptyline (ELAVIL) 10 MG tablet Take 10 mg by mouth at bedtime.   aspirin EC 81 MG tablet Take 81 mg by mouth daily. Mondays and Thursdays   Biotin 5000 MCG TABS Take 5,000 mcg  by mouth daily.    bisacodyl (DULCOLAX) 10 MG suppository Place 10 mg rectally every other day. As needed   Cholecalciferol (VITAMIN D) 50 MCG (2000 UT) CAPS Take 2,000 Units by mouth daily.    Eyelid Cleansers (OCUSOFT EYELID CLEANSING) PADS Apply 1 Pad topically every morning.   magnesium hydroxide (MILK OF MAGNESIA) 400 MG/5ML suspension Take 30 mLs by mouth daily as needed for mild constipation.   metolazone (ZAROXOLYN) 2.5 MG tablet Take 2.5 mg by mouth 2 (two) times a week. On Wed. And Sat. only   metoprolol tartrate (LOPRESSOR) 50 MG tablet TAKE 1 TABLET BY MOUTH TWICE DAILY.   multivitamin-lutein (OCUVITE-LUTEIN) CAPS capsule Take 1 capsule by mouth 2 (two) times daily.    polyethylene glycol (MIRALAX / GLYCOLAX) 17 g packet Take 17 g by mouth daily.   polyvinyl alcohol (LIQUIFILM TEARS) 1.4 % ophthalmic solution Place 1 drop into both eyes 3 (three) times daily.   Potassium Chloride ER 20 MEQ TBCR Take 1 tablet by mouth 2 (two) times a week. On Wed. And Sat.   rOPINIRole (REQUIP) 1 MG tablet TAKE 1 TABLET EACH EVENING.   sennosides-docusate sodium (SENOKOT-S) 8.6-50 MG tablet Take 2 tablets by mouth daily. as needed for constipation at bedtime.   White Petrolatum-Mineral Oil (GENTEAL TEARS NIGHT-TIME) OINT Apply to eye as directed. Instill 0.25 inch in both eyes at bedtime for Dry eyes to conjunctival sac   zinc oxide 20 % ointment Apply 1 application topically as needed for irritation. Special Instructions: To buttocks after every incontinent episode and as needed for redness. May keep at bedside.   No facility-administered encounter medications on file as of 10/26/2022.    Review of Systems  Constitutional:  Negative for activity change, appetite change, fatigue and fever.  HENT:  Positive for hearing loss. Negative for congestion and trouble swallowing.   Eyes:  Negative for discharge and itching.  Respiratory:  Negative for cough, shortness of breath and wheezing.    Cardiovascular:  Positive for leg swelling. Negative for chest pain.  Gastrointestinal:  Positive for constipation. Negative for abdominal distention, abdominal pain, blood in stool, diarrhea, nausea and vomiting.  Genitourinary:  Positive for dysuria. Negative for frequency and hematuria.       Incontinence  Musculoskeletal:  Positive for arthralgias and gait problem.  Skin:  Negative for wound.  Neurological:  Positive for tremors and weakness. Negative for dizziness and headaches.  Psychiatric/Behavioral:  Positive for dysphoric mood. Negative for confusion and sleep disturbance. The patient is nervous/anxious.     Immunization History  Administered Date(s) Administered   Covid-19,MRNA Vaccine(Spikevax)76mo. thru 147yr11/03/2022   Fluad Quad(high Dose 65+) 07/25/2022   Influenza, High Dose Seasonal PF 07/10/2017, 07/15/2019, 07/05/2020, 07/25/2021   Influenza,inj,Quad PF,6+ Mos 07/03/2018   Influenza-Unspecified 07/04/2015, 07/10/2017   Moderna SARS-COV2 Booster Vaccination 03/08/2021, 03/14/2022   Moderna Sars-Covid-2 Vaccination 10/05/2019, 11/02/2019, 08/15/2020, 06/21/2021   PFIZER(Purple Top)SARS-COV-2 Vaccination 06/21/2021   Pneumococcal Conjugate-13 08/18/2014   Pneumococcal Polysaccharide-23 07/26/2010   Tdap 11/12/2018   Pertinent  Health Maintenance Due  Topic Date Due  INFLUENZA VACCINE  Completed   DEXA SCAN  Discontinued      08/12/2019    2:51 PM 09/29/2021    1:43 PM 10/12/2022    9:50 AM 10/12/2022    2:29 PM 10/26/2022    9:07 AM  [place] in the past year? 0 1 0 0 0  Was there an injury with Fall?  0 0 0 0  Fall Risk Category Calculator  1 0 0 0  Fall Risk Category (Retired)  Low Low Low   (RETIRED) Patient Fall Risk Level   Low fall risk High fall risk   Patient at Risk for Falls Due to  History of fall(s);Impaired balance/gait;Orthopedic patient No Fall Risks History of fall(s);Impaired balance/gait;Impaired mobility History of  fall(s);Impaired balance/gait;Impaired mobility  Fall risk Follow up  Falls evaluation completed;Education provided;Falls prevention discussed Falls evaluation completed Falls evaluation completed;Education provided;Falls prevention discussed Falls evaluation completed   Functional Status Survey:    Vitals:   10/26/22 0903  BP: 118/72  Pulse: 82  Resp: 18  Temp: (!) 97.5 F (36.4 C)  SpO2: 94%  Weight: 123 lb 6 oz (56 kg)  Height: 5' (1.524 m)   Body mass index is 24.1 kg/m. Physical Exam Vitals reviewed.  Constitutional:      General: She is not in acute distress. HENT:     Head: Normocephalic.     Right Ear: There is no impacted cerumen.     Left Ear: There is no impacted cerumen.     Nose: Nose normal.     Mouth/Throat:     Mouth: Mucous membranes are moist.  Eyes:     General:        Right eye: No discharge.        Left eye: No discharge.     Pupils: Pupils are equal, round, and reactive to light.     Comments: Left lower lid ectropion  Cardiovascular:     Rate and Rhythm: Normal rate and regular rhythm.     Pulses: Normal pulses.     Heart sounds: Murmur heard.  Pulmonary:     Effort: Pulmonary effort is normal. No respiratory distress.     Breath sounds: Normal breath sounds. No wheezing.  Abdominal:     General: Bowel sounds are normal. There is no distension.     Palpations: Abdomen is soft.     Tenderness: There is no abdominal tenderness.  Musculoskeletal:     Right hand: Deformity present. Decreased range of motion.     Left hand: Deformity present. Decreased range of motion.     Cervical back: Neck supple.     Right lower leg: Edema present.     Left lower leg: Edema present.     Comments: Non pitting, right foot drop  Skin:    General: Skin is warm and dry.     Capillary Refill: Capillary refill takes less than 2 seconds.     Comments: Approx 4 cm round nodule to left anterior shin> below knee, nontender, no change in size of skin breakdown   Neurological:     General: No focal deficit present.     Mental Status: She is alert and oriented to person, place, and time.     Motor: Weakness present.     Gait: Gait abnormal.     Comments: Left hand resting tremor  Psychiatric:        Mood and Affect: Mood normal.        Behavior:  Behavior normal.     Labs reviewed: Recent Labs    03/05/22 0000 03/15/22 0000 10/04/22 0940  NA 137 136* 139  K 3.5 3.7 3.6  CL 98* 97* 101  CO2 32* 34* 30*  BUN 32* 32* 31*  CREATININE 0.9 1.0 1.0  CALCIUM 9.2 9.6 9.1   Recent Labs    11/02/21 0000 10/04/22 0940  AST 11* 15  ALT 8 9  ALKPHOS 65 55  ALBUMIN 3.6 3.4*   Recent Labs    11/02/21 0000 11/20/21 0000 10/04/22 0940  WBC 6.8 9.2 7.3  NEUTROABS 3,012.00 5,373.00 4,256.00  HGB 11.9* 12.1 12.1  HCT 36 36 36  PLT 215 239 199   Lab Results  Component Value Date   TSH 2.01 11/02/2021   No results found for: "HGBA1C" Lab Results  Component Value Date   CHOL 175 07/23/2019   HDL 54 07/23/2019   LDLCALC 105 (H) 07/23/2019   TRIG 74 07/23/2019   CHOLHDL 3.2 07/23/2019    Significant Diagnostic Results in last 30 days:  No results found.  Assessment/Plan 1. Essential hypertension - controlled, goal < 150/90 - cont metoprolol  2. Stage 3a chronic kidney disease (HCC) - GFR 53> was 49 - on diuretics - avoid nephrotoxic drugs like NSAIDS and dose adjust medications to be renally excreted - encourage hydration with water   3. Painful micturition - ongoing - past workup unremarkable - symptoms improved with good hydration with water - cont amitriptyline   4. Bilateral leg edema - cont metolazone 2x/week  5. Primary osteoarthritis involving multiple joints - stable with scheduled tylenol  6. RLS (restless legs syndrome) - stable with Requip  7. Gait abnormality - nonambulatory - uses PWC - cont skilled nursing  8. Senile ectropion of left lower eyelid - cont liquid tears and Genteal ointment  9.  Neoplasm of lower leg - 08/2022 evaluated by dermatology - refused biopsy - denies pain     Family/ staff Communication: plan discussed with patient and nurse  Labs/tests ordered:  none

## 2022-11-01 DIAGNOSIS — D692 Other nonthrombocytopenic purpura: Secondary | ICD-10-CM | POA: Diagnosis not present

## 2022-11-01 DIAGNOSIS — Z85828 Personal history of other malignant neoplasm of skin: Secondary | ICD-10-CM | POA: Diagnosis not present

## 2022-11-01 DIAGNOSIS — L570 Actinic keratosis: Secondary | ICD-10-CM | POA: Diagnosis not present

## 2022-11-01 DIAGNOSIS — L579 Skin changes due to chronic exposure to nonionizing radiation, unspecified: Secondary | ICD-10-CM | POA: Diagnosis not present

## 2022-11-14 ENCOUNTER — Non-Acute Institutional Stay: Payer: PPO | Admitting: Family Medicine

## 2022-11-14 ENCOUNTER — Encounter: Payer: Self-pay | Admitting: Family Medicine

## 2022-11-14 VITALS — BP 118/82 | HR 75 | Resp 18

## 2022-11-14 DIAGNOSIS — M159 Polyosteoarthritis, unspecified: Secondary | ICD-10-CM | POA: Diagnosis not present

## 2022-11-14 DIAGNOSIS — N951 Menopausal and female climacteric states: Secondary | ICD-10-CM

## 2022-11-14 DIAGNOSIS — R300 Dysuria: Secondary | ICD-10-CM

## 2022-11-14 NOTE — Progress Notes (Signed)
[facility] Pall[facility] Consult Note Telephone: [phone]  Fax: [phone]    Date of encounter: 11/14/22 2:34 PM PATIENT NAME: Ann Jensen [place] [address]   [phone] (home)  DOB: 26-Mar-1922 MRN: [id number] PRIMARY CARE PROVIDER:    Virgie Dad, MD,  [place] [address] [phone]  REFERRING PROVIDER:   Virgie Dad, MD [address] [place],  [place] [address] [phone]  RESPONSIBLE PARTY:    Contact Information     Name Relation Home Work [place] Son [place]   [place], [place] [phone]          I met face to face with patient in [facility] facility. Palliative Care was asked to follow this patient by consultation request of  Virgie Dad, MD to address advance care planning and complex medical decision making. This is a follow up visit   ASSESSMENT , SYMPTOM MANAGEMENT AND PLAN / RECOMMENDATIONS:    Primary OA of right knee with posterior tibial tendon dysfunction s/p right TKR in 1995/Gait instability Likely right TKR is at end of expected function given almost 28 years since replacement.  Complicated by posterior tibial dysfunction resulting in knee instability and pain with external rotation at the ankle of right foot. Pt not a good candidate for surgical revision given her advanced age. Recommend continuation of Acetaminophen for pain. Chairbound, unable to ambulate or stand/pivot  2.  Menopausal vaginal dryness Likely multifactorial related to atrophy, dryness and increased pH. 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 No significant improvement per pt. Continue Elavil 10 mg QHS may. Encourage increased fluid intake   Advance Care Planning/Goals of Care: Goals include to maximize quality of life and symptom management.  Identification of a healthcare  agent -son, Gal Chmiel, MD Review of an advance directive document-MOST and DNR. Decision not to resuscitate or to de-escalate disease focused treatments due to poor prognosis. CODE STATUS: MOST as of 11/18/2018: DNR/DNI with comfort measures Use of antibiotics and IV fluids on a case by case, time limited basis No feeding tube.     Follow up Palliative Care Visit: Palliative care will continue to follow for complex medical decision making, advance care planning, and clarification of goals. Return 6-8 weeks or prn.    This visit was coded based on medical decision making (MDM).  PPS: 50%  HOSPICE ELIGIBILITY/DIAGNOSIS: TBD  Chief Complaint:  Palliative Care is continuing to follow patient for chronic medical management in setting of advanced age, debility and CKD.  HISTORY OF PRESENT ILLNESS:  Ann Jensen is a 89 y.o. year old female with HTN, PVD, constipation, posterior tibial tendon dysfunction on the right, osteoporosis, OA of multiple joints, CKD stage 3, insomnia secondary to depression/anxiety, RLS, anemia, HLD, generalized weakness/gait abnormality, dysuria, right thigh pain and elevated IOP.   She currently uses a scooter for mobility and has to have a lift for transfers with deformity to right foot and ankle. Has pain in hip/knee, denies pain in foot.  Denies CP, SOB, nausea and vomiting.  Continues to have pressure and burning pain while urinating which is resolved after urination complete.  Pt states noticeable improvement when she does increase her fluid intake but finds it hard to remember to drink the water frequently.  She states poor appetite but reports is due to not liking the food except for breakfast.   History obtained from  review of EMR, discussion with facility staff and/or Ms. Schey.      Latest Ref Rng & Units 10/04/2022    9:40 AM 11/20/2021   12:00 AM 11/02/2021   12:00 AM  CBC  WBC  7.3     9.2     6.8      Hemoglobin 12.0 - 16.0 12.1     12.1      11.9      Hematocrit 36 - 46 36     36     36      Platelets 150 - 400 K/uL 199     239     215         This result is from an external source.      Latest Ref Rng & Units 10/04/2022    9:40 AM 03/15/2022   12:00 AM 03/05/2022   12:00 AM  CMP  BUN 4 - 21 31     32     32      Creatinine 0.5 - 1.1 1.0     1.0     0.9      Sodium 137 - 147 139     136     137      Potassium 3.5 - 5.1 mEq/L 3.6     3.7     3.5      Chloride 99 - 108 101     97     98      CO2 13 - 22 30     34     32      Calcium 8.7 - 10.7 9.1     9.6     9.2      Alkaline Phos 25 - 125 55        AST 13 - 35 15        ALT 7 - 35 U/L 9           This result is from an external source.      I reviewed EMR for available labs, medications, imaging, studies and related documents.  Records reviewed and summarized above.   ROS General: NAD Cardiovascular: denies chest pain, denies DOE Pulmonary: denies cough, denies SOB Abdomen: endorses fair appetite except is good with breakfast (does not like food), has constipation and staff reports incontinence of bowel GU: endorses dysuria and vaginal pain, staff reports incontinence of bladder MSK:  BLE weakness,  no falls reported, wc bound Skin: denies rashes or wounds Neurological: endorses chronic right knee pain and dysuria, denies insomnia Psych: Endorses positive mood Heme/lymph/immuno: denies bruises, abnormal bleeding  Physical Exam: Current and past weights: As of 10/23/22, weight was 123 lbs 6 oz, 09/06/22 weight 122 lbs Constitutional: NAD General:  thin, WD EYES: anicteric sclera, lids intact, no discharge  ENMT: hard of hearing, wears hearing aids CV: S1S2, RRR,  RLE edema 1-2+, none on left Pulmonary: CTAB, no increased work of breathing, no cough, room air Abdomen: normo-active BS + 4 quadrants, soft and non tender GU: deferred MSK: no sarcopenia, moves all extremities with right foot externally rotated at ankle with Jensen bottom foot, wc/scooter bound Skin:  warm and dry, no rashes or wounds on visible skin Neuro:  noted generalized weakness BLE and resting left hand tremor, no cognitive impairment Psych: non-anxious affect, A and O x 3 Hem/lymph/immuno: no widespread bruising   Thank you for the opportunity to participate in the care of Ms. Perales.  The  palliative care team will continue to follow. Please call our office at [phone] if we can be of additional assistance.   Marijo Conception, FNP -C  COVID-19 PATIENT SCREENING TOOL Asked and negative response unless otherwise noted:   Have you had symptoms of covid, tested positive or been in contact with someone with symptoms/positive test in the past 5-10 days?  unknown

## 2022-11-19 DIAGNOSIS — H02105 Unspecified ectropion of left lower eyelid: Secondary | ICD-10-CM | POA: Diagnosis not present

## 2022-11-22 ENCOUNTER — Non-Acute Institutional Stay (SKILLED_NURSING_FACILITY): Payer: PPO | Admitting: Internal Medicine

## 2022-11-22 ENCOUNTER — Encounter: Payer: Self-pay | Admitting: Internal Medicine

## 2022-11-22 DIAGNOSIS — I10 Essential (primary) hypertension: Secondary | ICD-10-CM | POA: Diagnosis not present

## 2022-11-22 DIAGNOSIS — G2581 Restless legs syndrome: Secondary | ICD-10-CM

## 2022-11-22 DIAGNOSIS — N1831 Chronic kidney disease, stage 3a: Secondary | ICD-10-CM | POA: Diagnosis not present

## 2022-11-22 DIAGNOSIS — R309 Painful micturition, unspecified: Secondary | ICD-10-CM

## 2022-11-22 DIAGNOSIS — M159 Polyosteoarthritis, unspecified: Secondary | ICD-10-CM

## 2022-11-22 DIAGNOSIS — R269 Unspecified abnormalities of gait and mobility: Secondary | ICD-10-CM | POA: Diagnosis not present

## 2022-11-22 DIAGNOSIS — R600 Localized edema: Secondary | ICD-10-CM

## 2022-11-22 NOTE — Progress Notes (Signed)
Location:  [facility] Room Number: 01-A Place of Service:  SNF [phone]) Provider:  Virgie Dad, MD   Virgie Dad, MD  Patient Care Team: Virgie Dad, MD as PCP - General (Internal Medicine) Wardell Honour, MD as Attending Physician [facility] Medicine)  Extended Emergency Contact Information Primary Emergency Contact: [facility] Address: [place] [address] [place] Phone: [phone] Mobile Phone: [phone] Relation: Son Secondary Emergency Contact: [facility] Address: [address]          [place], [place] [address] [place] Phone: [phone] Relation: Son  Code Status:  DNR Goals of care: Advanced Directive information    11/22/2022   10:29 AM  Advanced Directives  Does Patient Have a Medical Advance Directive? Yes  Type of Paramedic of [place];Living will;Out of facility DNR (pink MOST or yellow form)  Does patient want to make changes to medical advance directive? No - Patient declined  Copy of [place] in Chart? Yes - validated most recent copy scanned in chart (See row information)  Pre-existing out of facility DNR order (yellow form or pink MOST form) Pink MOST form placed in chart (order not valid for inpatient use);Yellow form placed in chart (order not valid for inpatient use)     Chief Complaint  Patient presents with   Medical Management of Chronic Issues    Patient is being seen for a routine visit.    HPI:  Pt is a 89 y.o. female seen today for medical management of chronic diseases.    Lives in SNF    Patient has h/o Hypertension, Anxiety, Restless leg Syndrome, Insomnia, Arthritis and Posterior Tendon Dysfunction with right foot Valgus in Right Knee with inability to Ambulate    Her acute issue continues to be Painful Micturition Multiple urine cultures have been negative Has skin Cancer but now  refuses further work up   No new Nursing issues. No Behavior issues Her weight is stable Uses her Power chair for Ambulation No Falls  Cognitively continues to do well Wt Readings from Last 3 Encounters:  11/22/22 123 lb (55.8 kg)  10/26/22 123 lb 6 oz (56 kg)  10/12/22 123 lb 6 oz (56 kg)   Past Medical History:  Diagnosis Date   Advanced care planning/counseling discussion 11/18/2018   Anemia    Closed compression fracture of L5 lumbar vertebra, sequela 09/08/2019   Hypertension    OA (osteoarthritis)    Obesity (BMI 30-39.9) 01/20/2018   Osteoporosis    Renal cyst    UTI (urinary tract infection) 11/28/2020   11/28/20 urine culture, Citrobacter, Keflex '500mg'$  tid x 7 day.    Weight gain 11/25/2018   Past Surgical History:  Procedure Laterality Date   APPENDECTOMY     BLADDER SUSPENSION     CATARACT EXTRACTION     CESAREAN SECTION     REPLACEMENT TOTAL KNEE  1995   TONSILLECTOMY      No Known Allergies  Outpatient Encounter Medications as of 11/22/2022  Medication Sig   acetaminophen (TYLENOL) 325 MG tablet Take 650 mg by mouth every 4 (four) hours as needed.   acetaminophen (TYLENOL) 325 MG tablet Take 650 mg by mouth at bedtime.   amitriptyline (ELAVIL) 10 MG tablet Take 10 mg by mouth at bedtime.   aspirin EC 81 MG tablet Take 81 mg by mouth daily. Mondays and  Thursdays   Biotin 5000 MCG TABS Take 5,000 mcg by mouth daily.    bisacodyl (DULCOLAX) 10 MG suppository Place 10 mg rectally every other day. As needed   Cholecalciferol (VITAMIN D) 50 MCG (2000 UT) CAPS Take 2,000 Units by mouth daily.    Eyelid Cleansers (OCUSOFT EYELID CLEANSING) PADS Apply 1 Pad topically every morning.   magnesium hydroxide (MILK OF MAGNESIA) 400 MG/5ML suspension Take 30 mLs by mouth daily as needed for mild constipation.   metolazone (ZAROXOLYN) 2.5 MG tablet Take 2.5 mg by mouth 2 (two) times a week. On Wed. And Sat. only   metoprolol tartrate (LOPRESSOR) 50 MG tablet TAKE 1 TABLET  BY MOUTH TWICE DAILY.   multivitamin-lutein (OCUVITE-LUTEIN) CAPS capsule Take 1 capsule by mouth 2 (two) times daily.    phenazopyridine (PYRIDIUM) 100 MG tablet Take 100 mg by mouth daily as needed for pain. Give one by mouth every 24 hours as needed for painful dysuria   polyethylene glycol (MIRALAX / GLYCOLAX) 17 g packet Take 17 g by mouth daily.   polyvinyl alcohol (LIQUIFILM TEARS) 1.4 % ophthalmic solution Place 1 drop into both eyes 3 (three) times daily.   Potassium Chloride ER 20 MEQ TBCR Take 1 tablet by mouth 2 (two) times a week. On Wed. And Sat.   rOPINIRole (REQUIP) 1 MG tablet TAKE 1 TABLET EACH EVENING.   sennosides-docusate sodium (SENOKOT-S) 8.6-50 MG tablet Take 2 tablets by mouth daily. as needed for constipation at bedtime.   White Petrolatum-Mineral Oil (GENTEAL TEARS NIGHT-TIME) OINT Apply to eye as directed. Instill 0.25 inch in both eyes at bedtime for Dry eyes to conjunctival sac   zinc oxide 20 % ointment Apply 1 application topically as needed for irritation. Special Instructions: To buttocks after every incontinent episode and as needed for redness. May keep at bedside.   No facility-administered encounter medications on file as of 11/22/2022.    Review of Systems  Constitutional:  Negative for activity change and appetite change.  HENT: Negative.    Respiratory:  Negative for cough and shortness of breath.   Cardiovascular:  Negative for leg swelling.  Gastrointestinal:  Negative for constipation.  Genitourinary: Negative.   Musculoskeletal:  Positive for gait problem. Negative for arthralgias and myalgias.  Skin: Negative.   Neurological:  Negative for dizziness and weakness.  Psychiatric/Behavioral:  Negative for confusion, dysphoric mood and sleep disturbance.     Immunization History  Administered Date(s) Administered   Covid-19,MRNA Vaccine(Spikevax)75mo. thru 169yr11/03/2022   Fluad Quad(high Dose 65+) 07/25/2022   Influenza, High Dose Seasonal PF  07/10/2017, 07/15/2019, 07/05/2020, 07/25/2021   Influenza,inj,Quad PF,6+ Mos 07/03/2018   Influenza-Unspecified 07/04/2015, 07/10/2017   Moderna SARS-COV2 Booster Vaccination 03/08/2021, 03/14/2022   Moderna Sars-Covid-2 Vaccination 10/05/2019, 11/02/2019, 08/15/2020, 06/21/2021   PFIZER(Purple Top)SARS-COV-2 Vaccination 06/21/2021   Pneumococcal Conjugate-13 08/18/2014   Pneumococcal Polysaccharide-23 07/26/2010   Tdap 11/12/2018   Pertinent  Health Maintenance Due  Topic Date Due   INFLUENZA VACCINE  Completed   DEXA SCAN  Discontinued      08/12/2019    2:51 PM 09/29/2021    1:43 PM 10/12/2022    9:50 AM 10/12/2022    2:29 PM 10/26/2022    9:07 AM  [place] the past year? 0 1 0 0 0  Was there an injury with Fall?  0 0 0 0  Fall Risk Category Calculator  1 0 0 0  Fall Risk Category (Retired)  Low Low Low   (RETIRED)  Patient Fall Risk Level   Low fall risk High fall risk   Patient at Risk for Falls Due to  History of fall(s);Impaired balance/gait;Orthopedic patient No Fall Risks History of fall(s);Impaired balance/gait;Impaired mobility History of fall(s);Impaired balance/gait;Impaired mobility  Fall risk Follow up  Falls evaluation completed;Education provided;Falls prevention discussed Falls evaluation completed Falls evaluation completed;Education provided;Falls prevention discussed Falls evaluation completed   Functional Status Survey:    Vitals:   11/22/22 1019  BP: 121/88  Pulse: 74  Resp: 20  Temp: (!) 97.2 F (36.2 C)  TempSrc: Temporal  SpO2: 92%  Weight: 123 lb (55.8 kg)  Height: 5' (1.524 m)   Body mass index is 24.02 kg/m. Physical Exam Vitals reviewed.  Constitutional:      Appearance: Normal appearance.  HENT:     Head: Normocephalic.     Nose: Nose normal.     Mouth/Throat:     Mouth: Mucous membranes are moist.     Pharynx: Oropharynx is clear.  Eyes:     Pupils: Pupils are equal, round, and reactive to light.  Cardiovascular:      Rate and Rhythm: Normal rate and regular rhythm.     Pulses: Normal pulses.     Heart sounds: Normal heart sounds. No murmur heard. Pulmonary:     Effort: Pulmonary effort is normal.     Breath sounds: Normal breath sounds.  Abdominal:     General: Abdomen is flat. Bowel sounds are normal.     Palpations: Abdomen is soft.  Musculoskeletal:        General: Swelling present.     Cervical back: Neck supple.  Skin:    General: Skin is warm.  Neurological:     Mental Status: She is alert and oriented to person, place, and time.  Psychiatric:        Mood and Affect: Mood normal.        Thought Content: Thought content normal.     Labs reviewed: Recent Labs    03/05/22 0000 03/15/22 0000 10/04/22 0940  NA 137 136* 139  K 3.5 3.7 3.6  CL 98* 97* 101  CO2 32* 34* 30*  BUN 32* 32* 31*  CREATININE 0.9 1.0 1.0  CALCIUM 9.2 9.6 9.1   Recent Labs    10/04/22 0940  AST 15  ALT 9  ALKPHOS 55  ALBUMIN 3.4*   Recent Labs    10/04/22 0940  WBC 7.3  NEUTROABS 4,256.00  HGB 12.1  HCT 36  PLT 199   Lab Results  Component Value Date   TSH 2.01 11/02/2021   No results found for: "HGBA1C" Lab Results  Component Value Date   CHOL 175 07/23/2019   HDL 54 07/23/2019   LDLCALC 105 (H) 07/23/2019   TRIG 74 07/23/2019   CHOLHDL 3.2 07/23/2019    Significant Diagnostic Results in last 30 days:  No results found.  Assessment/Plan 1. Bilateral leg edema On Low dose of Zaroxolyn Creat stable  2. Essential hypertension metoprolol  3. Stage 3a chronic kidney disease (HCC) Creat stable  4. Painful micturition Multiple Cultures have been negative Her POA does not want any more work up Elavil at night to see if it helps her to sleep I will try Some Lidocaine 2 % ointment Apply in vaginal area  for few days to see if it helps  5. RLS (restless legs syndrome) Requip  6. Primary osteoarthritis involving multiple joints Tylenol  7. Gait abnormality Uses Power  chair    Family/  staff Communication:   Labs/tests ordered:

## 2022-12-19 ENCOUNTER — Encounter: Payer: Self-pay | Admitting: Orthopedic Surgery

## 2022-12-19 ENCOUNTER — Non-Acute Institutional Stay (SKILLED_NURSING_FACILITY): Payer: PPO | Admitting: Orthopedic Surgery

## 2022-12-19 DIAGNOSIS — R309 Painful micturition, unspecified: Secondary | ICD-10-CM | POA: Diagnosis not present

## 2022-12-19 DIAGNOSIS — G2581 Restless legs syndrome: Secondary | ICD-10-CM

## 2022-12-19 DIAGNOSIS — R600 Localized edema: Secondary | ICD-10-CM

## 2022-12-19 DIAGNOSIS — R269 Unspecified abnormalities of gait and mobility: Secondary | ICD-10-CM | POA: Diagnosis not present

## 2022-12-19 DIAGNOSIS — R413 Other amnesia: Secondary | ICD-10-CM | POA: Diagnosis not present

## 2022-12-19 DIAGNOSIS — M159 Polyosteoarthritis, unspecified: Secondary | ICD-10-CM | POA: Diagnosis not present

## 2022-12-19 DIAGNOSIS — N1831 Chronic kidney disease, stage 3a: Secondary | ICD-10-CM

## 2022-12-19 DIAGNOSIS — H6123 Impacted cerumen, bilateral: Secondary | ICD-10-CM | POA: Diagnosis not present

## 2022-12-19 DIAGNOSIS — I10 Essential (primary) hypertension: Secondary | ICD-10-CM | POA: Diagnosis not present

## 2022-12-19 NOTE — Progress Notes (Signed)
Location:   [facility] Room Number: 1-A Place of Service:  SNF [phone]) Provider:  Windell Moulding, NP  PCP: Virgie Dad, MD  Patient Care Team: Virgie Dad, MD as PCP - General (Internal Medicine) Wardell Honour, MD as Attending Physician [facility] Medicine)  Extended Emergency Contact Information Primary Emergency Contact: [facility] Address: [place] [address] [place] Phone: [phone] Mobile Phone: [phone] Relation: Son Secondary Emergency Contact: [facility] Address: [place] [address]          [place], [place] [address] [place] Phone: [phone] Relation: Son  Code Status:  DNR Goals of care: Advanced Directive information    12/19/2022    9:42 AM  Advanced Directives  Does Patient Have a Medical Advance Directive? Yes  Type of Paramedic of [place];Living will;Out of facility DNR (pink MOST or yellow form)  Does patient want to make changes to medical advance directive? No - Patient declined  Copy of [place] in Chart? Yes - validated most recent copy scanned in chart (See row information)     Chief Complaint  Patient presents with   Medical Management of Chronic Issues    Routine Visit.    HPI:  Pt is a 89 y.o. female seen today for medical management of chronic diseases.    She currently resides on the skilled nursing unit at [facility]. PMH: hypertension, PVD, osteoarthritis of multiple joints, CKD stage 3, posterior tendon dysfunction with right foot drop, constipation, restless leg syndrome and anxiety.     HTN-  BUN/creat 31/1.0 10/04/2022, remains on metoprolol CKD- GFR 53 (01/04)> 49 (03/2022) Memory impairment- BIMS 15/15 (10/08/2022), no behaviors, more forgetful, asking same questions each encounter, not on medication Painful micturition/ vaginal dryness- ongoing, unsuccessful trial of Uribel 06/2021  and Estrace (breast tenderness), past urine cultures unremarkable, refused urology consult, remains on amitriptyline and pyridium prn, symptoms improved with good hydration BLE- R>L, given metolazone/potassium twice weekly, unsuccessful trial of leg wrappings, does not always elevate legs in recliner, BUN/creat 31/1.0, K+ 3.6 10/04/2022 OA- remains on scheduled tylenol  RLS- remains on requip qhs Gait abnormality- ambulates with PWC  Recent blood pressures:  03/09- 114/89  03/12- 132/78  03/05- 119/68  Recent weights:  03/03- 120.5 lbs  02/01- 123 lbs  01/09- 123.9 lbs    Past Medical History:  Diagnosis Date   Advanced care planning/counseling discussion 11/18/2018   Anemia    Closed compression fracture of L5 lumbar vertebra, sequela 09/08/2019   Hypertension    OA (osteoarthritis)    Obesity (BMI 30-39.9) 01/20/2018   Osteoporosis    Renal cyst    UTI (urinary tract infection) 11/28/2020   11/28/20 urine culture, Citrobacter, Keflex 500mg  tid x 7 day.    Weight gain 11/25/2018   Past Surgical History:  Procedure Laterality Date   APPENDECTOMY     BLADDER SUSPENSION     CATARACT EXTRACTION     CESAREAN SECTION     REPLACEMENT TOTAL KNEE  1995   TONSILLECTOMY      No Known Allergies  Allergies as of 12/19/2022   No Known Allergies      Medication List        Accurate as of December 19, 2022  9:42 AM. If you have any questions, ask your nurse or doctor.          acetaminophen  325 MG tablet Commonly known as: TYLENOL Take 650 mg by mouth every 4 (four) hours as needed.   acetaminophen 325 MG tablet Commonly known as: TYLENOL Take 650 mg by mouth at bedtime.   amitriptyline 10 MG tablet Commonly known as: ELAVIL Take 10 mg by mouth at bedtime.   aspirin EC 81 MG tablet Take 81 mg by mouth daily. Mondays and Thursdays   Biotin 5000 MCG Tabs Take 5,000 mcg by mouth daily.   bisacodyl 10 MG suppository Commonly known as: DULCOLAX Place 10 mg  rectally every other day. As needed   GenTeal Tears Night-Time Oint Apply to eye as directed. Instill 0.25 inch in both eyes at bedtime for Dry eyes to conjunctival sac   magnesium hydroxide 400 MG/5ML suspension Commonly known as: MILK OF MAGNESIA Take 30 mLs by mouth daily as needed for mild constipation.   metolazone 2.5 MG tablet Commonly known as: ZAROXOLYN Take 2.5 mg by mouth 2 (two) times a week. On Wed. And Sat. only   metoprolol tartrate 50 MG tablet Commonly known as: LOPRESSOR TAKE 1 TABLET BY MOUTH TWICE DAILY.   multivitamin-lutein Caps capsule Take 1 capsule by mouth 2 (two) times daily.   OcuSoft Eyelid Cleansing Pads Apply 1 Pad topically every morning.   phenazopyridine 100 MG tablet Commonly known as: PYRIDIUM Take 100 mg by mouth daily as needed for pain. Give one by mouth every 24 hours as needed for painful dysuria   polyethylene glycol 17 g packet Commonly known as: MIRALAX / GLYCOLAX Take 17 g by mouth daily.   polyvinyl alcohol 1.4 % ophthalmic solution Commonly known as: LIQUIFILM TEARS Place 1 drop into both eyes 3 (three) times daily.   Potassium Chloride ER 20 MEQ Tbcr Take 1 tablet by mouth 2 (two) times a week. On Wed. And Sat.   rOPINIRole 1 MG tablet Commonly known as: REQUIP TAKE 1 TABLET EACH EVENING.   sennosides-docusate sodium 8.6-50 MG tablet Commonly known as: SENOKOT-S Take 2 tablets by mouth daily. as needed for constipation at bedtime.   Vitamin D 50 MCG (2000 UT) Caps Take 2,000 Units by mouth daily.   zinc oxide 20 % ointment Apply 1 application topically as needed for irritation. Special Instructions: To buttocks after every incontinent episode and as needed for redness. May keep at bedside.        Review of Systems  Constitutional:  Negative for activity change and appetite change.  HENT:  Positive for hearing loss. Negative for trouble swallowing.   Eyes:  Negative for visual disturbance.  Respiratory:   Negative for cough, shortness of breath and wheezing.   Cardiovascular:  Positive for leg swelling. Negative for chest pain.  Gastrointestinal:  Negative for abdominal distention and abdominal pain.  Genitourinary:  Positive for dysuria. Negative for frequency.  Musculoskeletal:  Positive for arthralgias, back pain and gait problem.  Skin:  Negative for wound.  Neurological:  Positive for weakness. Negative for dizziness and headaches.  Psychiatric/Behavioral:  Positive for dysphoric mood. Negative for confusion and sleep disturbance. The patient is not nervous/anxious.     Immunization History  Administered Date(s) Administered   Covid-19,MRNA Vaccine(Spikevax)30mos. thru 15yrs 08/07/2022   Fluad Quad(high Dose 65+) 07/25/2022   Influenza, High Dose Seasonal PF 07/10/2017, 07/15/2019, 07/05/2020, 07/25/2021   Influenza,inj,Quad PF,6+ Mos 07/03/2018   Influenza-Unspecified 07/04/2015, 07/10/2017   Moderna SARS-COV2 Booster Vaccination 03/08/2021, 03/14/2022   Moderna Sars-Covid-2 Vaccination 10/05/2019, 11/02/2019, 08/15/2020, 06/21/2021   PFIZER(Purple Top)SARS-COV-2 Vaccination 06/21/2021   Pneumococcal Conjugate-13  08/18/2014   Pneumococcal Polysaccharide-23 07/26/2010   Tdap 11/12/2018   Pertinent  Health Maintenance Due  Topic Date Due   INFLUENZA VACCINE  Completed   DEXA SCAN  Discontinued      08/12/2019    2:51 PM 09/29/2021    1:43 PM 10/12/2022    9:50 AM 10/12/2022    2:29 PM 10/26/2022    9:07 AM  [place] in the past year? 0 1 0 0 0  Was there an injury with Fall?  0 0 0 0  Fall Risk Category Calculator  1 0 0 0  Fall Risk Category (Retired)  Low Low Low   (RETIRED) Patient Fall Risk Level   Low fall risk High fall risk   Patient at Risk for Falls Due to  History of fall(s);Impaired balance/gait;Orthopedic patient No Fall Risks History of fall(s);Impaired balance/gait;Impaired mobility History of fall(s);Impaired balance/gait;Impaired mobility  Fall risk  Follow up  Falls evaluation completed;Education provided;Falls prevention discussed Falls evaluation completed Falls evaluation completed;Education provided;Falls prevention discussed Falls evaluation completed   Functional Status Survey:    Vitals:   12/19/22 0935  BP: 114/89  Pulse: 68  Resp: 20  Temp: 97.8 F (36.6 C)  SpO2: 95%  Weight: 120 lb 8 oz (54.7 kg)  Height: 5' (1.524 m)   Body mass index is 23.53 kg/m. Physical Exam Vitals reviewed.  Constitutional:      General: She is not in acute distress. HENT:     Head: Normocephalic.     Right Ear: There is impacted cerumen.     Left Ear: There is impacted cerumen.     Nose: Nose normal.     Mouth/Throat:     Mouth: Mucous membranes are moist.  Eyes:     General:        Right eye: No discharge.        Left eye: No discharge.  Cardiovascular:     Rate and Rhythm: Normal rate and regular rhythm.     Pulses: Normal pulses.     Heart sounds: Murmur heard.  Pulmonary:     Effort: Pulmonary effort is normal. No respiratory distress.     Breath sounds: Normal breath sounds. No wheezing.  Abdominal:     General: Bowel sounds are normal. There is no distension.     Palpations: Abdomen is soft.     Tenderness: There is no abdominal tenderness.  Musculoskeletal:     Cervical back: Neck supple.     Right lower leg: Edema present.     Left lower leg: Edema present.     Comments: Non pitting, kyphosis  Skin:    General: Skin is warm and dry.     Capillary Refill: Capillary refill takes less than 2 seconds.  Neurological:     General: No focal deficit present.     Mental Status: She is alert and oriented to person, place, and time.     Motor: Weakness present.     Gait: Gait abnormal.  Psychiatric:        Mood and Affect: Mood normal.     Comments: Follows commands, alert to self/person/place, very pleasant     Labs reviewed: Recent Labs    03/05/22 0000 03/15/22 0000 10/04/22 0940  NA 137 136* 139  K 3.5 3.7  3.6  CL 98* 97* 101  CO2 32* 34* 30*  BUN 32* 32* 31*  CREATININE 0.9 1.0 1.0  CALCIUM 9.2 9.6 9.1   Recent Labs  10/04/22 0940  AST 15  ALT 9  ALKPHOS 55  ALBUMIN 3.4*   Recent Labs    10/04/22 0940  WBC 7.3  NEUTROABS 4,256.00  HGB 12.1  HCT 36  PLT 199   Lab Results  Component Value Date   TSH 2.01 11/02/2021   No results found for: "HGBA1C" Lab Results  Component Value Date   CHOL 175 07/23/2019   HDL 54 07/23/2019   LDLCALC 105 (H) 07/23/2019   TRIG 74 07/23/2019   CHOLHDL 3.2 07/23/2019    Significant Diagnostic Results in last 30 days:  No results found.  Assessment/Plan 1. Bilateral impacted cerumen - start debrox 5 gtts- apply to both ears BID x 5 days - flush ears with water when complete  2. Essential hypertension - controlled - cont metoprolol  3. Stage 3a chronic kidney disease (HCC) - on metolazone 2x/week - avoid nephrotoxic drugs like NSAIDS and dose adjust medications to be renally excreted - encourage hydration with water   4. Memory impairment - forgetful at times - starting to repeat same questions each encounter - recent BIMS 15/15 - consider ST evaluation for cognitive testing if memory worsens  5. Painful micturition - ongoing - c/o vaginal dryness> asked for Vaseline to vaginal fold qhs - does not drink water well - past urine cultures unremarkable - cont amitriptyline and pyridium prn  6. Bilateral leg edema - non pitting - sedentary most of day - cont metolazone/KCL 2x/week  7. Primary osteoarthritis involving multiple joints - cont scheduled tyelnol  8. RLS (restless legs syndrome) - cont Requip  9. Gait abnormality - cont skilled nursing     Family/ staff Communication: plan discussed with patient and nurse  Labs/tests ordered: none

## 2022-12-24 ENCOUNTER — Encounter: Payer: Self-pay | Admitting: Orthopedic Surgery

## 2022-12-24 ENCOUNTER — Non-Acute Institutional Stay (SKILLED_NURSING_FACILITY): Payer: PPO | Admitting: Orthopedic Surgery

## 2022-12-24 DIAGNOSIS — S8012XA Contusion of left lower leg, initial encounter: Secondary | ICD-10-CM | POA: Diagnosis not present

## 2022-12-24 DIAGNOSIS — W19XXXA Unspecified fall, initial encounter: Secondary | ICD-10-CM

## 2022-12-24 DIAGNOSIS — M25561 Pain in right knee: Secondary | ICD-10-CM

## 2022-12-24 MED ORDER — TRAMADOL HCL 50 MG PO TABS: 25.0000 mg | ORAL_TABLET | Freq: Two times a day (BID) | ORAL | 0 refills | Status: DC | PRN

## 2022-12-24 NOTE — Progress Notes (Unsigned)
Location:   [place] Room Number: 1-A Place of Service:  SNF [phone]) Provider:  Windell Moulding, NP  PCP: Virgie Dad, MD  Patient Care Team: Virgie Dad, MD as PCP - General (Internal Medicine) Wardell Honour, MD as Attending Physician [facility] Medicine)  Extended Emergency Contact Information Primary Emergency Contact: [facility] Address: [address] [address] [place] Phone: [phone] Mobile Phone: [phone] Relation: Son Secondary Emergency Contact: [facility] Address: [address]          [place], [place] [address] [place] Phone: [phone] Relation: Son  Code Status:  DNR Goals of care: Advanced Directive information    12/24/2022    4:07 PM  Advanced Directives  Does Patient Have a Medical Advance Directive? Yes  Type of Paramedic of [place];Living will;Out of facility DNR (pink MOST or yellow form)  Does patient want to make changes to medical advance directive? No - Patient declined  Copy of [place] in Chart? Yes - validated most recent copy scanned in chart (See row information)     Chief Complaint  Patient presents with   Acute Visit    Fall    HPI:  Pt is a 89 y.o. female seen today for an acute visit for    Past Medical History:  Diagnosis Date   Advanced care planning/counseling discussion 11/18/2018   Anemia    Closed compression fracture of L5 lumbar vertebra, sequela 09/08/2019   Hypertension    OA (osteoarthritis)    Obesity (BMI 30-39.9) 01/20/2018   Osteoporosis    Renal cyst    UTI (urinary tract infection) 11/28/2020   11/28/20 urine culture, Citrobacter, Keflex 500mg  tid x 7 day.    Weight gain 11/25/2018   Past Surgical History:  Procedure Laterality Date   APPENDECTOMY     BLADDER SUSPENSION     CATARACT EXTRACTION     CESAREAN SECTION     REPLACEMENT TOTAL KNEE  1995    TONSILLECTOMY      No Known Allergies  Allergies as of 12/24/2022   No Known Allergies      Medication List        Accurate as of December 24, 2022  4:21 PM. If you have any questions, ask your nurse or doctor.          acetaminophen 325 MG tablet Commonly known as: TYLENOL Take 650 mg by mouth every 4 (four) hours as needed.   acetaminophen 325 MG tablet Commonly known as: TYLENOL Take 650 mg by mouth at bedtime.   amitriptyline 10 MG tablet Commonly known as: ELAVIL Take 10 mg by mouth at bedtime.   aspirin EC 81 MG tablet Take 81 mg by mouth daily. Mondays and Thursdays   Biotin 5000 MCG Tabs Take 5,000 mcg by mouth daily.   bisacodyl 10 MG suppository Commonly known as: DULCOLAX Place 10 mg rectally every other day. As needed   GenTeal Tears Night-Time Oint Apply to eye as directed. Instill 0.25 inch in both eyes at bedtime for Dry eyes to conjunctival sac   magnesium hydroxide 400 MG/5ML suspension Commonly known as: MILK OF MAGNESIA Take 30 mLs by mouth daily as needed for mild constipation.   metolazone 2.5 MG tablet Commonly known as: ZAROXOLYN Take 2.5 mg by mouth 2 (two) times a week. On Wed. And Sat. only   metoprolol tartrate  50 MG tablet Commonly known as: LOPRESSOR TAKE 1 TABLET BY MOUTH TWICE DAILY.   multivitamin-lutein Caps capsule Take 1 capsule by mouth 2 (two) times daily.   OcuSoft Eyelid Cleansing Pads Apply 1 Pad topically every morning.   phenazopyridine 100 MG tablet Commonly known as: PYRIDIUM Take 100 mg by mouth daily as needed for pain. Give one by mouth every 24 hours as needed for painful dysuria   polyethylene glycol 17 g packet Commonly known as: MIRALAX / GLYCOLAX Take 17 g by mouth daily.   polyvinyl alcohol 1.4 % ophthalmic solution Commonly known as: LIQUIFILM TEARS Place 1 drop into both eyes 3 (three) times daily.   Potassium Chloride ER 20 MEQ Tbcr Take 1 tablet by mouth 2 (two) times a week. On Wed. And  Sat.   rOPINIRole 1 MG tablet Commonly known as: REQUIP TAKE 1 TABLET EACH EVENING.   sennosides-docusate sodium 8.6-50 MG tablet Commonly known as: SENOKOT-S Take 2 tablets by mouth daily. as needed for constipation at bedtime.   traMADol 50 MG tablet Commonly known as: ULTRAM Take 0.5 tablets (25 mg total) by mouth every 12 (twelve) hours as needed for up to 7 days. Started by: Yvonna Alanis, NP   Vitamin D 50 MCG (2000 UT) Caps Take 2,000 Units by mouth daily.   zinc oxide 20 % ointment Apply 1 application topically as needed for irritation. Special Instructions: To buttocks after every incontinent episode and as needed for redness. May keep at bedside.        Review of Systems  Immunization History  Administered Date(s) Administered   Covid-19,MRNA Vaccine(Spikevax)59mos. thru 18yrs 08/07/2022   Fluad Quad(high Dose 65+) 07/25/2022   Influenza, High Dose Seasonal PF 07/10/2017, 07/15/2019, 07/05/2020, 07/25/2021   Influenza,inj,Quad PF,6+ Mos 07/03/2018   Influenza-Unspecified 07/04/2015, 07/10/2017   Moderna SARS-COV2 Booster Vaccination 03/08/2021, 03/14/2022   Moderna Sars-Covid-2 Vaccination 10/05/2019, 11/02/2019, 08/15/2020, 06/21/2021   PFIZER(Purple Top)SARS-COV-2 Vaccination 06/21/2021   Pneumococcal Conjugate-13 08/18/2014   Pneumococcal Polysaccharide-23 07/26/2010   Tdap 11/12/2018   Pertinent  Health Maintenance Due  Topic Date Due   INFLUENZA VACCINE  Completed   DEXA SCAN  Discontinued      08/12/2019    2:51 PM 09/29/2021    1:43 PM 10/12/2022    9:50 AM 10/12/2022    2:29 PM 10/26/2022    9:07 AM  [place] in the past year? 0 1 0 0 0  Was there an injury with Fall?  0 0 0 0  Fall Risk Category Calculator  1 0 0 0  Fall Risk Category (Retired)  Low Low Low   (RETIRED) Patient Fall Risk Level   Low fall risk High fall risk   Patient at Risk for Falls Due to  History of fall(s);Impaired balance/gait;Orthopedic patient No Fall Risks  History of fall(s);Impaired balance/gait;Impaired mobility History of fall(s);Impaired balance/gait;Impaired mobility  Fall risk Follow up  Falls evaluation completed;Education provided;Falls prevention discussed Falls evaluation completed Falls evaluation completed;Education provided;Falls prevention discussed Falls evaluation completed   Functional Status Survey:    Vitals:   12/24/22 1606  BP: 114/89  Pulse: 68  Resp: 20  Temp: 97.8 F (36.6 C)  SpO2: 95%  Weight: 120 lb 8 oz (54.7 kg)  Height: 5' (1.524 m)   Body mass index is 23.53 kg/m. Physical Exam  Labs reviewed: Recent Labs    03/05/22 0000 03/15/22 0000 10/04/22 0940  NA 137 136* 139  K 3.5 3.7 3.6  CL 98* 97*  101  CO2 32* 34* 30*  BUN 32* 32* 31*  CREATININE 0.9 1.0 1.0  CALCIUM 9.2 9.6 9.1   Recent Labs    10/04/22 0940  AST 15  ALT 9  ALKPHOS 55  ALBUMIN 3.4*   Recent Labs    10/04/22 0940  WBC 7.3  NEUTROABS 4,256.00  HGB 12.1  HCT 36  PLT 199   Lab Results  Component Value Date   TSH 2.01 11/02/2021   No results found for: "HGBA1C" Lab Results  Component Value Date   CHOL 175 07/23/2019   HDL 54 07/23/2019   LDLCALC 105 (H) 07/23/2019   TRIG 74 07/23/2019   CHOLHDL 3.2 07/23/2019    Significant Diagnostic Results in last 30 days:  No results found.  Assessment/Plan 1. Acute pain of right knee *** - traMADol (ULTRAM) 50 MG tablet; Take 0.5 tablets (25 mg total) by mouth every 12 (twelve) hours as needed for up to 7 days.  Dispense: 7 tablet; Refill: 0  2. Fall, initial encounter ***  3. Leg hematoma, left, initial encounter ***    Family/ staff Communication:   Labs/tests ordered:

## 2022-12-26 ENCOUNTER — Non-Acute Institutional Stay (SKILLED_NURSING_FACILITY): Payer: PPO | Admitting: Orthopedic Surgery

## 2022-12-26 ENCOUNTER — Encounter: Payer: Self-pay | Admitting: Orthopedic Surgery

## 2022-12-26 DIAGNOSIS — M25561 Pain in right knee: Secondary | ICD-10-CM

## 2022-12-26 DIAGNOSIS — W19XXXD Unspecified fall, subsequent encounter: Secondary | ICD-10-CM

## 2022-12-26 DIAGNOSIS — R630 Anorexia: Secondary | ICD-10-CM | POA: Diagnosis not present

## 2022-12-26 DIAGNOSIS — S8012XS Contusion of left lower leg, sequela: Secondary | ICD-10-CM

## 2022-12-26 NOTE — Progress Notes (Signed)
Location:  [place] Room Number: 1/A Place of Service:  SNF [phone]) Provider:  Yvonna Alanis, NP   Virgie Dad, MD  Patient Care Team: Virgie Dad, MD as PCP - General (Internal Medicine) Wardell Honour, MD as Attending Physician [facility] Medicine)  Extended Emergency Contact Information Primary Emergency Contact: [facility] Address: [address] [address] [place] Phone: [phone] Mobile Phone: [phone] Relation: Son Secondary Emergency Contact: [facility] Address: [address]          [place], [place] [address] [place] Phone: [phone] Relation: Son  Code Status:  DNR Goals of care: Advanced Directive information    12/24/2022    4:07 PM  Advanced Directives  Does Patient Have a Medical Advance Directive? Yes  Type of Paramedic of [place];Living will;Out of facility DNR (pink MOST or yellow form)  Does patient want to make changes to medical advance directive? No - Patient declined  Copy of [place] in Chart? Yes - validated most recent copy scanned in chart (See row information)     Chief Complaint  Patient presents with   Acute Visit    Poor appetite    HPI:  Pt is a 88 y.o. female seen today for acute visit due to poor appetite.   She currently resides on the skilled nursing unit at [facility]. PMH: hypertension, PVD, osteoarthritis of multiple joints, CKD stage 3, posterior tendon dysfunction with right foot drop, constipation, restless leg syndrome and anxiety.    03/25 she slid out of [facility] and injured lower extremities. Since fall, she has not been hungry. Her son brought her a milkshake yesterday and she was able to drink half. This morning she refused breakfast. She was offered Boost shake and was able to drink the whole drink. Denies N/V, dental pain, dysphagia, constipation or abdominal pain. LBM  03/27, 03/26, 03/25.   She continues to have increased pain to lower extremities. Movement increases pain. She was started on tramadol prn. She reports tylenol is effective in reducing pain. Nursing has been giving her heat applications to left leg and cold pack to right. Aspirin was held x 3 days. She has not been out of bed since fall due to pain. HPOA does not want xray to lower extremities at this time.      Past Medical History:  Diagnosis Date   Advanced care planning/counseling discussion 11/18/2018   Anemia    Closed compression fracture of L5 lumbar vertebra, sequela 09/08/2019   Hypertension    OA (osteoarthritis)    Obesity (BMI 30-39.9) 01/20/2018   Osteoporosis    Renal cyst    UTI (urinary tract infection) 11/28/2020   11/28/20 urine culture, Citrobacter, Keflex 500mg  tid x 7 day.    Weight gain 11/25/2018   Past Surgical History:  Procedure Laterality Date   APPENDECTOMY     BLADDER SUSPENSION     CATARACT EXTRACTION     CESAREAN SECTION     REPLACEMENT TOTAL KNEE  1995   TONSILLECTOMY      No Known Allergies  Outpatient Encounter Medications as of 12/26/2022  Medication Sig   acetaminophen (TYLENOL) 325 MG tablet Take 650 mg by mouth every 4 (four) hours as needed.   acetaminophen (TYLENOL) 325 MG tablet Take 650 mg by mouth at bedtime.   amitriptyline (ELAVIL) 10 MG tablet Take 10  mg by mouth at bedtime.   aspirin EC 81 MG tablet Take 81 mg by mouth daily. Mondays and Thursdays   Biotin 5000 MCG TABS Take 5,000 mcg by mouth daily.    bisacodyl (DULCOLAX) 10 MG suppository Place 10 mg rectally every other day. As needed   Cholecalciferol (VITAMIN D) 50 MCG (2000 UT) CAPS Take 2,000 Units by mouth daily.    Eyelid Cleansers (OCUSOFT EYELID CLEANSING) PADS Apply 1 Pad topically every morning.   magnesium hydroxide (MILK OF MAGNESIA) 400 MG/5ML suspension Take 30 mLs by mouth daily as needed for mild constipation.   metolazone (ZAROXOLYN) 2.5 MG tablet Take 2.5  mg by mouth 2 (two) times a week. On Wed. And Sat. only   metoprolol tartrate (LOPRESSOR) 50 MG tablet TAKE 1 TABLET BY MOUTH TWICE DAILY.   multivitamin-lutein (OCUVITE-LUTEIN) CAPS capsule Take 1 capsule by mouth 2 (two) times daily.    phenazopyridine (PYRIDIUM) 100 MG tablet Take 100 mg by mouth daily as needed for pain. Give one by mouth every 24 hours as needed for painful dysuria   polyethylene glycol (MIRALAX / GLYCOLAX) 17 g packet Take 17 g by mouth daily.   polyvinyl alcohol (LIQUIFILM TEARS) 1.4 % ophthalmic solution Place 1 drop into both eyes 3 (three) times daily.   Potassium Chloride ER 20 MEQ TBCR Take 1 tablet by mouth 2 (two) times a week. On Wed. And Sat.   rOPINIRole (REQUIP) 1 MG tablet TAKE 1 TABLET EACH EVENING.   sennosides-docusate sodium (SENOKOT-S) 8.6-50 MG tablet Take 2 tablets by mouth daily. as needed for constipation at bedtime.   traMADol (ULTRAM) 50 MG tablet Take 0.5 tablets (25 mg total) by mouth every 12 (twelve) hours as needed for up to 7 days.   White Petrolatum-Mineral Oil (GENTEAL TEARS NIGHT-TIME) OINT Apply to eye as directed. Instill 0.25 inch in both eyes at bedtime for Dry eyes to conjunctival sac   zinc oxide 20 % ointment Apply 1 application topically as needed for irritation. Special Instructions: To buttocks after every incontinent episode and as needed for redness. May keep at bedside.   No facility-administered encounter medications on file as of 12/26/2022.    Review of Systems  Constitutional:  Positive for appetite change. Negative for activity change.  HENT:  Negative for congestion and trouble swallowing.   Eyes:  Negative for visual disturbance.  Respiratory:  Negative for cough, shortness of breath and wheezing.   Cardiovascular:  Positive for leg swelling. Negative for chest pain.  Gastrointestinal:  Negative for constipation, diarrhea, nausea and vomiting.  Genitourinary:  Positive for dysuria. Negative for hematuria.   Musculoskeletal:  Positive for arthralgias, back pain and gait problem.  Skin:  Positive for color change and wound.  Neurological:  Positive for weakness. Negative for dizziness and headaches.  Psychiatric/Behavioral:  Negative for confusion and dysphoric mood. The patient is not nervous/anxious.     Immunization History  Administered Date(s) Administered   Covid-19,MRNA Vaccine(Spikevax)15mos. thru 46yrs 08/07/2022   Fluad Quad(high Dose 65+) 07/25/2022   Influenza, High Dose Seasonal PF 07/10/2017, 07/15/2019, 07/05/2020, 07/25/2021   Influenza,inj,Quad PF,6+ Mos 07/03/2018   Influenza-Unspecified 07/04/2015, 07/10/2017   Moderna SARS-COV2 Booster Vaccination 03/08/2021, 03/14/2022   Moderna Sars-Covid-2 Vaccination 10/05/2019, 11/02/2019, 08/15/2020, 06/21/2021   PFIZER(Purple Top)SARS-COV-2 Vaccination 06/21/2021   Pneumococcal Conjugate-13 08/18/2014   Pneumococcal Polysaccharide-23 07/26/2010   Tdap 11/12/2018   Pertinent  Health Maintenance Due  Topic Date Due   INFLUENZA VACCINE  Completed   DEXA SCAN  Discontinued  08/12/2019    2:51 PM 09/29/2021    1:43 PM 10/12/2022    9:50 AM 10/12/2022    2:29 PM 10/26/2022    9:07 AM  Fall Risk  Falls in the past year? 0 1 0 0 0  Was there an injury with Fall?  0 0 0 0  Fall Risk Category Calculator  1 0 0 0  Fall Risk Category (Retired)  Low Low Low   (RETIRED) Patient Fall Risk Level   Low fall risk High fall risk   Patient at Risk for Falls Due to  History of fall(s);Impaired balance/gait;Orthopedic patient No Fall Risks History of fall(s);Impaired balance/gait;Impaired mobility History of fall(s);Impaired balance/gait;Impaired mobility  Fall risk Follow up  Falls evaluation completed;Education provided;Falls prevention discussed Falls evaluation completed Falls evaluation completed;Education provided;Falls prevention discussed Falls evaluation completed   Functional Status Survey:    Vitals:   12/26/22 1204  BP:  100/65  Pulse: 80  Resp: 19  Temp: 97.9 F (36.6 C)  SpO2: 91%  Weight: 120 lb 8 oz (54.7 kg)  Height: 5' (1.524 m)   Body mass index is 23.53 kg/m. Physical Exam Vitals reviewed.  Constitutional:      General: She is not in acute distress. HENT:     Head: Normocephalic.  Eyes:     General:        Right eye: No discharge.        Left eye: No discharge.  Cardiovascular:     Rate and Rhythm: Normal rate and regular rhythm.     Pulses:          Dorsalis pedis pulses are 1+ on the right side and 2+ on the left side.       Posterior tibial pulses are 1+ on the right side and 2+ on the left side.     Heart sounds: Normal heart sounds.  Pulmonary:     Effort: Pulmonary effort is normal. No respiratory distress.     Breath sounds: Normal breath sounds. No wheezing.  Abdominal:     General: Bowel sounds are normal. There is no distension.     Palpations: Abdomen is soft. There is no mass.     Tenderness: There is no abdominal tenderness. There is no guarding or rebound.     Hernia: No hernia is present.  Musculoskeletal:     Cervical back: Neck supple.     Right lower leg: Edema present.     Left lower leg: Edema present.  Feet:     Right foot:     Skin integrity: No skin breakdown.     Left foot:     Skin integrity: No skin breakdown.  Skin:    General: Skin is warm.     Capillary Refill: Capillary refill takes less than 2 seconds.     Findings: Bruising present.     Comments: Increased bruising/ swelling to right medial malleolus and posterior calf, RLE tender to touch. Approx 5 cm hematoma to left shin. Increased bruising and tenderness to left dorsal foot.   Neurological:     General: No focal deficit present.     Mental Status: She is alert. Mental status is at baseline.     Motor: Weakness present.     Gait: Gait abnormal.     Comments: Bed bound  Psychiatric:        Mood and Affect: Mood normal.        Behavior: Behavior normal.     Labs reviewed: Recent  Labs    03/05/22 0000 03/15/22 0000 10/04/22 0940  NA 137 136* 139  K 3.5 3.7 3.6  CL 98* 97* 101  CO2 32* 34* 30*  BUN 32* 32* 31*  CREATININE 0.9 1.0 1.0  CALCIUM 9.2 9.6 9.1   Recent Labs    10/04/22 0940  AST 15  ALT 9  ALKPHOS 55  ALBUMIN 3.4*   Recent Labs    10/04/22 0940  WBC 7.3  NEUTROABS 4,256.00  HGB 12.1  HCT 36  PLT 199   Lab Results  Component Value Date   TSH 2.01 11/02/2021   No results found for: "HGBA1C" Lab Results  Component Value Date   CHOL 175 07/23/2019   HDL 54 07/23/2019   LDLCALC 105 (H) 07/23/2019   TRIG 74 07/23/2019   CHOLHDL 3.2 07/23/2019    Significant Diagnostic Results in last 30 days:  No results found.  Assessment/Plan 1. Decreased appetite - BMI 23.53 - 03/25 fall with injury to BLE - decreased appetite since fall - exam unremarkable, abdomen soft - start Boost BID between meals - start weekly weights x 1 month, then monthly - Remeron 7.5 mg started in 2019 due to weight loss> discontinued 07/2021 - consider restarting if weight loss progressive  2. Acute pain of right knee - ongoing - movement increases pain - cont scheduled tylenol - cont Tramadol prn for breakthrough pain - HPOA no imaging at this time  3. Leg hematoma, left, sequela - 03/25 fall - cont heat applications prn - aspirin held x 3 days  4. Fall, subsequent encounter - 03/25 slid out of [place] while sleeping - bed bound at this time - cont skilled nursing     Family/ staff Communication: plan discussed with patient and nurse  Labs/tests ordered:  none

## 2022-12-27 ENCOUNTER — Telehealth: Payer: Self-pay | Admitting: Internal Medicine

## 2022-12-27 NOTE — Telephone Encounter (Signed)
Dr Sabra Heck concerned about his mom more Lethargic HE thinks it is probably due to Tramadol She is in Pain per Nurses We will schedule tylenol 650 mg Discontinue Elavil for now He does not want blood work No Transfer to hospital  She is not eating wither but hold off Remeron for now due to her [facility]

## 2022-12-31 ENCOUNTER — Non-Acute Institutional Stay (SKILLED_NURSING_FACILITY): Payer: PPO | Admitting: Orthopedic Surgery

## 2022-12-31 ENCOUNTER — Encounter: Payer: Self-pay | Admitting: Orthopedic Surgery

## 2022-12-31 DIAGNOSIS — M79605 Pain in left leg: Secondary | ICD-10-CM | POA: Diagnosis not present

## 2022-12-31 DIAGNOSIS — R400 Somnolence: Secondary | ICD-10-CM

## 2022-12-31 DIAGNOSIS — M79672 Pain in left foot: Secondary | ICD-10-CM

## 2022-12-31 DIAGNOSIS — M79671 Pain in right foot: Secondary | ICD-10-CM

## 2022-12-31 DIAGNOSIS — R630 Anorexia: Secondary | ICD-10-CM | POA: Diagnosis not present

## 2022-12-31 DIAGNOSIS — M79604 Pain in right leg: Secondary | ICD-10-CM | POA: Diagnosis not present

## 2022-12-31 MED ORDER — NORCO 5-325 MG PO TABS
0.5000 | ORAL_TABLET | Freq: Every morning | ORAL | 0 refills | Status: AC
Start: 2022-12-31 — End: 2023-01-08

## 2022-12-31 MED ORDER — HYDROCODONE-ACETAMINOPHEN 5-325 MG PO TABS
0.5000 | ORAL_TABLET | Freq: Two times a day (BID) | ORAL | 0 refills | Status: DC | PRN
Start: 2022-12-31 — End: 2023-01-14

## 2022-12-31 NOTE — Progress Notes (Signed)
Location:   [place] Room Number: 1-A Place of Service:  SNF [id number]) Provider:  Windell Moulding, NP  PCP: Virgie Dad, MD  Patient Care Team: Virgie Dad, MD as PCP - General (Internal Medicine) Wardell Honour, MD as Attending Physician [facility] Medicine)  Extended Emergency Contact Information Primary Emergency Contact: [facility] Address: [place] [address] [place] Phone: [phone] Mobile Phone: [phone] Relation: Son Secondary Emergency Contact: [facility] Address: [address]          [place], [place] [address] [place] Phone: [phone] Relation: Son  Code Status:  DNR Goals of care: Advanced Directive information    12/31/2022   10:40 AM  Advanced Directives  Does Patient Have a Medical Advance Directive? Yes  Type of Paramedic of [place];Living will;Out of facility DNR (pink MOST or yellow form)  Does patient want to make changes to medical advance directive? No - Patient declined  Copy of [place] in Chart? Yes - validated most recent copy scanned in chart (See row information)     Chief Complaint  Patient presents with   Acute Visit    Right foot pain.    HPI:  Pt is a 89 y.o. female seen today for acute visit due to ongoing bilateral foot pain.   03/25 she slid out of PWC and injured lower extremities. She was started on tramadol prn for increased pain. 03/28 family concerned with increased lethargy. Tramadol and Elavil were discontinued. She is a Electrical engineer. Nursing reports increased pain with crying over the weekend. She is on scheduled tylenol, but it does not appear to help with transfers. This morning I observed get into the recliner with lift. She experienced severe pain and was tearful. She rated pain 7/10, described as sharp/shooting. Family does not aggressive workup or future  hospitalizations.   She has also had decreased appetite since fall. Boost between meals ordered last week. She ate 25-50% of breakfast this morning. She did use Remeron in past, no plans to use at this time.    Past Medical History:  Diagnosis Date   Advanced care planning/counseling discussion 11/18/2018   Anemia    Closed compression fracture of L5 lumbar vertebra, sequela 09/08/2019   Hypertension    OA (osteoarthritis)    Obesity (BMI 30-39.9) 01/20/2018   Osteoporosis    Renal cyst    UTI (urinary tract infection) 11/28/2020   11/28/20 urine culture, Citrobacter, Keflex 500mg  tid x 7 day.    Weight gain 11/25/2018   Past Surgical History:  Procedure Laterality Date   APPENDECTOMY     BLADDER SUSPENSION     CATARACT EXTRACTION     CESAREAN SECTION     REPLACEMENT TOTAL KNEE  1995   TONSILLECTOMY      No Known Allergies  Allergies as of 12/31/2022   No Known Allergies      Medication List        Accurate as of December 31, 2022 10:40 AM. If you have any questions, ask your nurse or doctor.          STOP taking these medications    amitriptyline 10 MG tablet Commonly known as: ELAVIL Stopped by: Yvonna Alanis, NP   traMADol 50 MG tablet Commonly known as: ULTRAM Stopped by: Yvonna Alanis, NP       TAKE  these medications    acetaminophen 325 MG tablet Commonly known as: TYLENOL Take 650 mg by mouth every 4 (four) hours as needed.   acetaminophen 325 MG tablet Commonly known as: TYLENOL Take 650 mg by mouth at bedtime.   aspirin EC 81 MG tablet Take 81 mg by mouth daily. Mondays and Thursdays   Biotin 5000 MCG Tabs Take 5,000 mcg by mouth daily.   bisacodyl 10 MG suppository Commonly known as: DULCOLAX Place 10 mg rectally every other day. As needed   GenTeal Tears Night-Time Oint Apply to eye as directed. Instill 0.25 inch in both eyes at bedtime for Dry eyes to conjunctival sac   magnesium hydroxide 400 MG/5ML suspension Commonly known as:  MILK OF MAGNESIA Take 30 mLs by mouth daily as needed for mild constipation.   metolazone 2.5 MG tablet Commonly known as: ZAROXOLYN Take 2.5 mg by mouth 2 (two) times a week. On Wed. And Sat. only   metoprolol tartrate 50 MG tablet Commonly known as: LOPRESSOR TAKE 1 TABLET BY MOUTH TWICE DAILY.   multivitamin-lutein Caps capsule Take 1 capsule by mouth 2 (two) times daily.   OcuSoft Eyelid Cleansing Pads Apply 1 Pad topically every morning.   phenazopyridine 100 MG tablet Commonly known as: PYRIDIUM Take 100 mg by mouth daily as needed for pain. Give one by mouth every 24 hours as needed for painful dysuria   polyethylene glycol 17 g packet Commonly known as: MIRALAX / GLYCOLAX Take 17 g by mouth daily.   polyvinyl alcohol 1.4 % ophthalmic solution Commonly known as: LIQUIFILM TEARS Place 1 drop into both eyes 3 (three) times daily.   Potassium Chloride ER 20 MEQ Tbcr Take 1 tablet by mouth 2 (two) times a week. On Wed. And Sat.   rOPINIRole 1 MG tablet Commonly known as: REQUIP TAKE 1 TABLET EACH EVENING.   sennosides-docusate sodium 8.6-50 MG tablet Commonly known as: SENOKOT-S Take 2 tablets by mouth daily. as needed for constipation at bedtime.   Vaseline ointment Generic drug: white petrolatum Apply 1 Application topically at bedtime. Apply to vaginal folds topically for dryness.   Vitamin D 50 MCG (2000 UT) Caps Take 2,000 Units by mouth daily.   zinc oxide 20 % ointment Apply 1 application topically as needed for irritation. Special Instructions: To buttocks after every incontinent episode and as needed for redness. May keep at bedside.        Review of Systems  Constitutional:  Positive for appetite change and fatigue. Negative for activity change.  Respiratory:  Negative for cough, shortness of breath and wheezing.   Cardiovascular:  Positive for leg swelling. Negative for chest pain.  Gastrointestinal:  Negative for abdominal distention, abdominal  pain, constipation, diarrhea and nausea.  Musculoskeletal:  Positive for arthralgias, gait problem and myalgias.  Skin:  Positive for wound.  Neurological:  Positive for weakness. Negative for dizziness.  Psychiatric/Behavioral:  Positive for confusion and dysphoric mood. Negative for sleep disturbance. The patient is not nervous/anxious.     Immunization History  Administered Date(s) Administered   Covid-19,MRNA Vaccine(Spikevax)24mos. thru 5yrs 08/07/2022   Fluad Quad(high Dose 65+) 07/25/2022   Influenza, High Dose Seasonal PF 07/10/2017, 07/15/2019, 07/05/2020, 07/25/2021   Influenza,inj,Quad PF,6+ Mos 07/03/2018   Influenza-Unspecified 07/04/2015, 07/10/2017   Moderna SARS-COV2 Booster Vaccination 03/08/2021, 03/14/2022   Moderna Sars-Covid-2 Vaccination 10/05/2019, 11/02/2019, 08/15/2020, 06/21/2021   PFIZER(Purple Top)SARS-COV-2 Vaccination 06/21/2021   Pneumococcal Conjugate-13 08/18/2014   Pneumococcal Polysaccharide-23 07/26/2010   Tdap 11/12/2018   Pertinent  Health  Maintenance Due  Topic Date Due   INFLUENZA VACCINE  05/02/2023   DEXA SCAN  Discontinued      08/12/2019    2:51 PM 09/29/2021    1:43 PM 10/12/2022    9:50 AM 10/12/2022    2:29 PM 10/26/2022    9:07 AM  [place] in the past year? 0 1 0 0 0  Was there an injury with Fall?  0 0 0 0  Fall Risk Category Calculator  1 0 0 0  Fall Risk Category (Retired)  Low Low Low   (RETIRED) Patient Fall Risk Level   Low fall risk High fall risk   Patient at Risk for Falls Due to  History of fall(s);Impaired balance/gait;Orthopedic patient No Fall Risks History of fall(s);Impaired balance/gait;Impaired mobility History of fall(s);Impaired balance/gait;Impaired mobility  Fall risk Follow up  Falls evaluation completed;Education provided;Falls prevention discussed Falls evaluation completed Falls evaluation completed;Education provided;Falls prevention discussed Falls evaluation completed   Functional Status  Survey:    Vitals:   12/31/22 1030  BP: 136/66  Pulse: 76  Resp: 20  Temp: (!) 97 F (36.1 C)  SpO2: 96%  Weight: 120 lb 8 oz (54.7 kg)  Height: 5' (1.524 m)   Body mass index is 23.53 kg/m. Physical Exam Vitals reviewed.  Constitutional:      General: She is not in acute distress. HENT:     Head: Normocephalic.  Eyes:     General:        Right eye: No discharge.        Left eye: No discharge.  Cardiovascular:     Rate and Rhythm: Normal rate and regular rhythm.     Pulses: Normal pulses.     Heart sounds: Normal heart sounds.  Pulmonary:     Effort: Pulmonary effort is normal. No respiratory distress.     Breath sounds: Normal breath sounds. No wheezing.  Abdominal:     General: Bowel sounds are normal. There is no distension.     Palpations: Abdomen is soft.     Tenderness: There is no abdominal tenderness.  Musculoskeletal:     Cervical back: Neck supple.     Right lower leg: Edema present.     Left lower leg: Edema present.     Comments: Right foot drop  Skin:    General: Skin is warm and dry.     Capillary Refill: Capillary refill takes less than 2 seconds.     Comments: Large hematoma to LLE, scattered bruising to BLE   Neurological:     General: No focal deficit present.     Mental Status: She is alert. Mental status is at baseline.     Motor: Weakness present.     Gait: Gait abnormal.     Comments: hoyer  Psychiatric:        Mood and Affect: Mood normal.     Labs reviewed: Recent Labs    03/05/22 0000 03/15/22 0000 10/04/22 0940  NA 137 136* 139  K 3.5 3.7 3.6  CL 98* 97* 101  CO2 32* 34* 30*  BUN 32* 32* 31*  CREATININE 0.9 1.0 1.0  CALCIUM 9.2 9.6 9.1   Recent Labs    10/04/22 0940  AST 15  ALT 9  ALKPHOS 55  ALBUMIN 3.4*   Recent Labs    10/04/22 0940  WBC 7.3  NEUTROABS 4,256.00  HGB 12.1  HCT 36  PLT 199   Lab Results  Component Value Date  TSH 2.01 11/02/2021   No results found for: "HGBA1C" Lab Results   Component Value Date   CHOL 175 07/23/2019   HDL 54 07/23/2019   LDLCALC 105 (H) 07/23/2019   TRIG 74 07/23/2019   CHOLHDL 3.2 07/23/2019    Significant Diagnostic Results in last 30 days:  No results found.  Assessment/Plan 1. Bilateral leg and foot pain - ongoing - 03/25 fall out of PWC  - increased pain with transfers - discontinue tramadol - start 1/2 tablet norco QAM x 8 days - start 1/2 norco BID prn for breakthrough pain - NORCO 5-325 MG tablet; Take 0.5 tablets by mouth every morning for 8 days.  Dispense: 4 tablet; Refill: 0 - HYDROcodone-acetaminophen (NORCO) 5-325 MG tablet; Take 0.5 tablets by mouth 2 (two) times daily as needed for up to 14 days for moderate pain.  Dispense: 14 tablet; Refill: 0  2. Decreased appetite - started after fall 03/25 - 03/27 Boost between meals started - on Remeron in past> no plans to start at this time  3. Somnolence - 03/28 Tramadol and Elavil discontinued - alert today - she does take naps often during the day - no aggressive workup at this time    Family/ staff Communication: plan discussed with patient and nurse, attempted discuss with family but no answer   Labs/tests ordered:  none

## 2023-01-10 ENCOUNTER — Telehealth: Payer: Self-pay | Admitting: Internal Medicine

## 2023-01-10 MED ORDER — LORAZEPAM 0.5 MG PO TABS: 0.5000 mg | ORAL_TABLET | Freq: Two times a day (BID) | ORAL | 0 refills | Status: DC | PRN

## 2023-01-10 NOTE — Telephone Encounter (Signed)
Patient wanted something for anxiety Will try Ativan 0.5 mg BID PRN for anxiety.for 14 days then reeval Dr Hyacinth Meeker aware

## 2023-01-14 ENCOUNTER — Non-Acute Institutional Stay (SKILLED_NURSING_FACILITY): Payer: PPO | Admitting: Orthopedic Surgery

## 2023-01-14 ENCOUNTER — Encounter: Payer: Self-pay | Admitting: Orthopedic Surgery

## 2023-01-14 DIAGNOSIS — M25561 Pain in right knee: Secondary | ICD-10-CM

## 2023-01-14 DIAGNOSIS — R630 Anorexia: Secondary | ICD-10-CM | POA: Diagnosis not present

## 2023-01-14 DIAGNOSIS — S51811A Laceration without foreign body of right forearm, initial encounter: Secondary | ICD-10-CM

## 2023-01-14 DIAGNOSIS — Z96651 Presence of right artificial knee joint: Secondary | ICD-10-CM | POA: Diagnosis not present

## 2023-01-14 DIAGNOSIS — G8929 Other chronic pain: Secondary | ICD-10-CM | POA: Diagnosis not present

## 2023-01-14 DIAGNOSIS — F411 Generalized anxiety disorder: Secondary | ICD-10-CM

## 2023-01-14 MED ORDER — PREDNISONE 10 MG PO TABS
ORAL_TABLET | ORAL | 0 refills | Status: DC
Start: 2023-01-14 — End: 2023-01-19

## 2023-01-14 NOTE — Progress Notes (Signed)
Location:   [facility] Room Number: 1-A Place of Service:  SNF [phone]) Provider:  Lazarus Gowda, NP  PCP: Mahlon Gammon, MD  Patient Care Team: Mahlon Gammon, MD as PCP - General (Internal Medicine) Frederica Kuster, MD as Attending Physician [facility] Medicine)  Extended Emergency Contact Information Primary Emergency Contact: [facility] Address: [phone]          [place] [address] [place] Home Phone: [phone] Mobile Phone: [phone] Relation: Son Secondary Emergency Contact: [facility] Address: [address]          [place], [place] [address] [place] Home Phone: [phone] Relation: Son  Code Status:  DNR Goals of care: Advanced Directive information    01/14/2023    9:37 AM  Advanced Directives  Does Patient Have a Medical Advance Directive? Yes  Type of Estate agent of [place];Living will;Out of facility DNR (pink MOST or yellow form)  Does patient want to make changes to medical advance directive? No - Patient declined  Copy of Healthcare Power of Attorney in Chart? Yes - validated most recent copy scanned in chart (See row information)     Chief Complaint  Patient presents with   Acute Visit    Skin tear    HPI:  Pt is a 89 y.o. female seen today for an acute visit for ongoing right knee pain.   03/25 she slid out of PWC and injured lower extremities. She was initially placed on Tramadol prn, but medication did not relieve pain. She has been on Hydrocodone 5/325 mg- 1/2 tablets BID prn. She reports some relief with medication.Today, bilateral ankle pain has improved. Right knee continues to be painful with transfers and when sitting in PWC. H/o right knee arthroplasty that eventually left her non ambulatory. She also has right foot drop. No recent falls. Afebrile. Vitals stable.   She has been eating less since fall 03/25. She was started on Boost supplement shakes but  she is not drinking them. Nursing reports she is eating breakfast well, skipping lunch and eating 25-50% dinner. No recent weight due to broken scale.  04/11 she was started on ativan prn for anxiety. She reports improved symptoms. Denies recent panic attacks.        Past Medical History:  Diagnosis Date   Advanced care planning/counseling discussion 11/18/2018   Anemia    Closed compression fracture of L5 lumbar vertebra, sequela 09/08/2019   Hypertension    OA (osteoarthritis)    Obesity (BMI 30-39.9) 01/20/2018   Osteoporosis    Renal cyst    UTI (urinary tract infection) 11/28/2020   11/28/20 urine culture, Citrobacter, Keflex  tid x 7 day.    Weight gain 11/25/2018   Past Surgical History:  Procedure Laterality Date   APPENDECTOMY     BLADDER SUSPENSION     CATARACT EXTRACTION     CESAREAN SECTION     REPLACEMENT TOTAL KNEE  1995   TONSILLECTOMY      No Known Allergies  Allergies as of 01/14/2023   No Known Allergies      Medication List        Accurate as of January 14, 2023  9:37 AM. If you have any questions, ask your nurse or doctor.          STOP taking these medications    aspirin EC 81 MG tablet Stopped by: Octavia Heir, NP   HYDROcodone-acetaminophen 5-325 MG tablet Commonly known  as: Norco Stopped by: Octavia Heir, NP       TAKE these medications    acetaminophen 325 MG tablet Commonly known as: TYLENOL Take 650 mg by mouth every 4 (four) hours as needed.   acetaminophen 325 MG tablet Commonly known as: TYLENOL Take 650 mg by mouth 3 (three) times daily.   Biotin 5000 MCG Tabs Take 5,000 mcg by mouth daily.   bisacodyl 10 MG suppository Commonly known as: DULCOLAX Place 10 mg rectally every other day. As needed   GenTeal Tears Night-Time Oint Apply to eye as directed. Instill 0.25 inch in both eyes at bedtime for Dry eyes to conjunctival sac   LORazepam 0.5 MG tablet Commonly known as: Ativan Take 1 tablet (0.5 mg total)  by mouth 2 (two) times daily as needed for anxiety.   magnesium hydroxide 400 MG/5ML suspension Commonly known as: MILK OF MAGNESIA Take 30 mLs by mouth daily as needed for mild constipation.   metolazone 2.5 MG tablet Commonly known as: ZAROXOLYN Take 2.5 mg by mouth 2 (two) times a week. On Wed. And Sat. only   metoprolol tartrate 50 MG tablet Commonly known as: LOPRESSOR TAKE 1 TABLET BY MOUTH TWICE DAILY.   multivitamin-lutein Caps capsule Take 1 capsule by mouth 2 (two) times daily.   OcuSoft Eyelid Cleansing Pads Apply 1 Pad topically every morning.   phenazopyridine 100 MG tablet Commonly known as: PYRIDIUM Take 100 mg by mouth daily as needed for pain. Give one by mouth every 24 hours as needed for painful dysuria   polyethylene glycol 17 g packet Commonly known as: MIRALAX / GLYCOLAX Take 17 g by mouth daily.   polyvinyl alcohol 1.4 % ophthalmic solution Commonly known as: LIQUIFILM TEARS Place 1 drop into both eyes 3 (three) times daily.   Potassium Chloride ER 20 MEQ Tbcr Take 1 tablet by mouth 2 (two) times a week. On Wed. And Sat.   rOPINIRole 1 MG tablet Commonly known as: REQUIP TAKE 1 TABLET EACH EVENING.   sennosides-docusate sodium 8.6-50 MG tablet Commonly known as: SENOKOT-S Take 2 tablets by mouth daily. as needed for constipation at bedtime.   Vaseline ointment Generic drug: white petrolatum Apply 1 Application topically at bedtime. Apply to vaginal folds topically for dryness.   Vitamin D 50 MCG (2000 UT) Caps Take 2,000 Units by mouth daily.   zinc oxide 20 % ointment Apply 1 application topically as needed for irritation. Special Instructions: To buttocks after every incontinent episode and as needed for redness. May keep at bedside.        Review of Systems  Constitutional:  Positive for appetite change and fatigue.  HENT:  Negative for congestion and trouble swallowing.   Eyes:  Negative for visual disturbance.  Respiratory:   Negative for cough, shortness of breath and wheezing.   Cardiovascular:  Positive for leg swelling. Negative for chest pain.  Gastrointestinal:  Positive for constipation. Negative for abdominal pain, diarrhea, nausea and vomiting.  Genitourinary:  Negative for dysuria and frequency.  Musculoskeletal:  Positive for arthralgias, gait problem and joint swelling.  Skin:  Negative for wound.  Neurological:  Positive for weakness. Negative for dizziness and headaches.  Psychiatric/Behavioral:  Positive for confusion and dysphoric mood. Negative for sleep disturbance. The patient is nervous/anxious.     Immunization History  Administered Date(s) Administered   Covid-19,MRNA Vaccine(Spikevax)38mos. thru 58yrs 08/07/2022   Fluad Quad(high Dose 65+) 07/25/2022   Influenza, High Dose Seasonal PF 07/10/2017, 07/15/2019, 07/05/2020, 07/25/2021  Influenza,inj,Quad PF,6+ Mos 07/03/2018   Influenza-Unspecified 07/04/2015, 07/10/2017   Moderna SARS-COV2 Booster Vaccination 03/08/2021, 03/14/2022   Moderna Sars-Covid-2 Vaccination 10/05/2019, 11/02/2019, 08/15/2020, 06/21/2021   PFIZER(Purple Top)SARS-COV-2 Vaccination 06/21/2021   Pneumococcal Conjugate-13 08/18/2014   Pneumococcal Polysaccharide-23 07/26/2010   Tdap 11/12/2018   Pertinent  Health Maintenance Due  Topic Date Due   INFLUENZA VACCINE  05/02/2023   DEXA SCAN  Discontinued      08/12/2019    2:51 PM 09/29/2021    1:43 PM 10/12/2022    9:50 AM 10/12/2022    2:29 PM 10/26/2022    9:07 AM  Fall Risk  Falls in the past year? 0 1 0 0 0  Was there an injury with Fall?  0 0 0 0  Fall Risk Category Calculator  1 0 0 0  Fall Risk Category (Retired)  Low Low Low   (RETIRED) Patient Fall Risk Level   Low fall risk High fall risk   Patient at Risk for Falls Due to  History of fall(s);Impaired balance/gait;Orthopedic patient No Fall Risks History of fall(s);Impaired balance/gait;Impaired mobility History of fall(s);Impaired  balance/gait;Impaired mobility  Fall risk Follow up  Falls evaluation completed;Education provided;Falls prevention discussed Falls evaluation completed Falls evaluation completed;Education provided;Falls prevention discussed Falls evaluation completed   Functional Status Survey:    Vitals:   01/14/23 0931  BP: 98/71  Pulse: 70  Resp: 18  Temp: (!) 97.3 F (36.3 C)  SpO2: 97%  Weight: 120 lb 8 oz (54.7 kg)  Height: 5' (1.524 m)   Body mass index is 23.53 kg/m. Physical Exam Vitals reviewed.  Constitutional:      General: She is not in acute distress. HENT:     Head: Normocephalic.  Eyes:     General:        Right eye: No discharge.        Left eye: No discharge.  Cardiovascular:     Rate and Rhythm: Normal rate and regular rhythm.     Pulses: Normal pulses.     Heart sounds: Normal heart sounds.  Pulmonary:     Effort: Pulmonary effort is normal. No respiratory distress.     Breath sounds: Normal breath sounds. No wheezing.  Abdominal:     General: Bowel sounds are normal. There is no distension.     Palpations: Abdomen is soft.     Tenderness: There is no abdominal tenderness.  Musculoskeletal:     Cervical back: Neck supple.     Right knee: Swelling and deformity present. No erythema. Decreased range of motion. Tenderness present over the MCL.     Left knee: No swelling, deformity or erythema. Normal range of motion. No tenderness.     Right lower leg: Edema present.     Left lower leg: Edema present.  Skin:    General: Skin is warm.     Capillary Refill: Capillary refill takes less than 2 seconds.     Comments: Bruising to BLE improved, no skin breakdown. Skin tear to right forearm CDI, steri strips in place.   Neurological:     General: No focal deficit present.     Mental Status: She is alert. Mental status is at baseline.     Motor: Weakness present.     Gait: Gait abnormal.     Comments: hoyer  Psychiatric:        Mood and Affect: Mood normal.         Behavior: Behavior normal.     Labs reviewed: Recent  Labs    03/05/22 0000 03/15/22 0000 10/04/22 0940  NA 137 136* 139  K 3.5 3.7 3.6  CL 98* 97* 101  CO2 32* 34* 30*  BUN 32* 32* 31*  CREATININE 0.9 1.0 1.0  CALCIUM 9.2 9.6 9.1   Recent Labs    10/04/22 0940  AST 15  ALT 9  ALKPHOS 55  ALBUMIN 3.4*   Recent Labs    10/04/22 0940  WBC 7.3  NEUTROABS 4,256.00  HGB 12.1  HCT 36  PLT 199   Lab Results  Component Value Date   TSH 2.01 11/02/2021   No results found for: "HGBA1C" Lab Results  Component Value Date   CHOL 175 07/23/2019   HDL 54 07/23/2019   LDLCALC 105 (H) 07/23/2019   TRIG 74 07/23/2019   CHOLHDL 3.2 07/23/2019    Significant Diagnostic Results in last 30 days:  No results found.  Assessment/Plan 1. Chronic knee pain after total replacement of right knee joint - 03/25 slid out of PWC - h/o right knee arthroplasty/ right foot drop - non ambulatory for years - increased pain since recent fall - tramadol unsuccessful in relieving pain - doing well with norco> plan to continue BID PRN - start prednisone to help with increased right knee pain/ stimulate appetite  2. Decreased appetite - ongoing - refusing Boost - no recent weight> plan to weight patient this week> discussed with staff - see above  3. Generalized anxiety disorder - improved anxiety with Ativan prn - no recent panic attacks  4. Skin tear of forearm without complication, right, initial encounter - injury noted 04/13 - no sign of infection - cont steri strips and foam dressing    Family/ staff Communication: plan discussed with patient and nurse  Labs/tests ordered: weight patient

## 2023-01-16 ENCOUNTER — Encounter: Payer: Self-pay | Admitting: Orthopedic Surgery

## 2023-01-16 ENCOUNTER — Non-Acute Institutional Stay (SKILLED_NURSING_FACILITY): Payer: PPO | Admitting: Orthopedic Surgery

## 2023-01-16 DIAGNOSIS — Z96651 Presence of right artificial knee joint: Secondary | ICD-10-CM

## 2023-01-16 DIAGNOSIS — M25561 Pain in right knee: Secondary | ICD-10-CM

## 2023-01-16 DIAGNOSIS — M6281 Muscle weakness (generalized): Secondary | ICD-10-CM | POA: Diagnosis not present

## 2023-01-16 DIAGNOSIS — G8929 Other chronic pain: Secondary | ICD-10-CM | POA: Diagnosis not present

## 2023-01-16 DIAGNOSIS — M7989 Other specified soft tissue disorders: Secondary | ICD-10-CM

## 2023-01-16 DIAGNOSIS — R630 Anorexia: Secondary | ICD-10-CM | POA: Diagnosis not present

## 2023-01-16 DIAGNOSIS — Z9181 History of falling: Secondary | ICD-10-CM | POA: Diagnosis not present

## 2023-01-16 DIAGNOSIS — R278 Other lack of coordination: Secondary | ICD-10-CM | POA: Diagnosis not present

## 2023-01-16 MED ORDER — HYDROCODONE-ACETAMINOPHEN 5-325 MG PO TABS
0.5000 | ORAL_TABLET | Freq: Three times a day (TID) | ORAL | 0 refills | Status: DC | PRN
Start: 2023-01-16 — End: 2023-03-19

## 2023-01-16 NOTE — Progress Notes (Signed)
Location:  [facility] Room Number: 1/A Place of Service:  SNF [phone]) Provider:  Octavia Heir, NP   Mahlon Gammon, MD  Patient Care Team: Mahlon Gammon, MD as PCP - General (Internal Medicine) Frederica Kuster, MD as Attending Physician [facility])  Extended Emergency Contact Information Primary Emergency Contact: [facility] Address: [phone]          [place] [address] [place] of [place] Home Phone: [phone] Mobile Phone: [phone] Relation: Son Secondary Emergency Contact: [facility] Address: [address]          [place], [place] [address] [place] of [place] Home Phone: [phone] Relation: Son  Code Status:  DNR Goals of care: Advanced Directive information    01/14/2023    9:37 AM  Advanced Directives  Does Patient Have a Medical Advance Directive? Yes  Type of Estate agent of [place];Living will;Out of facility DNR (pink MOST or yellow form)  Does patient want to make changes to medical advance directive? No - Patient declined  Copy of Healthcare Power of Attorney in Chart? Yes - validated most recent copy scanned in chart (See row information)     Chief Complaint  Patient presents with   Acute Visit    Right leg swelling    HPI:  Pt is a 89 y.o. female seen today for acute visit due to ongoing right leg swelling.   "03/25 she slid out of PWC and injured lower extremities. She was initially placed on Tramadol prn, but medication did not relieve pain. She has been on Hydrocodone 5/325 mg- 1/2 tablets BID prn. She reports some relief with medication.Today, bilateral ankle pain has improved. Right knee continues to be painful with transfers and when sitting in PWC. H/o right knee arthroplasty that eventually left her non ambulatory. She also has right foot drop. No recent falls. Afebrile. Vitals stable."  04/15 she was placed on prednisone taper for ongoing right knee pain and mild  swelling. Today, RLE is swollen, warm to touch and painful at rest. She reports constant pain from right knee to toes. Since, accident she has been bed bound more due to pain. X rays were refused by family. Jonne Ply was held x 3 days due to hematomas.    Nursing reports improved appetite.   Past Medical History:  Diagnosis Date   Advanced care planning/counseling discussion 11/18/2018   Anemia    Closed compression fracture of L5 lumbar vertebra, sequela 09/08/2019   Hypertension    OA (osteoarthritis)    Obesity (BMI 30-39.9) 01/20/2018   Osteoporosis    Renal cyst    UTI (urinary tract infection) 11/28/2020   11/28/20 urine culture, Citrobacter, Keflex  tid x 7 day.    Weight gain 11/25/2018   Past Surgical History:  Procedure Laterality Date   APPENDECTOMY     BLADDER SUSPENSION     CATARACT EXTRACTION     CESAREAN SECTION     REPLACEMENT TOTAL KNEE  1995   TONSILLECTOMY      No Known Allergies  Outpatient Encounter Medications as of 01/16/2023  Medication Sig   acetaminophen (TYLENOL) 325 MG tablet Take 650 mg by mouth every 4 (four) hours as needed.   acetaminophen (TYLENOL) 325 MG tablet Take 650 mg by mouth 3 (three) times daily.   Biotin 5000 MCG TABS Take 5,000 mcg by mouth daily.    bisacodyl (DULCOLAX) 10 MG suppository Place 10 mg rectally every other day. As needed  Cholecalciferol (VITAMIN D) 50 MCG (2000 UT) CAPS Take 2,000 Units by mouth daily.    Eyelid Cleansers (OCUSOFT EYELID CLEANSING) PADS Apply 1 Pad topically every morning.   LORazepam (ATIVAN) 0.5 MG tablet Take 1 tablet (0.5 mg total) by mouth 2 (two) times daily as needed for anxiety.   magnesium hydroxide (MILK OF MAGNESIA) 400 MG/5ML suspension Take 30 mLs by mouth daily as needed for mild constipation.   metolazone (ZAROXOLYN) 2.5 MG tablet Take 2.5 mg by mouth 2 (two) times a week. On Wed. And Sat. only   metoprolol tartrate (LOPRESSOR) 50 MG tablet TAKE 1 TABLET BY MOUTH TWICE DAILY.    multivitamin-lutein (OCUVITE-LUTEIN) CAPS capsule Take 1 capsule by mouth 2 (two) times daily.    phenazopyridine (PYRIDIUM) 100 MG tablet Take 100 mg by mouth daily as needed for pain. Give one by mouth every 24 hours as needed for painful dysuria   polyethylene glycol (MIRALAX / GLYCOLAX) 17 g packet Take 17 g by mouth daily.   polyvinyl alcohol (LIQUIFILM TEARS) 1.4 % ophthalmic solution Place 1 drop into both eyes 3 (three) times daily.   Potassium Chloride ER 20 MEQ TBCR Take 1 tablet by mouth 2 (two) times a week. On Wed. And Sat.   predniSONE (DELTASONE) 10 MG tablet Take 4 tablets (40 mg total) by mouth daily with breakfast for 2 days, THEN 2 tablets (20 mg total) daily with breakfast for 5 days.   rOPINIRole (REQUIP) 1 MG tablet TAKE 1 TABLET EACH EVENING.   sennosides-docusate sodium (SENOKOT-S) 8.6-50 MG tablet Take 2 tablets by mouth daily. as needed for constipation at bedtime.   white petrolatum (VASELINE) ointment Apply 1 Application topically at bedtime. Apply to vaginal folds topically for dryness.   White Petrolatum-Mineral Oil (GENTEAL TEARS NIGHT-TIME) OINT Apply to eye as directed. Instill 0.25 inch in both eyes at bedtime for Dry eyes to conjunctival sac   zinc oxide 20 % ointment Apply 1 application topically as needed for irritation. Special Instructions: To buttocks after every incontinent episode and as needed for redness. May keep at bedside.   No facility-administered encounter medications on file as of 01/16/2023.    Review of Systems  Constitutional:  Positive for activity change and appetite change.  Respiratory:  Negative for cough, shortness of breath and wheezing.   Cardiovascular:  Positive for leg swelling. Negative for chest pain.  Gastrointestinal:  Negative for abdominal distention and abdominal pain.  Genitourinary:  Positive for dysuria and frequency.  Musculoskeletal:  Positive for gait problem and joint swelling.  Skin:  Positive for wound.   Neurological:  Positive for weakness. Negative for dizziness and headaches.  Psychiatric/Behavioral:  Positive for confusion and dysphoric mood. Negative for sleep disturbance. The patient is nervous/anxious.     Immunization History  Administered Date(s) Administered   Covid-19,MRNA Vaccine(Spikevax)40mos. thru 22yrs 08/07/2022   Fluad Quad(high Dose 65+) 07/25/2022   Influenza, High Dose Seasonal PF 07/10/2017, 07/15/2019, 07/05/2020, 07/25/2021   Influenza,inj,Quad PF,6+ Mos 07/03/2018   Influenza-Unspecified 07/04/2015, 07/10/2017   Moderna SARS-COV2 Booster Vaccination 03/08/2021, 03/14/2022   Moderna Sars-Covid-2 Vaccination 10/05/2019, 11/02/2019, 08/15/2020, 06/21/2021   PFIZER(Purple Top)SARS-COV-2 Vaccination 06/21/2021   Pneumococcal Conjugate-13 08/18/2014   Pneumococcal Polysaccharide-23 07/26/2010   Tdap 11/12/2018   Pertinent  Health Maintenance Due  Topic Date Due   INFLUENZA VACCINE  05/02/2023   DEXA SCAN  Discontinued      08/12/2019    2:51 PM 09/29/2021    1:43 PM 10/12/2022    9:50 AM  10/12/2022    2:29 PM 10/26/2022    9:07 AM  Fall Risk  Falls in the past year? 0 1 0 0 0  Was there an injury with Fall?  0 0 0 0  Fall Risk Category Calculator  1 0 0 0  Fall Risk Category (Retired)  Low Low Low   (RETIRED) Patient Fall Risk Level   Low fall risk High fall risk   Patient at Risk for Falls Due to  History of fall(s);Impaired balance/gait;Orthopedic patient No Fall Risks History of fall(s);Impaired balance/gait;Impaired mobility History of fall(s);Impaired balance/gait;Impaired mobility  Fall risk Follow up  Falls evaluation completed;Education provided;Falls prevention discussed Falls evaluation completed Falls evaluation completed;Education provided;Falls prevention discussed Falls evaluation completed   Functional Status Survey:    Vitals:   01/16/23 1103  BP: 113/73  Pulse: 67  Resp: 20  Temp: (!) 96.5 F (35.8 C)  SpO2: 95%  Weight: 120 lb 8 oz  (54.7 kg)  Height: 5' (1.524 m)   Body mass index is 23.53 kg/m. Physical Exam Vitals reviewed.  Constitutional:      General: She is not in acute distress. HENT:     Head: Normocephalic.  Eyes:     General:        Right eye: No discharge.        Left eye: No discharge.  Cardiovascular:     Rate and Rhythm: Normal rate and regular rhythm.     Pulses: Normal pulses.     Heart sounds: Normal heart sounds.  Pulmonary:     Effort: Pulmonary effort is normal.     Breath sounds: Normal breath sounds.  Abdominal:     General: Bowel sounds are normal. There is no distension.     Palpations: Abdomen is soft.     Tenderness: There is no abdominal tenderness.  Musculoskeletal:     Cervical back: Neck supple.     Right lower leg: Deformity and tenderness present. Edema present.     Left lower leg: Edema present.     Comments: Right knee with increased swelling to patella, right knee and RLE warm to touch, increased overall swelling to RLE, unable to perform homans test, right foot drop present  Skin:    General: Skin is warm.     Capillary Refill: Capillary refill takes less than 2 seconds.  Neurological:     General: No focal deficit present.     Mental Status: She is alert and oriented to person, place, and time.     Motor: Weakness present.     Gait: Gait abnormal.     Comments: hoyer  Psychiatric:        Mood and Affect: Mood normal.     Labs reviewed: Recent Labs    03/05/22 0000 03/15/22 0000 10/04/22 0940  NA 137 136* 139  K 3.5 3.7 3.6  CL 98* 97* 101  CO2 32* 34* 30*  BUN 32* 32* 31*  CREATININE 0.9 1.0 1.0  CALCIUM 9.2 9.6 9.1   Recent Labs    10/04/22 0940  AST 15  ALT 9  ALKPHOS 55  ALBUMIN 3.4*   Recent Labs    10/04/22 0940  WBC 7.3  NEUTROABS 4,256.00  HGB 12.1  HCT 36  PLT 199   Lab Results  Component Value Date   TSH 2.01 11/02/2021   No results found for: "HGBA1C" Lab Results  Component Value Date   CHOL 175 07/23/2019   HDL 54  07/23/2019   LDLCALC  105 (H) 07/23/2019   TRIG 74 07/23/2019   CHOLHDL 3.2 07/23/2019    Significant Diagnostic Results in last 30 days:  No results found.  Assessment/Plan 1. Right leg swelling - 04/15 started on prednisone taper - today> worsened swelling/ pain at rest and extremity warm to touch - concerned for DVT due to recent fall (03/25)/ decreased mobility/unale to r/o fracture - stat doppler to r/o DVT - h/o right knee arthroplasty - cbc/diff 04/18 t r/o infection  2. Chronic knee pain after total replacement of right knee joint - see above - cont scheduled tylenol - cont noro prn for increased pain - HYDROcodone-acetaminophen (NORCO/VICODIN) 5-325 MG tablet; Take 0.5 tablets by mouth 3 (three) times daily as needed for moderate pain.  Dispense: 30 tablet; Refill: 0 - OT consulted to evaluate PWC> they will wait until DVT ruled out  3. Decreased appetite - no recent weight - nursing reports she is eating more    Family/ staff Communication: plan discussed with patient and nurse  Labs/tests ordered:  stat doppler, cbc/diff

## 2023-01-17 DIAGNOSIS — G8929 Other chronic pain: Secondary | ICD-10-CM | POA: Diagnosis not present

## 2023-01-17 DIAGNOSIS — M25561 Pain in right knee: Secondary | ICD-10-CM | POA: Diagnosis not present

## 2023-01-17 DIAGNOSIS — Z96651 Presence of right artificial knee joint: Secondary | ICD-10-CM | POA: Diagnosis not present

## 2023-01-17 DIAGNOSIS — M7121 Synovial cyst of popliteal space [Baker], right knee: Secondary | ICD-10-CM | POA: Diagnosis not present

## 2023-01-17 DIAGNOSIS — R2241 Localized swelling, mass and lump, right lower limb: Secondary | ICD-10-CM | POA: Diagnosis not present

## 2023-01-17 LAB — CBC AND DIFFERENTIAL
HCT: 34 — AB (ref 36–46)
Hemoglobin: 11.2 — AB (ref 12.0–16.0)
Neutrophils Absolute: 3767
Platelets: 346 10*3/uL (ref 150–400)
WBC: 7.8

## 2023-01-17 LAB — CBC: RBC: 3.07 — AB (ref 3.87–5.11)

## 2023-01-18 ENCOUNTER — Encounter: Payer: Self-pay | Admitting: Internal Medicine

## 2023-01-18 ENCOUNTER — Non-Acute Institutional Stay (SKILLED_NURSING_FACILITY): Payer: PPO | Admitting: Internal Medicine

## 2023-01-18 DIAGNOSIS — F411 Generalized anxiety disorder: Secondary | ICD-10-CM

## 2023-01-18 DIAGNOSIS — R630 Anorexia: Secondary | ICD-10-CM

## 2023-01-18 DIAGNOSIS — G8929 Other chronic pain: Secondary | ICD-10-CM

## 2023-01-18 DIAGNOSIS — M6281 Muscle weakness (generalized): Secondary | ICD-10-CM | POA: Diagnosis not present

## 2023-01-18 DIAGNOSIS — M25561 Pain in right knee: Secondary | ICD-10-CM

## 2023-01-18 DIAGNOSIS — M7989 Other specified soft tissue disorders: Secondary | ICD-10-CM

## 2023-01-18 DIAGNOSIS — Z9181 History of falling: Secondary | ICD-10-CM | POA: Diagnosis not present

## 2023-01-18 DIAGNOSIS — R278 Other lack of coordination: Secondary | ICD-10-CM | POA: Diagnosis not present

## 2023-01-18 DIAGNOSIS — Z96651 Presence of right artificial knee joint: Secondary | ICD-10-CM | POA: Diagnosis not present

## 2023-01-18 NOTE — Progress Notes (Unsigned)
Location:  [facility] Room Number: 1A Place of Service:  SNF [phone]) Provider:  Mahlon Gammon, MD   Mahlon Gammon, MD  Patient Care Team: Mahlon Gammon, MD as PCP - General (Internal Medicine) Frederica Kuster, MD as Attending Physician [facility])  Extended Emergency Contact Information Primary Emergency Contact: [facility] Address: [phone]          [place] [address] [place] of [place] Home Phone: [phone] Mobile Phone: [phone] Relation: Son Secondary Emergency Contact: [facility] Address: [address]          [place], [place] [address] [place] Home Phone: [phone] Relation: Son  Code Status:  DNR Goals of care: Advanced Directive information    01/18/2023    2:04 PM  Advanced Directives  Does Patient Have a Medical Advance Directive? Yes  Type of Estate agent of [place];Living will;Out of facility DNR (pink MOST or yellow form)  Does patient want to make changes to medical advance directive? No - Patient declined  Copy of Healthcare Power of Attorney in Chart? Yes - validated most recent copy scanned in chart (See row information)     Chief Complaint  Patient presents with   Acute Visit    Patient is being seen for leg pain    HPI:  Pt is a 89 y.o. female seen today for an acute visit for Right Leg swelling and Pain  Lives in SNF    Patient has h/o Hypertension, Anxiety, Restless leg Syndrome, Insomnia, Arthritis and Posterior Tendon Dysfunction with right foot Valgus in Right Knee with inability to Ambulate  Patient said out of her power wheelchair on 03/25 Since then having pain in her right knee specially when they do her transfers.  Patient does have a history of right knee arthroplasty has been nonambulatory since then. Initially we had tried tramadol but that made her more lethargic.  Now she is on Norco But appetite stays poor Amy had tried low-dose  of prednisone to see if it will help her pain    But then she noticed that her right leg was swollen.   A Doppler was ordered which was negative for any DVT but now shows Baker's cyst Patient was sitting in her wheelchair today with her right leg elevated.  Did not complain of any pain in that leg.  She is happy with Ativan for her anxiety Also PRN tylenol and Norco if needed    Past Medical History:  Diagnosis Date   Advanced care planning/counseling discussion 11/18/2018   Anemia    Closed compression fracture of L5 lumbar vertebra, sequela 09/08/2019   Hypertension    OA (osteoarthritis)    Obesity (BMI 30-39.9) 01/20/2018   Osteoporosis    Renal cyst    UTI (urinary tract infection) 11/28/2020   11/28/20 urine culture, Citrobacter, Keflex  tid x 7 day.    Weight gain 11/25/2018   Past Surgical History:  Procedure Laterality Date   APPENDECTOMY     BLADDER SUSPENSION     CATARACT EXTRACTION     CESAREAN SECTION     REPLACEMENT TOTAL KNEE  1995   TONSILLECTOMY      No Known Allergies  Outpatient Encounter Medications as of 01/18/2023  Medication Sig   acetaminophen (TYLENOL) 325 MG tablet Take 650 mg by mouth every 4 (four) hours as needed.   acetaminophen (TYLENOL) 325 MG tablet Take 650 mg by mouth 3 (three) times daily.  aspirin EC 81 MG tablet Take 81 mg by mouth 2 (two) times a week. Swallow whole.   Biotin 5000 MCG TABS Take 5,000 mcg by mouth daily.    bisacodyl (DULCOLAX) 10 MG suppository Place 10 mg rectally every other day. As needed   Cholecalciferol (VITAMIN D) 50 MCG (2000 UT) CAPS Take 2,000 Units by mouth daily.    Eyelid Cleansers (OCUSOFT EYELID CLEANSING) PADS Apply 1 Pad topically every morning.   HYDROcodone-acetaminophen (NORCO/VICODIN) 5-325 MG tablet Take 0.5 tablets by mouth 3 (three) times daily as needed for moderate pain.   LORazepam (ATIVAN) 0.5 MG tablet Take 1 tablet (0.5 mg total) by mouth 2 (two) times daily as needed for anxiety.    magnesium hydroxide (MILK OF MAGNESIA) 400 MG/5ML suspension Take 30 mLs by mouth daily as needed for mild constipation.   metolazone (ZAROXOLYN) 2.5 MG tablet Take 2.5 mg by mouth 2 (two) times a week. On Wed. And Sat. only   metoprolol tartrate (LOPRESSOR) 50 MG tablet TAKE 1 TABLET BY MOUTH TWICE DAILY.   multivitamin-lutein (OCUVITE-LUTEIN) CAPS capsule Take 1 capsule by mouth 2 (two) times daily.    phenazopyridine (PYRIDIUM) 100 MG tablet Take 100 mg by mouth daily as needed for pain. Give one by mouth every 24 hours as needed for painful dysuria   polyethylene glycol (MIRALAX / GLYCOLAX) 17 g packet Take 17 g by mouth daily.   polyvinyl alcohol (LIQUIFILM TEARS) 1.4 % ophthalmic solution Place 1 drop into both eyes 3 (three) times daily.   Potassium Chloride ER 20 MEQ TBCR Take 1 tablet by mouth 2 (two) times a week. On Wed. And Sat.   predniSONE (DELTASONE) 10 MG tablet Take 4 tablets (40 mg total) by mouth daily with breakfast for 2 days, THEN 2 tablets (20 mg total) daily with breakfast for 5 days.   rOPINIRole (REQUIP) 1 MG tablet TAKE 1 TABLET EACH EVENING.   sennosides-docusate sodium (SENOKOT-S) 8.6-50 MG tablet Take 2 tablets by mouth daily. as needed for constipation at bedtime.   white petrolatum (VASELINE) ointment Apply 1 Application topically at bedtime. Apply to vaginal folds topically for dryness.   White Petrolatum-Mineral Oil (GENTEAL TEARS NIGHT-TIME) OINT Apply to eye as directed. Instill 0.25 inch in both eyes at bedtime for Dry eyes to conjunctival sac   zinc oxide 20 % ointment Apply 1 application topically as needed for irritation. Special Instructions: To buttocks after every incontinent episode and as needed for redness. May keep at bedside.   No facility-administered encounter medications on file as of 01/18/2023.    Review of Systems  Constitutional:  Positive for activity change and appetite change.  HENT: Negative.    Respiratory:  Negative for cough and  shortness of breath.   Cardiovascular:  Positive for leg swelling.  Gastrointestinal:  Negative for constipation.  Genitourinary: Negative.   Musculoskeletal:  Positive for arthralgias and gait problem. Negative for myalgias.  Skin: Negative.   Neurological:  Negative for dizziness and weakness.  Psychiatric/Behavioral:  Negative for confusion, dysphoric mood and sleep disturbance. The patient is nervous/anxious.     Immunization History  Administered Date(s) Administered   Covid-19,MRNA Vaccine(Spikevax)34mos. thru 27yrs 08/07/2022   Fluad Quad(high Dose 65+) 07/25/2022   Influenza, High Dose Seasonal PF 07/10/2017, 07/15/2019, 07/05/2020, 07/25/2021   Influenza,inj,Quad PF,6+ Mos 07/03/2018   Influenza-Unspecified 07/04/2015, 07/10/2017   Moderna SARS-COV2 Booster Vaccination 03/08/2021, 03/14/2022   Moderna Sars-Covid-2 Vaccination 10/05/2019, 11/02/2019, 08/15/2020, 06/21/2021   PFIZER(Purple Top)SARS-COV-2 Vaccination 06/21/2021   Pneumococcal Conjugate-13  08/18/2014   Pneumococcal Polysaccharide-23 07/26/2010   Tdap 11/12/2018   Pertinent  Health Maintenance Due  Topic Date Due   INFLUENZA VACCINE  05/02/2023   DEXA SCAN  Discontinued      08/12/2019    2:51 PM 09/29/2021    1:43 PM 10/12/2022    9:50 AM 10/12/2022    2:29 PM 10/26/2022    9:07 AM  Fall Risk  Falls in the past year? 0 1 0 0 0  Was there an injury with Fall?  0 0 0 0  Fall Risk Category Calculator  1 0 0 0  Fall Risk Category (Retired)  Low Low Low   (RETIRED) Patient Fall Risk Level   Low fall risk High fall risk   Patient at Risk for Falls Due to  History of fall(s);Impaired balance/gait;Orthopedic patient No Fall Risks History of fall(s);Impaired balance/gait;Impaired mobility History of fall(s);Impaired balance/gait;Impaired mobility  Fall risk Follow up  Falls evaluation completed;Education provided;Falls prevention discussed Falls evaluation completed Falls evaluation completed;Education  provided;Falls prevention discussed Falls evaluation completed   Functional Status Survey:    Vitals:   01/18/23 1402  BP: 113/73  Pulse: 67  Resp: 20  Temp: (!) 96.5 F (35.8 C)  TempSrc: Temporal  SpO2: 95%  Weight: 120 lb 8 oz (54.7 kg)  Height: 5' (1.524 m)   Body mass index is 23.53 kg/m. Physical Exam Vitals reviewed.  Constitutional:      Appearance: Normal appearance.  HENT:     Head: Normocephalic.     Nose: Nose normal.     Mouth/Throat:     Mouth: Mucous membranes are moist.     Pharynx: Oropharynx is clear.  Eyes:     Pupils: Pupils are equal, round, and reactive to light.  Cardiovascular:     Rate and Rhythm: Normal rate and regular rhythm.     Pulses: Normal pulses.     Heart sounds: Normal heart sounds. No murmur heard. Pulmonary:     Effort: Pulmonary effort is normal.     Breath sounds: Normal breath sounds.  Abdominal:     General: Abdomen is flat. Bowel sounds are normal.     Palpations: Abdomen is soft.  Musculoskeletal:        General: No swelling.     Cervical back: Neck supple.     Comments: Right leg Looked at baseline I was able to Flex it and Move it with no Obvious Pain  Skin:    General: Skin is warm.  Neurological:     Mental Status: She is alert and oriented to person, place, and time.  Psychiatric:        Mood and Affect: Mood normal.        Thought Content: Thought content normal.     Labs reviewed: Recent Labs    03/05/22 0000 03/15/22 0000 10/04/22 0940  NA 137 136* 139  K 3.5 3.7 3.6  CL 98* 97* 101  CO2 32* 34* 30*  BUN 32* 32* 31*  CREATININE 0.9 1.0 1.0  CALCIUM 9.2 9.6 9.1   Recent Labs    10/04/22 0940  AST 15  ALT 9  ALKPHOS 55  ALBUMIN 3.4*   Recent Labs    10/04/22 0940 01/17/23 0000  WBC 7.3 7.8  NEUTROABS 4,256.00 3,767.00  HGB 12.1 11.2*  HCT 36 34*  PLT 199 346   Lab Results  Component Value Date   TSH 2.01 11/02/2021   No results found for: "HGBA1C" Lab Results  Component  Value Date   CHOL 175 07/23/2019   HDL 54 07/23/2019   LDLCALC 105 (H) 07/23/2019   TRIG 74 07/23/2019   CHOLHDL 3.2 07/23/2019    Significant Diagnostic Results in last 30 days:  No results found.  Assessment/Plan 1. Right leg swelling Dopplers Negative Seemed back to normal On Low Dose of Metolazone  2. Chronic knee pain after total replacement of right knee joint Now also has Bakers Cyst Not candidate for Aspiration It is not bothering her anymore Continue Norco PRN  3. Decreased appetite Uses Boost supplement Can consider Remeron Has refused before  4. Generalized anxiety disorder Low dose of Ativan  Discussed with Dr Hyacinth Meeker her POA    Family/ staff Communication:   Labs/tests ordered:

## 2023-01-21 DIAGNOSIS — R278 Other lack of coordination: Secondary | ICD-10-CM | POA: Diagnosis not present

## 2023-01-21 DIAGNOSIS — M6281 Muscle weakness (generalized): Secondary | ICD-10-CM | POA: Diagnosis not present

## 2023-01-21 DIAGNOSIS — Z9181 History of falling: Secondary | ICD-10-CM | POA: Diagnosis not present

## 2023-01-22 DIAGNOSIS — M6281 Muscle weakness (generalized): Secondary | ICD-10-CM | POA: Diagnosis not present

## 2023-01-22 DIAGNOSIS — R278 Other lack of coordination: Secondary | ICD-10-CM | POA: Diagnosis not present

## 2023-01-22 DIAGNOSIS — Z9181 History of falling: Secondary | ICD-10-CM | POA: Diagnosis not present

## 2023-01-23 DIAGNOSIS — R278 Other lack of coordination: Secondary | ICD-10-CM | POA: Diagnosis not present

## 2023-01-23 DIAGNOSIS — M6281 Muscle weakness (generalized): Secondary | ICD-10-CM | POA: Diagnosis not present

## 2023-01-23 DIAGNOSIS — Z9181 History of falling: Secondary | ICD-10-CM | POA: Diagnosis not present

## 2023-01-24 ENCOUNTER — Encounter: Payer: Self-pay | Admitting: Internal Medicine

## 2023-01-24 ENCOUNTER — Non-Acute Institutional Stay (SKILLED_NURSING_FACILITY): Payer: PPO | Admitting: Internal Medicine

## 2023-01-24 DIAGNOSIS — R634 Abnormal weight loss: Secondary | ICD-10-CM | POA: Diagnosis not present

## 2023-01-24 DIAGNOSIS — R001 Bradycardia, unspecified: Secondary | ICD-10-CM | POA: Diagnosis not present

## 2023-01-24 DIAGNOSIS — R630 Anorexia: Secondary | ICD-10-CM

## 2023-01-24 DIAGNOSIS — F411 Generalized anxiety disorder: Secondary | ICD-10-CM

## 2023-01-24 DIAGNOSIS — M7989 Other specified soft tissue disorders: Secondary | ICD-10-CM | POA: Diagnosis not present

## 2023-01-24 NOTE — Progress Notes (Signed)
Location:  [facility] Room Number: 1A Place of Service:  SNF [phone]) Provider:  Mahlon Gammon, MD   Mahlon Gammon, MD  Patient Care Team: Mahlon Gammon, MD as PCP - General (Internal Medicine) Frederica Kuster, MD as Attending Physician [facility] Medicine)  Extended Emergency Contact Information Primary Emergency Contact: [facility] Address: [phone]          [place] [address] [place] Home Phone: [phone] Mobile Phone: [phone] Relation: Son Secondary Emergency Contact: [facility] Address: [address]          [place], [place] [address] [place] Home Phone: [phone] Relation: Son  Code Status:  DNR Goals of care: Advanced Directive information    01/24/2023    3:32 PM  Advanced Directives  Does Patient Have a Medical Advance Directive? Yes  Type of Estate agent of [place];Living will;Out of facility DNR (pink MOST or yellow form)  Does patient want to make changes to medical advance directive? No - Patient declined  Copy of Healthcare Power of Attorney in Chart? Yes - validated most recent copy scanned in chart (See row information)     Chief Complaint  Patient presents with   Acute Visit    Patient is being seen for weight loss    HPI:  Pt is a 89 y.o. female seen today for an acute visit for weight loss  Lives in SNF    Patient has h/o Hypertension, Anxiety, Restless leg Syndrome, Insomnia, Arthritis and Posterior Tendon Dysfunction with right foot Valgus in Right Knee with inability to Ambulate   Patient has been slowly losing weight recently Has very Poor Appetite Her POA Dr Hyacinth Meeker does not want anything Aggressive but do want to try Remeron Patient was on it before but she took herself off it  She was 123 lbs few months ago and now is 117 lbs  She is doing better overall Her pain is controlled with Norco low dose and Anxiety with Ativan Is having  some Constipation   Other issue Patient slid out of her power wheelchair on 03/25 Since then having pain in her right knee specially when they do her transfers.  Patient does have a history of right knee arthroplasty has been nonambulatory since then.  A Doppler was ordered which was negative for any DVT but shows Baker's cyst  Past Medical History:  Diagnosis Date   Advanced care planning/counseling discussion 11/18/2018   Anemia    Closed compression fracture of L5 lumbar vertebra, sequela 09/08/2019   Hypertension    OA (osteoarthritis)    Obesity (BMI 30-39.9) 01/20/2018   Osteoporosis    Renal cyst    UTI (urinary tract infection) 11/28/2020   11/28/20 urine culture, Citrobacter, Keflex 500mg  tid x 7 day.    Weight gain 11/25/2018   Past Surgical History:  Procedure Laterality Date   APPENDECTOMY     BLADDER SUSPENSION     CATARACT EXTRACTION     CESAREAN SECTION     REPLACEMENT TOTAL KNEE  1995   TONSILLECTOMY      No Known Allergies  Outpatient Encounter Medications as of 01/24/2023  Medication Sig   acetaminophen (TYLENOL) 325 MG tablet Take 650 mg by mouth every 4 (four) hours as needed.   acetaminophen (TYLENOL) 325 MG tablet Take 650 mg by mouth 3 (three) times daily.   aspirin EC 81 MG tablet Take 81 mg by mouth 2 (two) times a  week. Swallow whole.   Biotin 5000 MCG TABS Take 5,000 mcg by mouth daily.    bisacodyl (DULCOLAX) 10 MG suppository Place 10 mg rectally every other day. As needed   Cholecalciferol (VITAMIN D) 50 MCG (2000 UT) CAPS Take 2,000 Units by mouth daily.    Eyelid Cleansers (OCUSOFT EYELID CLEANSING) PADS Apply 1 Pad topically every morning.   HYDROcodone-acetaminophen (NORCO/VICODIN) 5-325 MG tablet Take 0.5 tablets by mouth 3 (three) times daily as needed for moderate pain.   LORazepam (ATIVAN) 0.5 MG tablet Take 1 tablet (0.5 mg total) by mouth 2 (two) times daily as needed for anxiety.   magnesium hydroxide (MILK OF MAGNESIA) 400 MG/5ML  suspension Take 30 mLs by mouth daily as needed for mild constipation.   metolazone (ZAROXOLYN) 2.5 MG tablet Take 2.5 mg by mouth 2 (two) times a week. On Wed. And Sat. only   metoprolol tartrate (LOPRESSOR) 50 MG tablet TAKE 1 TABLET BY MOUTH TWICE DAILY.   multivitamin-lutein (OCUVITE-LUTEIN) CAPS capsule Take 1 capsule by mouth 2 (two) times daily.    phenazopyridine (PYRIDIUM) 100 MG tablet Take 100 mg by mouth daily as needed for pain. Give one by mouth every 24 hours as needed for painful dysuria   polyethylene glycol (MIRALAX / GLYCOLAX) 17 g packet Take 17 g by mouth daily.   polyvinyl alcohol (LIQUIFILM TEARS) 1.4 % ophthalmic solution Place 1 drop into both eyes 3 (three) times daily.   Potassium Chloride ER 20 MEQ TBCR Take 1 tablet by mouth 2 (two) times a week. On Wed. And Sat.   rOPINIRole (REQUIP) 1 MG tablet TAKE 1 TABLET EACH EVENING.   sennosides-docusate sodium (SENOKOT-S) 8.6-50 MG tablet Take 2 tablets by mouth daily. as needed for constipation at bedtime.   white petrolatum (VASELINE) ointment Apply 1 Application topically at bedtime. Apply to vaginal folds topically for dryness.   White Petrolatum-Mineral Oil (GENTEAL TEARS NIGHT-TIME) OINT Apply to eye as directed. Instill 0.25 inch in both eyes at bedtime for Dry eyes to conjunctival sac   zinc oxide 20 % ointment Apply 1 application topically as needed for irritation. Special Instructions: To buttocks after every incontinent episode and as needed for redness. May keep at bedside.   No facility-administered encounter medications on file as of 01/24/2023.    Review of Systems  Constitutional:  Positive for activity change and appetite change.  HENT: Negative.    Respiratory:  Negative for cough and shortness of breath.   Cardiovascular:  Negative for leg swelling.  Gastrointestinal:  Positive for constipation.  Genitourinary: Negative.   Musculoskeletal:  Positive for arthralgias and gait problem. Negative for  myalgias.  Skin: Negative.   Neurological:  Negative for dizziness and weakness.  Psychiatric/Behavioral:  Negative for confusion, dysphoric mood and sleep disturbance. The patient is nervous/anxious.     Immunization History  Administered Date(s) Administered   Covid-19,MRNA Vaccine(Spikevax)75mos. thru 40yrs 08/07/2022   Fluad Quad(high Dose 65+) 07/25/2022   Influenza, High Dose Seasonal PF 07/10/2017, 07/15/2019, 07/05/2020, 07/25/2021   Influenza,inj,Quad PF,6+ Mos 07/03/2018   Influenza-Unspecified 07/04/2015, 07/10/2017   Moderna SARS-COV2 Booster Vaccination 03/08/2021, 03/14/2022   Moderna Sars-Covid-2 Vaccination 10/05/2019, 11/02/2019, 08/15/2020, 06/21/2021   PFIZER(Purple Top)SARS-COV-2 Vaccination 06/21/2021   Pneumococcal Conjugate-13 08/18/2014   Pneumococcal Polysaccharide-23 07/26/2010   Tdap 11/12/2018   Pertinent  Health Maintenance Due  Topic Date Due   INFLUENZA VACCINE  05/02/2023   DEXA SCAN  Discontinued      08/12/2019    2:51 PM 09/29/2021  1:43 PM 10/12/2022    9:50 AM 10/12/2022    2:29 PM 10/26/2022    9:07 AM  Fall Risk  Falls in the past year? 0 1 0 0 0  Was there an injury with Fall?  0 0 0 0  Fall Risk Category Calculator  1 0 0 0  Fall Risk Category (Retired)  Low Low Low   (RETIRED) Patient Fall Risk Level   Low fall risk High fall risk   Patient at Risk for Falls Due to  History of fall(s);Impaired balance/gait;Orthopedic patient No Fall Risks History of fall(s);Impaired balance/gait;Impaired mobility History of fall(s);Impaired balance/gait;Impaired mobility  Fall risk Follow up  Falls evaluation completed;Education provided;Falls prevention discussed Falls evaluation completed Falls evaluation completed;Education provided;Falls prevention discussed Falls evaluation completed   Functional Status Survey:    Vitals:   01/24/23 1529  BP: 134/83  Pulse: 61  Resp: 14  Temp: 97.6 F (36.4 C)  TempSrc: Temporal  SpO2: 93%  Weight: 117  lb 4.8 oz (53.2 kg)  Height: 5' (1.524 m)   Body mass index is 22.91 kg/m. Physical Exam Vitals reviewed.  Constitutional:      Appearance: Normal appearance.  HENT:     Head: Normocephalic.     Nose: Nose normal.     Mouth/Throat:     Mouth: Mucous membranes are moist.     Pharynx: Oropharynx is clear.  Eyes:     Pupils: Pupils are equal, round, and reactive to light.  Cardiovascular:     Rate and Rhythm: Bradycardia present. Rhythm irregular.     Pulses: Normal pulses.     Heart sounds: Murmur heard.  Pulmonary:     Effort: Pulmonary effort is normal.     Breath sounds: Normal breath sounds.  Abdominal:     General: Abdomen is flat. Bowel sounds are normal.     Palpations: Abdomen is soft.  Musculoskeletal:        General: Swelling present.     Cervical back: Neck supple.  Skin:    General: Skin is warm.  Neurological:     Mental Status: She is alert and oriented to person, place, and time.  Psychiatric:        Mood and Affect: Mood normal.        Thought Content: Thought content normal.     Labs reviewed: Recent Labs    03/05/22 0000 03/15/22 0000 10/04/22 0940  NA 137 136* 139  K 3.5 3.7 3.6  CL 98* 97* 101  CO2 32* 34* 30*  BUN 32* 32* 31*  CREATININE 0.9 1.0 1.0  CALCIUM 9.2 9.6 9.1   Recent Labs    10/04/22 0940  AST 15  ALT 9  ALKPHOS 55  ALBUMIN 3.4*   Recent Labs    10/04/22 0940 01/17/23 0000  WBC 7.3 7.8  NEUTROABS 4,256.00 3,767.00  HGB 12.1 11.2*  HCT 36 34*  PLT 199 346   Lab Results  Component Value Date   TSH 2.01 11/02/2021   No results found for: "HGBA1C" Lab Results  Component Value Date   CHOL 175 07/23/2019   HDL 54 07/23/2019   LDLCALC 105 (H) 07/23/2019   TRIG 74 07/23/2019   CHOLHDL 3.2 07/23/2019    Significant Diagnostic Results in last 30 days:  No results found.  Assessment/Plan 1. Weight loss Agreeable to try Remeron 7.5 mg QHS  2. Decreased appetite AS above  3. Right leg swelling Much  improved On Metolazone Pain due to bakers cyst  Norco is helping  4. Generalized anxiety disorder Ativan is helping  5. Bradycardia Her BP and HR also has been on Lower side here in facility Change Metoprolol in AM to 25 mg and continue PM at 50 mg    Family/ staff Communication:   Labs/tests ordered:

## 2023-01-28 ENCOUNTER — Non-Acute Institutional Stay (SKILLED_NURSING_FACILITY): Payer: PPO | Admitting: Orthopedic Surgery

## 2023-01-28 ENCOUNTER — Encounter: Payer: Self-pay | Admitting: Orthopedic Surgery

## 2023-01-28 DIAGNOSIS — F411 Generalized anxiety disorder: Secondary | ICD-10-CM

## 2023-01-28 DIAGNOSIS — R269 Unspecified abnormalities of gait and mobility: Secondary | ICD-10-CM

## 2023-01-28 DIAGNOSIS — G8929 Other chronic pain: Secondary | ICD-10-CM | POA: Diagnosis not present

## 2023-01-28 DIAGNOSIS — M25561 Pain in right knee: Secondary | ICD-10-CM

## 2023-01-28 DIAGNOSIS — N1831 Chronic kidney disease, stage 3a: Secondary | ICD-10-CM

## 2023-01-28 DIAGNOSIS — R309 Painful micturition, unspecified: Secondary | ICD-10-CM | POA: Diagnosis not present

## 2023-01-28 DIAGNOSIS — G2581 Restless legs syndrome: Secondary | ICD-10-CM

## 2023-01-28 DIAGNOSIS — Z96651 Presence of right artificial knee joint: Secondary | ICD-10-CM | POA: Diagnosis not present

## 2023-01-28 DIAGNOSIS — R634 Abnormal weight loss: Secondary | ICD-10-CM | POA: Diagnosis not present

## 2023-01-28 DIAGNOSIS — M6281 Muscle weakness (generalized): Secondary | ICD-10-CM | POA: Diagnosis not present

## 2023-01-28 DIAGNOSIS — R278 Other lack of coordination: Secondary | ICD-10-CM | POA: Diagnosis not present

## 2023-01-28 DIAGNOSIS — R600 Localized edema: Secondary | ICD-10-CM | POA: Diagnosis not present

## 2023-01-28 DIAGNOSIS — I10 Essential (primary) hypertension: Secondary | ICD-10-CM | POA: Diagnosis not present

## 2023-01-28 DIAGNOSIS — Z9181 History of falling: Secondary | ICD-10-CM | POA: Diagnosis not present

## 2023-01-28 MED ORDER — LORAZEPAM 0.5 MG PO TABS
0.5000 mg | ORAL_TABLET | Freq: Every day | ORAL | 0 refills | Status: AC | PRN
Start: 2023-01-28 — End: 2023-02-27

## 2023-01-28 NOTE — Progress Notes (Signed)
Location:   [facility] Room Number: 1-A Place of Service:  SNF [phone]) Provider:  Hazle Nordmann, NP  PCP: Mahlon Gammon, MD  Patient Care Team: Mahlon Gammon, MD as PCP - General (Internal Medicine) Frederica Kuster, MD as Attending Physician [facility] Medicine)  Extended Emergency Contact Information Primary Emergency Contact: [facility] Address: [phone]          [place] [address] [place] Home Phone: [phone] Mobile Phone: [phone] Relation: Son Secondary Emergency Contact: [facility] Address: [address]          [place], [place] [address] [place] Home Phone: [phone] Relation: Son  Code Status:  DNR Goals of care: Advanced Directive information    01/28/2023   10:22 AM  Advanced Directives  Does Patient Have a Medical Advance Directive? Yes  Type of Estate agent of [place];Living will;Out of facility DNR (pink MOST or yellow form)  Does patient want to make changes to medical advance directive? No - Patient declined  Copy of Healthcare Power of Attorney in Chart? Yes - validated most recent copy scanned in chart (See row information)     Chief Complaint  Patient presents with   Medical Management of Chronic Issues    Routine Visit.    Immunizations    Discuss the need for Shingrix vaccine.    HPI:  Pt is a 89 y.o. female seen today for medical management of chronic diseases.    She currently resides on the skilled nursing unit at [facility]. PMH: hypertension, PVD, osteoarthritis of multiple joints, CKD stage 3, posterior tendon dysfunction with right foot drop, constipation, restless leg syndrome and anxiety.     Anxiety- increased anxiety in evening, began after fall 03/25, denies recent panic, resolved with Ativan 0.5 mg prn Weight loss- ongoing, decreased appetite began after fall 03/25, eating has improved, 04/26 started on Remeron, see trends  below Right knee pain- 03/25 mechanical fall, family refused xrays, doppler negative for DVT> confirmed Bakers cyst, OT recommended Podon boot in [facility], on Noro prn for pain HTN-  BUN/creat 31/1.0 10/04/2022, remains on metoprolol CKD- GFR 53 (01/04)> 49 (03/2022) Painful micturition/ vaginal dryness- ongoing, unsuccessful trial of Uribel 06/2021 and Estrace (breast tenderness), past urine cultures unremarkable, refused urology consult, remains on pyridium prn, symptoms improved with good hydration BLE- R>L, given metolazone/potassium twice weekly, unsuccessful trial of leg wrappings  RLS- remains on requip qhs  Recent weights:  04/25- 117.3 lbs  03/03- 120.5 lbs  02/01- 123 lbs  12/2021- 124.1 lbs  Recent blood pressures:  04/26- 122/86, 112/74  04/23- 134/83  04/16- 113/73  Past Medical History:  Diagnosis Date   Advanced care planning/counseling discussion 11/18/2018   Anemia    Closed compression fracture of L5 lumbar vertebra, sequela 09/08/2019   Hypertension    OA (osteoarthritis)    Obesity (BMI 30-39.9) 01/20/2018   Osteoporosis    Renal cyst    UTI (urinary tract infection) 11/28/2020   11/28/20 urine culture, Citrobacter, Keflex 500mg  tid x 7 day.    Weight gain 11/25/2018   Past Surgical History:  Procedure Laterality Date   APPENDECTOMY     BLADDER SUSPENSION     CATARACT EXTRACTION     CESAREAN SECTION     REPLACEMENT TOTAL KNEE  1995   TONSILLECTOMY      No Known Allergies  Allergies as of 01/28/2023   No Known Allergies  Medication List        Accurate as of January 28, 2023 10:22 AM. If you have any questions, ask your nurse or doctor.          acetaminophen 325 MG tablet Commonly known as: TYLENOL Take 650 mg by mouth every 4 (four) hours as needed.   acetaminophen 325 MG tablet Commonly known as: TYLENOL Take 650 mg by mouth 3 (three) times daily.   aspirin EC 81 MG tablet Take 81 mg by mouth 2 (two) times a week. Swallow whole.    Biotin 5000 MCG Tabs Take 5,000 mcg by mouth daily.   bisacodyl 10 MG suppository Commonly known as: DULCOLAX Place 10 mg rectally every other day. As needed   GenTeal Tears Night-Time Oint Apply to eye as directed. Instill 0.25 inch in both eyes at bedtime for Dry eyes to conjunctival sac   HYDROcodone-acetaminophen 5-325 MG tablet Commonly known as: NORCO/VICODIN Take 0.5 tablets by mouth 3 (three) times daily as needed for moderate pain.   LORazepam 0.5 MG tablet Commonly known as: ATIVAN Take 0.5 mg by mouth daily. What changed: Another medication with the same name was removed. Continue taking this medication, and follow the directions you see here. Changed by: Octavia Heir, NP   metolazone 2.5 MG tablet Commonly known as: ZAROXOLYN Take 2.5 mg by mouth 2 (two) times a week. On Wed. And Sat. only   metoprolol tartrate 25 MG tablet Commonly known as: LOPRESSOR Take 25 mg by mouth every morning. What changed: Another medication with the same name was removed. Continue taking this medication, and follow the directions you see here. Changed by: Octavia Heir, NP   metoprolol tartrate 50 MG tablet Commonly known as: LOPRESSOR Take 50 mg by mouth every evening. What changed: Another medication with the same name was removed. Continue taking this medication, and follow the directions you see here. Changed by: Octavia Heir, NP   MILK OF MAGNESIA PO Take 30 mLs by mouth daily as needed (for constipation.).   mirtazapine 7.5 MG tablet Commonly known as: REMERON Take 7.5 mg by mouth at bedtime.   multivitamin-lutein Caps capsule Take 1 capsule by mouth 2 (two) times daily.   OcuSoft Eyelid Cleansing Pads Apply 1 Pad topically every morning.   phenazopyridine 100 MG tablet Commonly known as: PYRIDIUM Take 100 mg by mouth daily as needed for pain. Give one by mouth every 24 hours as needed for painful dysuria   polyethylene glycol 17 g packet Commonly known as: MIRALAX /  GLYCOLAX Take 17 g by mouth daily.   polyvinyl alcohol 1.4 % ophthalmic solution Commonly known as: LIQUIFILM TEARS Place 1 drop into both eyes 3 (three) times daily.   Potassium Chloride ER 20 MEQ Tbcr Take 1 tablet by mouth 2 (two) times a week. On Wed. And Sat.   rOPINIRole 1 MG tablet Commonly known as: REQUIP TAKE 1 TABLET EACH EVENING.   sennosides-docusate sodium 8.6-50 MG tablet Commonly known as: SENOKOT-S Take 2 tablets by mouth daily. as needed for constipation at bedtime.   Vaseline ointment Generic drug: white petrolatum Apply 1 Application topically at bedtime. Apply to vaginal folds topically for dryness.   Vitamin D 50 MCG (2000 UT) Caps Take 2,000 Units by mouth daily.   zinc oxide 20 % ointment Apply 1 application topically as needed for irritation. Special Instructions: To buttocks after every incontinent episode and as needed for redness. May keep at bedside.  Review of Systems  Constitutional:  Positive for appetite change. Negative for activity change.  HENT:  Positive for hearing loss. Negative for sinus pain.   Eyes:  Negative for visual disturbance.  Respiratory:  Negative for cough, shortness of breath and wheezing.   Cardiovascular:  Negative for chest pain and leg swelling.  Gastrointestinal:  Negative for abdominal distention and abdominal pain.  Genitourinary:  Negative for dysuria.  Musculoskeletal:  Positive for gait problem.  Skin:  Negative for wound.  Neurological:  Positive for weakness. Negative for dizziness and headaches.  Psychiatric/Behavioral:  Negative for confusion and dysphoric mood. The patient is not nervous/anxious.     Immunization History  Administered Date(s) Administered   Covid-19,MRNA Vaccine(Spikevax)11mos. thru 10yrs 08/07/2022   Fluad Quad(high Dose 65+) 07/25/2022   Influenza, High Dose Seasonal PF 07/10/2017, 07/15/2019, 07/05/2020, 07/25/2021   Influenza,inj,Quad PF,6+ Mos 07/03/2018    Influenza-Unspecified 07/04/2015, 07/10/2017   Moderna SARS-COV2 Booster Vaccination 03/08/2021, 03/14/2022   Moderna Sars-Covid-2 Vaccination 10/05/2019, 11/02/2019, 08/15/2020, 06/21/2021   PFIZER(Purple Top)SARS-COV-2 Vaccination 06/21/2021   Pneumococcal Conjugate-13 08/18/2014   Pneumococcal Polysaccharide-23 07/26/2010   Tdap 11/12/2018   Pertinent  Health Maintenance Due  Topic Date Due   INFLUENZA VACCINE  05/02/2023   DEXA SCAN  Discontinued      08/12/2019    2:51 PM 09/29/2021    1:43 PM 10/12/2022    9:50 AM 10/12/2022    2:29 PM 10/26/2022    9:07 AM  Fall Risk  Falls in the past year? 0 1 0 0 0  Was there an injury with Fall?  0 0 0 0  Fall Risk Category Calculator  1 0 0 0  Fall Risk Category (Retired)  Low Low Low   (RETIRED) Patient Fall Risk Level   Low fall risk High fall risk   Patient at Risk for Falls Due to  History of fall(s);Impaired balance/gait;Orthopedic patient No Fall Risks History of fall(s);Impaired balance/gait;Impaired mobility History of fall(s);Impaired balance/gait;Impaired mobility  Fall risk Follow up  Falls evaluation completed;Education provided;Falls prevention discussed Falls evaluation completed Falls evaluation completed;Education provided;Falls prevention discussed Falls evaluation completed   Functional Status Survey:    Vitals:   01/28/23 1012  BP: 122/86  Pulse: 61  Resp: 14  Temp: 97.6 F (36.4 C)  SpO2: 93%  Weight: 117 lb 4.8 oz (53.2 kg)  Height: 5' (1.524 m)   Body mass index is 22.91 kg/m. Physical Exam Vitals reviewed.  Constitutional:      General: She is not in acute distress. HENT:     Head: Normocephalic.  Eyes:     General:        Right eye: No discharge.        Left eye: No discharge.     Comments: Left lower ectropion  Cardiovascular:     Rate and Rhythm: Normal rate and regular rhythm.     Pulses: Normal pulses.     Heart sounds: Normal heart sounds.  Pulmonary:     Effort: Pulmonary effort is  normal. No respiratory distress.     Breath sounds: Normal breath sounds. No wheezing.  Abdominal:     General: Bowel sounds are normal. There is no distension.     Palpations: Abdomen is soft.     Tenderness: There is no abdominal tenderness.  Musculoskeletal:     Cervical back: Neck supple.     Right lower leg: Edema present.     Left lower leg: Edema present.     Comments: Non  pitting  Skin:    General: Skin is warm.     Capillary Refill: Capillary refill takes less than 2 seconds.  Neurological:     General: No focal deficit present.     Mental Status: She is alert and oriented to person, place, and time.     Motor: Weakness present.     Gait: Gait abnormal.     Comments: [facility], hoyer  Psychiatric:        Mood and Affect: Mood normal.     Labs reviewed: Recent Labs    03/05/22 0000 03/15/22 0000 10/04/22 0940  NA 137 136* 139  K 3.5 3.7 3.6  CL 98* 97* 101  CO2 32* 34* 30*  BUN 32* 32* 31*  CREATININE 0.9 1.0 1.0  CALCIUM 9.2 9.6 9.1   Recent Labs    10/04/22 0940  AST 15  ALT 9  ALKPHOS 55  ALBUMIN 3.4*   Recent Labs    10/04/22 0940 01/17/23 0000  WBC 7.3 7.8  NEUTROABS 4,256.00 3,767.00  HGB 12.1 11.2*  HCT 36 34*  PLT 199 346   Lab Results  Component Value Date   TSH 2.01 11/02/2021   No results found for: "HGBA1C" Lab Results  Component Value Date   CHOL 175 07/23/2019   HDL 54 07/23/2019   LDLCALC 105 (H) 07/23/2019   TRIG 74 07/23/2019   CHOLHDL 3.2 07/23/2019    Significant Diagnostic Results in last 30 days:  No results found.  Assessment/Plan 1. Generalized anxiety disorder - ongoing - began after fall 03/25 - no panic attacks - occurs in evening - cont ativan 0.5 mg po daily prn x 30 days - LORazepam (ATIVAN) 0.5 MG tablet; Take 1 tablet (0.5 mg total) by mouth daily as needed for anxiety.  Dispense: 30 tablet; Refill: 0  2. Weight loss - began after fall 03/25 - appetite is improving  - cont monthly weight - 04/26  started on Remeron   3. Chronic knee pain after total replacement of right knee joint - ongoing - OT recommended podon boot while in [facility] - cont scheduled tylenol - cont 1/2 norco prn for breakthrough pain  4. Essential hypertension - controlled - cont metoprolol  5. Stage 3a chronic kidney disease (HCC) - avoid nephrotoxic drugs like NSAIDS and dose adjust medications to be renally excreted - encourage hydration with water   6. Painful micturition - past workups negative - symptoms improved with adequate hydration - cont pyridium prn  7. Bilateral leg edema - ongoing - nonpitting, R>L - recent doppler RLE negative for DVT - sedentary lifestyle - cont metolazone  8. RLS (restless legs syndrome) - cont Requip  9. Gait abnormality - cont skilled nursing    Family/ staff Communication: plan discussed with patient and nurse  Labs/tests ordered: none

## 2023-01-29 DIAGNOSIS — M6281 Muscle weakness (generalized): Secondary | ICD-10-CM | POA: Diagnosis not present

## 2023-01-29 DIAGNOSIS — Z9181 History of falling: Secondary | ICD-10-CM | POA: Diagnosis not present

## 2023-01-29 DIAGNOSIS — R278 Other lack of coordination: Secondary | ICD-10-CM | POA: Diagnosis not present

## 2023-01-31 DIAGNOSIS — R278 Other lack of coordination: Secondary | ICD-10-CM | POA: Diagnosis not present

## 2023-01-31 DIAGNOSIS — Z9181 History of falling: Secondary | ICD-10-CM | POA: Diagnosis not present

## 2023-01-31 DIAGNOSIS — M6281 Muscle weakness (generalized): Secondary | ICD-10-CM | POA: Diagnosis not present

## 2023-02-01 DIAGNOSIS — Z9181 History of falling: Secondary | ICD-10-CM | POA: Diagnosis not present

## 2023-02-01 DIAGNOSIS — R278 Other lack of coordination: Secondary | ICD-10-CM | POA: Diagnosis not present

## 2023-02-01 DIAGNOSIS — M6281 Muscle weakness (generalized): Secondary | ICD-10-CM | POA: Diagnosis not present

## 2023-02-04 DIAGNOSIS — Z9181 History of falling: Secondary | ICD-10-CM | POA: Diagnosis not present

## 2023-02-04 DIAGNOSIS — M6281 Muscle weakness (generalized): Secondary | ICD-10-CM | POA: Diagnosis not present

## 2023-02-04 DIAGNOSIS — R278 Other lack of coordination: Secondary | ICD-10-CM | POA: Diagnosis not present

## 2023-02-06 DIAGNOSIS — R278 Other lack of coordination: Secondary | ICD-10-CM | POA: Diagnosis not present

## 2023-02-06 DIAGNOSIS — Z9181 History of falling: Secondary | ICD-10-CM | POA: Diagnosis not present

## 2023-02-06 DIAGNOSIS — M6281 Muscle weakness (generalized): Secondary | ICD-10-CM | POA: Diagnosis not present

## 2023-02-20 ENCOUNTER — Non-Acute Institutional Stay: Payer: PPO | Admitting: Family Medicine

## 2023-02-20 VITALS — BP 112/80 | HR 79 | Temp 97.3°F | Resp 18

## 2023-02-20 DIAGNOSIS — W19XXXS Unspecified fall, sequela: Secondary | ICD-10-CM | POA: Diagnosis not present

## 2023-02-20 DIAGNOSIS — R300 Dysuria: Secondary | ICD-10-CM

## 2023-02-20 DIAGNOSIS — N951 Menopausal and female climacteric states: Secondary | ICD-10-CM | POA: Diagnosis not present

## 2023-02-20 NOTE — Progress Notes (Signed)
Therapist, nutritional Palliative Care Consult Note Telephone: [phone]  Fax: [phone]   Date of encounter: 02/20/23 2:09 PM PATIENT NAME: Ann Jensen [address] [place] [address]   [phone] (home)  DOB: Jul 24, 1922 MRN: [id number] PRIMARY CARE PROVIDER:    Mahlon Gammon, MD,  [address] [place] [address] [phone]  REFERRING PROVIDER:   Mahlon Gammon, MD [address] [place],  [place] [address] [phone]  Emergency Contact:    Contact Information     Name Relation Home Work [place] Son [phone]  [phone]   Ann, Jensen [phone]         Health Care POA-Dr Ann Jensen   I met face to face with patient in [facility]. Palliative Care was asked to follow this patient by consultation request of Ann Gammon, MD to address advance care planning and complex medical decision making. This is a follow up visit.  ADVANCE CARE PLANNING/GOALS OF CARE:  Review of an existing advance directive document.  CODE STATUS: MOST as of 11/18/18: DNR/DNI with comfort measures Use of antibiotics and IV fluids on a case by case, time limited basis No feeding tube.   ASSESSMENT AND / RECOMMENDATIONS:  PPS: 50%   Menopausal vaginal dryness/dysuria Educated without other symptoms-frequency, fever, back/flank pain it is likely due to postmenopausal estrogen deficiency/dryness. Encouraged to increase fluid intake. Recommend use of refresh or replens periodically for vaginal dryness  2.  Fall, sequela No noted injury. Has Tylenol prn for joint pain Has seat belt which she can open and close if needed which will help her keep her position in the chair   Follow up Palliative Care Visit:  Palliative Care continuing to follow up by monitoring for changes in appetite, weight, functional and cognitive status for chronic disease progression and management in agreement with patient's  stated goals of care. Next visit in 4 weeks or prn.  This visit was coded based on medical decision making (MDM).  Chief Complaint  Palliative Care is continuing to follow patient for chronic medical management in setting of advanced age, debility and CKD. Pt c/o dysuria at initiation of voiding.  HISTORY OF PRESENT ILLNESS: Ann Jensen is a 89 y.o. year old female with c/o "stinging pain" at initiation of voiding which lasts about a minute.  She has been treated previously for UTI but last UA did not show any infection.  She has tried estrogen replacement and was unable to tolerate. She has tried using Vaseline and pyridium with no relief. She states not having had a recent UA and questions possible infection. She recently slid out of power chair with no injury and now has a seatbelt.  Denies fever, nausea, vomiting, urgency, back/flank pain.  Has some chronic constipation. Sleeps well.  Says she doesn't like the food that it is tasteless.   ACTIVITIES OF DAILY LIVING: CONTINENT OF BLADDER/BOWEL? No BATHING/DRESSING-assistance, FEEDING-independent  MOBILITY:   POWER CHAIR/BEDBOUND?-dependent for transfers  APPETITE? good WEIGHT: 120.3 lbs May 7; 10/23/22, weight was 123 lbs 6 oz   CURRENT PROBLEM LIST:  Patient Active Problem List   Diagnosis Date Noted   Menopausal vaginal dryness 09/26/2022   Elevated IOP 03/02/2021   Right thigh pain 11/01/2020   Skin lesion of scalp 07/05/2020   Dysuria 05/23/2020   Seborrhea capitis in adult 03/24/2020   Slow transit constipation 09/08/2019   Generalized weakness 09/03/2019   Fall 09/03/2019   Gait abnormality 09/03/2019  Anemia 01/20/2018   Hyperlipidemia LDL goal <100 01/20/2018   CKD (chronic kidney disease) stage 3, GFR 30-59 ml/min (HCC) 01/20/2018   Essential hypertension 07/22/2017   PVD (peripheral vascular disease) (HCC) 07/22/2017   Posterior tibial tendon dysfunction, right 07/22/2017   Osteoporosis without current  pathological fracture 07/22/2017   Insomnia secondary to depression with anxiety 07/22/2017   RLS (restless legs syndrome) 07/22/2017   Osteoarthritis of multiple joints 07/22/2017   PAST MEDICAL HISTORY:  Active Ambulatory Problems    Diagnosis Date Noted   Essential hypertension 07/22/2017   PVD (peripheral vascular disease) (HCC) 07/22/2017   Posterior tibial tendon dysfunction, right 07/22/2017   Osteoporosis without current pathological fracture 07/22/2017   Insomnia secondary to depression with anxiety 07/22/2017   RLS (restless legs syndrome) 07/22/2017   Osteoarthritis of multiple joints 07/22/2017   Anemia 01/20/2018   Hyperlipidemia LDL goal <100 01/20/2018   CKD (chronic kidney disease) stage 3, GFR 30-59 ml/min (HCC) 01/20/2018   Generalized weakness 09/03/2019   Fall 09/03/2019   Gait abnormality 09/03/2019   Slow transit constipation 09/08/2019   Seborrhea capitis in adult 03/24/2020   Dysuria 05/23/2020   Skin lesion of scalp 07/05/2020   Right thigh pain 11/01/2020   Elevated IOP 03/02/2021   Menopausal vaginal dryness 09/26/2022   Resolved Ambulatory Problems    Diagnosis Date Noted   Obesity (BMI 30-39.9) 01/20/2018   Laceration of right lower extremity 11/15/2018   Advanced care planning/counseling discussion 11/18/2018   Dehiscence of wound of skin 11/25/2018   Weight gain 11/25/2018   Cellulitis of right leg 04/15/2019   Contusion of right knee 09/03/2019   Community acquired pneumonia of right lung 09/08/2019   Closed compression fracture of L5 lumbar vertebra, sequela 09/08/2019   UTI (urinary tract infection) 11/28/2020   Past Medical History:  Diagnosis Date   Hypertension    OA (osteoarthritis)    Osteoporosis    Renal cyst    SOCIAL HX:  Social History   Tobacco Use   Smoking status: Former    Types: E-cigarettes   Smokeless tobacco: Never   Tobacco comments:    when she was 89 years old  Substance Use Topics   Alcohol use: No    FAMILY HX:  Family History  Problem Relation Age of Onset   Colon cancer Mother    Arthritis Mother    Heart disease Father    Heart attack Father    Migraines Sister    Allergies Sister    Asthma Sister        Preferred Pharmacy: ALLERGIES: No Known Allergies   PERTINENT MEDICATIONS:  Outpatient Encounter Medications as of 02/20/2023  Medication Sig   acetaminophen (TYLENOL) 325 MG tablet Take 650 mg by mouth every 4 (four) hours as needed.   acetaminophen (TYLENOL) 325 MG tablet Take 650 mg by mouth 3 (three) times daily.   aspirin EC 81 MG tablet Take 81 mg by mouth 2 (two) times a week. Swallow whole.   Biotin 5000 MCG TABS Take 5,000 mcg by mouth daily.    bisacodyl (DULCOLAX) 10 MG suppository Place 10 mg rectally every other day. As needed   Cholecalciferol (VITAMIN D) 50 MCG (2000 UT) CAPS Take 2,000 Units by mouth daily.    Eyelid Cleansers (OCUSOFT EYELID CLEANSING) PADS Apply 1 Pad topically every morning.   HYDROcodone-acetaminophen (NORCO/VICODIN) 5-325 MG tablet Take 0.5 tablets by mouth 3 (three) times daily as needed for moderate pain.   LORazepam (ATIVAN) 0.5 MG tablet  Take 1 tablet (0.5 mg total) by mouth daily as needed for anxiety.   Magnesium Hydroxide (MILK OF MAGNESIA PO) Take 30 mLs by mouth daily as needed (for constipation.).   metolazone (ZAROXOLYN) 2.5 MG tablet Take 2.5 mg by mouth 2 (two) times a week. On Wed. And Sat. only   metoprolol tartrate (LOPRESSOR) 25 MG tablet Take 25 mg by mouth every morning.   metoprolol tartrate (LOPRESSOR) 50 MG tablet Take 50 mg by mouth every evening.   mirtazapine (REMERON) 7.5 MG tablet Take 7.5 mg by mouth at bedtime.   multivitamin-lutein (OCUVITE-LUTEIN) CAPS capsule Take 1 capsule by mouth 2 (two) times daily.    phenazopyridine (PYRIDIUM) 100 MG tablet Take 100 mg by mouth daily as needed for pain. Give one by mouth every 24 hours as needed for painful dysuria   polyethylene glycol (MIRALAX / GLYCOLAX) 17 g  packet Take 17 g by mouth daily.   polyvinyl alcohol (LIQUIFILM TEARS) 1.4 % ophthalmic solution Place 1 drop into both eyes 3 (three) times daily.   Potassium Chloride ER 20 MEQ TBCR Take 1 tablet by mouth 2 (two) times a week. On Wed. And Sat.   rOPINIRole (REQUIP) 1 MG tablet TAKE 1 TABLET EACH EVENING.   sennosides-docusate sodium (SENOKOT-S) 8.6-50 MG tablet Take 2 tablets by mouth daily. as needed for constipation at bedtime.   white petrolatum (VASELINE) ointment Apply 1 Application topically at bedtime. Apply to vaginal folds topically for dryness.   White Petrolatum-Mineral Oil (GENTEAL TEARS NIGHT-TIME) OINT Apply to Jensen as directed. Instill 0.25 inch in both eyes at bedtime for Dry eyes to conjunctival sac   zinc oxide 20 % ointment Apply 1 application topically as needed for irritation. Special Instructions: To buttocks after every incontinent episode and as needed for redness. May keep at bedside.   No facility-administered encounter medications on file as of 02/20/2023.    History obtained from review of EMR, discussion with facility staff/caregiver and/or patient.   CBC    Component Value Date/Time   WBC 7.8 01/17/2023 0000   WBC 5.6 07/23/2019 0000   RBC 3.07 (A) 01/17/2023 0000   HGB 11.2 (A) 01/17/2023 0000   HCT 34 (A) 01/17/2023 0000   PLT 346 01/17/2023 0000   MCV 101.9 (H) 07/23/2019 0000   MCH 34.0 (H) 07/23/2019 0000   MCHC 33.3 07/23/2019 0000   RDW 13.6 07/23/2019 0000   LYMPHSABS 2,072 07/23/2019 0000   EOSABS 123 07/23/2019 0000   BASOSABS 50 07/23/2019 0000    CMP    Latest Ref Rng & Units 10/04/2022    9:40 AM 03/15/2022   12:00 AM 03/05/2022   12:00 AM  CMP  BUN 4 - 21 31     32     32      Creatinine 0.5 - 1.1 1.0     1.0     0.9      Sodium 137 - 147 139     136     137      Potassium 3.5 - 5.1 mEq/L 3.6     3.7     3.5      Chloride 99 - 108 101     97     98      CO2 13 - 22 30     34     32      Calcium 8.7 - 10.7 9.1     9.6     9.2  Alkaline Phos 25 - 125 55        AST 13 - 35 15        ALT 7 - 35 U/L 9           This result is from an external source.    LFTs    Latest Ref Rng & Units 10/04/2022    9:40 AM 11/02/2021   12:00 AM 04/15/2021   12:00 AM  Hepatic Function  Albumin 3.5 - 5.0 3.4     3.6     3.1      AST 13 - 35 15     11     11       ALT 7 - 35 U/L 9     8     6       Alk Phosphatase 25 - 125 55     65     58      Bilirubin, Direct 0.01 - 0.4   0.1         This result is from an external source.     I reviewed available labs, medications, imaging, studies and related documents from the EMR.  Records reviewed and summarized above.   Physical Exam: GENERAL: NAD LUNGS: CTAB, no increased work of breathing, room air CARDIAC:  S1S2, RRR with no MRG, 1+ pedal edema/ no cyanosis ABD:  Normo-active BS x 4 quads, soft, non-tender EXTREMITIES: Normal ROM, noted deformity of bilateral feet, strength equal, No muscle atrophy/subcutaneous fat loss NEURO:  Noted weakness of BLE, no cognitive impairment PSYCH:  non-anxious affect, A & O x 3  Thank you for the opportunity to participate in the care [email or web address]@. Please call our main office at [phone] if we can be of additional assistance.    Joycelyn Man FNP-C  [email or web address] .[facility]  Phone:  [phone]

## 2023-02-21 ENCOUNTER — Encounter: Payer: Self-pay | Admitting: Orthopedic Surgery

## 2023-02-21 ENCOUNTER — Non-Acute Institutional Stay (SKILLED_NURSING_FACILITY): Payer: PPO | Admitting: Orthopedic Surgery

## 2023-02-21 DIAGNOSIS — C44602 Unspecified malignant neoplasm of skin of right upper limb, including shoulder: Secondary | ICD-10-CM | POA: Diagnosis not present

## 2023-02-21 DIAGNOSIS — R309 Painful micturition, unspecified: Secondary | ICD-10-CM | POA: Diagnosis not present

## 2023-02-21 DIAGNOSIS — F411 Generalized anxiety disorder: Secondary | ICD-10-CM

## 2023-02-21 DIAGNOSIS — G8929 Other chronic pain: Secondary | ICD-10-CM

## 2023-02-21 DIAGNOSIS — R600 Localized edema: Secondary | ICD-10-CM

## 2023-02-21 DIAGNOSIS — M25561 Pain in right knee: Secondary | ICD-10-CM

## 2023-02-21 DIAGNOSIS — I10 Essential (primary) hypertension: Secondary | ICD-10-CM

## 2023-02-21 DIAGNOSIS — G2581 Restless legs syndrome: Secondary | ICD-10-CM | POA: Diagnosis not present

## 2023-02-21 DIAGNOSIS — N1831 Chronic kidney disease, stage 3a: Secondary | ICD-10-CM

## 2023-02-21 DIAGNOSIS — R634 Abnormal weight loss: Secondary | ICD-10-CM

## 2023-02-21 DIAGNOSIS — Z96651 Presence of right artificial knee joint: Secondary | ICD-10-CM | POA: Diagnosis not present

## 2023-02-21 NOTE — Progress Notes (Signed)
Location:   [facility] Room Number: 1-A Place of Service:  SNF [phone]) Provider:  Hazle Nordmann, NP  PCP: Mahlon Gammon, MD  Patient Care Team: Mahlon Gammon, MD as PCP - General (Internal Medicine) Frederica Kuster, MD as Attending Physician [facility] Medicine)  Extended Emergency Contact Information Primary Emergency Contact: [facility] Address: [phone]          [place] [address] [place] Home Phone: [phone] Mobile Phone: [phone] Relation: Son Secondary Emergency Contact: [facility] Address: [address]          [place], [place] [address] [place] Home Phone: [phone] Relation: Son  Code Status:  DNR Goals of care: Advanced Directive information    02/21/2023   10:09 AM  Advanced Directives  Does Patient Have a Medical Advance Directive? Yes  Type of Estate agent of [place];Living will;Out of facility DNR (pink MOST or yellow form)  Does patient want to make changes to medical advance directive? No - Patient declined  Copy of Healthcare Power of Attorney in Chart? Yes - validated most recent copy scanned in chart (See row information)     Chief Complaint  Patient presents with   Medical Management of Chronic Issues    Routine Visit.    Immunizations    Discuss the need for Shingrix vaccine.     HPI:  Pt is a 89 y.o. female seen today for medical management of chronic diseases.    She currently resides on the skilled nursing unit at [facility]. PMH: hypertension, PVD, osteoarthritis of multiple joints, CKD stage 3, posterior tendon dysfunction with right foot drop, constipation, restless leg syndrome and anxiety.     HTN-  BUN/creat 31/1.0 10/04/2022, remains on metoprolol CKD- GFR 53 (01/04)> 49 (03/2022) Anxiety- increased in evening, remains on Ativan prn Weight loss- began 03/25 after fall,04/26 started on Remeron, does not like food served at facility,  sister brings her candy,  see trends below Chronic right knee pain-  h/o RTKA/ right foot drop, recent doppler negative for DVT> confirmed Bakers cyst, OT recommended Podon boot in [facility], on Noro prn for pain Painful micturition/ vaginal dryness- ongoing, unsuccessful trial of Uribel 06/2021 and Estrace (breast tenderness), past urine cultures unremarkable, refused urology consult, remains on pyridium prn, symptoms improved with good hydration BLE- R>L, given metolazone/potassium twice weekly, unsuccessful trial of leg wrappings  RLS- remains on requip qhs Neoplasm of right arm- evaluated by dermatology with multiple biopsies in past, area has grown back, she is refusing to have any future biopsies done   No recent falls or injuries.   Recent blood pressures:  05/21- 117/75  05/14- 127/82  05/07- 110/75  Recent weights:  05/03- 120.3 lbs  04/25- 117.3 lbs  03/03- 120.5 lbs      Past Medical History:  Diagnosis Date   Advanced care planning/counseling discussion 11/18/2018   Anemia    Closed compression fracture of L5 lumbar vertebra, sequela 09/08/2019   Hypertension    OA (osteoarthritis)    Obesity (BMI 30-39.9) 01/20/2018   Osteoporosis    Renal cyst    UTI (urinary tract infection) 11/28/2020   11/28/20 urine culture, Citrobacter, Keflex 500mg  tid x 7 day.    Weight gain 11/25/2018   Past Surgical History:  Procedure Laterality Date   APPENDECTOMY     BLADDER SUSPENSION     CATARACT EXTRACTION     CESAREAN SECTION     REPLACEMENT  TOTAL KNEE  1995   TONSILLECTOMY      No Known Allergies  Allergies as of 02/21/2023   No Known Allergies      Medication List        Accurate as of Feb 21, 2023 10:10 AM. If you have any questions, ask your nurse or doctor.          acetaminophen 325 MG tablet Commonly known as: TYLENOL Take 650 mg by mouth every 4 (four) hours as needed.   acetaminophen 325 MG tablet Commonly known as: TYLENOL Take 650 mg by mouth 3  (three) times daily.   aspirin EC 81 MG tablet Take 81 mg by mouth 2 (two) times a week. Swallow whole.   Biotin 5000 MCG Tabs Take 5,000 mcg by mouth daily.   bisacodyl 10 MG suppository Commonly known as: DULCOLAX Place 10 mg rectally every other day. As needed   GenTeal Tears Night-Time Oint Apply to eye as directed. Instill 0.25 inch in both eyes at bedtime for Dry eyes to conjunctival sac   HYDROcodone-acetaminophen 5-325 MG tablet Commonly known as: NORCO/VICODIN Take 0.5 tablets by mouth 3 (three) times daily as needed for moderate pain.   LORazepam 0.5 MG tablet Commonly known as: ATIVAN Take 1 tablet (0.5 mg total) by mouth daily as needed for anxiety.   metolazone 2.5 MG tablet Commonly known as: ZAROXOLYN Take 2.5 mg by mouth 2 (two) times a week. On Wed. And Sat. only   metoprolol tartrate 25 MG tablet Commonly known as: LOPRESSOR Take 25 mg by mouth every morning.   metoprolol tartrate 50 MG tablet Commonly known as: LOPRESSOR Take 50 mg by mouth every evening.   MILK OF MAGNESIA PO Take 30 mLs by mouth daily as needed (for constipation.).   mirtazapine 7.5 MG tablet Commonly known as: REMERON Take 7.5 mg by mouth at bedtime.   multivitamin-lutein Caps capsule Take 1 capsule by mouth 2 (two) times daily.   OcuSoft Eyelid Cleansing Pads Apply 1 Pad topically every morning.   phenazopyridine 100 MG tablet Commonly known as: PYRIDIUM Take 100 mg by mouth daily as needed for pain. Give one by mouth every 24 hours as needed for painful dysuria   polyethylene glycol 17 g packet Commonly known as: MIRALAX / GLYCOLAX Take 17 g by mouth daily.   polyvinyl alcohol 1.4 % ophthalmic solution Commonly known as: LIQUIFILM TEARS Place 1 drop into both eyes 3 (three) times daily.   Potassium Chloride ER 20 MEQ Tbcr Take 1 tablet by mouth 2 (two) times a week. On Wed. And Sat.   rOPINIRole 1 MG tablet Commonly known as: REQUIP TAKE 1 TABLET EACH EVENING.    sennosides-docusate sodium 8.6-50 MG tablet Commonly known as: SENOKOT-S Take 2 tablets by mouth daily. as needed for constipation at bedtime.   Vaseline ointment Generic drug: white petrolatum Apply 1 Application topically at bedtime. Apply to vaginal folds topically for dryness.   Vitamin D 50 MCG (2000 UT) Caps Take 2,000 Units by mouth daily.   zinc oxide 20 % ointment Apply 1 application topically as needed for irritation. Special Instructions: To buttocks after every incontinent episode and as needed for redness. May keep at bedside.        Review of Systems  Constitutional:  Positive for appetite change. Negative for activity change.  HENT:  Negative for sore throat and trouble swallowing.   Eyes:  Negative for visual disturbance.  Respiratory:  Negative for cough, shortness of breath and wheezing.  Cardiovascular:  Positive for leg swelling. Negative for chest pain.  Gastrointestinal:  Positive for constipation. Negative for abdominal distention and abdominal pain.  Genitourinary:  Positive for dysuria. Negative for frequency, hematuria and vaginal bleeding.  Musculoskeletal:  Positive for arthralgias and gait problem.  Skin:  Positive for color change.  Neurological:  Positive for weakness. Negative for dizziness and headaches.  Psychiatric/Behavioral:  Negative for confusion and dysphoric mood. The patient is nervous/anxious.     Immunization History  Administered Date(s) Administered   Covid-19,MRNA Vaccine(Spikevax)56mos. thru 59yrs 08/07/2022   Fluad Quad(high Dose 65+) 07/25/2022   Influenza, High Dose Seasonal PF 07/10/2017, 07/15/2019, 07/05/2020, 07/25/2021   Influenza,inj,Quad PF,6+ Mos 07/03/2018   Influenza-Unspecified 07/04/2015, 07/10/2017   Moderna SARS-COV2 Booster Vaccination 03/08/2021, 03/14/2022   Moderna Sars-Covid-2 Vaccination 10/05/2019, 11/02/2019, 08/15/2020, 06/21/2021   PFIZER(Purple Top)SARS-COV-2 Vaccination 06/21/2021   Pneumococcal  Conjugate-13 08/18/2014   Pneumococcal Polysaccharide-23 07/26/2010   Tdap 11/12/2018   Pertinent  Health Maintenance Due  Topic Date Due   INFLUENZA VACCINE  05/02/2023   DEXA SCAN  Discontinued      08/12/2019    2:51 PM 09/29/2021    1:43 PM 10/12/2022    9:50 AM 10/12/2022    2:29 PM 10/26/2022    9:07 AM  Fall Risk  Falls in the past year? 0 1 0 0 0  Was there an injury with Fall?  0 0 0 0  Fall Risk Category Calculator  1 0 0 0  Fall Risk Category (Retired)  Low Low Low   (RETIRED) Patient Fall Risk Level   Low fall risk High fall risk   Patient at Risk for Falls Due to  History of fall(s);Impaired balance/gait;Orthopedic patient No Fall Risks History of fall(s);Impaired balance/gait;Impaired mobility History of fall(s);Impaired balance/gait;Impaired mobility  Fall risk Follow up  Falls evaluation completed;Education provided;Falls prevention discussed Falls evaluation completed Falls evaluation completed;Education provided;Falls prevention discussed Falls evaluation completed   Functional Status Survey:    Vitals:   02/21/23 0950  BP: 117/75  Pulse: 71  Resp: 18  Temp: (!) 96.8 F (36 C)  SpO2: 91%  Weight: 120 lb 4.8 oz (54.6 kg)  Height: 5' (1.524 m)   Body mass index is 23.49 kg/m. Physical Exam Vitals reviewed.  Constitutional:      General: She is not in acute distress. HENT:     Head: Normocephalic.     Right Ear: There is no impacted cerumen.     Left Ear: There is no impacted cerumen.     Nose: Nose normal.     Mouth/Throat:     Mouth: Mucous membranes are moist.  Eyes:     General:        Right eye: No discharge.        Left eye: No discharge.  Cardiovascular:     Rate and Rhythm: Normal rate and regular rhythm.     Pulses: Normal pulses.     Heart sounds: Normal heart sounds.  Pulmonary:     Effort: Pulmonary effort is normal. No respiratory distress.     Breath sounds: Normal breath sounds. No wheezing.  Abdominal:     General: Bowel  sounds are normal. There is no distension.     Palpations: Abdomen is soft.     Tenderness: There is no abdominal tenderness.  Musculoskeletal:     Cervical back: Neck supple.     Right lower leg: Edema present.     Left lower leg: Edema present.  Comments: Non pitting, right foot drop  Skin:    General: Skin is warm.     Capillary Refill: Capillary refill takes less than 2 seconds.     Comments: Right anterior forearm with raised, pea-sized lesion, small horn like appearence in center, non tender, no erythema/ swelling/ drainage.   Neurological:     General: No focal deficit present.     Mental Status: She is alert. Mental status is at baseline.     Motor: Weakness present.     Gait: Gait abnormal.     Comments: [facility]  Psychiatric:        Mood and Affect: Mood normal.     Labs reviewed: Recent Labs    03/05/22 0000 03/15/22 0000 10/04/22 0940  NA 137 136* 139  K 3.5 3.7 3.6  CL 98* 97* 101  CO2 32* 34* 30*  BUN 32* 32* 31*  CREATININE 0.9 1.0 1.0  CALCIUM 9.2 9.6 9.1   Recent Labs    10/04/22 0940  AST 15  ALT 9  ALKPHOS 55  ALBUMIN 3.4*   Recent Labs    10/04/22 0940 01/17/23 0000  WBC 7.3 7.8  NEUTROABS 4,256.00 3,767.00  HGB 12.1 11.2*  HCT 36 34*  PLT 199 346   Lab Results  Component Value Date   TSH 2.01 11/02/2021   No results found for: "HGBA1C" Lab Results  Component Value Date   CHOL 175 07/23/2019   HDL 54 07/23/2019   LDLCALC 105 (H) 07/23/2019   TRIG 74 07/23/2019   CHOLHDL 3.2 07/23/2019    Significant Diagnostic Results in last 30 days:  No results found.  Assessment/Plan 1. Essential hypertension - controlled with metoprolol  2. Stage 3a chronic kidney disease (HCC) - encourage hydration with water - avoid NSAIDS  3. Generalized anxiety disorder - improved with Ativan prn  4. Weight loss - stable - started Remeron last month - does not like food served, sister bringing her candy - not willing to try Boost - cont  monthly weights  5. Chronic knee pain after total replacement of right knee joint - ongoing - recent doppler revealed Bakers Cyst - cont hydrocodone prn for pain  6. Painful micturition - ongoing - past workup unremarkable - encourage hydration - cont pyridium prn  7. Bilateral leg edema - non pitting - cont Metolazone  8. RLS (restless legs syndrome) - cont Requip  9. Malignant neoplasm of skin of arm, right - ongoing - biopsied several times by in house dermatology - suspicious for malignancy - characteristics of cutaneous horn - refusing future biopsies    Family/ staff Communication: plan discussed with patient and nurse  Labs/tests ordered: none

## 2023-02-26 ENCOUNTER — Encounter: Payer: Self-pay | Admitting: Family Medicine

## 2023-03-07 DIAGNOSIS — E538 Deficiency of other specified B group vitamins: Secondary | ICD-10-CM | POA: Diagnosis not present

## 2023-03-11 LAB — VITAMIN B12: Vitamin B-12: 715

## 2023-03-19 ENCOUNTER — Non-Acute Institutional Stay (SKILLED_NURSING_FACILITY): Payer: PPO | Admitting: Adult Health

## 2023-03-19 ENCOUNTER — Encounter: Payer: Self-pay | Admitting: Adult Health

## 2023-03-19 DIAGNOSIS — H1032 Unspecified acute conjunctivitis, left eye: Secondary | ICD-10-CM

## 2023-03-19 DIAGNOSIS — I10 Essential (primary) hypertension: Secondary | ICD-10-CM

## 2023-03-19 NOTE — Progress Notes (Signed)
Location:  [facility] Room Number: 1-A Place of Service:  SNF (31) Provider:  Kenard Gower, DNP, FNP-BC  Patient Care Team: Mahlon Gammon, MD as PCP - General (Internal Medicine) Frederica Kuster, MD as Attending Physician [facility] Medicine)  Extended Emergency Contact Information Primary Emergency Contact: [facility] Address: [id number]          [place] [address] [place] Home Phone: [phone] Mobile Phone: [phone] Relation: Son Secondary Emergency Contact: [facility] Address: [address]          [place], [place] [address] [place] Home Phone: [phone] Relation: Son  Code Status:  DNR  Goals of care: Advanced Directive information    03/19/2023   10:46 AM  Advanced Directives  Does Patient Have a Medical Advance Directive? Yes  Type of Estate agent of [place];Living will;Out of facility DNR (pink MOST or yellow form)  Does patient want to make changes to medical advance directive? No - Patient declined  Copy of Healthcare Power of Attorney in Chart? Yes - validated most recent copy scanned in chart (See row information)     Chief Complaint  Patient presents with   Acute Visit    Left eye redness    HPI:  Pt is a 89 y.o. female seen today for an acute visit regarding left eye redness. She is a long-term care resident of [facility]. She has erythematous left conjunctiva. She stated that when she wakes up in the morning, she has crusty shut left eye. Left eye is watery. She denies itching and change in vision.   BP 105/70, takes Metoprolol for hypertension.  Past Medical History:  Diagnosis Date   Advanced care planning/counseling discussion 11/18/2018   Anemia    Closed compression fracture of L5 lumbar vertebra, sequela 09/08/2019   Hypertension    OA (osteoarthritis)    Obesity (BMI 30-39.9) 01/20/2018   Osteoporosis    Renal cyst     UTI (urinary tract infection) 11/28/2020   11/28/20 urine culture, Citrobacter, Keflex 500mg  tid x 7 day.    Weight gain 11/25/2018   Past Surgical History:  Procedure Laterality Date   APPENDECTOMY     BLADDER SUSPENSION     CATARACT EXTRACTION     CESAREAN SECTION     REPLACEMENT TOTAL KNEE  1995   TONSILLECTOMY      No Known Allergies  Outpatient Encounter Medications as of 03/19/2023  Medication Sig   acetaminophen (TYLENOL) 325 MG tablet Take 650 mg by mouth every 4 (four) hours as needed.   acetaminophen (TYLENOL) 325 MG tablet Take 650 mg by mouth 3 (three) times daily.   aspirin EC 81 MG tablet Take 81 mg by mouth 2 (two) times a week. Swallow whole.   Biotin 5000 MCG TABS Take 5,000 mcg by mouth daily.    bisacodyl (DULCOLAX) 10 MG suppository Place 10 mg rectally every other day. As needed   Cholecalciferol (VITAMIN D) 50 MCG (2000 UT) CAPS Take 2,000 Units by mouth daily.    Eyelid Cleansers (OCUSOFT EYELID CLEANSING) PADS Apply 1 Pad topically every morning.   Magnesium Hydroxide (MILK OF MAGNESIA PO) Take 30 mLs by mouth daily as needed (for constipation.).   metolazone (ZAROXOLYN) 2.5 MG tablet Take 2.5 mg by mouth 2 (two) times a week. On Wed. And Sat. only   metoprolol tartrate (LOPRESSOR) 25 MG tablet Take 25 mg by mouth every morning.   metoprolol  tartrate (LOPRESSOR) 50 MG tablet Take 50 mg by mouth every evening.   mirtazapine (REMERON) 7.5 MG tablet Take 7.5 mg by mouth at bedtime.   multivitamin-lutein (OCUVITE-LUTEIN) CAPS capsule Take 1 capsule by mouth 2 (two) times daily.    polyethylene glycol (MIRALAX / GLYCOLAX) 17 g packet Take 17 g by mouth daily.   polyvinyl alcohol (LIQUIFILM TEARS) 1.4 % ophthalmic solution Place 1 drop into both eyes 3 (three) times daily.   Potassium Chloride ER 20 MEQ TBCR Take 1 tablet by mouth 2 (two) times a week. On Wed. And Sat.   rOPINIRole (REQUIP) 1 MG tablet TAKE 1 TABLET EACH EVENING.   sennosides-docusate sodium  (SENOKOT-S) 8.6-50 MG tablet Take 2 tablets by mouth daily. as needed for constipation at bedtime.   white petrolatum (VASELINE) ointment Apply 1 Application topically at bedtime. Apply to vaginal folds topically for dryness.   White Petrolatum-Mineral Oil (GENTEAL TEARS NIGHT-TIME) OINT Apply to eye as directed. Instill 0.25 inch in both eyes at bedtime for Dry eyes to conjunctival sac   zinc oxide 20 % ointment Apply 1 application topically as needed for irritation. Special Instructions: To buttocks after every incontinent episode and as needed for redness. May keep at bedside.   [DISCONTINUED] HYDROcodone-acetaminophen (NORCO/VICODIN) 5-325 MG tablet Take 0.5 tablets by mouth 3 (three) times daily as needed for moderate pain.   No facility-administered encounter medications on file as of 03/19/2023.    Review of Systems  Constitutional:  Negative for appetite change, chills, fatigue and fever.  HENT:  Negative for congestion, hearing loss, rhinorrhea and sore throat.   Eyes:  Positive for discharge and redness. Negative for pain, itching and visual disturbance.  Respiratory:  Negative for cough, shortness of breath and wheezing.   Cardiovascular:  Negative for chest pain, palpitations and leg swelling.  Gastrointestinal:  Negative for abdominal pain, constipation, diarrhea, nausea and vomiting.  Genitourinary:  Negative for dysuria.  Musculoskeletal:  Negative for arthralgias, back pain and myalgias.  Skin:  Negative for color change, rash and wound.  Neurological:  Negative for dizziness, weakness and headaches.  Psychiatric/Behavioral:  Negative for behavioral problems. The patient is not nervous/anxious.       Immunization History  Administered Date(s) Administered   Covid-19,MRNA Vaccine(Spikevax)57mos. thru 23yrs 08/07/2022   Fluad Quad(high Dose 65+) 07/25/2022   Influenza, High Dose Seasonal PF 07/10/2017, 07/15/2019, 07/05/2020, 07/25/2021   Influenza,inj,Quad PF,6+ Mos  07/03/2018   Influenza-Unspecified 07/04/2015, 07/10/2017   Moderna SARS-COV2 Booster Vaccination 03/08/2021, 03/14/2022   Moderna Sars-Covid-2 Vaccination 10/05/2019, 11/02/2019, 08/15/2020, 06/21/2021   PFIZER(Purple Top)SARS-COV-2 Vaccination 06/21/2021   Pneumococcal Conjugate-13 08/18/2014   Pneumococcal Polysaccharide-23 07/26/2010   Tdap 11/12/2018   Pertinent  Health Maintenance Due  Topic Date Due   INFLUENZA VACCINE  05/02/2023   DEXA SCAN  Discontinued      08/12/2019    2:51 PM 09/29/2021    1:43 PM 10/12/2022    9:50 AM 10/12/2022    2:29 PM 10/26/2022    9:07 AM  Fall Risk  Falls in the past year? 0 1 0 0 0  Was there an injury with Fall?  0 0 0 0  Fall Risk Category Calculator  1 0 0 0  Fall Risk Category (Retired)  Low Low Low   (RETIRED) Patient Fall Risk Level   Low fall risk High fall risk   Patient at Risk for Falls Due to  History of fall(s);Impaired balance/gait;Orthopedic patient No Fall Risks History of fall(s);Impaired balance/gait;Impaired mobility History  of fall(s);Impaired balance/gait;Impaired mobility  Fall risk Follow up  Falls evaluation completed;Education provided;Falls prevention discussed Falls evaluation completed Falls evaluation completed;Education provided;Falls prevention discussed Falls evaluation completed     Vitals:   03/19/23 1048  BP: 105/70  Pulse: 63  Resp: 18  Temp: (!) 96.1 F (35.6 C)  SpO2: 90%  Weight: 119 lb 3.2 oz (54.1 kg)  Height: 5' (1.524 m)   Body mass index is 23.28 kg/m.  Physical Exam Constitutional:      Appearance: Normal appearance.  HENT:     Head: Normocephalic and atraumatic.     Nose: Nose normal.     Mouth/Throat:     Mouth: Mucous membranes are moist.  Eyes:     Conjunctiva/sclera: Conjunctivae normal.     Comments: Left conjunctiva erythematous  Cardiovascular:     Rate and Rhythm: Normal rate and regular rhythm.  Pulmonary:     Effort: Pulmonary effort is normal.     Breath sounds:  Normal breath sounds.  Abdominal:     General: Bowel sounds are normal.     Palpations: Abdomen is soft.  Musculoskeletal:     Cervical back: Normal range of motion.     Comments: -  Right lower leg with boot. -  use electric wheelchair  Skin:    General: Skin is warm and dry.  Neurological:     Mental Status: She is alert and oriented to person, place, and time. Mental status is at baseline.  Psychiatric:        Mood and Affect: Mood normal.        Behavior: Behavior normal.        Thought Content: Thought content normal.        Judgment: Judgment normal.        Labs reviewed: Recent Labs    10/04/22 0940  NA 139  K 3.6  CL 101  CO2 30*  BUN 31*  CREATININE 1.0  CALCIUM 9.1   Recent Labs    10/04/22 0940  AST 15  ALT 9  ALKPHOS 55  ALBUMIN 3.4*   Recent Labs    10/04/22 0940 01/17/23 0000  WBC 7.3 7.8  NEUTROABS 4,256.00 3,767.00  HGB 12.1 11.2*  HCT 36 34*  PLT 199 346   Lab Results  Component Value Date   TSH 2.01 11/02/2021   No results found for: "HGBA1C" Lab Results  Component Value Date   CHOL 175 07/23/2019   HDL 54 07/23/2019   LDLCALC 105 (H) 07/23/2019   TRIG 74 07/23/2019   CHOLHDL 3.2 07/23/2019    Significant Diagnostic Results in last 30 days:  No results found.  Assessment/Plan  1. Acute bacterial conjunctivitis of left eye -  will start Ofloxacin 0.3% ophthalmic drops 1 drop to left eye QID X 7 days -  eye care daily  2. Essential hypertension -  BPs stable -  continue Metoprolol     Family/ staff Communication: Discussed plan of care with resident and charge nurse.  Labs/tests ordered: None    Kenard Gower, DNP, MSN, FNP-BC [facility] [phone] (Monday-Friday 8:00 a.m. - 5:00 p.m.) [phone] (after hours)

## 2023-03-21 ENCOUNTER — Encounter: Payer: Self-pay | Admitting: Internal Medicine

## 2023-03-21 ENCOUNTER — Non-Acute Institutional Stay (SKILLED_NURSING_FACILITY): Payer: PPO | Admitting: Internal Medicine

## 2023-03-21 DIAGNOSIS — R600 Localized edema: Secondary | ICD-10-CM

## 2023-03-21 DIAGNOSIS — F411 Generalized anxiety disorder: Secondary | ICD-10-CM | POA: Diagnosis not present

## 2023-03-21 DIAGNOSIS — I10 Essential (primary) hypertension: Secondary | ICD-10-CM | POA: Diagnosis not present

## 2023-03-21 DIAGNOSIS — R309 Painful micturition, unspecified: Secondary | ICD-10-CM

## 2023-03-21 DIAGNOSIS — R634 Abnormal weight loss: Secondary | ICD-10-CM

## 2023-03-21 DIAGNOSIS — G2581 Restless legs syndrome: Secondary | ICD-10-CM

## 2023-03-21 DIAGNOSIS — N1831 Chronic kidney disease, stage 3a: Secondary | ICD-10-CM | POA: Diagnosis not present

## 2023-03-21 NOTE — Progress Notes (Signed)
Location:  [facility] Room Number: 1A Place of Service:  SNF [phone]) Provider:  Mahlon Gammon, MD   Mahlon Gammon, MD  Patient Care Team: Mahlon Gammon, MD as PCP - General (Internal Medicine) Frederica Kuster, MD as Attending Physician [facility])  Extended Emergency Contact Information Primary Emergency Contact: [facility] Address: [phone]          [place] [address] [place] Home Phone: [phone] Mobile Phone: [phone] Relation: Son Secondary Emergency Contact: [facility] Address: [address]          [place], [place] [address] [place] Home Phone: [phone] Relation: Son  Code Status:  DNR Palliative CARE Goals of care: Advanced Directive information    03/19/2023   10:46 AM  Advanced Directives  Does Patient Have a Medical Advance Directive? Yes  Type of Estate agent of [place];Living will;Out of facility DNR (pink MOST or yellow form)  Does patient want to make changes to medical advance directive? No - Patient declined  Copy of Healthcare Power of Attorney in Chart? Yes - validated most recent copy scanned in chart (See row information)     Chief Complaint  Patient presents with   Medical Management of Chronic Issues    Patient is being seen for a routine    Immunizations    Discussed the need for a covid vaccine     HPI:  Pt is a 89 y.o. female seen today for Chronic Issues management  Lives in SNF    Patient has h/o Hypertension, Anxiety, Restless leg Syndrome, Insomnia, Arthritis and Posterior Tendon Dysfunction with right foot Valgus in Right Knee with inability to Ambulate  Weight loss Doing well oN Remeron Has gained few pounds but her Appetite stays Poor Left Eye Stye LE edema Better on Zaroxolyn  Dysuria Continues to c/o Pain during Micturition  UA negative for any infection This is her chronic Issue So far it has been decided not  to do  further work up   Stable other wise Doing well Cognitively Moves around in her power chair Needs [employer] for [employer] Readings from Last 3 Encounters:  03/21/23 119 lb 3.2 oz (54.1 kg)  03/19/23 119 lb 3.2 oz (54.1 kg)  02/21/23 120 lb 4.8 oz (54.6 kg)     Past Medical History:  Diagnosis Date   Advanced care planning/counseling discussion 11/18/2018   Anemia    Closed compression fracture of L5 lumbar vertebra, sequela 09/08/2019   Hypertension    OA (osteoarthritis)    Obesity (BMI 30-39.9) 01/20/2018   Osteoporosis    Renal cyst    UTI (urinary tract infection) 11/28/2020   11/28/20 urine culture, Citrobacter, Keflex 500mg  tid x 7 day.    Weight gain 11/25/2018   Past Surgical History:  Procedure Laterality Date   APPENDECTOMY     BLADDER SUSPENSION     CATARACT EXTRACTION     CESAREAN SECTION     REPLACEMENT TOTAL KNEE  1995   TONSILLECTOMY      No Known Allergies  Outpatient Encounter Medications as of 03/21/2023  Medication Sig   acetaminophen (TYLENOL) 325 MG tablet Take 650 mg by mouth every 4 (four) hours as needed.   acetaminophen (TYLENOL) 325 MG tablet Take 650 mg by mouth 3 (three) times daily.   aspirin EC 81 MG tablet Take 81 mg by mouth 2 (two) times a week. Swallow whole.   Biotin 5000 MCG TABS  Take 5,000 mcg by mouth daily.    bisacodyl (DULCOLAX) 10 MG suppository Place 10 mg rectally every other day. As needed   Cholecalciferol (VITAMIN D) 50 MCG (2000 UT) CAPS Take 2,000 Units by mouth daily.    Eyelid Cleansers (OCUSOFT EYELID CLEANSING) PADS Apply 1 Pad topically every morning.   Magnesium Hydroxide (MILK OF MAGNESIA PO) Take 30 mLs by mouth daily as needed (for constipation.).   metolazone (ZAROXOLYN) 2.5 MG tablet Take 2.5 mg by mouth 2 (two) times a week. On Wed. And Sat. only   metoprolol tartrate (LOPRESSOR) 25 MG tablet Take 25 mg by mouth every morning.   metoprolol tartrate (LOPRESSOR) 50 MG tablet Take 50 mg by mouth every  evening.   mirtazapine (REMERON) 7.5 MG tablet Take 7.5 mg by mouth at bedtime.   multivitamin-lutein (OCUVITE-LUTEIN) CAPS capsule Take 1 capsule by mouth 2 (two) times daily.    ofloxacin (OCUFLOX) 0.3 % ophthalmic solution Place 1 drop into the left eye 4 (four) times daily.   polyethylene glycol (MIRALAX / GLYCOLAX) 17 g packet Take 17 g by mouth daily.   polyvinyl alcohol (LIQUIFILM TEARS) 1.4 % ophthalmic solution Place 1 drop into both eyes 3 (three) times daily.   Potassium Chloride ER 20 MEQ TBCR Take 1 tablet by mouth 2 (two) times a week. On Wed. And Sat.   rOPINIRole (REQUIP) 1 MG tablet TAKE 1 TABLET EACH EVENING.   sennosides-docusate sodium (SENOKOT-S) 8.6-50 MG tablet Take 2 tablets by mouth daily. as needed for constipation at bedtime.   white petrolatum (VASELINE) ointment Apply 1 Application topically at bedtime. Apply to vaginal folds topically for dryness.   White Petrolatum-Mineral Oil (GENTEAL TEARS NIGHT-TIME) OINT Apply to eye as directed. Instill 0.25 inch in both eyes at bedtime for Dry eyes to conjunctival sac   zinc oxide 20 % ointment Apply 1 application topically as needed for irritation. Special Instructions: To buttocks after every incontinent episode and as needed for redness. May keep at bedside.   No facility-administered encounter medications on file as of 03/21/2023.    Review of Systems  Constitutional:  Negative for activity change and appetite change.  HENT: Negative.    Respiratory:  Negative for cough and shortness of breath.   Cardiovascular:  Positive for leg swelling.  Gastrointestinal:  Negative for constipation.  Genitourinary:  Positive for dysuria.  Musculoskeletal:  Positive for gait problem. Negative for arthralgias and myalgias.  Skin: Negative.   Neurological:  Negative for dizziness and weakness.  Psychiatric/Behavioral:  Negative for confusion, dysphoric mood and sleep disturbance.     Immunization History  Administered Date(s)  Administered   Covid-19,MRNA Vaccine(Spikevax)50mos. thru 4yrs 08/07/2022   Fluad Quad(high Dose 65+) 07/25/2022   Influenza, High Dose Seasonal PF 07/10/2017, 07/15/2019, 07/05/2020, 07/25/2021   Influenza,inj,Quad PF,6+ Mos 07/03/2018   Influenza-Unspecified 07/04/2015, 07/10/2017   Moderna SARS-COV2 Booster Vaccination 03/08/2021, 03/14/2022   Moderna Sars-Covid-2 Vaccination 10/05/2019, 11/02/2019, 08/15/2020, 06/21/2021   PFIZER(Purple Top)SARS-COV-2 Vaccination 06/21/2021   Pneumococcal Conjugate-13 08/18/2014   Pneumococcal Polysaccharide-23 07/26/2010   Tdap 11/12/2018   Pertinent  Health Maintenance Due  Topic Date Due   INFLUENZA VACCINE  05/02/2023   DEXA SCAN  Discontinued      08/12/2019    2:51 PM 09/29/2021    1:43 PM 10/12/2022    9:50 AM 10/12/2022    2:29 PM 10/26/2022    9:07 AM  Fall Risk  Falls in the past year? 0 1 0 0 0  Was there an  injury with Fall?  0 0 0 0  Fall Risk Category Calculator  1 0 0 0  Fall Risk Category (Retired)  Low Low Low   (RETIRED) Patient Fall Risk Level   Low fall risk High fall risk   Patient at Risk for Falls Due to  History of fall(s);Impaired balance/gait;Orthopedic patient No Fall Risks History of fall(s);Impaired balance/gait;Impaired mobility History of fall(s);Impaired balance/gait;Impaired mobility  Fall risk Follow up  Falls evaluation completed;Education provided;Falls prevention discussed Falls evaluation completed Falls evaluation completed;Education provided;Falls prevention discussed Falls evaluation completed   Functional Status Survey:    Vitals:   03/21/23 1432  BP: 115/80  Pulse: 67  Resp: 15  Temp: 97.6 F (36.4 C)  TempSrc: Temporal  SpO2: 99%  Weight: 119 lb 3.2 oz (54.1 kg)  Height: 5' (1.524 m)   Body mass index is 23.28 kg/m. Physical Exam Vitals reviewed.  Constitutional:      Appearance: Normal appearance.  HENT:     Head: Normocephalic.     Nose: Nose normal.     Mouth/Throat:     Mouth:  Mucous membranes are moist.     Pharynx: Oropharynx is clear.  Eyes:     Pupils: Pupils are equal, round, and reactive to light.  Cardiovascular:     Rate and Rhythm: Normal rate and regular rhythm.     Pulses: Normal pulses.     Heart sounds: Normal heart sounds. No murmur heard. Pulmonary:     Effort: Pulmonary effort is normal.     Breath sounds: Normal breath sounds.  Abdominal:     General: Abdomen is flat. Bowel sounds are normal.     Palpations: Abdomen is soft.  Musculoskeletal:     Cervical back: Neck supple.  Skin:    General: Skin is warm.  Neurological:     Mental Status: She is alert and oriented to person, place, and time.  Psychiatric:        Mood and Affect: Mood normal.        Thought Content: Thought content normal.     Labs reviewed: Recent Labs    10/04/22 0940  NA 139  K 3.6  CL 101  CO2 30*  BUN 31*  CREATININE 1.0  CALCIUM 9.1   Recent Labs    10/04/22 0940  AST 15  ALT 9  ALKPHOS 55  ALBUMIN 3.4*   Recent Labs    10/04/22 0940 01/17/23 0000  WBC 7.3 7.8  NEUTROABS 4,256.00 3,767.00  HGB 12.1 11.2*  HCT 36 34*  PLT 199 346   Lab Results  Component Value Date   TSH 2.01 11/02/2021   No results found for: "HGBA1C" Lab Results  Component Value Date   CHOL 175 07/23/2019   HDL 54 07/23/2019   LDLCALC 105 (H) 07/23/2019   TRIG 74 07/23/2019   CHOLHDL 3.2 07/23/2019    Significant Diagnostic Results in last 30 days:  No results found.  Assessment/Plan 1. Essential hypertension Doing well with Metoprolol  2. Stage 3a chronic kidney disease (HCC) Creat stable  3. Generalized anxiety disorder Off Ativan now Only Remeron  4. Weight loss Stable on Remeron Appetite still poor  5. Painful micturition Have tried different options Will try estrace cream to see if it helps  6. Bilateral leg edema Low dose of Zaroxolyn   7. RLS (restless legs syndrome) Requip   Family/ staff Communication:   Labs/tests  ordered:

## 2023-04-16 DIAGNOSIS — D485 Neoplasm of uncertain behavior of skin: Secondary | ICD-10-CM | POA: Diagnosis not present

## 2023-04-16 DIAGNOSIS — L570 Actinic keratosis: Secondary | ICD-10-CM | POA: Diagnosis not present

## 2023-04-16 DIAGNOSIS — L219 Seborrheic dermatitis, unspecified: Secondary | ICD-10-CM | POA: Diagnosis not present

## 2023-04-19 ENCOUNTER — Non-Acute Institutional Stay (SKILLED_NURSING_FACILITY): Payer: PPO | Admitting: Orthopedic Surgery

## 2023-04-19 DIAGNOSIS — G2581 Restless legs syndrome: Secondary | ICD-10-CM

## 2023-04-19 DIAGNOSIS — I10 Essential (primary) hypertension: Secondary | ICD-10-CM

## 2023-04-19 DIAGNOSIS — M159 Polyosteoarthritis, unspecified: Secondary | ICD-10-CM | POA: Diagnosis not present

## 2023-04-19 DIAGNOSIS — R309 Painful micturition, unspecified: Secondary | ICD-10-CM

## 2023-04-19 DIAGNOSIS — H1032 Unspecified acute conjunctivitis, left eye: Secondary | ICD-10-CM | POA: Diagnosis not present

## 2023-04-19 DIAGNOSIS — N1832 Chronic kidney disease, stage 3b: Secondary | ICD-10-CM | POA: Diagnosis not present

## 2023-04-19 DIAGNOSIS — R600 Localized edema: Secondary | ICD-10-CM | POA: Diagnosis not present

## 2023-04-19 DIAGNOSIS — R634 Abnormal weight loss: Secondary | ICD-10-CM | POA: Diagnosis not present

## 2023-04-22 ENCOUNTER — Encounter: Payer: Self-pay | Admitting: Orthopedic Surgery

## 2023-04-22 NOTE — Progress Notes (Signed)
Location:  [facility] Room Number: 1/A Place of Service:  SNF [phone]) Provider:  Octavia Heir, NP   Mahlon Gammon, MD  Patient Care Team: Mahlon Gammon, MD as PCP - General (Internal Medicine) Frederica Kuster, MD as Attending Physician [facility] Medicine)  Extended Emergency Contact Information Primary Emergency Contact: [facility] Address: [id number]          [place] [address] [place] Home Phone: [phone] Mobile Phone: [phone] Relation: Son Secondary Emergency Contact: [facility] Address: [address]          [place], [place] [address] [place] Home Phone: [phone] Relation: Son  Code Status:  DNR Goals of care: Advanced Directive information    03/21/2023    2:44 PM  Advanced Directives  Does Patient Have a Medical Advance Directive? Yes  Type of Estate agent of [place];Living will;Out of facility DNR (pink MOST or yellow form)  Does patient want to make changes to medical advance directive? No - Patient declined  Copy of Healthcare Power of Attorney in Chart? Yes - validated most recent copy scanned in chart (See row information)     Chief Complaint  Patient presents with   Medical Management of Chronic Issues    HPI:  Pt is a 89 y.o. female seen today for medical management of chronic diseases.   She currently resides on the skilled nursing unit at [facility]. PMH: hypertension, PVD, osteoarthritis of multiple joints, CKD stage 3, posterior tendon dysfunction with right foot drop, constipation, restless leg syndrome and anxiety.     Left eye bacterial conjunctivitis- ofloxacin 0.3% prescribed 06/18, today redness to same eye has returned, also having increased drainage HTN-  BUN/creat 31/1.0 10/04/2022, remains on metoprolol CKD- GFR 53 (01/04)> 49 (03/2022) Weight loss- see trends below, does not like food served at [facility], remains on low dose mirtazapine,  family requesting to discontinue medication OA/chronic right knee pain-  h/o RTKA/ right foot drop, recent doppler negative for DVT> confirmed Bakers cyst, off norco, remains on tylenol  Painful micturition/ vaginal dryness- ongoing, unsuccessful trial of Uribel 06/2021 and Estrace (breast tenderness), past urine cultures unremarkable, refused urology consult, remains on pyridium prn, symptoms improved with good hydration BLE- R>L, given metolazone/potassium twice weekly, unsuccessful trial of leg wrappings  RLS- remains on requip at bedtime  No recent falls or injuries.   Recent blood pressures:  07/16- 105/76  07/09- 125/87  07/02- 132/64  Recent weights:  07/04- 122.6 lbs  06/01- 119.2 lbs  05/03- 120.3 lbs      Past Medical History:  Diagnosis Date   Advanced care planning/counseling discussion 11/18/2018   Anemia    Closed compression fracture of L5 lumbar vertebra, sequela 09/08/2019   Hypertension    OA (osteoarthritis)    Obesity (BMI 30-39.9) 01/20/2018   Osteoporosis    Renal cyst    UTI (urinary tract infection) 11/28/2020   11/28/20 urine culture, Citrobacter, Keflex 500mg  tid x 7 day.    Weight gain 11/25/2018   Past Surgical History:  Procedure Laterality Date   APPENDECTOMY     BLADDER SUSPENSION     CATARACT EXTRACTION     CESAREAN SECTION     REPLACEMENT TOTAL KNEE  1995   TONSILLECTOMY      No Known Allergies  Outpatient Encounter Medications as of 04/19/2023  Medication Sig   acetaminophen (TYLENOL) 325 MG tablet Take 650 mg by mouth every 4 (four)  hours as needed.   acetaminophen (TYLENOL) 325 MG tablet Take 650 mg by mouth 3 (three) times daily.   aspirin EC 81 MG tablet Take 81 mg by mouth 2 (two) times a week. Swallow whole.   Biotin 5000 MCG TABS Take 5,000 mcg by mouth daily.    bisacodyl (DULCOLAX) 10 MG suppository Place 10 mg rectally every other day. As needed   Cholecalciferol (VITAMIN D) 50 MCG (2000 UT) CAPS Take 2,000 Units by mouth  daily.    Eyelid Cleansers (OCUSOFT EYELID CLEANSING) PADS Apply 1 Pad topically every morning.   Magnesium Hydroxide (MILK OF MAGNESIA PO) Take 30 mLs by mouth daily as needed (for constipation.).   metolazone (ZAROXOLYN) 2.5 MG tablet Take 2.5 mg by mouth 2 (two) times a week. On Wed. And Sat. only   metoprolol tartrate (LOPRESSOR) 25 MG tablet Take 25 mg by mouth every morning.   metoprolol tartrate (LOPRESSOR) 50 MG tablet Take 50 mg by mouth every evening.   mirtazapine (REMERON) 7.5 MG tablet Take 7.5 mg by mouth at bedtime.   multivitamin-lutein (OCUVITE-LUTEIN) CAPS capsule Take 1 capsule by mouth 2 (two) times daily.    ofloxacin (OCUFLOX) 0.3 % ophthalmic solution Place 1 drop into the left eye 4 (four) times daily.   polyethylene glycol (MIRALAX / GLYCOLAX) 17 g packet Take 17 g by mouth daily.   polyvinyl alcohol (LIQUIFILM TEARS) 1.4 % ophthalmic solution Place 1 drop into both eyes 3 (three) times daily.   Potassium Chloride ER 20 MEQ TBCR Take 1 tablet by mouth 2 (two) times a week. On Wed. And Sat.   rOPINIRole (REQUIP) 1 MG tablet TAKE 1 TABLET EACH EVENING.   sennosides-docusate sodium (SENOKOT-S) 8.6-50 MG tablet Take 2 tablets by mouth daily. as needed for constipation at bedtime.   white petrolatum (VASELINE) ointment Apply 1 Application topically at bedtime. Apply to vaginal folds topically for dryness.   White Petrolatum-Mineral Oil (GENTEAL TEARS NIGHT-TIME) OINT Apply to eye as directed. Instill 0.25 inch in both eyes at bedtime for Dry eyes to conjunctival sac   zinc oxide 20 % ointment Apply 1 application topically as needed for irritation. Special Instructions: To buttocks after every incontinent episode and as needed for redness. May keep at bedside.   No facility-administered encounter medications on file as of 04/19/2023.    Review of Systems  Constitutional:  Negative for activity change and appetite change.  HENT:  Positive for hearing loss. Negative for trouble  swallowing.   Eyes:  Positive for discharge and redness. Negative for visual disturbance.  Respiratory:  Negative for cough, shortness of breath and wheezing.   Cardiovascular:  Positive for leg swelling. Negative for chest pain.  Gastrointestinal:  Positive for constipation. Negative for abdominal distention and abdominal pain.  Genitourinary:  Positive for dysuria and frequency. Negative for hematuria and vaginal bleeding.  Musculoskeletal:  Positive for arthralgias and gait problem.  Skin:  Negative for wound.  Neurological:  Positive for weakness and numbness. Negative for dizziness and headaches.  Psychiatric/Behavioral:  Positive for confusion. Negative for dysphoric mood and sleep disturbance. The patient is not nervous/anxious.     Immunization History  Administered Date(s) Administered   Covid-19,MRNA Vaccine(Spikevax)67mos. thru 38yrs 08/07/2022   Fluad Quad(high Dose 65+) 07/25/2022   Influenza, High Dose Seasonal PF 07/10/2017, 07/15/2019, 07/05/2020, 07/25/2021   Influenza,inj,Quad PF,6+ Mos 07/03/2018   Influenza-Unspecified 07/04/2015, 07/10/2017   Moderna SARS-COV2 Booster Vaccination 03/08/2021, 03/14/2022   Moderna Sars-Covid-2 Vaccination 10/05/2019, 11/02/2019, 08/15/2020, 06/21/2021  PFIZER(Purple Top)SARS-COV-2 Vaccination 06/21/2021   Pneumococcal Conjugate-13 08/18/2014   Pneumococcal Polysaccharide-23 07/26/2010   Tdap 11/12/2018   Pertinent  Health Maintenance Due  Topic Date Due   INFLUENZA VACCINE  05/02/2023   DEXA SCAN  Discontinued      08/12/2019    2:51 PM 09/29/2021    1:43 PM 10/12/2022    9:50 AM 10/12/2022    2:29 PM 10/26/2022    9:07 AM  Fall Risk  Falls in the past year? 0 1 0 0 0  Was there an injury with Fall?  0 0 0 0  Fall Risk Category Calculator  1 0 0 0  Fall Risk Category (Retired)  Low Low Low   (RETIRED) Patient Fall Risk Level   Low fall risk High fall risk   Patient at Risk for Falls Due to  History of fall(s);Impaired  balance/gait;Orthopedic patient No Fall Risks History of fall(s);Impaired balance/gait;Impaired mobility History of fall(s);Impaired balance/gait;Impaired mobility  Fall risk Follow up  Falls evaluation completed;Education provided;Falls prevention discussed Falls evaluation completed Falls evaluation completed;Education provided;Falls prevention discussed Falls evaluation completed   Functional Status Survey:    Vitals:   04/22/23 0957  BP: 105/76  Pulse: 68  Resp: 20  Temp: (!) 96.4 F (35.8 C)  SpO2: 95%  Weight: 122 lb 9.6 oz (55.6 kg)  Height: 5' (1.524 m)   Body mass index is 23.94 kg/m. Physical Exam Vitals reviewed.  Constitutional:      General: She is not in acute distress. HENT:     Head: Normocephalic.     Right Ear: There is no impacted cerumen.     Left Ear: There is no impacted cerumen.     Nose: Nose normal.     Mouth/Throat:     Mouth: Mucous membranes are moist.  Eyes:     General:        Right eye: No discharge.        Left eye: Discharge present.    Comments: Left sclera red, purulent drainage, left eye ectropion appears slightly larger  Cardiovascular:     Rate and Rhythm: Normal rate and regular rhythm.     Pulses: Normal pulses.     Heart sounds: Murmur heard.  Pulmonary:     Effort: Pulmonary effort is normal. No respiratory distress.     Breath sounds: Normal breath sounds. No wheezing or rales.  Abdominal:     General: Bowel sounds are normal. There is no distension.     Palpations: Abdomen is soft.     Tenderness: There is no abdominal tenderness.  Musculoskeletal:     Cervical back: Neck supple.     Right lower leg: Edema present.     Left lower leg: Edema present.     Comments: Non pitting  Skin:    General: Skin is warm.     Capillary Refill: Capillary refill takes less than 2 seconds.  Neurological:     General: No focal deficit present.     Mental Status: She is alert. Mental status is at baseline.     Motor: Weakness present.      Gait: Gait abnormal.     Comments: PWC  Psychiatric:        Mood and Affect: Mood normal.     Labs reviewed: Recent Labs    10/04/22 0940  NA 139  K 3.6  CL 101  CO2 30*  BUN 31*  CREATININE 1.0  CALCIUM 9.1   Recent Labs  10/04/22 0940  AST 15  ALT 9  ALKPHOS 55  ALBUMIN 3.4*   Recent Labs    10/04/22 0940 01/17/23 0000  WBC 7.3 7.8  NEUTROABS 4,256.00 3,767.00  HGB 12.1 11.2*  HCT 36 34*  PLT 199 346   Lab Results  Component Value Date   TSH 2.01 11/02/2021   No results found for: "HGBA1C" Lab Results  Component Value Date   CHOL 175 07/23/2019   HDL 54 07/23/2019   LDLCALC 105 (H) 07/23/2019   TRIG 74 07/23/2019   CHOLHDL 3.2 07/23/2019    Significant Diagnostic Results in last 30 days:  No results found.  Assessment/Plan 1. Acute bacterial conjunctivitis of left eye - ongoing - improved with ofloxacin 0.3% 06/18 - left eye redness and purulent drainage today - restart ofloxacin TID x 5 days  2. Essential hypertension - controlled - cont metoprolol  3. Stage 3b chronic kidney disease (HCC) - encourage hydration with water - avoid NSAIDS  4. Weight loss - weight stable - plan to discontinue mirtazapine> family request  5. Primary osteoarthritis involving multiple joints - ongoing - cont tylenol   6. Painful micturition - ongoing - unsuccessful trial of Uribel and estradiol cream - past urine cultures no growth - cont pyridium prn   7. Bilateral leg edema - non pitting - cont metolazone  8. RLS (restless legs syndrome) - cont Requip    Family/ staff Communication: plan discussed with patient and nurse  Labs/tests ordered:  needs bmp next month

## 2023-04-22 NOTE — Progress Notes (Signed)
This encounter was created in error - please disregard.

## 2023-04-26 ENCOUNTER — Encounter: Payer: Self-pay | Admitting: Orthopedic Surgery

## 2023-04-26 ENCOUNTER — Non-Acute Institutional Stay (SKILLED_NURSING_FACILITY): Payer: PPO | Admitting: Orthopedic Surgery

## 2023-04-26 DIAGNOSIS — H1032 Unspecified acute conjunctivitis, left eye: Secondary | ICD-10-CM | POA: Diagnosis not present

## 2023-04-26 DIAGNOSIS — F419 Anxiety disorder, unspecified: Secondary | ICD-10-CM

## 2023-04-26 DIAGNOSIS — F5105 Insomnia due to other mental disorder: Secondary | ICD-10-CM | POA: Diagnosis not present

## 2023-04-26 MED ORDER — LORAZEPAM 0.5 MG PO TABS
0.5000 mg | ORAL_TABLET | Freq: Every evening | ORAL | 1 refills | Status: DC | PRN
Start: 2023-04-26 — End: 2023-06-27

## 2023-04-26 NOTE — Progress Notes (Signed)
Location:   [facility] Room Number: 1-A Place of Service:  SNF [phone]) Provider:  Hazle Nordmann, NP  PCP: Mahlon Gammon, MD  Patient Care Team: Mahlon Gammon, MD as PCP - General (Internal Medicine) Frederica Kuster, MD as Attending Physician [facility] Medicine)  Extended Emergency Contact Information Primary Emergency Contact: [facility] Address: [phone]          [place] [address] [place] Home Phone: [phone] Mobile Phone: [phone] Relation: Son Secondary Emergency Contact: [facility] Address: [address]          [place], [place] [address] [place] Home Phone: [phone] Relation: Son  Code Status:  DNR Goals of care: Advanced Directive information    04/26/2023    9:57 AM  Advanced Directives  Does Patient Have a Medical Advance Directive? Yes  Type of Estate agent of [place];Living will;Out of facility DNR (pink MOST or yellow form)  Does patient want to make changes to medical advance directive? No - Patient declined  Copy of Healthcare Power of Attorney in Chart? Yes - validated most recent copy scanned in chart (See row information)     Chief Complaint  Patient presents with   Acute Visit    Insomnia.     HPI:  Pt is a 89 y.o. female seen today for an acute visit due to insomnia.   She currently resides on the skilled nursing unit at [facility]. PMH: hypertension, PVD, osteoarthritis of multiple joints, CKD stage 3, posterior tendon dysfunction with right foot drop, constipation, restless leg syndrome and anxiety.    She reports insomnia x 2 days. 07/19 plan to wean off mirtazapine per family request and stabilized mood/weights. Mirtazapine will be discontinued 07/29. She denies pain or anxiety are preventing sleep. No recent panic attacks or changes in mood. H/o using ativan prn to help with sleep. She describes feeling "restless" when she has trouble  sleeping. Afebrile. Vitals stable.   Left eye redness and drainage have resolved.   Past Medical History:  Diagnosis Date   Advanced care planning/counseling discussion 11/18/2018   Anemia    Closed compression fracture of L5 lumbar vertebra, sequela 09/08/2019   Hypertension    OA (osteoarthritis)    Obesity (BMI 30-39.9) 01/20/2018   Osteoporosis    Renal cyst    UTI (urinary tract infection) 11/28/2020   11/28/20 urine culture, Citrobacter, Keflex 500mg  tid x 7 day.    Weight gain 11/25/2018   Past Surgical History:  Procedure Laterality Date   APPENDECTOMY     BLADDER SUSPENSION     CATARACT EXTRACTION     CESAREAN SECTION     REPLACEMENT TOTAL KNEE  1995   TONSILLECTOMY      No Known Allergies  Allergies as of 04/26/2023   No Known Allergies      Medication List        Accurate as of April 26, 2023  9:58 AM. If you have any questions, ask your nurse or doctor.          acetaminophen 325 MG tablet Commonly known as: TYLENOL Take 650 mg by mouth every 4 (four) hours as needed.   acetaminophen 325 MG tablet Commonly known as: TYLENOL Take 650 mg by mouth 3 (three) times daily.   aspirin EC 81 MG tablet Take 81 mg by mouth 2 (two) times a week. Swallow whole.   Biotin 5000 MCG Tabs Take 5,000 mcg by  mouth daily.   bisacodyl 10 MG suppository Commonly known as: DULCOLAX Place 10 mg rectally every other day. As needed   GenTeal Tears Night-Time Oint Apply to eye as directed. Instill 0.25 inch in both eyes at bedtime for Dry eyes to conjunctival sac   ketoconazole 2 % shampoo Commonly known as: NIZORAL Apply 1 Application topically 2 (two) times a week. Tuesday & Friday.   metolazone 2.5 MG tablet Commonly known as: ZAROXOLYN Take 2.5 mg by mouth 2 (two) times a week. On Wed. And Sat. only   metoprolol tartrate 25 MG tablet Commonly known as: LOPRESSOR Take 25 mg by mouth every morning.   metoprolol tartrate 50 MG tablet Commonly known as:  LOPRESSOR Take 50 mg by mouth every evening.   MILK OF MAGNESIA PO Take 30 mLs by mouth daily as needed (for constipation.).   mirtazapine 7.5 MG tablet Commonly known as: REMERON Take 7.5 mg by mouth every other day. Discontinue after 1 week   multivitamin-lutein Caps capsule Take 1 capsule by mouth 2 (two) times daily.   OcuSoft Eyelid Cleansing Pads Apply 1 Pad topically every morning.   phenazopyridine 100 MG tablet Commonly known as: PYRIDIUM Take 100 mg by mouth daily as needed for pain.   polyethylene glycol 17 g packet Commonly known as: MIRALAX / GLYCOLAX Take 17 g by mouth daily.   polyvinyl alcohol 1.4 % ophthalmic solution Commonly known as: LIQUIFILM TEARS Place 1 drop into both eyes 3 (three) times daily.   Potassium Chloride ER 20 MEQ Tbcr Take 1 tablet by mouth 2 (two) times a week. On Wed. And Sat.   rOPINIRole 1 MG tablet Commonly known as: REQUIP TAKE 1 TABLET EACH EVENING.   sennosides-docusate sodium 8.6-50 MG tablet Commonly known as: SENOKOT-S Take 2 tablets by mouth daily. as needed for constipation at bedtime.   Vaseline ointment Generic drug: white petrolatum Apply 1 Application topically at bedtime. Apply to vaginal folds topically for dryness.   Vitamin D 50 MCG (2000 UT) Caps Take 2,000 Units by mouth daily.   zinc oxide 20 % ointment Apply 1 application topically as needed for irritation. Special Instructions: To buttocks after every incontinent episode and as needed for redness. May keep at bedside.        Review of Systems  Constitutional:  Positive for fatigue. Negative for activity change and appetite change.  Respiratory:  Negative for cough, shortness of breath and wheezing.   Cardiovascular:  Positive for leg swelling. Negative for chest pain.  Gastrointestinal:  Negative for abdominal distention and abdominal pain.  Genitourinary:  Positive for dysuria and frequency. Negative for hematuria.  Musculoskeletal:  Positive  for arthralgias and gait problem.  Psychiatric/Behavioral:  Positive for confusion and sleep disturbance. Negative for dysphoric mood. The patient is nervous/anxious.     Immunization History  Administered Date(s) Administered   Covid-19,MRNA Vaccine(Spikevax)64mos. thru 47yrs 08/07/2022   Fluad Quad(high Dose 65+) 07/25/2022   Influenza, High Dose Seasonal PF 07/10/2017, 07/15/2019, 07/05/2020, 07/25/2021   Influenza,inj,Quad PF,6+ Mos 07/03/2018   Influenza-Unspecified 07/04/2015, 07/10/2017   Moderna SARS-COV2 Booster Vaccination 03/08/2021, 03/14/2022   Moderna Sars-Covid-2 Vaccination 10/05/2019, 11/02/2019, 08/15/2020, 06/21/2021   PFIZER(Purple Top)SARS-COV-2 Vaccination 06/21/2021   Pneumococcal Conjugate-13 08/18/2014   Pneumococcal Polysaccharide-23 07/26/2010   Tdap 11/12/2018   Pertinent  Health Maintenance Due  Topic Date Due   INFLUENZA VACCINE  05/02/2023   DEXA SCAN  Discontinued      08/12/2019    2:51 PM 09/29/2021    1:43  PM 10/12/2022    9:50 AM 10/12/2022    2:29 PM 10/26/2022    9:07 AM  Fall Risk  Falls in the past year? 0 1 0 0 0  Was there an injury with Fall?  0 0 0 0  Fall Risk Category Calculator  1 0 0 0  Fall Risk Category (Retired)  Low Low Low   (RETIRED) Patient Fall Risk Level   Low fall risk High fall risk   Patient at Risk for Falls Due to  History of fall(s);Impaired balance/gait;Orthopedic patient No Fall Risks History of fall(s);Impaired balance/gait;Impaired mobility History of fall(s);Impaired balance/gait;Impaired mobility  Fall risk Follow up  Falls evaluation completed;Education provided;Falls prevention discussed Falls evaluation completed Falls evaluation completed;Education provided;Falls prevention discussed Falls evaluation completed   Functional Status Survey:    Vitals:   04/26/23 0952  BP: 106/78  Pulse: 68  Resp: 20  Temp: (!) 97 F (36.1 C)  SpO2: 92%  Weight: 122 lb 4.8 oz (55.5 kg)  Height: 5' (1.524 m)   Body  mass index is 23.89 kg/m. Physical Exam Vitals reviewed.  Constitutional:      General: She is not in acute distress. HENT:     Head: Normocephalic.  Eyes:     General:        Right eye: No discharge.        Left eye: No discharge.     Conjunctiva/sclera: Conjunctivae normal.     Comments: Left lower lid ectropion  Cardiovascular:     Rate and Rhythm: Normal rate and regular rhythm.     Pulses: Normal pulses.     Heart sounds: Murmur heard.  Pulmonary:     Effort: Pulmonary effort is normal. No respiratory distress.     Breath sounds: Normal breath sounds. No wheezing.  Abdominal:     General: Bowel sounds are normal.     Palpations: Abdomen is soft.  Musculoskeletal:     Cervical back: Neck supple.  Skin:    General: Skin is warm.     Capillary Refill: Capillary refill takes less than 2 seconds.  Neurological:     General: No focal deficit present.     Mental Status: She is alert and oriented to person, place, and time.     Motor: Weakness present.     Gait: Gait abnormal.     Comments: PWC  Psychiatric:        Mood and Affect: Mood normal.     Labs reviewed: Recent Labs    10/04/22 0940  NA 139  K 3.6  CL 101  CO2 30*  BUN 31*  CREATININE 1.0  CALCIUM 9.1   Recent Labs    10/04/22 0940  AST 15  ALT 9  ALKPHOS 55  ALBUMIN 3.4*   Recent Labs    10/04/22 0940 01/17/23 0000  WBC 7.3 7.8  NEUTROABS 4,256.00 3,767.00  HGB 12.1 11.2*  HCT 36 34*  PLT 199 346   Lab Results  Component Value Date   TSH 2.01 11/02/2021   No results found for: "HGBA1C" Lab Results  Component Value Date   CHOL 175 07/23/2019   HDL 54 07/23/2019   LDLCALC 105 (H) 07/23/2019   TRIG 74 07/23/2019   CHOLHDL 3.2 07/23/2019    Significant Diagnostic Results in last 30 days:  No results found.  Assessment/Plan 1. Insomnia secondary to anxiety - suspect due to weaning/discontinuation of mirtazapine - restless at night - start ativan at bedtime prn - LORazepam  (  ATIVAN) 0.5 MG tablet; Take 1 tablet (0.5 mg total) by mouth at bedtime as needed for anxiety.  Dispense: 30 tablet; Refill: 1  2. Acute bacterial conjunctivitis of left eye - left eye no redness or drainage - completed Ofloxacin 0.3% x 5 days    Family/ staff Communication: plan discussed with patient and nurse  Labs/tests ordered:   none

## 2023-05-21 DIAGNOSIS — H353133 Nonexudative age-related macular degeneration, bilateral, advanced atrophic without subfoveal involvement: Secondary | ICD-10-CM | POA: Diagnosis not present

## 2023-05-24 ENCOUNTER — Encounter: Payer: Self-pay | Admitting: Orthopedic Surgery

## 2023-05-24 ENCOUNTER — Non-Acute Institutional Stay (SKILLED_NURSING_FACILITY): Payer: PPO | Admitting: Orthopedic Surgery

## 2023-05-24 DIAGNOSIS — F419 Anxiety disorder, unspecified: Secondary | ICD-10-CM | POA: Diagnosis not present

## 2023-05-24 DIAGNOSIS — H6121 Impacted cerumen, right ear: Secondary | ICD-10-CM | POA: Diagnosis not present

## 2023-05-24 DIAGNOSIS — N1832 Chronic kidney disease, stage 3b: Secondary | ICD-10-CM

## 2023-05-24 DIAGNOSIS — R309 Painful micturition, unspecified: Secondary | ICD-10-CM

## 2023-05-24 DIAGNOSIS — F5105 Insomnia due to other mental disorder: Secondary | ICD-10-CM

## 2023-05-24 DIAGNOSIS — G2581 Restless legs syndrome: Secondary | ICD-10-CM

## 2023-05-24 DIAGNOSIS — M159 Polyosteoarthritis, unspecified: Secondary | ICD-10-CM

## 2023-05-24 DIAGNOSIS — I10 Essential (primary) hypertension: Secondary | ICD-10-CM | POA: Diagnosis not present

## 2023-05-24 DIAGNOSIS — R600 Localized edema: Secondary | ICD-10-CM | POA: Diagnosis not present

## 2023-05-24 NOTE — Progress Notes (Signed)
Location:  [facility] Room Number: 1/A Place of Service:  SNF [phone]) Provider:  Octavia Heir, NP   Mahlon Gammon, MD  Patient Care Team: Mahlon Gammon, MD as PCP - General (Internal Medicine) Frederica Kuster, MD as Attending Physician [facility] Medicine)  Extended Emergency Contact Information Primary Emergency Contact: [facility] Address: [phone]          [place] [address] [place] Home Phone: [phone] Mobile Phone: [phone] Relation: Son Secondary Emergency Contact: [facility] Address: [address]          [place], [place] [address] [place] Home Phone: [phone] Relation: Son  Code Status:  DNR Goals of care: Advanced Directive information    04/26/2023    9:57 AM  Advanced Directives  Does Patient Have a Medical Advance Directive? Yes  Type of Estate agent of [place];Living will;Out of facility DNR (pink MOST or yellow form)  Does patient want to make changes to medical advance directive? No - Patient declined  Copy of Healthcare Power of Attorney in Chart? Yes - validated most recent copy scanned in chart (See row information)     Chief Complaint  Patient presents with   Medical Management of Chronic Issues    HPI:  Pt is a 89 y.o. female seen today for medical management of chronic diseases.    She currently resides on the skilled nursing unit at [facility]. PMH: hypertension, PVD, osteoarthritis of multiple joints, CKD stage 3, posterior tendon dysfunction with right foot drop, constipation, restless leg syndrome and anxiety.     HTN-  BUN/creat 31/1.0 10/04/2022, remains on metoprolol CKD- GFR 53 (01/04)> 49 (03/2022) Painful micturition/ vaginal dryness- ongoing, unsuccessful trial of Uribel 06/2021 and Estrace (breast tenderness), past urine cultures unremarkable, refused urology consult, remains on pyridium prn, symptoms improved with good  hydration Insomnia- mirtazapine stopped 04/2023, 07/26 started on ativan 0.5 mg at bedtime prn  OA/chronic right knee pain-  h/o RTKA/ right foot drop, 12/2022 doppler negative for DVT> confirmed Bakers cyst, off norco, remains on tylenol  BLE- R>L, given metolazone/potassium twice weekly, unsuccessful trial of leg wrappings  RLS- remains on requip at bedtime  08/20 ophthalmology appointment> no new orders.  Recently celebrated 62 th birthday with family.   No recent falls or injuries.   Recent blood pressures:  08/20- 129/78  08/13- 140/90  08/06- 127/82  Recent weights:  08/06- 121.9 lbs  07/03- 122.6 lbs  06/01- 119.2 lbs           Past Medical History:  Diagnosis Date   Advanced care planning/counseling discussion 11/18/2018   Anemia    Closed compression fracture of L5 lumbar vertebra, sequela 09/08/2019   Hypertension    OA (osteoarthritis)    Obesity (BMI 30-39.9) 01/20/2018   Osteoporosis    Renal cyst    UTI (urinary tract infection) 11/28/2020   11/28/20 urine culture, Citrobacter, Keflex 500mg  tid x 7 day.    Weight gain 11/25/2018   Past Surgical History:  Procedure Laterality Date   APPENDECTOMY     BLADDER SUSPENSION     CATARACT EXTRACTION     CESAREAN SECTION     REPLACEMENT TOTAL KNEE  1995   TONSILLECTOMY      No Known Allergies  Outpatient Encounter Medications as of 05/24/2023  Medication Sig   acetaminophen (TYLENOL) 325 MG tablet Take 650 mg by mouth every 4 (four) hours as needed.  acetaminophen (TYLENOL) 325 MG tablet Take 650 mg by mouth 3 (three) times daily.   aspirin EC 81 MG tablet Take 81 mg by mouth 2 (two) times a week. Swallow whole.   Biotin 5000 MCG TABS Take 5,000 mcg by mouth daily.    bisacodyl (DULCOLAX) 10 MG suppository Place 10 mg rectally every other day. As needed   Cholecalciferol (VITAMIN D) 50 MCG (2000 UT) CAPS Take 2,000 Units by mouth daily.    Eyelid Cleansers (OCUSOFT EYELID CLEANSING) PADS Apply 1 Pad  topically every morning.   ketoconazole (NIZORAL) 2 % shampoo Apply 1 Application topically 2 (two) times a week. Tuesday & Friday.   LORazepam (ATIVAN) 0.5 MG tablet Take 1 tablet (0.5 mg total) by mouth at bedtime as needed for anxiety.   Magnesium Hydroxide (MILK OF MAGNESIA PO) Take 30 mLs by mouth daily as needed (for constipation.).   metolazone (ZAROXOLYN) 2.5 MG tablet Take 2.5 mg by mouth 2 (two) times a week. On Wed. And Sat. only   metoprolol tartrate (LOPRESSOR) 25 MG tablet Take 25 mg by mouth every morning.   metoprolol tartrate (LOPRESSOR) 50 MG tablet Take 50 mg by mouth every evening.   mirtazapine (REMERON) 7.5 MG tablet Take 7.5 mg by mouth every other day. Discontinue after 1 week   multivitamin-lutein (OCUVITE-LUTEIN) CAPS capsule Take 1 capsule by mouth 2 (two) times daily.    phenazopyridine (PYRIDIUM) 100 MG tablet Take 100 mg by mouth daily as needed for pain.   polyethylene glycol (MIRALAX / GLYCOLAX) 17 g packet Take 17 g by mouth daily.   polyvinyl alcohol (LIQUIFILM TEARS) 1.4 % ophthalmic solution Place 1 drop into both eyes 3 (three) times daily.   Potassium Chloride ER 20 MEQ TBCR Take 1 tablet by mouth 2 (two) times a week. On Wed. And Sat.   rOPINIRole (REQUIP) 1 MG tablet TAKE 1 TABLET EACH EVENING.   sennosides-docusate sodium (SENOKOT-S) 8.6-50 MG tablet Take 2 tablets by mouth daily. as needed for constipation at bedtime.   white petrolatum (VASELINE) ointment Apply 1 Application topically at bedtime. Apply to vaginal folds topically for dryness.   White Petrolatum-Mineral Oil (GENTEAL TEARS NIGHT-TIME) OINT Apply to eye as directed. Instill 0.25 inch in both eyes at bedtime for Dry eyes to conjunctival sac   zinc oxide 20 % ointment Apply 1 application topically as needed for irritation. Special Instructions: To buttocks after every incontinent episode and as needed for redness. May keep at bedside.   No facility-administered encounter medications on file as  of 05/24/2023.    Review of Systems  Constitutional:  Negative for activity change and appetite change.  HENT:  Positive for hearing loss. Negative for trouble swallowing.   Eyes:  Negative for visual disturbance.  Respiratory:  Negative for cough, shortness of breath and wheezing.   Cardiovascular:  Positive for leg swelling. Negative for chest pain.  Gastrointestinal:  Positive for constipation. Negative for abdominal distention and abdominal pain.  Genitourinary:  Positive for dysuria. Negative for frequency and hematuria.       Incontinence  Musculoskeletal:  Positive for arthralgias and gait problem.  Skin:  Negative for wound.  Neurological:  Positive for weakness. Negative for dizziness and headaches.  Psychiatric/Behavioral:  Positive for sleep disturbance. Negative for confusion and dysphoric mood. The patient is nervous/anxious.     Immunization History  Administered Date(s) Administered   Covid-19,MRNA Vaccine(Spikevax)30mos. thru 48yrs 08/07/2022   Fluad Quad(high Dose 65+) 07/25/2022   Influenza, High Dose Seasonal PF  07/10/2017, 07/15/2019, 07/05/2020, 07/25/2021   Influenza,inj,Quad PF,6+ Mos 07/03/2018   Influenza-Unspecified 07/04/2015, 07/10/2017   Moderna SARS-COV2 Booster Vaccination 03/08/2021, 03/14/2022   Moderna Sars-Covid-2 Vaccination 10/05/2019, 11/02/2019, 08/15/2020, 06/21/2021   PFIZER(Purple Top)SARS-COV-2 Vaccination 06/21/2021   Pneumococcal Conjugate-13 08/18/2014   Pneumococcal Polysaccharide-23 07/26/2010   Tdap 11/12/2018   Pertinent  Health Maintenance Due  Topic Date Due   INFLUENZA VACCINE  05/02/2023   DEXA SCAN  Discontinued      08/12/2019    2:51 PM 09/29/2021    1:43 PM 10/12/2022    9:50 AM 10/12/2022    2:29 PM 10/26/2022    9:07 AM  Fall Risk  Falls in the past year? 0 1 0 0 0  Was there an injury with Fall?  0 0 0 0  Fall Risk Category Calculator  1 0 0 0  Fall Risk Category (Retired)  Low Low Low   (RETIRED) Patient Fall  Risk Level   Low fall risk High fall risk   Patient at Risk for Falls Due to  History of fall(s);Impaired balance/gait;Orthopedic patient No Fall Risks History of fall(s);Impaired balance/gait;Impaired mobility History of fall(s);Impaired balance/gait;Impaired mobility  Fall risk Follow up  Falls evaluation completed;Education provided;Falls prevention discussed Falls evaluation completed Falls evaluation completed;Education provided;Falls prevention discussed Falls evaluation completed   Functional Status Survey:    Vitals:   05/24/23 1322  BP: 129/78  Resp: 20  Temp: (!) 97 F (36.1 C)  SpO2: 94%  Weight: 121 lb 14.4 oz (55.3 kg)  Height: 5' (1.524 m)   Body mass index is 23.81 kg/m. Physical Exam Vitals reviewed.  Constitutional:      General: She is not in acute distress. HENT:     Head: Normocephalic.     Right Ear: There is impacted cerumen.     Left Ear: There is no impacted cerumen.     Nose: Nose normal.     Mouth/Throat:     Mouth: Mucous membranes are moist.  Eyes:     General:        Right eye: No discharge.        Left eye: No discharge.  Cardiovascular:     Rate and Rhythm: Normal rate and regular rhythm.     Pulses: Normal pulses.     Heart sounds: Normal heart sounds.  Pulmonary:     Effort: Pulmonary effort is normal. No respiratory distress.     Breath sounds: Normal breath sounds. No wheezing.  Abdominal:     General: Bowel sounds are normal. There is no distension.     Palpations: Abdomen is soft.     Tenderness: There is no abdominal tenderness.  Musculoskeletal:     Cervical back: Neck supple.     Right lower leg: Edema present.     Left lower leg: Edema present.     Comments: 1+ pitting  Skin:    General: Skin is warm.     Capillary Refill: Capillary refill takes less than 2 seconds.  Neurological:     General: No focal deficit present.     Mental Status: She is alert. Mental status is at baseline.     Motor: Weakness present.     Gait:  Gait abnormal.     Comments: PWC  Psychiatric:        Mood and Affect: Mood normal.     Labs reviewed: Recent Labs    10/04/22 0940  NA 139  K 3.6  CL 101  CO2 30*  BUN 31*  CREATININE 1.0  CALCIUM 9.1   Recent Labs    10/04/22 0940  AST 15  ALT 9  ALKPHOS 55  ALBUMIN 3.4*   Recent Labs    10/04/22 0940 01/17/23 0000  WBC 7.3 7.8  NEUTROABS 4,256.00 3,767.00  HGB 12.1 11.2*  HCT 36 34*  PLT 199 346   Lab Results  Component Value Date   TSH 2.01 11/02/2021   No results found for: "HGBA1C" Lab Results  Component Value Date   CHOL 175 07/23/2019   HDL 54 07/23/2019   LDLCALC 105 (H) 07/23/2019   TRIG 74 07/23/2019   CHOLHDL 3.2 07/23/2019    Significant Diagnostic Results in last 30 days:  No results found.  Assessment/Plan 1. Impacted cerumen of right ear - start debrox 5 gtts BID x 5 days - flush right ear when Debrox complete  2. Essential hypertension - controlled - cont metoprolol  3. Stage 3b chronic kidney disease (HCC) - Avoid NSAIDS - encourage hydration with water  4. Painful micturition - ongoing - extensive workup negative including multiple urine cultures - encourage hydration with water - cont pyridium prn  5. Insomnia secondary to anxiety - 04/2023 off mirtazapine  - cont ativan 0.5 mg at bedtime prn  6. Primary osteoarthritis involving multiple joints - cont scheduled tylenol> will increase night dose x 3 days as trial   7. Bilateral leg edema - 1+ pitting - cont metolazone  8. RLS (restless legs syndrome) - cont Requip   Family/ staff Communication: plan discussed with patient and nurse  Labs/tests ordered:  cbc/diff, bmp, TSH 06/06/2023

## 2023-06-06 DIAGNOSIS — E039 Hypothyroidism, unspecified: Secondary | ICD-10-CM | POA: Diagnosis not present

## 2023-06-06 DIAGNOSIS — R197 Diarrhea, unspecified: Secondary | ICD-10-CM | POA: Diagnosis not present

## 2023-06-06 LAB — BASIC METABOLIC PANEL
BUN: 33 — AB (ref 4–21)
CO2: 21 (ref 13–22)
Chloride: 99 (ref 99–108)
Creatinine: 1.1 (ref 0.5–1.1)
Glucose: 71
Potassium: 4.3 mEq/L (ref 3.5–5.1)
Sodium: 138 (ref 137–147)

## 2023-06-06 LAB — CBC AND DIFFERENTIAL
HCT: 35 — AB (ref 36–46)
Hemoglobin: 12 (ref 12.0–16.0)
Neutrophils Absolute: 5082
Platelets: 240 10*3/uL (ref 150–400)
WBC: 8.4

## 2023-06-06 LAB — HEPATIC FUNCTION PANEL
ALT: 7 U/L (ref 7–35)
AST: 15 (ref 13–35)
Alkaline Phosphatase: 71 (ref 25–125)
Bilirubin, Total: 0.4

## 2023-06-06 LAB — COMPREHENSIVE METABOLIC PANEL
Albumin: 3.8 (ref 3.5–5.0)
Calcium: 9.7 (ref 8.7–10.7)
Globulin: 3.1
eGFR: 45

## 2023-06-06 LAB — CBC: RBC: 3.37 — AB (ref 3.87–5.11)

## 2023-06-06 LAB — TSH: TSH: 1.14 (ref 0.41–5.90)

## 2023-06-27 ENCOUNTER — Encounter: Payer: Self-pay | Admitting: Internal Medicine

## 2023-06-27 ENCOUNTER — Non-Acute Institutional Stay (SKILLED_NURSING_FACILITY): Payer: PPO | Admitting: Internal Medicine

## 2023-06-27 DIAGNOSIS — N1832 Chronic kidney disease, stage 3b: Secondary | ICD-10-CM | POA: Diagnosis not present

## 2023-06-27 DIAGNOSIS — I10 Essential (primary) hypertension: Secondary | ICD-10-CM | POA: Diagnosis not present

## 2023-06-27 DIAGNOSIS — F419 Anxiety disorder, unspecified: Secondary | ICD-10-CM | POA: Diagnosis not present

## 2023-06-27 DIAGNOSIS — M159 Polyosteoarthritis, unspecified: Secondary | ICD-10-CM | POA: Diagnosis not present

## 2023-06-27 DIAGNOSIS — F5105 Insomnia due to other mental disorder: Secondary | ICD-10-CM

## 2023-06-27 DIAGNOSIS — G2581 Restless legs syndrome: Secondary | ICD-10-CM

## 2023-06-27 DIAGNOSIS — R300 Dysuria: Secondary | ICD-10-CM

## 2023-06-27 DIAGNOSIS — R600 Localized edema: Secondary | ICD-10-CM

## 2023-06-27 NOTE — Progress Notes (Signed)
Location:  [facility] Room Number: N01A Place of Service:  SNF [phone]) Provider:  Mahlon Gammon, MD  Patient Care Team: Mahlon Gammon, MD as PCP - General (Internal Medicine) Frederica Kuster, MD as Attending Physician [facility] Medicine)  Extended Emergency Contact Information Primary Emergency Contact: [facility] Address: [phone]          [place] [address] [place] Home Phone: [phone] Mobile Phone: [phone] Relation: Son Secondary Emergency Contact: [facility] Address: [address]          [place], [place] [address] [place] Home Phone: [phone] Relation: Son  Code Status:  DNR Goals of care: Advanced Directive information    06/27/2023   11:15 AM  Advanced Directives  Does Patient Have a Medical Advance Directive? Yes  Type of Estate agent of [place];Out of facility DNR (pink MOST or yellow form)  Does patient want to make changes to medical advance directive? No - Patient declined  Copy of Healthcare Power of Attorney in Chart? Yes - validated most recent copy scanned in chart (See row information)     Chief Complaint  Patient presents with   Medical Management of Chronic Issues    Medical Management of Chronic Issues.     HPI:  Ann Jensen is a 89 y.o. female seen today for medical management of chronic diseases.     Lives iN SNF  Patient has h/o Hypertension, Anxiety, Restless leg Syndrome, Insomnia, Arthritis and Posterior Tendon Dysfunction with right foot Valgus in Right Knee with inability to Ambulate   Dysuria Continues to be her issue She said it is worse lately wants her Urine checked again Has Failed all kinds of treatment No Further work up or Urology visit as per Dr Hyacinth Meeker her POA  LE edema on Zaroxolyn Otherwise stable Wt Readings from Last 3 Encounters:  06/27/23 116 lb 6.4 oz (52.8 kg)  05/24/23 121 lb 14.4 oz (55.3 kg)  04/26/23 122 lb 4.8 oz  (55.5 kg)    Stopped her Remeron again Poor Appetite Doing well Cognitively Moves around in her power chair Needs Hoyer for Transfers  Past Medical History:  Diagnosis Date   Advanced care planning/counseling discussion 11/18/2018   Anemia    Closed compression fracture of L5 lumbar vertebra, sequela 09/08/2019   Hypertension    OA (osteoarthritis)    Obesity (BMI 30-39.9) 01/20/2018   Osteoporosis    Renal cyst    UTI (urinary tract infection) 11/28/2020   11/28/20 urine culture, Citrobacter, Keflex 500mg  tid x 7 day.    Weight gain 11/25/2018   Past Surgical History:  Procedure Laterality Date   APPENDECTOMY     BLADDER SUSPENSION     CATARACT EXTRACTION     CESAREAN SECTION     REPLACEMENT TOTAL KNEE  1995   TONSILLECTOMY      No Known Allergies  Outpatient Encounter Medications as of 06/27/2023  Medication Sig   acetaminophen (TYLENOL) 325 MG tablet Take 650 mg by mouth every 4 (four) hours as needed.   acetaminophen (TYLENOL) 325 MG tablet Take 650 mg by mouth 3 (three) times daily.   aspirin EC 81 MG tablet Take 81 mg by mouth 2 (two) times a week. Swallow whole.   Biotin 5000 MCG TABS Take 5,000 mcg by mouth daily.    bisacodyl (DULCOLAX) 10 MG suppository Place 10 mg rectally every other day. As needed   Cholecalciferol (VITAMIN D) 50 MCG (2000 UT)  CAPS Take 2,000 Units by mouth daily.    Eyelid Cleansers (OCUSOFT EYELID CLEANSING) PADS Apply 1 Pad topically every morning.   ketoconazole (NIZORAL) 2 % shampoo Apply 1 Application topically 2 (two) times a week. Tuesday & Friday.   Magnesium Hydroxide (MILK OF MAGNESIA PO) Take 30 mLs by mouth daily as needed (for constipation.).   metolazone (ZAROXOLYN) 2.5 MG tablet Take 2.5 mg by mouth 2 (two) times a week. On Wed. And Sat. only   metoprolol tartrate (LOPRESSOR) 25 MG tablet Take 25 mg by mouth every morning.   metoprolol tartrate (LOPRESSOR) 50 MG tablet Take 50 mg by mouth every evening.   multivitamin-lutein  (OCUVITE-LUTEIN) CAPS capsule Take 1 capsule by mouth 2 (two) times daily.    phenazopyridine (PYRIDIUM) 100 MG tablet Take 100 mg by mouth daily as needed for pain.   polyethylene glycol (MIRALAX / GLYCOLAX) 17 g packet Take 17 g by mouth. Every 3 days.   polyvinyl alcohol (LIQUIFILM TEARS) 1.4 % ophthalmic solution Place 1 drop into both eyes 3 (three) times daily.   Potassium Chloride ER 20 MEQ TBCR Take 1 tablet by mouth 2 (two) times a week. On Wed. And Sat.   rOPINIRole (REQUIP) 1 MG tablet TAKE 1 TABLET EACH EVENING.   sennosides-docusate sodium (SENOKOT-S) 8.6-50 MG tablet Take 2 tablets by mouth daily. as needed for constipation at bedtime.   white petrolatum (VASELINE) ointment Apply 1 Application topically at bedtime. Apply to vaginal folds topically for dryness.   White Petrolatum-Mineral Oil (GENTEAL TEARS NIGHT-TIME) OINT Apply to eye as directed. Instill 0.25 inch in both eyes at bedtime for Dry eyes to conjunctival sac   zinc oxide 20 % ointment Apply 1 application topically as needed for irritation. Special Instructions: To buttocks after every incontinent episode and as needed for redness. May keep at bedside.   [DISCONTINUED] LORazepam (ATIVAN) 0.5 MG tablet Take 1 tablet (0.5 mg total) by mouth at bedtime as needed for anxiety.   No facility-administered encounter medications on file as of 06/27/2023.    Review of Systems  Constitutional:  Negative for activity change and appetite change.  HENT: Negative.    Respiratory:  Negative for cough and shortness of breath.   Cardiovascular:  Positive for leg swelling.  Gastrointestinal:  Negative for constipation.  Genitourinary:  Positive for dysuria.  Musculoskeletal:  Positive for gait problem. Negative for arthralgias and myalgias.  Skin: Negative.   Neurological:  Negative for dizziness and weakness.  Psychiatric/Behavioral:  Negative for confusion, dysphoric mood and sleep disturbance.     Immunization History   Administered Date(s) Administered   Fluad Quad(high Dose 65+) 07/25/2022   Influenza, High Dose Seasonal PF 07/10/2017, 07/15/2019, 07/05/2020, 07/25/2021   Influenza,inj,Quad PF,6+ Mos 07/03/2018   Influenza-Unspecified 07/04/2015, 07/10/2017   Moderna Covid-19 Seasonal Vaccine 6 months thru 87years of age 02/04/2022   Moderna SARS-COV2 Booster Vaccination 03/08/2021, 03/14/2022   Moderna Sars-Covid-2 Vaccination 10/05/2019, 11/02/2019, 08/15/2020, 06/21/2021   PFIZER(Purple Top)SARS-COV-2 Vaccination 06/21/2021   Pneumococcal Conjugate-13 08/18/2014   Pneumococcal Polysaccharide-23 07/26/2010   RSV,unspecified 08/27/2022   Tdap 11/12/2018   Pertinent  Health Maintenance Due  Topic Date Due   INFLUENZA VACCINE  05/02/2023   DEXA SCAN  Discontinued      09/29/2021    1:43 PM 10/12/2022    9:50 AM 10/12/2022    2:29 PM 10/26/2022    9:07 AM 05/24/2023    3:50 PM  Fall Risk  Falls in the past year? 1 0 0 0  1  Was there an injury with Fall? 0 0 0 0 1  Fall Risk Category Calculator 1 0 0 0 2  Fall Risk Category (Retired) Low Low Low    (RETIRED) Patient Fall Risk Level  Low fall risk High fall risk    Patient at Risk for Falls Due to History of fall(s);Impaired balance/gait;Orthopedic patient No Fall Risks History of fall(s);Impaired balance/gait;Impaired mobility History of fall(s);Impaired balance/gait;Impaired mobility History of fall(s);Impaired balance/gait;Impaired mobility  Fall risk Follow up Falls evaluation completed;Education provided;Falls prevention discussed Falls evaluation completed Falls evaluation completed;Education provided;Falls prevention discussed Falls evaluation completed Falls evaluation completed;Education provided;Falls prevention discussed   Functional Status Survey:    Vitals:   06/27/23 1106  BP: 117/78  Pulse: 62  Resp: 13  Temp: (!) 97.4 F (36.3 C)  SpO2: 99%  Weight: 116 lb 6.4 oz (52.8 kg)  Height: 5' (1.524 m)   Body mass index is 22.73  kg/m. Physical Exam Vitals reviewed.  Constitutional:      Appearance: Normal appearance.  HENT:     Head: Normocephalic.     Nose: Nose normal.     Mouth/Throat:     Mouth: Mucous membranes are moist.     Pharynx: Oropharynx is clear.  Eyes:     Pupils: Pupils are equal, round, and reactive to light.  Cardiovascular:     Rate and Rhythm: Normal rate and regular rhythm.     Pulses: Normal pulses.     Heart sounds: Normal heart sounds. No murmur heard. Pulmonary:     Effort: Pulmonary effort is normal.     Breath sounds: Normal breath sounds.  Abdominal:     General: Abdomen is flat. Bowel sounds are normal.     Palpations: Abdomen is soft.  Musculoskeletal:        General: Swelling present.     Cervical back: Neck supple.  Skin:    General: Skin is warm.  Neurological:     Mental Status: She is alert and oriented to person, place, and time.  Psychiatric:        Mood and Affect: Mood normal.        Thought Content: Thought content normal.     Labs reviewed: Recent Labs    10/04/22 0940 06/06/23 0000  NA 139 138  K 3.6 4.3  CL 101 99  CO2 30* 21  BUN 31* 33*  CREATININE 1.0 1.1  CALCIUM 9.1 9.7   Recent Labs    10/04/22 0940 06/06/23 0000  AST 15 15  ALT 9 7  ALKPHOS 55 71  ALBUMIN 3.4* 3.8   Recent Labs    10/04/22 0940 01/17/23 0000 06/06/23 0000  WBC 7.3 7.8 8.4  NEUTROABS 4,256.00 3,767.00 5,082.00  HGB 12.1 11.2* 12.0  HCT 36 34* 35*  PLT 199 346 240   Lab Results  Component Value Date   TSH 1.14 06/06/2023   No results found for: "HGBA1C" Lab Results  Component Value Date   CHOL 175 07/23/2019   HDL 54 07/23/2019   LDLCALC 105 (H) 07/23/2019   TRIG 74 07/23/2019   CHOLHDL 3.2 07/23/2019    Significant Diagnostic Results in last 30 days:  No results found.  Assessment/Plan 1. Dysuria Check UA per her request  2. Essential hypertension Stable on Metoprolol  3. Stage 3b chronic kidney disease (HCC) Creat stable  4.  Insomnia secondary to anxiety Does not want anything  5. Primary osteoarthritis involving multiple joints Tylenol  6. Bilateral leg edema Low  Dose of Zaroxolyn  7. RLS (restless legs syndrome) Requip    Family/ staff Communication: UA and Culture  Labs/tests ordered:

## 2023-07-10 ENCOUNTER — Non-Acute Institutional Stay (SKILLED_NURSING_FACILITY): Payer: Self-pay | Admitting: Orthopedic Surgery

## 2023-07-10 ENCOUNTER — Encounter: Payer: Self-pay | Admitting: Orthopedic Surgery

## 2023-07-10 DIAGNOSIS — N183 Chronic kidney disease, stage 3 unspecified: Secondary | ICD-10-CM | POA: Diagnosis not present

## 2023-07-10 DIAGNOSIS — Z515 Encounter for palliative care: Secondary | ICD-10-CM | POA: Diagnosis not present

## 2023-07-10 DIAGNOSIS — N3020 Other chronic cystitis without hematuria: Secondary | ICD-10-CM

## 2023-07-10 DIAGNOSIS — R54 Age-related physical debility: Secondary | ICD-10-CM | POA: Diagnosis not present

## 2023-07-10 DIAGNOSIS — R32 Unspecified urinary incontinence: Secondary | ICD-10-CM

## 2023-07-10 DIAGNOSIS — R3989 Other symptoms and signs involving the genitourinary system: Secondary | ICD-10-CM | POA: Diagnosis not present

## 2023-07-10 MED ORDER — NITROFURANTOIN MACROCRYSTAL 50 MG PO CAPS: 50.0000 mg | ORAL_CAPSULE | Freq: Every day | ORAL | Status: DC

## 2023-07-10 NOTE — Progress Notes (Signed)
Location:  [facility] Room Number: 1/A Place of Service:  SNF [phone]) Provider:  Octavia Heir, NP   Mahlon Gammon, MD  Patient Care Team: Mahlon Gammon, MD as PCP - General (Internal Medicine) Frederica Kuster, MD as Attending Physician [facility])  Extended Emergency Contact Information Primary Emergency Contact: [facility] Address: [id number]          [place] [address] [place] Home Phone: [phone] Mobile Phone: [phone] Relation: Son Secondary Emergency Contact: [facility] Address: [address]          [place], [place] [address] [place] Home Phone: [phone] Relation: Son  Code Status:  DNR Goals of care: Advanced Directive information    06/27/2023   11:15 AM  Advanced Directives  Does Patient Have a Medical Advance Directive? Yes  Type of Estate agent of [place];Out of facility DNR (pink MOST or yellow form)  Does patient want to make changes to medical advance directive? No - Patient declined  Copy of Healthcare Power of Attorney in Chart? Yes - validated most recent copy scanned in chart (See row information)     Chief Complaint  Patient presents with   Acute Visit    dysuria    HPI:  Pt is a 89 y.o. female seen today for acute visit due to ongoing dysuria.   She currently resides on the skilled nursing unit at [facility]. PMH: hypertension, PVD, osteoarthritis of multiple joints, CKD stage 3, posterior tendon dysfunction with right foot drop, constipation, restless leg syndrome and anxiety.    H/o painful micturition and dysuria> 2 years. Unsuccessful trial Uribel and Estrace. Refused urology consult. Past urine cultures has been unremarkable. 09/26 UA/culture ordered by Dr. Chales Abrahams due to worsening dysuria. Urine culture revealed > 100,000 cfu/mL E.coli. She was started on Keflex 500 mg po TID x 7 days. She has since completed antibiotics and reports  improved symptoms. She is requesting low dose antibiotic for UTI prevention. Afebrile. Vitals stable.    Past Medical History:  Diagnosis Date   Advanced care planning/counseling discussion 11/18/2018   Anemia    Closed compression fracture of L5 lumbar vertebra, sequela 09/08/2019   Hypertension    OA (osteoarthritis)    Obesity (BMI 30-39.9) 01/20/2018   Osteoporosis    Renal cyst    UTI (urinary tract infection) 11/28/2020   11/28/20 urine culture, Citrobacter, Keflex 500mg  tid x 7 day.    Weight gain 11/25/2018   Past Surgical History:  Procedure Laterality Date   APPENDECTOMY     BLADDER SUSPENSION     CATARACT EXTRACTION     CESAREAN SECTION     REPLACEMENT TOTAL KNEE  1995   TONSILLECTOMY      No Known Allergies  Outpatient Encounter Medications as of 07/10/2023  Medication Sig   acetaminophen (TYLENOL) 325 MG tablet Take 650 mg by mouth every 4 (four) hours as needed.   acetaminophen (TYLENOL) 325 MG tablet Take 650 mg by mouth 3 (three) times daily.   aspirin EC 81 MG tablet Take 81 mg by mouth 2 (two) times a week. Swallow whole.   Biotin 5000 MCG TABS Take 5,000 mcg by mouth daily.    bisacodyl (DULCOLAX) 10 MG suppository Place 10 mg rectally every other day. As needed   Cholecalciferol (VITAMIN D) 50 MCG (2000 UT) CAPS Take 2,000 Units by mouth daily.    Eyelid Cleansers (OCUSOFT EYELID CLEANSING) PADS Apply 1  Pad topically every morning.   ketoconazole (NIZORAL) 2 % shampoo Apply 1 Application topically 2 (two) times a week. Tuesday & Friday.   Magnesium Hydroxide (MILK OF MAGNESIA PO) Take 30 mLs by mouth daily as needed (for constipation.).   metolazone (ZAROXOLYN) 2.5 MG tablet Take 2.5 mg by mouth 2 (two) times a week. On Wed. And Sat. only   metoprolol tartrate (LOPRESSOR) 25 MG tablet Take 25 mg by mouth every morning.   metoprolol tartrate (LOPRESSOR) 50 MG tablet Take 50 mg by mouth every evening.   multivitamin-lutein (OCUVITE-LUTEIN) CAPS capsule Take  1 capsule by mouth 2 (two) times daily.    phenazopyridine (PYRIDIUM) 100 MG tablet Take 100 mg by mouth daily as needed for pain.   polyethylene glycol (MIRALAX / GLYCOLAX) 17 g packet Take 17 g by mouth. Every 3 days.   polyvinyl alcohol (LIQUIFILM TEARS) 1.4 % ophthalmic solution Place 1 drop into both eyes 3 (three) times daily.   Potassium Chloride ER 20 MEQ TBCR Take 1 tablet by mouth 2 (two) times a week. On Wed. And Sat.   rOPINIRole (REQUIP) 1 MG tablet TAKE 1 TABLET EACH EVENING.   sennosides-docusate sodium (SENOKOT-S) 8.6-50 MG tablet Take 2 tablets by mouth daily. as needed for constipation at bedtime.   white petrolatum (VASELINE) ointment Apply 1 Application topically at bedtime. Apply to vaginal folds topically for dryness.   White Petrolatum-Mineral Oil (GENTEAL TEARS NIGHT-TIME) OINT Apply to eye as directed. Instill 0.25 inch in both eyes at bedtime for Dry eyes to conjunctival sac   zinc oxide 20 % ointment Apply 1 application topically as needed for irritation. Special Instructions: To buttocks after every incontinent episode and as needed for redness. May keep at bedside.   No facility-administered encounter medications on file as of 07/10/2023.    Review of Systems  Constitutional:  Negative for activity change and appetite change.  Respiratory:  Negative for cough and shortness of breath.   Cardiovascular:  Positive for leg swelling. Negative for chest pain.  Genitourinary:  Positive for dysuria. Negative for frequency, hematuria and vaginal bleeding.  Psychiatric/Behavioral:  Negative for confusion and dysphoric mood. The patient is not nervous/anxious.     Immunization History  Administered Date(s) Administered   Fluad Quad(high Dose 65+) 07/25/2022   Influenza, High Dose Seasonal PF 07/10/2017, 07/15/2019, 07/05/2020, 07/25/2021   Influenza,inj,Quad PF,6+ Mos 07/03/2018   Influenza-Unspecified 07/04/2015, 07/10/2017   Moderna Covid-19 Seasonal Vaccine 6 months  thru 89years of age 65/03/2022   Moderna SARS-COV2 Booster Vaccination 03/08/2021, 03/14/2022   Moderna Sars-Covid-2 Vaccination 10/05/2019, 11/02/2019, 08/15/2020, 06/21/2021   PFIZER(Purple Top)SARS-COV-2 Vaccination 06/21/2021   Pneumococcal Conjugate-13 08/18/2014   Pneumococcal Polysaccharide-23 07/26/2010   RSV,unspecified 08/27/2022   Tdap 11/12/2018   Pertinent  Health Maintenance Due  Topic Date Due   INFLUENZA VACCINE  05/02/2023   DEXA SCAN  Discontinued      09/29/2021    1:43 PM 10/12/2022    9:50 AM 10/12/2022    2:29 PM 10/26/2022    9:07 AM 05/24/2023    3:50 PM  Fall Risk  Falls in the past year? 1 0 0 0 1  Was there an injury with Fall? 0 0 0 0 1  Fall Risk Category Calculator 1 0 0 0 2  Fall Risk Category (Retired) Low Low Low    (RETIRED) Patient Fall Risk Level  Low fall risk High fall risk    Patient at Risk for Falls Due to History of fall(s);Impaired balance/gait;Orthopedic patient  No Fall Risks History of fall(s);Impaired balance/gait;Impaired mobility History of fall(s);Impaired balance/gait;Impaired mobility History of fall(s);Impaired balance/gait;Impaired mobility  Fall risk Follow up Falls evaluation completed;Education provided;Falls prevention discussed Falls evaluation completed Falls evaluation completed;Education provided;Falls prevention discussed Falls evaluation completed Falls evaluation completed;Education provided;Falls prevention discussed   Functional Status Survey:    Vitals:   07/10/23 1303  BP: 127/82  Pulse: 72  Resp: 18  Temp: (!) 97.2 F (36.2 C)  SpO2: 93%  Weight: 118 lb (53.5 kg)  Height: 5' (1.524 m)   Body mass index is 23.05 kg/m. Physical Exam Vitals reviewed.  Constitutional:      General: She is not in acute distress. HENT:     Head: Normocephalic.  Eyes:     General:        Right eye: No discharge.        Left eye: No discharge.  Cardiovascular:     Rate and Rhythm: Normal rate and regular rhythm.      Pulses: Normal pulses.     Heart sounds: Normal heart sounds.  Pulmonary:     Effort: Pulmonary effort is normal. No respiratory distress.     Breath sounds: Normal breath sounds. No wheezing or rales.  Abdominal:     General: Bowel sounds are normal.     Palpations: Abdomen is soft.  Musculoskeletal:     Cervical back: Neck supple.     Right lower leg: No edema.     Left lower leg: No edema.  Skin:    General: Skin is warm.     Capillary Refill: Capillary refill takes less than 2 seconds.  Neurological:     General: No focal deficit present.     Mental Status: She is alert and oriented to person, place, and time.     Motor: Weakness present.     Gait: Gait abnormal.     Comments: PWC  Psychiatric:        Mood and Affect: Mood normal.     Labs reviewed: Recent Labs    10/04/22 0940 06/06/23 0000  NA 139 138  K 3.6 4.3  CL 101 99  CO2 30* 21  BUN 31* 33*  CREATININE 1.0 1.1  CALCIUM 9.1 9.7   Recent Labs    10/04/22 0940 06/06/23 0000  AST 15 15  ALT 9 7  ALKPHOS 55 71  ALBUMIN 3.4* 3.8   Recent Labs    10/04/22 0940 01/17/23 0000 06/06/23 0000  WBC 7.3 7.8 8.4  NEUTROABS 4,256.00 3,767.00 5,082.00  HGB 12.1 11.2* 12.0  HCT 36 34* 35*  PLT 199 346 240   Lab Results  Component Value Date   TSH 1.14 06/06/2023   No results found for: "HGBA1C" Lab Results  Component Value Date   CHOL 175 07/23/2019   HDL 54 07/23/2019   LDLCALC 105 (H) 07/23/2019   TRIG 74 07/23/2019   CHOLHDL 3.2 07/23/2019    Significant Diagnostic Results in last 30 days:  No results found.  Assessment/Plan 1. Chronic cystitis - 09/26 urine culture > 100,000 cfu/mL E.coil> resolved with Keflex 500 mg TID x 7 days - dysuria decreased with antibiotics - high risk for infection due to bladder/bowel incontinence/ increased age - start low dose nitrofurantoin to help with prevention/symptoms  - nitrofurantoin (MACRODANTIN) 50 MG capsule; Take 1 capsule (50 mg total) by mouth  daily.  2. Urinary incontinence, unspecified type - see above  - cont skilled nursing     Family/ staff Communication: plan  discussed with patient and nurse  Labs/tests ordered:  none

## 2023-07-26 ENCOUNTER — Non-Acute Institutional Stay (SKILLED_NURSING_FACILITY): Payer: PPO | Admitting: Orthopedic Surgery

## 2023-07-26 ENCOUNTER — Encounter: Payer: Self-pay | Admitting: Orthopedic Surgery

## 2023-07-26 DIAGNOSIS — I10 Essential (primary) hypertension: Secondary | ICD-10-CM

## 2023-07-26 DIAGNOSIS — F419 Anxiety disorder, unspecified: Secondary | ICD-10-CM

## 2023-07-26 DIAGNOSIS — F5105 Insomnia due to other mental disorder: Secondary | ICD-10-CM | POA: Diagnosis not present

## 2023-07-26 DIAGNOSIS — R600 Localized edema: Secondary | ICD-10-CM

## 2023-07-26 DIAGNOSIS — H6121 Impacted cerumen, right ear: Secondary | ICD-10-CM | POA: Diagnosis not present

## 2023-07-26 DIAGNOSIS — N1832 Chronic kidney disease, stage 3b: Secondary | ICD-10-CM | POA: Diagnosis not present

## 2023-07-26 DIAGNOSIS — N3020 Other chronic cystitis without hematuria: Secondary | ICD-10-CM | POA: Diagnosis not present

## 2023-07-26 DIAGNOSIS — M150 Primary generalized (osteo)arthritis: Secondary | ICD-10-CM | POA: Diagnosis not present

## 2023-07-26 DIAGNOSIS — G2581 Restless legs syndrome: Secondary | ICD-10-CM | POA: Diagnosis not present

## 2023-07-26 MED ORDER — DEBROX 6.5 % OT SOLN
5.0000 [drp] | Freq: Two times a day (BID) | OTIC | Status: AC
Start: 2023-07-26 — End: 2023-07-31

## 2023-07-26 NOTE — Progress Notes (Signed)
Location:  [facility] Room Number: 1/A Place of Service:  SNF [phone]) Provider:  Octavia Heir, NP   Mahlon Gammon, MD  Patient Care Team: Mahlon Gammon, MD as PCP - General (Internal Medicine) Frederica Kuster, MD as Attending Physician [facility])  Extended Emergency Contact Information Primary Emergency Contact: [facility] Address: [phone]          [place] [address] [place] of [place] Home Phone: [phone] Mobile Phone: [phone] Relation: Son Secondary Emergency Contact: [facility] Address: [address]          [place], [place] [address] [place] Home Phone: [phone] Relation: Son  Code Status:  DNR Goals of care: Advanced Directive information    06/27/2023   11:15 AM  Advanced Directives  Does Patient Have a Medical Advance Directive? Yes  Type of Estate agent of [place];Out of facility DNR (pink MOST or yellow form)  Does patient want to make changes to medical advance directive? No - Patient declined  Copy of Healthcare Power of Attorney in Chart? Yes - validated most recent copy scanned in chart (See row information)     Chief Complaint  Patient presents with   Medical Management of Chronic Issues    HPI:  Pt is a 89 y.o. female seen today for medical management of chronic diseases.    She currently resides on the skilled nursing unit at [facility]. PMH: hypertension, PVD, osteoarthritis of multiple joints, CKD stage 3, posterior tendon dysfunction with right foot drop, constipation, restless leg syndrome and anxiety.     HTN-  BUN/creat 33/1.1 06/06/2023, remains on metoprolol CKD- GFR 53 (01/04)> 49 (03/2022) Chronic cystitis- 09/26 urine culture > 100,000 cfu/mL E.coli> resolved with Keflex x 7 days, 10/09 macrodantin started due to ongoing symptoms> she reports improvement today, remains on pyridium prn Insomnia- mirtazapine stopped 04/2023, failed GDR  of Ativan , remains on ativan at bedtime prn OA/chronic right knee pain-  h/o RTKA/ right foot drop, 12/2022 doppler negative for DVT> confirmed Bakers cyst, remains on tylenol  BLE- given metolazone/potassium twice weekly, unsuccessful trial of leg wrappings  RLS- remains on requip at bedtime  10/23 BIMS score 15/15.   No recent falls or injuries.   Recent weights:  10/07- 118 lbs  09/05- 116.4 lbs  08/06- 121.9 lbs  Recent blood pressures:  10/22- 122/75  10/15- 120/72  10/08- 127/82   Past Medical History:  Diagnosis Date   Advanced care planning/counseling discussion 11/18/2018   Anemia    Closed compression fracture of L5 lumbar vertebra, sequela 09/08/2019   Hypertension    OA (osteoarthritis)    Obesity (BMI 30-39.9) 01/20/2018   Osteoporosis    Renal cyst    UTI (urinary tract infection) 11/28/2020   11/28/20 urine culture, Citrobacter, Keflex 500mg  tid x 7 day.    Weight gain 11/25/2018   Past Surgical History:  Procedure Laterality Date   APPENDECTOMY     BLADDER SUSPENSION     CATARACT EXTRACTION     CESAREAN SECTION     REPLACEMENT TOTAL KNEE  1995   TONSILLECTOMY      No Known Allergies  Outpatient Encounter Medications as of 07/26/2023  Medication Sig   acetaminophen (TYLENOL) 325 MG tablet Take 650 mg by mouth every 4 (four) hours as needed.   acetaminophen (TYLENOL) 325 MG tablet Take 650 mg by mouth 3 (three) times daily.   aspirin EC 81 MG tablet Take  81 mg by mouth 2 (two) times a week. Swallow whole.   Biotin 5000 MCG TABS Take 5,000 mcg by mouth daily.    bisacodyl (DULCOLAX) 10 MG suppository Place 10 mg rectally every other day. As needed   Cholecalciferol (VITAMIN D) 50 MCG (2000 UT) CAPS Take 2,000 Units by mouth daily.    Eyelid Cleansers (OCUSOFT EYELID CLEANSING) PADS Apply 1 Pad topically every morning.   ketoconazole (NIZORAL) 2 % shampoo Apply 1 Application topically 2 (two) times a week. Tuesday & Friday.   Magnesium Hydroxide  (MILK OF MAGNESIA PO) Take 30 mLs by mouth daily as needed (for constipation.).   metolazone (ZAROXOLYN) 2.5 MG tablet Take 2.5 mg by mouth 2 (two) times a week. On Wed. And Sat. only   metoprolol tartrate (LOPRESSOR) 25 MG tablet Take 25 mg by mouth every morning.   metoprolol tartrate (LOPRESSOR) 50 MG tablet Take 50 mg by mouth every evening.   multivitamin-lutein (OCUVITE-LUTEIN) CAPS capsule Take 1 capsule by mouth 2 (two) times daily.    nitrofurantoin (MACRODANTIN) 50 MG capsule Take 1 capsule (50 mg total) by mouth daily.   phenazopyridine (PYRIDIUM) 100 MG tablet Take 100 mg by mouth daily as needed for pain.   polyethylene glycol (MIRALAX / GLYCOLAX) 17 g packet Take 17 g by mouth. Every 3 days.   polyvinyl alcohol (LIQUIFILM TEARS) 1.4 % ophthalmic solution Place 1 drop into both eyes 3 (three) times daily.   Potassium Chloride ER 20 MEQ TBCR Take 1 tablet by mouth 2 (two) times a week. On Wed. And Sat.   rOPINIRole (REQUIP) 1 MG tablet TAKE 1 TABLET EACH EVENING.   sennosides-docusate sodium (SENOKOT-S) 8.6-50 MG tablet Take 2 tablets by mouth daily. as needed for constipation at bedtime.   white petrolatum (VASELINE) ointment Apply 1 Application topically at bedtime. Apply to vaginal folds topically for dryness.   White Petrolatum-Mineral Oil (GENTEAL TEARS NIGHT-TIME) OINT Apply to eye as directed. Instill 0.25 inch in both eyes at bedtime for Dry eyes to conjunctival sac   zinc oxide 20 % ointment Apply 1 application topically as needed for irritation. Special Instructions: To buttocks after every incontinent episode and as needed for redness. May keep at bedside.   No facility-administered encounter medications on file as of 07/26/2023.    Review of Systems  Constitutional: Negative.   HENT: Negative.    Eyes: Negative.   Respiratory: Negative.    Cardiovascular:  Positive for leg swelling.  Gastrointestinal:  Positive for constipation.  Genitourinary:  Positive for dysuria.  Negative for frequency and hematuria.  Musculoskeletal:  Positive for arthralgias and gait problem.  Skin:  Negative for wound.  Neurological:  Positive for weakness. Negative for dizziness and headaches.  Psychiatric/Behavioral:  Positive for sleep disturbance. Negative for confusion and dysphoric mood. The patient is nervous/anxious.     Immunization History  Administered Date(s) Administered   Fluad Quad(high Dose 65+) 07/25/2022   Influenza, High Dose Seasonal PF 07/10/2017, 07/15/2019, 07/05/2020, 07/25/2021   Influenza,inj,Quad PF,6+ Mos 07/03/2018   Influenza-Unspecified 07/04/2015, 07/10/2017   Moderna Covid-19 Seasonal Vaccine 6 months thru 87years of age 11/07/2021   Moderna SARS-COV2 Booster Vaccination 03/08/2021, 03/14/2022   Moderna Sars-Covid-2 Vaccination 10/05/2019, 11/02/2019, 08/15/2020, 06/21/2021   PFIZER(Purple Top)SARS-COV-2 Vaccination 06/21/2021   Pneumococcal Conjugate-13 08/18/2014   Pneumococcal Polysaccharide-23 07/26/2010   RSV,unspecified 08/27/2022   Tdap 11/12/2018   Pertinent  Health Maintenance Due  Topic Date Due   INFLUENZA VACCINE  05/02/2023   DEXA SCAN  Discontinued  09/29/2021    1:43 PM 10/12/2022    9:50 AM 10/12/2022    2:29 PM 10/26/2022    9:07 AM 05/24/2023    3:50 PM  Fall Risk  Falls in the past year? 1 0 0 0 1  Was there an injury with Fall? 0 0 0 0 1  Fall Risk Category Calculator 1 0 0 0 2  Fall Risk Category (Retired) Low Low Low    (RETIRED) Patient Fall Risk Level  Low fall risk High fall risk    Patient at Risk for Falls Due to History of fall(s);Impaired balance/gait;Orthopedic patient No Fall Risks History of fall(s);Impaired balance/gait;Impaired mobility History of fall(s);Impaired balance/gait;Impaired mobility History of fall(s);Impaired balance/gait;Impaired mobility  Fall risk Follow up Falls evaluation completed;Education provided;Falls prevention discussed Falls evaluation completed Falls evaluation  completed;Education provided;Falls prevention discussed Falls evaluation completed Falls evaluation completed;Education provided;Falls prevention discussed   Functional Status Survey:    Vitals:   07/26/23 1304  BP: 122/75  Pulse: 68  Resp: 18  Temp: (!) 97.5 F (36.4 C)  SpO2: 96%  Weight: 118 lb (53.5 kg)  Height: 5' (1.524 m)   Body mass index is 23.05 kg/m. Physical Exam Vitals reviewed.  Constitutional:      General: She is not in acute distress. HENT:     Head: Normocephalic.     Right Ear: There is impacted cerumen.     Left Ear: There is no impacted cerumen.     Nose: Nose normal.     Mouth/Throat:     Mouth: Mucous membranes are moist.  Eyes:     General:        Right eye: No discharge.        Left eye: No discharge.  Cardiovascular:     Rate and Rhythm: Normal rate and regular rhythm.     Pulses: Normal pulses.     Heart sounds: Normal heart sounds.  Pulmonary:     Effort: Pulmonary effort is normal. No respiratory distress.     Breath sounds: Normal breath sounds. No wheezing.  Abdominal:     General: Bowel sounds are normal. There is no distension.     Palpations: Abdomen is soft.  Musculoskeletal:     Cervical back: Neck supple.     Right lower leg: Edema present.     Left lower leg: Edema present.     Comments: Non pitting, right foot drop  Skin:    General: Skin is warm.     Capillary Refill: Capillary refill takes less than 2 seconds.  Neurological:     General: No focal deficit present.     Mental Status: She is alert and oriented to person, place, and time.     Motor: Weakness present.     Gait: Gait abnormal.     Comments: [place], PWC  Psychiatric:        Mood and Affect: Mood normal.     Labs reviewed: Recent Labs    10/04/22 0940 06/06/23 0000  NA 139 138  K 3.6 4.3  CL 101 99  CO2 30* 21  BUN 31* 33*  CREATININE 1.0 1.1  CALCIUM 9.1 9.7   Recent Labs    10/04/22 0940 06/06/23 0000  AST 15 15  ALT 9 7  ALKPHOS 55 71   ALBUMIN 3.4* 3.8   Recent Labs    10/04/22 0940 01/17/23 0000 06/06/23 0000  WBC 7.3 7.8 8.4  NEUTROABS 4,256.00 3,767.00 5,082.00  HGB 12.1 11.2* 12.0  HCT  36 34* 35*  PLT 199 346 240   Lab Results  Component Value Date   TSH 1.14 06/06/2023   No results found for: "HGBA1C" Lab Results  Component Value Date   CHOL 175 07/23/2019   HDL 54 07/23/2019   LDLCALC 105 (H) 07/23/2019   TRIG 74 07/23/2019   CHOLHDL 3.2 07/23/2019    Significant Diagnostic Results in last 30 days:  No results found.  Assessment/Plan 1. Impacted cerumen of right ear - flush right ear when Debrox complete - carbamide peroxide (DEBROX) 6.5 % OTIC solution; Place 5 drops into the right ear 2 (two) times daily for 5 days.  2. Essential hypertension - controlled - cont metoprolol  3. Stage 3b chronic kidney disease (HCC) - encourage hydration with water - avoid NSAIDS  4. Chronic cystitis - ongoing - recent urine culture E.coli - 10/09 macrodantin started due to ongoing dysuria> she reports some improvement - cont pyridium prn  5. Insomnia secondary to anxiety - failed ativan GDR - off Remeron per family request 04/2023 - cont ativan at bedtime prn  6. Primary osteoarthritis involving multiple joints - ongoing - cont scheduled tylenol  7. Bilateral leg edema - non pitting edema - sedentary lifestyle - cont metolazone 2x/week  8. RLS (restless legs syndrome) - stable with Requip    Family/ staff Communication: plan discussed with patient and nurse  Labs/tests ordered:  none

## 2023-07-29 DIAGNOSIS — R3989 Other symptoms and signs involving the genitourinary system: Secondary | ICD-10-CM | POA: Diagnosis not present

## 2023-07-29 DIAGNOSIS — R54 Age-related physical debility: Secondary | ICD-10-CM | POA: Diagnosis not present

## 2023-07-29 DIAGNOSIS — Z515 Encounter for palliative care: Secondary | ICD-10-CM | POA: Diagnosis not present

## 2023-07-29 DIAGNOSIS — N183 Chronic kidney disease, stage 3 unspecified: Secondary | ICD-10-CM | POA: Diagnosis not present

## 2023-08-02 DIAGNOSIS — M79672 Pain in left foot: Secondary | ICD-10-CM | POA: Diagnosis not present

## 2023-08-02 DIAGNOSIS — B351 Tinea unguium: Secondary | ICD-10-CM | POA: Diagnosis not present

## 2023-08-02 DIAGNOSIS — M79671 Pain in right foot: Secondary | ICD-10-CM | POA: Diagnosis not present

## 2023-08-23 ENCOUNTER — Encounter: Payer: Self-pay | Admitting: Orthopedic Surgery

## 2023-08-23 ENCOUNTER — Non-Acute Institutional Stay (SKILLED_NURSING_FACILITY): Payer: PPO | Admitting: Orthopedic Surgery

## 2023-08-23 DIAGNOSIS — I10 Essential (primary) hypertension: Secondary | ICD-10-CM | POA: Diagnosis not present

## 2023-08-23 DIAGNOSIS — N3020 Other chronic cystitis without hematuria: Secondary | ICD-10-CM

## 2023-08-23 DIAGNOSIS — G2581 Restless legs syndrome: Secondary | ICD-10-CM | POA: Diagnosis not present

## 2023-08-23 DIAGNOSIS — R600 Localized edema: Secondary | ICD-10-CM

## 2023-08-23 DIAGNOSIS — M150 Primary generalized (osteo)arthritis: Secondary | ICD-10-CM

## 2023-08-23 DIAGNOSIS — K5901 Slow transit constipation: Secondary | ICD-10-CM

## 2023-08-23 DIAGNOSIS — N1831 Chronic kidney disease, stage 3a: Secondary | ICD-10-CM

## 2023-08-23 DIAGNOSIS — R2681 Unsteadiness on feet: Secondary | ICD-10-CM

## 2023-08-23 DIAGNOSIS — R413 Other amnesia: Secondary | ICD-10-CM

## 2023-08-23 NOTE — Progress Notes (Signed)
Location:  [facility] Room Number: 1/A Place of Service:  SNF [phone]) Provider:  Octavia Heir, NP   Mahlon Gammon, MD  Patient Care Team: Mahlon Gammon, MD as PCP - General (Internal Medicine) Frederica Kuster, MD as Attending Physician [facility] Medicine)  Extended Emergency Contact Information Primary Emergency Contact: [facility] Address: [phone]          [place] [address] [place] Home Phone: [phone] Mobile Phone: [phone] Relation: Son Secondary Emergency Contact: [facility] Address: [address]          [place], [place] [address] [place] Home Phone: [phone] Relation: Son  Code Status:  DNR Goals of care: Advanced Directive information    06/27/2023   11:15 AM  Advanced Directives  Does Patient Have a Medical Advance Directive? Yes  Type of Estate agent of [place];Out of facility DNR (pink MOST or yellow form)  Does patient want to make changes to medical advance directive? No - Patient declined  Copy of Healthcare Power of Attorney in Chart? Yes - validated most recent copy scanned in chart (See row information)     Chief Complaint  Patient presents with   Medical Management of Chronic Issues    HPI:  Pt is a 89 y.o. female seen today for medical management of chronic diseases.    She currently resides on the skilled nursing unit at [facility] PMH: hypertension, PVD, osteoarthritis of multiple joints, CKD stage 3, posterior tendon dysfunction with right foot drop, constipation, restless leg syndrome and anxiety.     Memory impairment- recent BIMS 15/15 (10/23), more forgetful, did not remember fall earlier this year, no behaviors HTN-  BUN/creat 33/1.1 06/06/2023, remains on metoprolol CKD- GFR 53 (01/04)> 49 (03/2022) Chronic cystitis- 09/26 urine culture > 100,000 cfu/mL E.coli> resolved with Keflex x 7 days, remains on nitrofurantoin and  pyridium  prn OA/chronic right knee pain-  h/o RTKA/ right foot drop, 12/2022 doppler negative for DVT> confirmed Bakers cyst, remains on tylenol Unstable gait- hoyer transfer, does not ambulate  BLE- given metolazone/potassium twice weekly, unsuccessful trial of leg wrappings  RLS- remains on requip at bedtime Constipation- remains on miralax prn> last dose given 11/05 er chart review  Recent blood pressures:  11/19- 120/76  11/12- 131/76  11/05- 112/73  Recent weights:  11/01- 118.7 lbs  10/04- 118 lbs  09/05- 116.4 lbs         Past Medical History:  Diagnosis Date   Advanced care planning/counseling discussion 11/18/2018   Anemia    Closed compression fracture of L5 lumbar vertebra, sequela 09/08/2019   Hypertension    OA (osteoarthritis)    Obesity (BMI 30-39.9) 01/20/2018   Osteoporosis    Renal cyst    UTI (urinary tract infection) 11/28/2020   11/28/20 urine culture, Citrobacter, Keflex 500mg  tid x 7 day.    Weight gain 11/25/2018   Past Surgical History:  Procedure Laterality Date   APPENDECTOMY     BLADDER SUSPENSION     CATARACT EXTRACTION     CESAREAN SECTION     REPLACEMENT TOTAL KNEE  1995   TONSILLECTOMY      No Known Allergies  Outpatient Encounter Medications as of 08/23/2023  Medication Sig   acetaminophen (TYLENOL) 325 MG tablet Take 650 mg by mouth every 4 (four) hours as needed.   acetaminophen (TYLENOL) 325 MG tablet Take 650 mg by mouth 3 (three) times daily.  aspirin EC 81 MG tablet Take 81 mg by mouth 2 (two) times a week. Swallow whole.   Biotin 5000 MCG TABS Take 5,000 mcg by mouth daily.    bisacodyl (DULCOLAX) 10 MG suppository Place 10 mg rectally every other day. As needed   Cholecalciferol (VITAMIN D) 50 MCG (2000 UT) CAPS Take 2,000 Units by mouth daily.    Eyelid Cleansers (OCUSOFT EYELID CLEANSING) PADS Apply 1 Pad topically every morning.   ketoconazole (NIZORAL) 2 % shampoo Apply 1 Application topically 2 (two) times a week. Tuesday &  Friday.   LORazepam (ATIVAN) 0.5 MG tablet Take 0.5 mg by mouth at bedtime as needed for anxiety.   Magnesium Hydroxide (MILK OF MAGNESIA PO) Take 30 mLs by mouth daily as needed (for constipation.).   metolazone (ZAROXOLYN) 2.5 MG tablet Take 2.5 mg by mouth 2 (two) times a week. On Wed. And Sat. only   metoprolol tartrate (LOPRESSOR) 25 MG tablet Take 25 mg by mouth every morning.   metoprolol tartrate (LOPRESSOR) 50 MG tablet Take 50 mg by mouth every evening.   multivitamin-lutein (OCUVITE-LUTEIN) CAPS capsule Take 1 capsule by mouth 2 (two) times daily.    nitrofurantoin (MACRODANTIN) 50 MG capsule Take 1 capsule (50 mg total) by mouth daily.   phenazopyridine (PYRIDIUM) 100 MG tablet Take 100 mg by mouth daily as needed for pain.   polyethylene glycol (MIRALAX / GLYCOLAX) 17 g packet Take 17 g by mouth. Every 3 days.   polyvinyl alcohol (LIQUIFILM TEARS) 1.4 % ophthalmic solution Place 1 drop into both eyes 3 (three) times daily.   Potassium Chloride ER 20 MEQ TBCR Take 1 tablet by mouth 2 (two) times a week. On Wed. And Sat.   rOPINIRole (REQUIP) 1 MG tablet TAKE 1 TABLET EACH EVENING.   sennosides-docusate sodium (SENOKOT-S) 8.6-50 MG tablet Take 2 tablets by mouth daily. as needed for constipation at bedtime.   white petrolatum (VASELINE) ointment Apply 1 Application topically at bedtime. Apply to vaginal folds topically for dryness.   White Petrolatum-Mineral Oil (GENTEAL TEARS NIGHT-TIME) OINT Apply to eye as directed. Instill 0.25 inch in both eyes at bedtime for Dry eyes to conjunctival sac   zinc oxide 20 % ointment Apply 1 application topically as needed for irritation. Special Instructions: To buttocks after every incontinent episode and as needed for redness. May keep at bedside.   No facility-administered encounter medications on file as of 08/23/2023.    Review of Systems  Constitutional:  Negative for activity change and appetite change.  HENT:  Positive for hearing loss.  Negative for trouble swallowing.   Eyes:  Negative for visual disturbance.  Respiratory:  Negative for cough, shortness of breath and wheezing.   Cardiovascular:  Positive for leg swelling. Negative for chest pain.  Gastrointestinal:  Positive for constipation and diarrhea. Negative for abdominal distention and abdominal pain.  Genitourinary:  Positive for dysuria. Negative for frequency, hematuria and vaginal bleeding.  Musculoskeletal:  Positive for arthralgias and gait problem.  Skin:  Negative for wound.  Neurological:  Positive for weakness. Negative for dizziness and headaches.  Psychiatric/Behavioral:  Positive for confusion. Negative for dysphoric mood and sleep disturbance. The patient is not nervous/anxious.     Immunization History  Administered Date(s) Administered   Fluad Quad(high Dose 65+) 07/25/2022   Influenza, High Dose Seasonal PF 07/10/2017, 07/15/2019, 07/05/2020, 07/25/2021   Influenza,inj,Quad PF,6+ Mos 07/03/2018   Influenza-Unspecified 07/04/2015, 07/10/2017   Moderna Covid-19 Seasonal Vaccine 6 months thru 87years of age 98/03/2022  Moderna SARS-COV2 Booster Vaccination 03/08/2021, 03/14/2022   Moderna Sars-Covid-2 Vaccination 10/05/2019, 11/02/2019, 08/15/2020, 06/21/2021   PFIZER(Purple Top)SARS-COV-2 Vaccination 06/21/2021   Pneumococcal Conjugate-13 08/18/2014   Pneumococcal Polysaccharide-23 07/26/2010   RSV,unspecified 08/27/2022   Tdap 11/12/2018   Pertinent  Health Maintenance Due  Topic Date Due   INFLUENZA VACCINE  05/02/2023   DEXA SCAN  Discontinued      10/12/2022    9:50 AM 10/12/2022    2:29 PM 10/26/2022    9:07 AM 05/24/2023    3:50 PM 07/26/2023    1:20 PM  Fall Risk  Falls in the past year? 0 0 0 1 1  Was there an injury with Fall? 0 0 0 1 1  Fall Risk Category Calculator 0 0 0 2 2  Fall Risk Category (Retired) Low Low     (RETIRED) Patient Fall Risk Level Low fall risk High fall risk     Patient at Risk for Falls Due to No Fall  Risks History of fall(s);Impaired balance/gait;Impaired mobility History of fall(s);Impaired balance/gait;Impaired mobility History of fall(s);Impaired balance/gait;Impaired mobility History of fall(s);Impaired balance/gait;Impaired mobility  Fall risk Follow up Falls evaluation completed Falls evaluation completed;Education provided;Falls prevention discussed Falls evaluation completed Falls evaluation completed;Education provided;Falls prevention discussed Falls evaluation completed;Education provided;Falls prevention discussed   Functional Status Survey:    Vitals:   08/23/23 1208  BP: 120/76  Pulse: 70  Resp: 18  Temp: (!) 97.4 F (36.3 C)  SpO2: 98%  Weight: 118 lb 11.2 oz (53.8 kg)  Height: 5' (1.524 m)   Body mass index is 23.18 kg/m. Physical Exam Vitals reviewed.  Constitutional:      General: She is not in acute distress. HENT:     Head: Normocephalic.     Right Ear: There is no impacted cerumen.     Left Ear: There is no impacted cerumen.     Nose: Nose normal.     Mouth/Throat:     Mouth: Mucous membranes are moist.  Eyes:     General:        Right eye: No discharge.        Left eye: No discharge.  Cardiovascular:     Rate and Rhythm: Normal rate and regular rhythm.     Pulses: Normal pulses.     Heart sounds: Murmur heard.  Pulmonary:     Effort: Pulmonary effort is normal.     Breath sounds: Normal breath sounds.  Abdominal:     General: Bowel sounds are normal.     Palpations: Abdomen is soft.  Musculoskeletal:     Cervical back: Neck supple.     Right lower leg: Edema present.     Left lower leg: Edema present.     Comments: Non pitting, right foot drop  Skin:    General: Skin is warm.     Capillary Refill: Capillary refill takes less than 2 seconds.  Neurological:     General: No focal deficit present.     Mental Status: She is alert. Mental status is at baseline.     Motor: Weakness present.     Gait: Gait abnormal.     Comments: PWC   Psychiatric:        Mood and Affect: Mood normal.     Comments: Alert to self/person/place/time, forgetful of events within past year, follows commands     Labs reviewed: Recent Labs    10/04/22 0940 06/06/23 0000  NA 139 138  K 3.6 4.3  CL 101 99  CO2 30* 21  BUN 31* 33*  CREATININE 1.0 1.1  CALCIUM 9.1 9.7   Recent Labs    10/04/22 0940 06/06/23 0000  AST 15 15  ALT 9 7  ALKPHOS 55 71  ALBUMIN 3.4* 3.8   Recent Labs    10/04/22 0940 01/17/23 0000 06/06/23 0000  WBC 7.3 7.8 8.4  NEUTROABS 4,256.00 3,767.00 5,082.00  HGB 12.1 11.2* 12.0  HCT 36 34* 35*  PLT 199 346 240   Lab Results  Component Value Date   TSH 1.14 06/06/2023   No results found for: "HGBA1C" Lab Results  Component Value Date   CHOL 175 07/23/2019   HDL 54 07/23/2019   LDLCALC 105 (H) 07/23/2019   TRIG 74 07/23/2019   CHOLHDL 3.2 07/23/2019    Significant Diagnostic Results in last 30 days:  No results found.  Assessment/Plan 1. Memory impairment - MMSE 26/30 2019 - no behaviors - forgetting events that occurred within past year   2. Essential hypertension - controlled - cont metoprolol  3. Stage 3a chronic kidney disease (HCC) - encourage hydration with water - avoid NSAIDS  4. Chronic cystitis - ongoing - last urine culture + E.coli - now on nitrofurantoin - encourage hydration with water - cont pyridium prn  5. Primary osteoarthritis involving multiple joints -ongoing - cont scheduled tylenol  6. Unstable gait - hoyer transfer, ambulates with PWC - cont skilled nursing care  7. Bilateral leg edema - ongoing - cont metolazone   8. RLS (restless legs syndrome) - stable with Requip  9. Slow transit constipation - abdomen soft -cont miralax prn   Family/ staff Communication: plan discussed with patient and nurse  Labs/tests ordered:  none

## 2023-08-26 DIAGNOSIS — R3989 Other symptoms and signs involving the genitourinary system: Secondary | ICD-10-CM | POA: Diagnosis not present

## 2023-08-26 DIAGNOSIS — N183 Chronic kidney disease, stage 3 unspecified: Secondary | ICD-10-CM | POA: Diagnosis not present

## 2023-08-26 DIAGNOSIS — R54 Age-related physical debility: Secondary | ICD-10-CM | POA: Diagnosis not present

## 2023-08-26 DIAGNOSIS — Z515 Encounter for palliative care: Secondary | ICD-10-CM | POA: Diagnosis not present

## 2023-09-20 ENCOUNTER — Non-Acute Institutional Stay (SKILLED_NURSING_FACILITY): Payer: Self-pay | Admitting: Orthopedic Surgery

## 2023-09-20 ENCOUNTER — Encounter: Payer: Self-pay | Admitting: Orthopedic Surgery

## 2023-09-20 DIAGNOSIS — N3020 Other chronic cystitis without hematuria: Secondary | ICD-10-CM | POA: Diagnosis not present

## 2023-09-20 DIAGNOSIS — G2581 Restless legs syndrome: Secondary | ICD-10-CM

## 2023-09-20 DIAGNOSIS — R634 Abnormal weight loss: Secondary | ICD-10-CM

## 2023-09-20 DIAGNOSIS — I10 Essential (primary) hypertension: Secondary | ICD-10-CM

## 2023-09-20 DIAGNOSIS — N1831 Chronic kidney disease, stage 3a: Secondary | ICD-10-CM | POA: Diagnosis not present

## 2023-09-20 DIAGNOSIS — R600 Localized edema: Secondary | ICD-10-CM | POA: Diagnosis not present

## 2023-09-20 DIAGNOSIS — R413 Other amnesia: Secondary | ICD-10-CM | POA: Diagnosis not present

## 2023-09-20 DIAGNOSIS — H353133 Nonexudative age-related macular degeneration, bilateral, advanced atrophic without subfoveal involvement: Secondary | ICD-10-CM

## 2023-09-20 DIAGNOSIS — M150 Primary generalized (osteo)arthritis: Secondary | ICD-10-CM

## 2023-09-20 NOTE — Progress Notes (Signed)
Location:  [facility] Room Number: 1/A Place of Service:  SNF [phone]) Provider:  Octavia Heir, NP   Mahlon Gammon, MD  Patient Care Team: Mahlon Gammon, MD as PCP - General (Internal Medicine) Frederica Kuster, MD as Attending Physician [facility] Medicine)  Extended Emergency Contact Information Primary Emergency Contact: [facility] Address: [phone]          [place] [id number] [place] of [place] Home Phone: [phone] Mobile Phone: [phone] Relation: Son Secondary Emergency Contact: [facility] Address: [address]          [place], [place] [address] [place] Home Phone: [phone] Relation: Son  Code Status:  DNR Goals of care: Advanced Directive information    06/27/2023   11:15 AM  Advanced Directives  Does Patient Have a Medical Advance Directive? Yes  Type of Estate agent of [place];Out of facility DNR (pink MOST or yellow form)  Does patient want to make changes to medical advance directive? No - Patient declined  Copy of Healthcare Power of Attorney in Chart? Yes - validated most recent copy scanned in chart (See row information)     Chief Complaint  Patient presents with   Medical Management of Chronic Issues    HPI:  Pt is a 89 y.o. female seen today for medical management of chronic diseases.    She currently resides on the skilled nursing unit at [facility]. PMH: hypertension, PVD, osteoarthritis of multiple joints, CKD stage 3, posterior tendon dysfunction with right foot drop, constipation, restless leg syndrome and anxiety.    Chronic cystitis- 09/26 urine culture > 100,000 cfu/mL E.coli> resolved with Keflex x 7 days, improved symptoms with increased hydration with water, remains on nitrofurantoin and pyridium prn  Memory impairment- recent BIMS 15/15 (10/23), more forgetful at times, also HOH> followed by AIM HTN-  BUN/creat 33/1.1 06/06/2023, remains on  metoprolol CKD- GFR 45 (09/05)> was 53 (01/04)> 49 (03/2022) OA/chronic right knee pain-  h/o RTKA/ right foot drop, 12/2022 doppler negative for DVT> confirmed Bakers cyst, remains on tylenol Unstable gait- hoyer transfer, does not ambulate  BLE- given metolazone/potassium twice weekly, unsuccessful trial of leg wrappings  RLS- remains on requip at bedtime Macular degeneration- followed by Dr. Cathey Endow, more trouble seeing, family brought her large magnifying glass which has helped  No recent falls or injuries.   Recent weights:  12/02- 114 lbs  11/06- 118.7 lbs  10/04- 118 lbs   Recent blood pressures:  12/17- 128/83  12/10- 112/76  12/03- 121/80   Past Medical History:  Diagnosis Date   Advanced care planning/counseling discussion 11/18/2018   Anemia    Closed compression fracture of L5 lumbar vertebra, sequela 09/08/2019   Hypertension    OA (osteoarthritis)    Obesity (BMI 30-39.9) 01/20/2018   Osteoporosis    Renal cyst    UTI (urinary tract infection) 11/28/2020   11/28/20 urine culture, Citrobacter, Keflex 500mg  tid x 7 day.    Weight gain 11/25/2018   Past Surgical History:  Procedure Laterality Date   APPENDECTOMY     BLADDER SUSPENSION     CATARACT EXTRACTION     CESAREAN SECTION     REPLACEMENT TOTAL KNEE  1995   TONSILLECTOMY      No Known Allergies  Outpatient Encounter Medications as of 09/20/2023  Medication Sig   acetaminophen (TYLENOL) 325 MG tablet Take 650 mg by mouth every 4 (four) hours as needed.   acetaminophen (  TYLENOL) 325 MG tablet Take 650 mg by mouth 3 (three) times daily.   aspirin EC 81 MG tablet Take 81 mg by mouth 2 (two) times a week. Swallow whole.   Biotin 5000 MCG TABS Take 5,000 mcg by mouth daily.    bisacodyl (DULCOLAX) 10 MG suppository Place 10 mg rectally every other day. As needed   Cholecalciferol (VITAMIN D) 50 MCG (2000 UT) CAPS Take 2,000 Units by mouth daily.    Eyelid Cleansers (OCUSOFT EYELID CLEANSING) PADS Apply 1  Pad topically every morning.   ketoconazole (NIZORAL) 2 % shampoo Apply 1 Application topically 2 (two) times a week. Tuesday & Friday.   Magnesium Hydroxide (MILK OF MAGNESIA PO) Take 30 mLs by mouth daily as needed (for constipation.).   metolazone (ZAROXOLYN) 2.5 MG tablet Take 2.5 mg by mouth 2 (two) times a week. On Wed. And Sat. only   metoprolol tartrate (LOPRESSOR) 25 MG tablet Take 25 mg by mouth every morning.   metoprolol tartrate (LOPRESSOR) 50 MG tablet Take 50 mg by mouth every evening.   multivitamin-lutein (OCUVITE-LUTEIN) CAPS capsule Take 1 capsule by mouth 2 (two) times daily.    nitrofurantoin (MACRODANTIN) 50 MG capsule Take 1 capsule (50 mg total) by mouth daily.   phenazopyridine (PYRIDIUM) 100 MG tablet Take 100 mg by mouth daily as needed for pain.   polyethylene glycol (MIRALAX / GLYCOLAX) 17 g packet Take 17 g by mouth. Every 3 days.   polyvinyl alcohol (LIQUIFILM TEARS) 1.4 % ophthalmic solution Place 1 drop into both eyes 3 (three) times daily.   Potassium Chloride ER 20 MEQ TBCR Take 1 tablet by mouth 2 (two) times a week. On Wed. And Sat.   rOPINIRole (REQUIP) 1 MG tablet TAKE 1 TABLET EACH EVENING.   sennosides-docusate sodium (SENOKOT-S) 8.6-50 MG tablet Take 2 tablets by mouth daily. as needed for constipation at bedtime.   white petrolatum (VASELINE) ointment Apply 1 Application topically at bedtime. Apply to vaginal folds topically for dryness.   White Petrolatum-Mineral Oil (GENTEAL TEARS NIGHT-TIME) OINT Apply to eye as directed. Instill 0.25 inch in both eyes at bedtime for Dry eyes to conjunctival sac   zinc oxide 20 % ointment Apply 1 application topically as needed for irritation. Special Instructions: To buttocks after every incontinent episode and as needed for redness. May keep at bedside.   No facility-administered encounter medications on file as of 09/20/2023.    Review of Systems  Constitutional:  Negative for activity change and appetite change.   HENT:  Positive for hearing loss. Negative for trouble swallowing.   Eyes:  Negative for visual disturbance.  Respiratory:  Negative for cough, shortness of breath and wheezing.   Cardiovascular:  Positive for leg swelling. Negative for chest pain.  Gastrointestinal:  Positive for constipation. Negative for abdominal distention and abdominal pain.  Genitourinary:  Positive for dysuria. Negative for frequency and hematuria.  Musculoskeletal:  Positive for arthralgias and gait problem.  Neurological:  Positive for weakness. Negative for dizziness and light-headedness.  Psychiatric/Behavioral:  Positive for confusion. Negative for dysphoric mood and sleep disturbance. The patient is not nervous/anxious.     Immunization History  Administered Date(s) Administered   Fluad Quad(high Dose 65+) 07/25/2022, 07/31/2023   Influenza, High Dose Seasonal PF 07/10/2017, 07/15/2019, 07/05/2020, 07/25/2021   Influenza,inj,Quad PF,6+ Mos 07/03/2018   Influenza-Unspecified 07/04/2015, 07/10/2017   Moderna Covid-19 Seasonal Vaccine 6 months thru 87years of age 63/03/2022   [employer] Covid-19 Vaccine Bivalent Booster 63yrs & up 08/07/2023  Moderna SARS-COV2 Booster Vaccination 03/08/2021, 03/14/2022   Moderna Sars-Covid-2 Vaccination 10/05/2019, 11/02/2019, 08/15/2020, 06/21/2021   PFIZER(Purple Top)SARS-COV-2 Vaccination 06/21/2021   Pneumococcal Conjugate-13 08/18/2014   Pneumococcal Polysaccharide-23 07/26/2010   RSV,unspecified 08/27/2022   Tdap 11/12/2018   Pertinent  Health Maintenance Due  Topic Date Due   INFLUENZA VACCINE  Completed   DEXA SCAN  Discontinued      10/12/2022    2:29 PM 10/26/2022    9:07 AM 05/24/2023    3:50 PM 07/26/2023    1:20 PM 08/23/2023    1:48 PM  Fall Risk  Falls in the past year? 0 0 1 1 1   Was there an injury with Fall? 0 0 1 1 1   Fall Risk Category Calculator 0 0 2 2 2   Fall Risk Category (Retired) Low      (RETIRED) Patient Fall Risk Level High fall risk       Patient at Risk for Falls Due to History of fall(s);Impaired balance/gait;Impaired mobility History of fall(s);Impaired balance/gait;Impaired mobility History of fall(s);Impaired balance/gait;Impaired mobility History of fall(s);Impaired balance/gait;Impaired mobility History of fall(s);Impaired balance/gait;Impaired mobility  Fall risk Follow up Falls evaluation completed;Education provided;Falls prevention discussed Falls evaluation completed Falls evaluation completed;Education provided;Falls prevention discussed Falls evaluation completed;Education provided;Falls prevention discussed Falls evaluation completed;Education provided;Falls prevention discussed   Functional Status Survey:    Vitals:   09/20/23 1329  BP: 128/83  Pulse: 81  Resp: 18  Temp: (!) 97.5 F (36.4 C)  SpO2: 99%  Weight: 114 lb 8 oz (51.9 kg)  Height: 5' (1.524 m)   Body mass index is 22.36 kg/m. Physical Exam Vitals reviewed.  Constitutional:      General: She is not in acute distress. HENT:     Head: Normocephalic.     Right Ear: There is no impacted cerumen.     Left Ear: There is no impacted cerumen.     Ears:     Comments: Bilateral hearing aids    Nose: Nose normal.     Mouth/Throat:     Mouth: Mucous membranes are moist.  Eyes:     General:        Right eye: No discharge.        Left eye: No discharge.  Cardiovascular:     Rate and Rhythm: Normal rate and regular rhythm.     Pulses: Normal pulses.     Heart sounds: Normal heart sounds.  Pulmonary:     Effort: Pulmonary effort is normal. No respiratory distress.     Breath sounds: Normal breath sounds. No wheezing or rales.  Abdominal:     General: Bowel sounds are normal.     Palpations: Abdomen is soft.  Musculoskeletal:     Cervical back: Neck supple.     Right lower leg: Edema present.     Left lower leg: Edema present.     Comments: Right foot drop, non pitting edema  Skin:    General: Skin is warm.     Capillary Refill:  Capillary refill takes less than 2 seconds.  Neurological:     General: No focal deficit present.     Mental Status: She is alert. Mental status is at baseline.     Motor: Weakness present.     Gait: Gait abnormal.     Comments: PWC, hoyer transfer  Psychiatric:        Mood and Affect: Mood normal.     Labs reviewed: Recent Labs    10/04/22 0940 06/06/23 0000  NA  139 138  K 3.6 4.3  CL 101 99  CO2 30* 21  BUN 31* 33*  CREATININE 1.0 1.1  CALCIUM 9.1 9.7   Recent Labs    10/04/22 0940 06/06/23 0000  AST 15 15  ALT 9 7  ALKPHOS 55 71  ALBUMIN 3.4* 3.8   Recent Labs    10/04/22 0940 01/17/23 0000 06/06/23 0000  WBC 7.3 7.8 8.4  NEUTROABS 4,256.00 3,767.00 5,082.00  HGB 12.1 11.2* 12.0  HCT 36 34* 35*  PLT 199 346 240   Lab Results  Component Value Date   TSH 1.14 06/06/2023   No results found for: "HGBA1C" Lab Results  Component Value Date   CHOL 175 07/23/2019   HDL 54 07/23/2019   LDLCALC 105 (H) 07/23/2019   TRIG 74 07/23/2019   CHOLHDL 3.2 07/23/2019    Significant Diagnostic Results in last 30 days:  No results found.  Assessment/Plan 1. Chronic cystitis (Primary) - ongoing - recent urine culture E.coli> resolved with Keflex - improved symptoms with adequate hydration - cont nitrofurantoin and pyridium prn  2. Memory impairment - recent BIMS 15/15 - forgetful at times - ? poor hearing as contributing factor  3. Essential hypertension - controlled with metoprolol  4. Stage 3a chronic kidney disease (HCC) - encourage hydration with water - avoid NSAIDS  5. Primary osteoarthritis involving multiple joints - ongoing  - cont scheduled tylenol  6. Bilateral leg edema - non pitting - sedentary lifestyle  - cont metolazone  7. RLS (restless legs syndrome) - stable with Requip  8. Advanced atrophic nonexudative age-related macular degeneration of both eyes without subfoveal involvement - followed by Dr. Cathey Endow - reports worsening  vision - now using magnifying glass more  9. Weight loss - 4 lbs weight loss x 1 month - does not care for food served at [employer] staff Communication: plan discussed with patient and nurse  Labs/tests ordered:  none

## 2023-10-17 ENCOUNTER — Non-Acute Institutional Stay (SKILLED_NURSING_FACILITY): Payer: Self-pay | Admitting: Internal Medicine

## 2023-10-17 DIAGNOSIS — G2581 Restless legs syndrome: Secondary | ICD-10-CM

## 2023-10-17 DIAGNOSIS — R413 Other amnesia: Secondary | ICD-10-CM | POA: Diagnosis not present

## 2023-10-17 DIAGNOSIS — M150 Primary generalized (osteo)arthritis: Secondary | ICD-10-CM

## 2023-10-17 DIAGNOSIS — N1831 Chronic kidney disease, stage 3a: Secondary | ICD-10-CM

## 2023-10-17 DIAGNOSIS — N3020 Other chronic cystitis without hematuria: Secondary | ICD-10-CM | POA: Diagnosis not present

## 2023-10-17 DIAGNOSIS — R600 Localized edema: Secondary | ICD-10-CM | POA: Diagnosis not present

## 2023-10-17 DIAGNOSIS — I10 Essential (primary) hypertension: Secondary | ICD-10-CM

## 2023-10-18 ENCOUNTER — Encounter: Payer: Self-pay | Admitting: Orthopedic Surgery

## 2023-10-18 NOTE — Progress Notes (Signed)
 Ann Jensen

## 2023-10-18 NOTE — Progress Notes (Deleted)
Location:  [facility] Room Number: 1/A Place of Service:  SNF [phone]) Provider:  Octavia Heir, NP   Mahlon Gammon, MD  Patient Care Team: Mahlon Gammon, MD as PCP - General (Internal Medicine) Frederica Kuster, MD as Attending Physician [facility] Medicine)  Extended Emergency Contact Information Primary Emergency Contact: [facility] Address: [phone]          [place] [address] [place] Home Phone: [phone] Mobile Phone: [phone] Relation: Son Secondary Emergency Contact: [facility] Address: [address]          [place], [place] [address] [place] Home Phone: [phone] Relation: Son  Code Status:  DNR Goals of care: Advanced Directive information    06/27/2023   11:15 AM  Advanced Directives  Does Patient Have a Medical Advance Directive? Yes  Type of Estate agent of [place];Out of facility DNR (pink MOST or yellow form)  Does patient want to make changes to medical advance directive? No - Patient declined  Copy of Healthcare Power of Attorney in Chart? Yes - validated most recent copy scanned in chart (See row information)     Chief Complaint  Patient presents with  . Medical Management of Chronic Issues  . Error    HPI:  Pt is a 89 y.o. female seen today for medical management of chronic diseases.       Past Medical History:  Diagnosis Date  . Advanced care planning/counseling discussion 11/18/2018  . Anemia   . Closed compression fracture of L5 lumbar vertebra, sequela 09/08/2019  . Hypertension   . OA (osteoarthritis)   . Obesity (BMI 30-39.9) 01/20/2018  . Osteoporosis   . Renal cyst   . UTI (urinary tract infection) 11/28/2020   11/28/20 urine culture, Citrobacter, Keflex 500mg  tid x 7 day.   . Weight gain 11/25/2018   Past Surgical History:  Procedure Laterality Date  . APPENDECTOMY    . BLADDER SUSPENSION    . CATARACT EXTRACTION    . CESAREAN  SECTION    . REPLACEMENT TOTAL KNEE  1995  . TONSILLECTOMY      No Known Allergies  Outpatient Encounter Medications as of 10/18/2023  Medication Sig  . acetaminophen (TYLENOL) 325 MG tablet Take 650 mg by mouth every 4 (four) hours as needed.  Marland Kitchen acetaminophen (TYLENOL) 325 MG tablet Take 650 mg by mouth 3 (three) times daily.  Marland Kitchen aspirin EC 81 MG tablet Take 81 mg by mouth 2 (two) times a week. Swallow whole.  . Biotin 5000 MCG TABS Take 5,000 mcg by mouth daily.   . bisacodyl (DULCOLAX) 10 MG suppository Place 10 mg rectally every other day. As needed  . Cholecalciferol (VITAMIN D) 50 MCG (2000 UT) CAPS Take 2,000 Units by mouth daily.   . Eyelid Cleansers (OCUSOFT EYELID CLEANSING) PADS Apply 1 Pad topically every morning.  Marland Kitchen ketoconazole (NIZORAL) 2 % shampoo Apply 1 Application topically 2 (two) times a week. Tuesday & Friday.  . Magnesium Hydroxide (MILK OF MAGNESIA PO) Take 30 mLs by mouth daily as needed (for constipation.).  Marland Kitchen metolazone (ZAROXOLYN) 2.5 MG tablet Take 2.5 mg by mouth 2 (two) times a week. On Wed. And Sat. only  . metoprolol tartrate (LOPRESSOR) 25 MG tablet Take 25 mg by mouth every morning.  . metoprolol tartrate (LOPRESSOR) 50 MG tablet Take 50 mg by mouth every evening.  . multivitamin-lutein (OCUVITE-LUTEIN) CAPS capsule Take 1 capsule by mouth 2 (two) times  daily.   . nitrofurantoin (MACRODANTIN) 50 MG capsule Take 1 capsule (50 mg total) by mouth daily.  . phenazopyridine (PYRIDIUM) 100 MG tablet Take 100 mg by mouth daily as needed for pain.  . polyethylene glycol (MIRALAX / GLYCOLAX) 17 g packet Take 17 g by mouth. Every 3 days.  . polyvinyl alcohol (LIQUIFILM TEARS) 1.4 % ophthalmic solution Place 1 drop into both eyes 3 (three) times daily.  . Potassium Chloride ER 20 MEQ TBCR Take 1 tablet by mouth 2 (two) times a week. On Wed. And Sat.  Marland Kitchen rOPINIRole (REQUIP) 1 MG tablet TAKE 1 TABLET EACH EVENING.  . sennosides-docusate sodium (SENOKOT-S) 8.6-50 MG  tablet Take 2 tablets by mouth daily. as needed for constipation at bedtime.  . white petrolatum (VASELINE) ointment Apply 1 Application topically at bedtime. Apply to vaginal folds topically for dryness.  Marland Kitchen White Petrolatum-Mineral Oil (GENTEAL TEARS NIGHT-TIME) OINT Apply to eye as directed. Instill 0.25 inch in both eyes at bedtime for Dry eyes to conjunctival sac  . zinc oxide 20 % ointment Apply 1 application topically as needed for irritation. Special Instructions: To buttocks after every incontinent episode and as needed for redness. May keep at bedside.   No facility-administered encounter medications on file as of 10/18/2023.    Review of Systems  Immunization History  Administered Date(s) Administered  . Fluad Quad(high Dose 65+) 07/25/2022, 07/31/2023  . Influenza, High Dose Seasonal PF 07/10/2017, 07/15/2019, 07/05/2020, 07/25/2021  . Influenza,inj,Quad PF,6+ Mos 07/03/2018  . Influenza-Unspecified 07/04/2015, 07/10/2017  . Moderna Covid-19 Seasonal Vaccine 6 months thru 88years of age 49/03/2022  . Moderna Covid-19 Vaccine Bivalent Booster 57yrs & up 08/07/2023  . Moderna SARS-COV2 Booster Vaccination 03/08/2021, 03/14/2022  . Moderna Sars-Covid-2 Vaccination 10/05/2019, 11/02/2019, 08/15/2020, 06/21/2021  . PFIZER(Purple Top)SARS-COV-2 Vaccination 06/21/2021  . Pneumococcal Conjugate-13 08/18/2014  . Pneumococcal Polysaccharide-23 07/26/2010  . RSV,unspecified 08/27/2022  . Tdap 11/12/2018   Pertinent  Health Maintenance Due  Topic Date Due  . INFLUENZA VACCINE  Completed  . DEXA SCAN  Discontinued      10/26/2022    9:07 AM 05/24/2023    3:50 PM 07/26/2023    1:20 PM 08/23/2023    1:48 PM 09/20/2023    2:57 PM  Fall Risk  Falls in the past year? 0 1 1 1 1   Was there an injury with Fall? 0 1 1 1 1   Fall Risk Category Calculator 0 2 2 2 2   Patient at Risk for Falls Due to History of fall(s);Impaired balance/gait;Impaired mobility History of fall(s);Impaired  balance/gait;Impaired mobility History of fall(s);Impaired balance/gait;Impaired mobility History of fall(s);Impaired balance/gait;Impaired mobility History of fall(s);Impaired balance/gait;Impaired mobility  Fall risk Follow up Falls evaluation completed Falls evaluation completed;Education provided;Falls prevention discussed Falls evaluation completed;Education provided;Falls prevention discussed Falls evaluation completed;Education provided;Falls prevention discussed Falls evaluation completed;Education provided;Falls prevention discussed   Functional Status Survey:    Vitals:   10/18/23 1053  BP: 122/78  Pulse: 66  Resp: 18  Temp: (!) 97.1 F (36.2 C)  SpO2: 93%  Weight: 117 lb 1.6 oz (53.1 kg)  Height: 5' (1.524 m)   Body mass index is 22.87 kg/m. Physical Exam  Labs reviewed: Recent Labs    06/06/23 0000  NA 138  K 4.3  CL 99  CO2 21  BUN 33*  CREATININE 1.1  CALCIUM 9.7    Recent Labs    06/06/23 0000  AST 15  ALT 7  ALKPHOS 71  ALBUMIN 3.8    Recent Labs  01/17/23 0000 06/06/23 0000  WBC 7.8 8.4  NEUTROABS 3,767.00 5,082.00  HGB 11.2* 12.0  HCT 34* 35*  PLT 346 240    Lab Results  Component Value Date   TSH 1.14 06/06/2023   No results found for: "HGBA1C" Lab Results  Component Value Date   CHOL 175 07/23/2019   HDL 54 07/23/2019   LDLCALC 105 (H) 07/23/2019   TRIG 74 07/23/2019   CHOLHDL 3.2 07/23/2019    Significant Diagnostic Results in last 30 days:  No results found.  Assessment/Plan There are no diagnoses linked to this encounter.   Family/ staff Communication: ***  Labs/tests ordered:  ***    This encounter was created in error - please disregard.

## 2023-10-23 ENCOUNTER — Non-Acute Institutional Stay (INDEPENDENT_AMBULATORY_CARE_PROVIDER_SITE_OTHER): Payer: PPO | Admitting: Orthopedic Surgery

## 2023-10-23 ENCOUNTER — Encounter: Payer: Self-pay | Admitting: Orthopedic Surgery

## 2023-10-23 DIAGNOSIS — Z0000 Encounter for general adult medical examination without abnormal findings: Secondary | ICD-10-CM | POA: Diagnosis not present

## 2023-10-23 NOTE — Progress Notes (Signed)
Subjective:   Ann Jensen is a 89 y.o. female who presents for Medicare Annual (Subsequent) preventive examination.  Visit Complete: In person  Patient Medicare AWV questionnaire was completed by the patient on 10/23/2023; I have confirmed that all information answered by patient is correct and no changes since this date.  Cardiac Risk Factors include: advanced age (>69men, >6 women);hypertension;sedentary lifestyle;dyslipidemia     Objective:    Today's Vitals   10/23/23 1301  BP: 121/73  Pulse: 63  Resp: 20  Temp: (!) 96.7 F (35.9 C)  SpO2: 93%  Weight: 117 lb 1.6 oz (53.1 kg)  Height: 5' (1.524 m)   Body mass index is 22.87 kg/m.     06/27/2023   11:15 AM 04/26/2023    9:57 AM 03/21/2023    2:44 PM 03/19/2023   10:46 AM 02/21/2023   10:09 AM 01/28/2023   10:22 AM 01/24/2023    3:32 PM  Advanced Directives  Does Patient Have a Medical Advance Directive? Yes Yes Yes Yes Yes Yes Yes  Type of Estate agent of [place];Out of facility DNR (pink MOST or yellow form) Healthcare Power of [place];Living will;Out of facility DNR (pink MOST or yellow form) Healthcare Power of [place];Living will;Out of facility DNR (pink MOST or yellow form) Healthcare Power of [place];Living will;Out of facility DNR (pink MOST or yellow form) Healthcare Power of [place];Living will;Out of facility DNR (pink MOST or yellow form) Healthcare Power of [place];Living will;Out of facility DNR (pink MOST or yellow form) Healthcare Power of [place];Living will;Out of facility DNR (pink MOST or yellow form)  Does patient want to make changes to medical advance directive? No - Patient declined No - Patient declined No - Patient declined No - Patient declined No - Patient declined No - Patient declined No - Patient declined  Copy of Healthcare Power of Attorney in Chart? Yes - validated most recent copy scanned in chart (See row information) Yes - validated most recent copy scanned  in chart (See row information) Yes - validated most recent copy scanned in chart (See row information) Yes - validated most recent copy scanned in chart (See row information) Yes - validated most recent copy scanned in chart (See row information) Yes - validated most recent copy scanned in chart (See row information) Yes - validated most recent copy scanned in chart (See row information)    Current Medications (verified) Outpatient Encounter Medications as of 10/23/2023  Medication Sig   acetaminophen (TYLENOL) 325 MG tablet Take 650 mg by mouth every 4 (four) hours as needed.   acetaminophen (TYLENOL) 325 MG tablet Take 650 mg by mouth 3 (three) times daily.   aspirin EC 81 MG tablet Take 81 mg by mouth 2 (two) times a week. Swallow whole.   Biotin 5000 MCG TABS Take 5,000 mcg by mouth daily.    bisacodyl (DULCOLAX) 10 MG suppository Place 10 mg rectally every other day. As needed   Cholecalciferol (VITAMIN D) 50 MCG (2000 UT) CAPS Take 2,000 Units by mouth daily.    Eyelid Cleansers (OCUSOFT EYELID CLEANSING) PADS Apply 1 Pad topically every morning.   ketoconazole (NIZORAL) 2 % shampoo Apply 1 Application topically 2 (two) times a week. Tuesday & Friday.   Magnesium Hydroxide (MILK OF MAGNESIA PO) Take 30 mLs by mouth daily as needed (for constipation.).   metolazone (ZAROXOLYN) 2.5 MG tablet Take 2.5 mg by mouth 2 (two) times a week. On Wed. And Sat. only   metoprolol tartrate (LOPRESSOR) 25  MG tablet Take 25 mg by mouth every morning.   metoprolol tartrate (LOPRESSOR) 50 MG tablet Take 50 mg by mouth every evening.   multivitamin-lutein (OCUVITE-LUTEIN) CAPS capsule Take 1 capsule by mouth 2 (two) times daily.    nitrofurantoin (MACRODANTIN) 50 MG capsule Take 1 capsule (50 mg total) by mouth daily.   phenazopyridine (PYRIDIUM) 100 MG tablet Take 100 mg by mouth daily as needed for pain.   polyethylene glycol (MIRALAX / GLYCOLAX) 17 g packet Take 17 g by mouth. Every 3 days.   polyvinyl  alcohol (LIQUIFILM TEARS) 1.4 % ophthalmic solution Place 1 drop into both eyes 3 (three) times daily.   Potassium Chloride ER 20 MEQ TBCR Take 1 tablet by mouth 2 (two) times a week. On Wed. And Sat.   rOPINIRole (REQUIP) 1 MG tablet TAKE 1 TABLET EACH EVENING.   sennosides-docusate sodium (SENOKOT-S) 8.6-50 MG tablet Take 2 tablets by mouth daily. as needed for constipation at bedtime.   white petrolatum (VASELINE) ointment Apply 1 Application topically at bedtime. Apply to vaginal folds topically for dryness.   White Petrolatum-Mineral Oil (GENTEAL TEARS NIGHT-TIME) OINT Apply to eye as directed. Instill 0.25 inch in both eyes at bedtime for Dry eyes to conjunctival sac   zinc oxide 20 % ointment Apply 1 application topically as needed for irritation. Special Instructions: To buttocks after every incontinent episode and as needed for redness. May keep at bedside.   No facility-administered encounter medications on file as of 10/23/2023.    Allergies (verified) Patient has no known allergies.   History: Past Medical History:  Diagnosis Date   Advanced care planning/counseling discussion 11/18/2018   Anemia    Closed compression fracture of L5 lumbar vertebra, sequela 09/08/2019   Hypertension    OA (osteoarthritis)    Obesity (BMI 30-39.9) 01/20/2018   Osteoporosis    Renal cyst    UTI (urinary tract infection) 11/28/2020   11/28/20 urine culture, Citrobacter, Keflex 500mg  tid x 7 day.    Weight gain 11/25/2018   Past Surgical History:  Procedure Laterality Date   APPENDECTOMY     BLADDER SUSPENSION     CATARACT EXTRACTION     CESAREAN SECTION     REPLACEMENT TOTAL KNEE  1995   TONSILLECTOMY     Family History  Problem Relation Age of Onset   Colon cancer Mother    Arthritis Mother    Heart disease Father    Heart attack Father    Migraines Sister    Allergies Sister    Asthma Sister    Social History   Socioeconomic History   Marital status: Widowed    Spouse  name: Not on file   Number of children: 2   Years of education: 3   Highest education level: Not on file  Occupational History   Occupation: Retired Engineer, civil (consulting)  Tobacco Use   Smoking status: Former    Types: E-cigarettes   Smokeless tobacco: Never   Tobacco comments:    when she was 89 years old  Vaping Use   Vaping status: Never Used  Substance and Sexual Activity   Alcohol use: No   Drug use: No   Sexual activity: Never    Birth control/protection: None  Other Topics Concern   Not on file  Social History Narrative   Coffee-with Caffeine   Widow   2 sons   No pets   Retired Charity fundraiser   No exercise   HCPOA, Living Will, No DNR   +  glasses   + hearing aids   Social Drivers of Health   Financial Resource Strain: Low Risk  (10/23/2023)   Overall Financial Resource Strain (CARDIA)    Difficulty of Paying Living Expenses: Not hard at all  Food Insecurity: No Food Insecurity (10/23/2023)   Hunger Vital Sign    Worried About Running Out of Food in the Last Year: Never true    Ran Out of Food in the Last Year: Never true  Transportation Needs: No Transportation Needs (10/23/2023)   PRAPARE - Administrator, Civil Service (Medical): No    Lack of Transportation (Non-Medical): No  Physical Activity: Inactive (10/23/2023)   Exercise Vital Sign    Days of Exercise per Week: 0 days    Minutes of Exercise per Session: 0 min  Stress: No Stress Concern Present (10/23/2023)   [employer] of Occupational Health - Occupational Stress Questionnaire    Feeling of Stress : Not at all  Social Connections: Socially Isolated (10/23/2023)   Social Connection and Isolation Panel [NHANES]    Frequency of Communication with Friends and Family: More than three times a week    Frequency of Social Gatherings with Friends and Family: More than three times a week    Attends Religious Services: Never    Database administrator or Organizations: No    Attends Banker Meetings: Never     Marital Status: Widowed    Tobacco Counseling Counseling given: Not Answered Tobacco comments: when she was 89 years old   Clinical Intake:  Pre-visit preparation completed: No  Pain : No/denies pain     BMI - recorded: 22.87 Nutritional Status: BMI of 19-24  Normal Nutritional Risks: None Diabetes: No  How often do you need to have someone help you when you read instructions, pamphlets, or other written materials from your doctor or pharmacy?: 3 - Sometimes 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 1  Vision? 1  Difficulty concentrating or making decisions? 1  Walking or climbing stairs? 1  Dressing or bathing? 1  Doing errands, shopping? 1  Preparing Food and eating ? Y  Using the Toilet? Y  In the past six months, have you accidently leaked urine? Y  Do you have problems with loss of bowel control? Y  Managing your Medications? Y  Managing your Finances? Y  Housekeeping or managing your Housekeeping? Y    Patient Care Team: Mahlon Gammon, MD as PCP - General (Internal Medicine) Frederica Kuster, MD as Attending Physician (Family Medicine)  Indicate any recent Medical Services you may have received from other than [facility] providers in the past year (date may be approximate).     Assessment:   This is a routine wellness examination for Ann Jensen.  Hearing/Vision screen No results found.   Goals Addressed             This Visit's Progress    DIET - INCREASE WATER INTAKE   Not on track      Depression Screen    10/23/2023    2:22 PM 09/20/2023    2:58 PM 08/23/2023    1:48 PM 07/26/2023    1:20 PM 05/24/2023    3:50 PM 10/26/2022  9:07 AM 10/12/2022    2:28 PM  PHQ 2/9 Scores  PHQ - 2 Score 0 0 0 0 0 0 0    Fall Risk     10/23/2023    2:22 PM 09/20/2023    2:57 PM 08/23/2023    1:48 PM 07/26/2023    1:20 PM 05/24/2023    3:50 PM  Fall Risk   Falls in the past year? 1 1 1 1 1   Number falls in past yr: 0 0 0 0 0  Injury with Fall? 1 1 1 1 1   Risk for fall due to : History of fall(s);Impaired balance/gait;Impaired mobility History of fall(s);Impaired balance/gait;Impaired mobility History of fall(s);Impaired balance/gait;Impaired mobility History of fall(s);Impaired balance/gait;Impaired mobility History of fall(s);Impaired balance/gait;Impaired mobility  Follow up Falls evaluation completed;Education provided Falls evaluation completed;Education provided;Falls prevention discussed Falls evaluation completed;Education provided;Falls prevention discussed Falls evaluation completed;Education provided;Falls prevention discussed Falls evaluation completed;Education provided;Falls prevention discussed    MEDICARE RISK AT HOME: Medicare Risk at Home Any stairs in or around the home?: No If so, are there any without handrails?: No Home free of loose throw rugs in walkways, pet beds, electrical cords, etc?: Yes Adequate lighting in your home to reduce risk of falls?: Yes Life alert?: No Use of a cane, walker or w/c?: Yes Grab bars in the bathroom?: Yes Shower chair or bench in shower?: Yes Elevated toilet seat or a handicapped toilet?: Yes  TIMED UP AND GO:  Was the test performed?  No    Cognitive Function:    10/23/2023    2:23 PM 09/29/2021    1:44 PM 01/01/2018    1:06 PM  MMSE - Mini Mental State Exam  Not completed:  Refused   Orientation to time 5  5  Orientation to Place 5  5  Registration 3  3  Attention/ Calculation 5  4  Recall 0  1  Language- name 2 objects 2  2  Language- repeat 1  1  Language- follow 3 step command 2  3  Language- read & follow direction 1  1  Write a sentence 1  1  Copy design 1  0  Total score 26  26        10/12/2022    2:29 PM 09/29/2021    1:45 PM 07/23/2019    11:30 AM  6CIT Screen  What Year? 0 points 0 points 0 points  What month? 0 points 0 points 0 points  What time? 0 points 0 points 0 points  Count back from 20 0 points 0 points 0 points  Months in reverse 0 points 0 points 0 points  Repeat phrase 4 points 4 points 4 points  Total Score 4 points 4 points 4 points    Immunizations Immunization History  Administered Date(s) Administered   Fluad Quad(high Dose 65+) 07/25/2022, 07/31/2023   Influenza, High Dose Seasonal PF 07/10/2017, 07/15/2019, 07/05/2020, 07/25/2021   Influenza,inj,Quad PF,6+ Mos 07/03/2018   Influenza-Unspecified 07/04/2015, 07/10/2017   Moderna Covid-19 Seasonal Vaccine 6 months thru 88years of age 69/03/2022   [facility] Covid-19 Vaccine Bivalent Booster 70yrs & up 08/07/2023   Moderna SARS-COV2 Booster Vaccination 03/08/2021, 03/14/2022   Moderna Sars-Covid-2 Vaccination 10/05/2019, 11/02/2019, 08/15/2020, 06/21/2021   PFIZER(Purple Top)SARS-COV-2 Vaccination 06/21/2021   Pneumococcal Conjugate-13 08/18/2014   Pneumococcal Polysaccharide-23 07/26/2010   RSV,unspecified 08/27/2022   Tdap 11/12/2018    TDAP status: Up to date  Flu Vaccine status: Up to date  Pneumococcal vaccine status: Up to date  Covid-19  vaccine status: Completed vaccines  Qualifies for Shingles Vaccine? Yes   Zostavax completed No   Shingrix Completed?: No.    Education has been provided regarding the importance of this vaccine. Patient has been advised to call insurance company to determine out of pocket expense if they have not yet received this vaccine. Advised may also receive vaccine at local pharmacy or Health Dept. Verbalized acceptance and understanding.  Screening Tests Health Maintenance  Topic Date Due   COVID-19 Vaccine (6 - 2024-25 season) 10/02/2023   Zoster Vaccines- Shingrix (1 of 2) 11/23/2023 (Originally 05/17/1941)   Medicare Annual Wellness (AWV)  10/22/2024   DTaP/Tdap/Td (2 - Td or Tdap) 11/12/2028   Pneumonia  Vaccine 27+ Years old  Completed   INFLUENZA VACCINE  Completed   HPV VACCINES  Aged Out   DEXA SCAN  Discontinued    Health Maintenance  Health Maintenance Due  Topic Date Due   COVID-19 Vaccine (6 - 2024-25 season) 10/02/2023    Colorectal cancer screening: No longer required.   Mammogram status: No longer required due to advanced age.  Bone Density status: Completed discontinued. Results reflect: Bone density results: NORMAL. Repeat every no further studies> nonambulatory years.  Lung Cancer Screening: (Low Dose CT Chest recommended if Age 76-80 years, 20 pack-year currently smoking OR have quit w/in 15years.) does not qualify.   Lung Cancer Screening Referral: No  Additional Screening:  Hepatitis C Screening: does not qualify; Completed   Vision Screening: Recommended annual ophthalmology exams for early detection of glaucoma and other disorders of the eye. Is the patient up to date with their annual eye exam?  Yes  Who is the provider or what is the name of the office in which the patient attends annual eye exams? Dr. Cathey Endow If pt is not established with a provider, would they like to be referred to a provider to establish care? No .   Dental Screening: Recommended annual dental exams for proper oral hygiene  Diabetic Foot Exam: Diabetic Foot Exam: Completed 01 /22/2025  Community Resource Referral / Chronic Care Management: CRR required this visit?  No   CCM required this visit?  No     Plan:     I have personally reviewed and noted the following in the patient's chart:   Medical and social history Use of alcohol, tobacco or illicit drugs  Current medications and supplements including opioid prescriptions. Patient is not currently taking opioid prescriptions. Functional ability and status Nutritional status Physical activity Advanced directives List of other physicians Hospitalizations, surgeries, and ER visits in previous 12 months Vitals Screenings to  include cognitive, depression, and falls Referrals and appointments  In addition, I have reviewed and discussed with patient certain preventive protocols, quality metrics, and best practice recommendations. A written personalized care plan for preventive services as well as general preventive health recommendations were provided to patient.     Octavia Heir, NP   10/23/2023   After Visit Summary: (MyChart) Due to this being a telephonic visit, the after visit summary with patients personalized plan was offered to patient via MyChart   Nurse Notes: MMSE 26/30. UTD on vaccinations. No further mammograms due to age. No future DEXA since she is nonambulatory.

## 2023-10-23 NOTE — Patient Instructions (Signed)
  Ann Jensen , Thank you for taking time to come for your Medicare Wellness Visit. I appreciate your ongoing commitment to your health goals. Please review the following plan we discussed and let me know if I can assist you in the future.   These are the goals we discussed:  Goals      DIET - INCREASE WATER INTAKE        This is a list of the screening recommended for you and due dates:  Health Maintenance  Topic Date Due   COVID-19 Vaccine (6 - 2024-25 season) 10/02/2023   Zoster (Shingles) Vaccine (1 of 2) 11/23/2023*   Medicare Annual Wellness Visit  10/22/2024   DTaP/Tdap/Td vaccine (2 - Td or Tdap) 11/12/2028   Pneumonia Vaccine  Completed   Flu Shot  Completed   HPV Vaccine  Aged Out   DEXA scan (bone density measurement)  Discontinued  *Topic was postponed. The date shown is not the original due date.

## 2023-10-25 ENCOUNTER — Encounter: Payer: Self-pay | Admitting: Internal Medicine

## 2023-10-25 NOTE — Progress Notes (Signed)
[facility]  SNF (31)  Provider:   Code Status: DNR Goals of Care:     06/27/2023   11:15 AM  Advanced Directives  Does Patient Have a Medical Advance Directive? Yes  Type of Estate agent of [place];Out of facility DNR (pink MOST or yellow form)  Does patient want to make changes to medical advance directive? No - Patient declined  Copy of Healthcare Power of Attorney in Chart? Yes - validated most recent copy scanned in chart (See row information)     Chief Complaint  Patient presents with   Care Management    HPI: Patient is a 89 y.o. female seen today for medical management of chronic diseases.   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    Weight gain 11/25/2018    Past Surgical History:  Procedure Laterality Date   APPENDECTOMY     BLADDER SUSPENSION     CATARACT EXTRACTION     CESAREAN SECTION     REPLACEMENT TOTAL KNEE  1995   TONSILLECTOMY      No Known Allergies  Outpatient Encounter Medications as of 10/17/2023  Medication Sig   acetaminophen (TYLENOL) 325 MG  tablet Take 650 mg by mouth every 4 (four) hours as needed.   acetaminophen (TYLENOL) 325 MG tablet Take 650 mg by mouth 3 (three) times daily.   aspirin EC 81 MG tablet Take 81 mg by mouth 2 (two) times a week. Swallow whole.   Biotin 5000 MCG TABS Take 5,000 mcg by mouth daily.    bisacodyl (DULCOLAX) 10 MG suppository Place 10 mg rectally every other day. As needed   Cholecalciferol (VITAMIN D) 50 MCG (2000 UT) CAPS Take 2,000 Units by mouth daily.    Eyelid Cleansers (OCUSOFT EYELID CLEANSING) PADS Apply 1 Pad topically every morning.   ketoconazole (NIZORAL) 2 % shampoo Apply 1 Application topically 2 (two) times a week. Tuesday & Friday.   Magnesium Hydroxide (MILK OF MAGNESIA PO) Take 30 mLs by mouth daily as needed (for constipation.).   metolazone (ZAROXOLYN) 2.5 MG tablet Take 2.5 mg by mouth 2 (two) times a week. On Wed. And Sat. only   metoprolol tartrate (LOPRESSOR) 25 MG tablet Take 25 mg by mouth every morning.   metoprolol tartrate (LOPRESSOR) 50 MG tablet Take 50 mg by mouth every evening.   multivitamin-lutein (OCUVITE-LUTEIN) CAPS capsule Take 1 capsule by mouth 2 (two) times daily.    nitrofurantoin (MACRODANTIN) 50 MG capsule Take 1 capsule (50 mg total) by mouth daily.   phenazopyridine (PYRIDIUM) 100 MG  tablet Take 100 mg by mouth daily as needed for pain.   polyethylene glycol (MIRALAX / GLYCOLAX) 17 g packet Take 17 g by mouth. Every 3 days.   polyvinyl alcohol (LIQUIFILM TEARS) 1.4 % ophthalmic solution Place 1 drop into both eyes 3 (three) times daily.   Potassium Chloride ER 20 MEQ TBCR Take 1 tablet by mouth 2 (two) times a week. On Wed. And Sat.   rOPINIRole (REQUIP) 1 MG tablet TAKE 1 TABLET EACH EVENING.   sennosides-docusate sodium (SENOKOT-S) 8.6-50 MG tablet Take 2 tablets by mouth daily. as needed for constipation at bedtime.   white petrolatum (VASELINE) ointment Apply 1 Application topically at bedtime. Apply to vaginal folds topically for dryness.   White  Petrolatum-Mineral Oil (GENTEAL TEARS NIGHT-TIME) OINT Apply to eye as directed. Instill 0.25 inch in both eyes at bedtime for Dry eyes to conjunctival sac   zinc oxide 20 % ointment Apply 1 application topically as needed for irritation. Special Instructions: To buttocks after every incontinent episode and as needed for redness. May keep at bedside.   No facility-administered encounter medications on file as of 10/17/2023.    Review of Systems:  Review of Systems  Constitutional:  Negative for activity change and appetite change.  HENT: Negative.    Respiratory:  Negative for cough and shortness of breath.   Cardiovascular:  Negative for leg swelling.  Gastrointestinal:  Positive for constipation.  Genitourinary:  Positive for dysuria.  Musculoskeletal:  Positive for arthralgias, gait problem and myalgias.  Skin: Negative.   Neurological:  Negative for dizziness and weakness.  Psychiatric/Behavioral:  Negative for confusion, dysphoric mood and sleep disturbance.     Health Maintenance  Topic Date Due   COVID-19 Vaccine (6 - 2024-25 season) 10/02/2023   Zoster Vaccines- Shingrix (1 of 2) 11/23/2023 (Originally 05/17/1941)   Medicare Annual Wellness (AWV)  10/22/2024   DTaP/Tdap/Td (2 - Td or Tdap) 11/12/2028   Pneumonia Vaccine 4+ Years old  Completed   INFLUENZA VACCINE  Completed   HPV VACCINES  Aged Out   DEXA SCAN  Discontinued    Physical Exam: Vitals:   10/17/23 1700  BP: 121/73  Pulse: 63  Temp: (!) 96.7 F (35.9 C)  Weight: 117 lb (53.1 kg)   Body mass index is 22.85 kg/m. Physical Exam Vitals reviewed.  Constitutional:      Appearance: Normal appearance.  HENT:     Head: Normocephalic.     Nose: Nose normal.     Mouth/Throat:     Mouth: Mucous membranes are moist.     Pharynx: Oropharynx is clear.  Eyes:     Pupils: Pupils are equal, round, and reactive to light.  Cardiovascular:     Rate and Rhythm: Normal rate and regular rhythm.     Pulses: Normal  pulses.     Heart sounds: Normal heart sounds. No murmur heard. Pulmonary:     Effort: Pulmonary effort is normal.     Breath sounds: Normal breath sounds.  Abdominal:     General: Abdomen is flat. Bowel sounds are normal.     Palpations: Abdomen is soft.  Musculoskeletal:        General: No swelling.     Cervical back: Neck supple.  Skin:    General: Skin is warm.  Neurological:     General: No focal deficit present.     Mental Status: She is alert and oriented to person, place, and time.  Psychiatric:  Mood and Affect: Mood normal.        Thought Content: Thought content normal.     Labs reviewed: Basic Metabolic Panel: Recent Labs    06/06/23 0000  NA 138  K 4.3  CL 99  CO2 21  BUN 33*  CREATININE 1.1  CALCIUM 9.7  TSH 1.14   Liver Function Tests: Recent Labs    06/06/23 0000  AST 15  ALT 7  ALKPHOS 71  ALBUMIN 3.8   No results for input(s): "LIPASE", "AMYLASE" in the last 8760 hours. No results for input(s): "AMMONIA" in the last 8760 hours. CBC: Recent Labs    01/17/23 0000 06/06/23 0000  WBC 7.8 8.4  NEUTROABS 3,767.00 5,082.00  HGB 11.2* 12.0  HCT 34* 35*  PLT 346 240   Lipid Panel: No results for input(s): "CHOL", "HDL", "LDLCALC", "TRIG", "CHOLHDL", "LDLDIRECT" in the last 8760 hours. No results found for: "HGBA1C"  Procedures since last visit: No results found.  Assessment/Plan 1. Chronic cystitis (Primary) No Further work up Tylenol PRN On Low dos eof Macrodantin 2. Memory impairment Recent Issue Stays in SNF Is going to be 89 this year  3. Essential hypertension BP stable on Metoprolol  4. Stage 3a chronic kidney disease (HCC) Labs stable  5. Primary osteoarthritis involving multiple joints Uses only Tylenol  6. Bilateral leg edema Low dose of Metolazone  7. RLS (restless legs syndrome) Requip    Labs/tests ordered:  * No order type specified * Next appt:  Visit date not found

## 2023-10-31 DIAGNOSIS — N183 Chronic kidney disease, stage 3 unspecified: Secondary | ICD-10-CM | POA: Diagnosis not present

## 2023-10-31 DIAGNOSIS — R3989 Other symptoms and signs involving the genitourinary system: Secondary | ICD-10-CM | POA: Diagnosis not present

## 2023-10-31 DIAGNOSIS — Z515 Encounter for palliative care: Secondary | ICD-10-CM | POA: Diagnosis not present

## 2023-10-31 DIAGNOSIS — R54 Age-related physical debility: Secondary | ICD-10-CM | POA: Diagnosis not present

## 2023-11-20 ENCOUNTER — Encounter: Payer: Self-pay | Admitting: Orthopedic Surgery

## 2023-11-20 ENCOUNTER — Non-Acute Institutional Stay (SKILLED_NURSING_FACILITY): Payer: Self-pay | Admitting: Orthopedic Surgery

## 2023-11-20 DIAGNOSIS — I10 Essential (primary) hypertension: Secondary | ICD-10-CM | POA: Diagnosis not present

## 2023-11-20 DIAGNOSIS — R2681 Unsteadiness on feet: Secondary | ICD-10-CM | POA: Diagnosis not present

## 2023-11-20 DIAGNOSIS — N1831 Chronic kidney disease, stage 3a: Secondary | ICD-10-CM | POA: Diagnosis not present

## 2023-11-20 DIAGNOSIS — R600 Localized edema: Secondary | ICD-10-CM

## 2023-11-20 DIAGNOSIS — H353133 Nonexudative age-related macular degeneration, bilateral, advanced atrophic without subfoveal involvement: Secondary | ICD-10-CM

## 2023-11-20 DIAGNOSIS — M150 Primary generalized (osteo)arthritis: Secondary | ICD-10-CM

## 2023-11-20 DIAGNOSIS — G2581 Restless legs syndrome: Secondary | ICD-10-CM

## 2023-11-20 DIAGNOSIS — N3020 Other chronic cystitis without hematuria: Secondary | ICD-10-CM

## 2023-11-20 NOTE — Progress Notes (Signed)
 Location:   [facility]  Nursing Home Room Number: 1-A Place of Service:  SNF [id number]) Provider:  Hazle Nordmann, NP  PCP: Mahlon Gammon, MD  Patient Care Team: Mahlon Gammon, MD as PCP - General (Internal Medicine) Frederica Kuster, MD as Attending Physician [facility])  Extended Emergency Contact Information Primary Emergency Contact: [facility] Address: [phone]          [place] [address] [place] Home Phone: [phone] Mobile Phone: [phone] Relation: Son Secondary Emergency Contact: [facility] Address: [address]          [place], [place] [address] [place] Home Phone: [phone] Relation: Son  Code Status:  DNR Goals of care: Advanced Directive information    11/20/2023    9:51 AM  Advanced Directives  Does Patient Have a Medical Advance Directive? Yes  Type of Estate agent of [place];Out of facility DNR (pink MOST or yellow form);Living will  Does patient want to make changes to medical advance directive? No - Patient declined  Copy of Healthcare Power of Attorney in Chart? Yes - validated most recent copy scanned in chart (See row information)     Chief Complaint  Patient presents with   Medical Management of Chronic Issues    Routine Visit.    Immunizations    Discuss the need for [employer].     HPI:  Pt is a 89 y.o. female seen today for medical management of chronic diseases.    She currently resides on the skilled nursing unit at [facility]. PMH: hypertension, PVD, osteoarthritis of multiple joints, CKD stage 3, posterior tendon dysfunction with right foot drop, constipation, restless leg syndrome and anxiety.     HTN-  BUN/creat 33/1.1 06/06/2023, remains on metoprolol CKD- GFR 45 (09/05)> was 53 (01/04)> 49 (03/2022) Chronic cystitis- 09/26 urine culture > 100,000 cfu/mL E.coli> resolved with Keflex x 7 days, improved symptoms with increased hydration  with water, remains on nitrofurantoin and pyridium prn  OA/chronic right knee pain-  h/o RTKA/ right foot drop, remains on tylenol Unstable gait- hoyer transfer, does not ambulate  BLE- given metolazone/potassium twice weekly, unsuccessful trial of leg wrappings  RLS- remains on requip at bedtime Macular degeneration- followed by Dr. Cathey Endow, f/u 12/10/2023   Recent blood pressures:  02/11- 114/75  02/04- 113/72  01/28- 127/77  Recent weights:  02/02- 117.1 lbs  01/02- 117.1 lbs  12/02- 114.5 lbs    Past Medical History:  Diagnosis Date   Advanced care planning/counseling discussion 11/18/2018   Anemia    Closed compression fracture of L5 lumbar vertebra, sequela 09/08/2019   Hypertension    OA (osteoarthritis)    Obesity (BMI 30-39.9) 01/20/2018   Osteoporosis    Renal cyst    UTI (urinary tract infection) 11/28/2020   11/28/20 urine culture, Citrobacter, Keflex 500mg  tid x 7 day.    Weight gain 11/25/2018   Past Surgical History:  Procedure Laterality Date   APPENDECTOMY     BLADDER SUSPENSION     CATARACT EXTRACTION     CESAREAN SECTION     REPLACEMENT TOTAL KNEE  1995   TONSILLECTOMY      No Known Allergies  Allergies as of 11/20/2023   No Known Allergies      Medication List        Accurate as of November 20, 2023  9:51 AM. If you have any questions, ask your nurse or doctor.  STOP taking these medications    bisacodyl 10 MG suppository Commonly known as: DULCOLAX Stopped by: Tamana Hatfield E Derian Dimalanta   GenTeal Tears Night-Time Oint Stopped by: Eyonna Sandstrom E Solara Goodchild   MILK OF MAGNESIA PO Stopped by: Luberta Robertson Karis Emig   sennosides-docusate sodium 8.6-50 MG tablet Commonly known as: SENOKOT-S Stopped by: Octavia Heir       TAKE these medications    acetaminophen 325 MG tablet Commonly known as: TYLENOL Take 650 mg by mouth every 4 (four) hours as needed.   acetaminophen 325 MG tablet Commonly known as: TYLENOL Take 650 mg by mouth 3 (three) times daily.    aspirin EC 81 MG tablet Take 81 mg by mouth 2 (two) times a week. Swallow whole.   Biotin 5000 MCG Tabs Take 5,000 mcg by mouth daily.   ketoconazole 2 % shampoo Commonly known as: NIZORAL Apply 1 Application topically 2 (two) times a week. Tuesday & Friday.   metolazone 2.5 MG tablet Commonly known as: ZAROXOLYN Take 2.5 mg by mouth 2 (two) times a week. On Wed. And Sat. only   metoprolol tartrate 25 MG tablet Commonly known as: LOPRESSOR Take 25 mg by mouth every morning.   metoprolol tartrate 50 MG tablet Commonly known as: LOPRESSOR Take 50 mg by mouth every evening.   multivitamin-lutein Caps capsule Take 1 capsule by mouth 2 (two) times daily.   nitrofurantoin 50 MG capsule Commonly known as: MACRODANTIN Take 1 capsule (50 mg total) by mouth daily.   OcuSoft Eyelid Cleansing Pads Apply 1 Pad topically every morning.   phenazopyridine 100 MG tablet Commonly known as: PYRIDIUM Take 100 mg by mouth daily as needed for pain.   polyethylene glycol 17 g packet Commonly known as: MIRALAX / GLYCOLAX Take 17 g by mouth 3 (three) times a week. Monday, Wednesday, and Friday.   polyethylene glycol 17 g packet Commonly known as: MIRALAX / GLYCOLAX Take 17 g by mouth as needed.   polyvinyl alcohol 1.4 % ophthalmic solution Commonly known as: LIQUIFILM TEARS Place 1 drop into both eyes 3 (three) times daily.   Potassium Chloride ER 20 MEQ Tbcr Take 1 tablet by mouth 2 (two) times a week. On Wed. And Sat.   rOPINIRole 1 MG tablet Commonly known as: REQUIP TAKE 1 TABLET EACH EVENING.   Vaseline ointment Generic drug: white petrolatum Apply 1 Application topically at bedtime. Apply to vaginal folds topically for dryness.   Vitamin D 50 MCG (2000 UT) Caps Take 2,000 Units by mouth daily.   zinc oxide 20 % ointment Apply 1 application topically as needed for irritation. Special Instructions: To buttocks after every incontinent episode and as needed for redness. May  keep at bedside.        Review of Systems  Constitutional:  Negative for activity change and appetite change.  HENT:  Negative for sore throat and trouble swallowing.   Eyes:  Negative for visual disturbance.  Respiratory:  Negative for cough and shortness of breath.   Cardiovascular:  Positive for leg swelling. Negative for chest pain.  Gastrointestinal:  Positive for constipation. Negative for abdominal distention and abdominal pain.  Genitourinary:  Positive for dysuria and frequency. Negative for hematuria.  Musculoskeletal:  Positive for arthralgias and gait problem.  Neurological:  Positive for weakness. Negative for dizziness and light-headedness.  Psychiatric/Behavioral:  Negative for confusion, dysphoric mood and sleep disturbance. The patient is not nervous/anxious.     Immunization History  Administered Date(s) Administered   Fluad Quad(high Dose 65+)  07/25/2022, 07/31/2023   Influenza, High Dose Seasonal PF 07/10/2017, 07/15/2019, 07/05/2020, 07/25/2021   Influenza,inj,Quad PF,6+ Mos 07/03/2018   Influenza-Unspecified 07/04/2015, 07/10/2017   Moderna Covid-19 Seasonal Vaccine 6 months thru 88years of age 70/03/2022   [facility] Covid-19 Vaccine Bivalent Booster 68yrs & up 08/07/2023   Moderna SARS-COV2 Booster Vaccination 03/08/2021, 03/14/2022   Moderna Sars-Covid-2 Vaccination 10/05/2019, 11/02/2019, 08/15/2020, 06/21/2021   PFIZER(Purple Top)SARS-COV-2 Vaccination 06/21/2021   Pneumococcal Conjugate-13 08/18/2014   Pneumococcal Polysaccharide-23 07/26/2010   RSV,unspecified 08/27/2022   Tdap 11/12/2018   Pertinent  Health Maintenance Due  Topic Date Due   INFLUENZA VACCINE  Completed   DEXA SCAN  Discontinued      05/24/2023    3:50 PM 07/26/2023    1:20 PM 08/23/2023    1:48 PM 09/20/2023    2:57 PM 10/23/2023    2:22 PM  Fall Risk  Falls in the past year? 1 1 1 1 1   Was there an injury with Fall? 1 1 1 1 1   Fall Risk Category Calculator 2 2 2 2 2    Patient at Risk for Falls Due to History of fall(s);Impaired balance/gait;Impaired mobility History of fall(s);Impaired balance/gait;Impaired mobility History of fall(s);Impaired balance/gait;Impaired mobility History of fall(s);Impaired balance/gait;Impaired mobility History of fall(s);Impaired balance/gait;Impaired mobility  Fall risk Follow up Falls evaluation completed;Education provided;Falls prevention discussed Falls evaluation completed;Education provided;Falls prevention discussed Falls evaluation completed;Education provided;Falls prevention discussed Falls evaluation completed;Education provided;Falls prevention discussed Falls evaluation completed;Education provided   Functional Status Survey:    Vitals:   11/20/23 0925  BP: 114/75  Pulse: 76  Resp: 16  Temp: (!) 96.2 F (35.7 C)  SpO2: 96%  Weight: 117 lb 1.6 oz (53.1 kg)  Height: 5' (1.524 m)   Body mass index is 22.87 kg/m. Physical Exam Vitals reviewed.  Constitutional:      General: She is not in acute distress. HENT:     Head: Normocephalic.     Right Ear: There is no impacted cerumen.     Left Ear: There is no impacted cerumen.     Ears:     Comments: Bilateral hearing aids    Nose: Nose normal.     Mouth/Throat:     Mouth: Mucous membranes are moist.  Eyes:     General:        Right eye: No discharge.        Left eye: No discharge.  Cardiovascular:     Rate and Rhythm: Normal rate and regular rhythm.     Pulses: Normal pulses.     Heart sounds: Normal heart sounds.  Pulmonary:     Effort: Pulmonary effort is normal. No respiratory distress.     Breath sounds: Normal breath sounds. No wheezing or rales.  Abdominal:     General: Bowel sounds are normal.     Palpations: Abdomen is soft.  Musculoskeletal:     Cervical back: Neck supple.     Comments: Right foot drop, non pitting edema R>L  Skin:    General: Skin is warm.     Capillary Refill: Capillary refill takes less than 2 seconds.   Neurological:     General: No focal deficit present.     Mental Status: She is alert.     Motor: Weakness present.     Gait: Gait abnormal.     Comments: Hoyer transfer  Psychiatric:        Mood and Affect: Mood normal.     Labs reviewed: Recent Labs    06/06/23  0000  NA 138  K 4.3  CL 99  CO2 21  BUN 33*  CREATININE 1.1  CALCIUM 9.7   Recent Labs    06/06/23 0000  AST 15  ALT 7  ALKPHOS 71  ALBUMIN 3.8   Recent Labs    01/17/23 0000 06/06/23 0000  WBC 7.8 8.4  NEUTROABS 3,767.00 5,082.00  HGB 11.2* 12.0  HCT 34* 35*  PLT 346 240   Lab Results  Component Value Date   TSH 1.14 06/06/2023   No results found for: "HGBA1C" Lab Results  Component Value Date   CHOL 175 07/23/2019   HDL 54 07/23/2019   LDLCALC 105 (H) 07/23/2019   TRIG 74 07/23/2019   CHOLHDL 3.2 07/23/2019    Significant Diagnostic Results in last 30 days:  No results found.  Assessment/Plan 1. Essential hypertension (Primary) - controlled with metoprolol  2. Stage 3a chronic kidney disease (HCC) - encourage hydration with water - avoid NSAIDS  3. Chronic cystitis - ongoing - symptoms improved with adequate water intake - cont nitrofurantoin and pyridium prn  4. Primary osteoarthritis involving multiple joints - cont tylenol   5. Unstable gait - hoyer transfer - ambulates with PWC - cont skilled nursing  6. Bilateral leg edema - non pitting R>L - cont metolazone 2x/week  7. RLS (restless legs syndrome) - stable with Requip  8. Advanced atrophic nonexudative age-related macular degeneration of both eyes without subfoveal involvement - followed by Dr. Cathey Endow scheduled 12/10/2023 - worsening vision last 6 months - using magnifying tools to read    Family/ staff Communication: plan discussed with patient and nurse  Labs/tests ordered:  cbc/diff, cmp 03/03

## 2023-12-02 DIAGNOSIS — I129 Hypertensive chronic kidney disease with stage 1 through stage 4 chronic kidney disease, or unspecified chronic kidney disease: Secondary | ICD-10-CM | POA: Diagnosis not present

## 2023-12-02 DIAGNOSIS — R600 Localized edema: Secondary | ICD-10-CM | POA: Diagnosis not present

## 2023-12-02 DIAGNOSIS — D649 Anemia, unspecified: Secondary | ICD-10-CM | POA: Diagnosis not present

## 2023-12-02 DIAGNOSIS — N183 Chronic kidney disease, stage 3 unspecified: Secondary | ICD-10-CM | POA: Diagnosis not present

## 2023-12-02 LAB — HEPATIC FUNCTION PANEL
ALT: 9 U/L (ref 7–35)
AST: 12 — AB (ref 13–35)
Alkaline Phosphatase: 78 (ref 25–125)
Bilirubin, Total: 0.6

## 2023-12-02 LAB — CBC: RBC: 3.19 — AB (ref 3.87–5.11)

## 2023-12-02 LAB — BASIC METABOLIC PANEL WITH GFR
BUN: 31 — AB (ref 4–21)
CO2: 31 — AB (ref 13–22)
Chloride: 100 (ref 99–108)
Creatinine: 1.1 (ref 0.5–1.1)
Glucose: 115
Potassium: 3.7 meq/L (ref 3.5–5.1)
Sodium: 139 (ref 137–147)

## 2023-12-02 LAB — COMPREHENSIVE METABOLIC PANEL WITH GFR
Albumin: 3.7 (ref 3.5–5.0)
Calcium: 9.3 (ref 8.7–10.7)
Globulin: 3.2
eGFR: 48

## 2023-12-02 LAB — CBC AND DIFFERENTIAL
HCT: 33 — AB (ref 36–46)
Hemoglobin: 10.8 — AB (ref 12.0–16.0)
Neutrophils Absolute: 4366
Platelets: 291 10*3/uL (ref 150–400)
WBC: 8.1

## 2023-12-10 DIAGNOSIS — H353132 Nonexudative age-related macular degeneration, bilateral, intermediate dry stage: Secondary | ICD-10-CM | POA: Diagnosis not present

## 2023-12-11 ENCOUNTER — Encounter: Payer: Self-pay | Admitting: Orthopedic Surgery

## 2023-12-11 ENCOUNTER — Non-Acute Institutional Stay (SKILLED_NURSING_FACILITY): Payer: Self-pay | Admitting: Orthopedic Surgery

## 2023-12-11 DIAGNOSIS — R600 Localized edema: Secondary | ICD-10-CM | POA: Diagnosis not present

## 2023-12-11 DIAGNOSIS — K5901 Slow transit constipation: Secondary | ICD-10-CM

## 2023-12-11 DIAGNOSIS — H353133 Nonexudative age-related macular degeneration, bilateral, advanced atrophic without subfoveal involvement: Secondary | ICD-10-CM

## 2023-12-11 DIAGNOSIS — I10 Essential (primary) hypertension: Secondary | ICD-10-CM

## 2023-12-11 DIAGNOSIS — M150 Primary generalized (osteo)arthritis: Secondary | ICD-10-CM | POA: Diagnosis not present

## 2023-12-11 DIAGNOSIS — N1831 Chronic kidney disease, stage 3a: Secondary | ICD-10-CM | POA: Diagnosis not present

## 2023-12-11 DIAGNOSIS — G2581 Restless legs syndrome: Secondary | ICD-10-CM

## 2023-12-11 DIAGNOSIS — N3020 Other chronic cystitis without hematuria: Secondary | ICD-10-CM | POA: Diagnosis not present

## 2023-12-11 DIAGNOSIS — R634 Abnormal weight loss: Secondary | ICD-10-CM

## 2023-12-11 NOTE — Progress Notes (Signed)
 Location:  [facility] Room Number: 1/A Place of Service:  SNF [phone]) Provider:  Octavia Heir, NP   Mahlon Gammon, MD  Patient Care Team: Mahlon Gammon, MD as PCP - General (Internal Medicine) Frederica Kuster, MD as Attending Physician [facility])  Extended Emergency Contact Information Primary Emergency Contact: [facility] Address: [phone]          [place] [address] [place] Home Phone: [phone] Mobile Phone: [phone] Relation: Son Secondary Emergency Contact: [facility] Address: [address]          [place], [place] [address] [place] Home Phone: [phone] Relation: Son  Code Status:  DNR Goals of care: Advanced Directive information    11/20/2023    9:51 AM  Advanced Directives  Does Patient Have a Medical Advance Directive? Yes  Type of Estate agent of [place];Out of facility DNR (pink MOST or yellow form);Living will  Does patient want to make changes to medical advance directive? No - Patient declined  Copy of Healthcare Power of Attorney in Chart? Yes - validated most recent copy scanned in chart (See row information)     Chief Complaint  Patient presents with   Medical Management of Chronic Issues    HPI:  Pt is a 89 y.o. female seen today for medical management of chronic diseases.    She currently resides on the skilled nursing unit at [facility]. PMH: hypertension, PVD, osteoarthritis of multiple joints, CKD stage 3, posterior tendon dysfunction with right foot drop, constipation, restless leg syndrome and anxiety.     HTN-  BUN/creat 31/1.05 12/02/2023, remains on metoprolol CKD- GFR 48 (03/03)> was 45 (09/05) Chronic cystitis- 09/26 urine culture > 100,000 cfu/mL E.coli> resolved with Keflex x 7 days, improved symptoms with increased hydration with water, remains on nitrofurantoin and pyridium prn  OA/chronic right knee pain-  h/o RTKA/  right foot drop, remains on tylenol BLE- given metolazone/potassium twice weekly, K+ 3.7 12/02/2023, unsuccessful trial of leg wrappings  RLS- remains on requip at bedtime Macular degeneration- followed by Dr. Cathey Endow, f/u 12/10/2023> no new recommendations  Constipation- remains on miralax  No recent falls or injuries. [facility] transfer. Ambulates with [facility].   Recent blood pressures:  03/11- 132/66  03/04- 107/72  02/25- 128/79  Recent weights:  03/01- 118.8 lbs  02/02- 117.1 lbs  01/02- 117.1 lbs   Past Medical History:  Diagnosis Date   Advanced care planning/counseling discussion 11/18/2018   Anemia    Closed compression fracture of L5 lumbar vertebra, sequela 09/08/2019   Hypertension    OA (osteoarthritis)    Obesity (BMI 30-39.9) 01/20/2018   Osteoporosis    Renal cyst    UTI (urinary tract infection) 11/28/2020   11/28/20 urine culture, Citrobacter, Keflex 500mg  tid x 7 day.    Weight gain 11/25/2018   Past Surgical History:  Procedure Laterality Date   APPENDECTOMY     BLADDER SUSPENSION     CATARACT EXTRACTION     CESAREAN SECTION     REPLACEMENT TOTAL KNEE  1995   TONSILLECTOMY      No Known Allergies  Outpatient Encounter Medications as of 12/11/2023  Medication Sig   acetaminophen (TYLENOL) 325 MG tablet Take 650 mg by mouth every 4 (four) hours as needed.   acetaminophen (TYLENOL) 325 MG tablet Take 650 mg by mouth 3 (three) times daily.   aspirin EC 81 MG tablet Take 81 mg by mouth  2 (two) times a week. Swallow whole.   Biotin 5000 MCG TABS Take 5,000 mcg by mouth daily.    Cholecalciferol (VITAMIN D) 50 MCG (2000 UT) CAPS Take 2,000 Units by mouth daily.    Eyelid Cleansers (OCUSOFT EYELID CLEANSING) PADS Apply 1 Pad topically every morning.   ketoconazole (NIZORAL) 2 % shampoo Apply 1 Application topically 2 (two) times a week. Tuesday & Friday.   metolazone (ZAROXOLYN) 2.5 MG tablet Take 2.5 mg by mouth 2 (two) times a week. On Wed. And Sat. only    metoprolol tartrate (LOPRESSOR) 25 MG tablet Take 25 mg by mouth every morning.   metoprolol tartrate (LOPRESSOR) 50 MG tablet Take 50 mg by mouth every evening.   multivitamin-lutein (OCUVITE-LUTEIN) CAPS capsule Take 1 capsule by mouth 2 (two) times daily.    nitrofurantoin (MACRODANTIN) 50 MG capsule Take 1 capsule (50 mg total) by mouth daily.   phenazopyridine (PYRIDIUM) 100 MG tablet Take 100 mg by mouth daily as needed for pain.   polyethylene glycol (MIRALAX / GLYCOLAX) 17 g packet Take 17 g by mouth 3 (three) times a week. Monday, Wednesday, and Friday.   polyethylene glycol (MIRALAX / GLYCOLAX) 17 g packet Take 17 g by mouth as needed.   polyvinyl alcohol (LIQUIFILM TEARS) 1.4 % ophthalmic solution Place 1 drop into both eyes 3 (three) times daily.   Potassium Chloride ER 20 MEQ TBCR Take 1 tablet by mouth 2 (two) times a week. On Wed. And Sat.   rOPINIRole (REQUIP) 1 MG tablet TAKE 1 TABLET EACH EVENING.   white petrolatum (VASELINE) ointment Apply 1 Application topically at bedtime. Apply to vaginal folds topically for dryness.   zinc oxide 20 % ointment Apply 1 application topically as needed for irritation. Special Instructions: To buttocks after every incontinent episode and as needed for redness. May keep at bedside.   No facility-administered encounter medications on file as of 12/11/2023.    Review of Systems  Constitutional: Negative.   HENT:  Positive for hearing loss. Negative for trouble swallowing.   Eyes:  Positive for visual disturbance.  Respiratory:  Negative for cough and shortness of breath.   Cardiovascular:  Positive for leg swelling. Negative for chest pain.  Gastrointestinal:  Positive for constipation. Negative for abdominal pain.  Genitourinary:  Positive for dysuria. Negative for hematuria.  Musculoskeletal:  Positive for arthralgias and gait problem.  Skin:  Negative for wound.  Neurological:  Positive for weakness. Negative for dizziness and headaches.   Psychiatric/Behavioral:  Negative for confusion, dysphoric mood and sleep disturbance. The patient is not nervous/anxious.     Immunization History  Administered Date(s) Administered   Fluad Quad(high Dose 65+) 07/25/2022, 07/31/2023   Influenza, High Dose Seasonal PF 07/10/2017, 07/15/2019, 07/05/2020, 07/25/2021   Influenza,inj,Quad PF,6+ Mos 07/03/2018   Influenza-Unspecified 07/04/2015, 07/10/2017   Moderna Covid-19 Seasonal Vaccine 6 months thru 88years of age 85/03/2022   [facility] Covid-19 Vaccine Bivalent Booster 40yrs & up 08/07/2023   Moderna SARS-COV2 Booster Vaccination 03/08/2021, 03/14/2022   Moderna Sars-Covid-2 Vaccination 10/05/2019, 11/02/2019, 08/15/2020, 06/21/2021   PFIZER(Purple Top)SARS-COV-2 Vaccination 06/21/2021   Pneumococcal Conjugate-13 08/18/2014   Pneumococcal Polysaccharide-23 07/26/2010   RSV,unspecified 08/27/2022   Tdap 11/12/2018   Pertinent  Health Maintenance Due  Topic Date Due   INFLUENZA VACCINE  Completed   DEXA SCAN  Discontinued      07/26/2023    1:20 PM 08/23/2023    1:48 PM 09/20/2023    2:57 PM 10/23/2023    2:22 PM 11/20/2023  1:51 PM  Fall Risk  Falls in the past year? 1 1 1 1 1   Was there an injury with Fall? 1 1 1 1  0  Fall Risk Category Calculator 2 2 2 2 1   Patient at Risk for Falls Due to History of fall(s);Impaired balance/gait;Impaired mobility History of fall(s);Impaired balance/gait;Impaired mobility History of fall(s);Impaired balance/gait;Impaired mobility History of fall(s);Impaired balance/gait;Impaired mobility History of fall(s);Impaired balance/gait;Impaired mobility  Fall risk Follow up Falls evaluation completed;Education provided;Falls prevention discussed Falls evaluation completed;Education provided;Falls prevention discussed Falls evaluation completed;Education provided;Falls prevention discussed Falls evaluation completed;Education provided Falls evaluation completed;Education provided   Functional Status  Survey:    Vitals:   12/11/23 1027  BP: 132/66  Pulse: 68  Resp: 18  Temp: 97.8 F (36.6 C)  SpO2: 95%  Weight: 118 lb 12.8 oz (53.9 kg)  Height: 5' (1.524 m)   Body mass index is 23.2 kg/m. Physical Exam Vitals reviewed.  Constitutional:      General: She is not in acute distress. HENT:     Head: Normocephalic.     Right Ear: There is no impacted cerumen.     Left Ear: There is no impacted cerumen.     Nose: Nose normal.     Mouth/Throat:     Mouth: Mucous membranes are moist.  Eyes:     General:        Right eye: No discharge.        Left eye: No discharge.  Cardiovascular:     Rate and Rhythm: Normal rate and regular rhythm.     Pulses: Normal pulses.     Heart sounds: Normal heart sounds.  Pulmonary:     Effort: Pulmonary effort is normal.     Breath sounds: Normal breath sounds.  Abdominal:     General: Bowel sounds are normal.     Palpations: Abdomen is soft.  Musculoskeletal:     Cervical back: Neck supple.     Right lower leg: Edema present.     Left lower leg: Edema present.     Comments: Right foot drop, non pitting edema  Skin:    General: Skin is warm.     Capillary Refill: Capillary refill takes less than 2 seconds.  Neurological:     General: No focal deficit present.     Mental Status: She is alert and oriented to person, place, and time.     Motor: Weakness present.     Gait: Gait abnormal.     Comments: [facility]  Psychiatric:        Mood and Affect: Mood normal.     Labs reviewed: Recent Labs    06/06/23 0000  NA 138  K 4.3  CL 99  CO2 21  BUN 33*  CREATININE 1.1  CALCIUM 9.7   Recent Labs    06/06/23 0000  AST 15  ALT 7  ALKPHOS 71  ALBUMIN 3.8   Recent Labs    01/17/23 0000 06/06/23 0000  WBC 7.8 8.4  NEUTROABS 3,767.00 5,082.00  HGB 11.2* 12.0  HCT 34* 35*  PLT 346 240   Lab Results  Component Value Date   TSH 1.14 06/06/2023   No results found for: "HGBA1C" Lab Results  Component Value Date   CHOL  175 07/23/2019   HDL 54 07/23/2019   LDLCALC 105 (H) 07/23/2019   TRIG 74 07/23/2019   CHOLHDL 3.2 07/23/2019    Significant Diagnostic Results in last 30 days:  No results found.  Assessment/Plan 1. Essential  hypertension (Primary) - controlled with metoprolol  2. Stage 3a chronic kidney disease (HCC) - recent GFR 48 - encourage hydration with water - avoid NSAIDS  3. Chronic cystitis - ongoing - cont nitrofurantoin and pyridium prn  4. Primary osteoarthritis involving multiple joints - cont tylenol  5. Bilateral leg edema - k+ 3.7 - cont metolazone 2x/week  6. RLS (restless legs syndrome) - cont Requip  7. Advanced atrophic nonexudative age-related macular degeneration of both eyes without subfoveal involvement - 03/11 saw Dr. Cathey Endow - no new orders - f/u in 6 months  8. Slow transit constipation - stable with miralax  9. Weight loss - does not care for food offered - weight down about 5 lbs from last year - cont monthly weights    Family/ staff Communication: plan discussed with patient and nurse  Labs/tests ordered:  none

## 2024-01-20 DIAGNOSIS — R3989 Other symptoms and signs involving the genitourinary system: Secondary | ICD-10-CM | POA: Diagnosis not present

## 2024-01-20 DIAGNOSIS — N183 Chronic kidney disease, stage 3 unspecified: Secondary | ICD-10-CM | POA: Diagnosis not present

## 2024-01-20 DIAGNOSIS — R54 Age-related physical debility: Secondary | ICD-10-CM | POA: Diagnosis not present

## 2024-01-20 DIAGNOSIS — Z515 Encounter for palliative care: Secondary | ICD-10-CM | POA: Diagnosis not present

## 2024-01-23 ENCOUNTER — Non-Acute Institutional Stay (SKILLED_NURSING_FACILITY): Payer: Self-pay | Admitting: Internal Medicine

## 2024-01-23 DIAGNOSIS — N1831 Chronic kidney disease, stage 3a: Secondary | ICD-10-CM

## 2024-01-23 DIAGNOSIS — N3020 Other chronic cystitis without hematuria: Secondary | ICD-10-CM

## 2024-01-23 DIAGNOSIS — I10 Essential (primary) hypertension: Secondary | ICD-10-CM | POA: Diagnosis not present

## 2024-01-23 DIAGNOSIS — R600 Localized edema: Secondary | ICD-10-CM | POA: Diagnosis not present

## 2024-01-23 DIAGNOSIS — G2581 Restless legs syndrome: Secondary | ICD-10-CM | POA: Diagnosis not present

## 2024-01-23 DIAGNOSIS — M150 Primary generalized (osteo)arthritis: Secondary | ICD-10-CM | POA: Diagnosis not present

## 2024-01-23 DIAGNOSIS — K5901 Slow transit constipation: Secondary | ICD-10-CM | POA: Diagnosis not present

## 2024-01-24 ENCOUNTER — Encounter: Payer: Self-pay | Admitting: Internal Medicine

## 2024-01-24 NOTE — Progress Notes (Addendum)
 [facility]  SNF (31)  Provider:   Code Status: DNR Goals of Care:     11/20/2023    9:51 AM  Advanced Directives  Does Patient Have a Medical Advance Directive? Yes  Type of Estate agent of [place];Out of facility DNR (pink MOST or yellow form);Living will  Does patient want to make changes to medical advance directive? No - Patient declined  Copy of Healthcare Power of Attorney in Chart? Yes - validated most recent copy scanned in chart (See row information)     Chief Complaint  Patient presents with   Care Management    HPI: Patient is a 89 y.o. female seen today for medical management of chronic diseases.   Lives [facility]N SNF   Patient has h/o Hypertension, Anxiety, Restless leg Syndrome, Insomnia, Arthritis and Posterior Tendon Dysfunction with right foot Valgus in Right Knee with inability to Ambulate    Chronic Dysuria Now on Low does of Macrodantin  No More work up per her POA Dr Annabell Key  Poor Appetite  Does not like Remeron  Wt Readings from Last 3 Encounters:  01/23/24 121 lb (54.9 kg)  12/11/23 118 lb 12.8 oz (53.9 kg)  11/20/23 117 lb 1.6 oz (53.1 kg)      LE edema on Metolazone   Does have some Cognitive decline Also Has poor Vision due to Macular  Uses Power chair safely [place] for transfers Excited about turning 89 in [place] Past Medical History:  Diagnosis Date   Advanced care planning/counseling discussion 11/18/2018   Anemia    Closed compression fracture of L5 lumbar vertebra, sequela 09/08/2019   Hypertension    OA (osteoarthritis)    Obesity (BMI 30-39.9) 01/20/2018   Osteoporosis    Renal cyst    UTI (urinary tract infection) 11/28/2020   11/28/20 urine culture, Citrobacter, Keflex 500mg  tid x 7 day.    Weight gain 11/25/2018    Past Surgical History:  Procedure Laterality Date   APPENDECTOMY     BLADDER SUSPENSION     CATARACT EXTRACTION     CESAREAN SECTION     REPLACEMENT  TOTAL KNEE  1995   TONSILLECTOMY      No Known Allergies  Outpatient Encounter Medications as of 01/23/2024  Medication Sig   acetaminophen  (TYLENOL ) 325 MG tablet Take 650 mg by mouth every 4 (four) hours as needed.   acetaminophen  (TYLENOL ) 325 MG tablet Take 650 mg by mouth 3 (three) times daily.   aspirin EC 81 MG tablet Take 81 mg by mouth 2 (two) times a week. Swallow whole.   Biotin 5000 MCG TABS Take 5,000 mcg by mouth daily.    Cholecalciferol (VITAMIN D) 50 MCG (2000 UT) CAPS Take 2,000 Units by mouth daily.    Eyelid Cleansers (OCUSOFT EYELID CLEANSING) PADS Apply 1 Pad topically every morning.   ketoconazole (NIZORAL) 2 % shampoo Apply 1 Application topically 2 (two) times a week. Tuesday & Friday.   metolazone  (ZAROXOLYN ) 2.5 MG tablet Take 2.5 mg by mouth 2 (two) times a week. On Wed. And Sat. only   metoprolol  tartrate (LOPRESSOR ) 25 MG tablet Take 25 mg by mouth every morning.   metoprolol  tartrate (LOPRESSOR ) 50 MG tablet Take 50 mg by mouth every evening.   multivitamin-lutein (OCUVITE-LUTEIN) CAPS capsule Take 1 capsule by mouth 2 (two) times daily.    nitrofurantoin  (MACRODANTIN ) 50 MG capsule Take 1 capsule (50 mg total) by mouth daily.   phenazopyridine  (PYRIDIUM ) 100  MG tablet Take 100 mg by mouth daily as needed for pain.   polyethylene glycol (MIRALAX / GLYCOLAX) 17 g packet Take 17 g by mouth 3 (three) times a week. Monday, Wednesday, and Friday.   polyethylene glycol (MIRALAX / GLYCOLAX) 17 g packet Take 17 g by mouth as needed.   polyvinyl alcohol (LIQUIFILM TEARS) 1.4 % ophthalmic solution Place 1 drop into both eyes 3 (three) times daily.   Potassium Chloride  ER 20 MEQ TBCR Take 1 tablet by mouth 2 (two) times a week. On Wed. And Sat.   rOPINIRole  (REQUIP ) 1 MG tablet TAKE 1 TABLET EACH EVENING.   white petrolatum (VASELINE) ointment Apply 1 Application topically at bedtime. Apply to vaginal folds topically for dryness.   zinc  oxide 20 % ointment Apply 1  application topically as needed for irritation. Special Instructions: To buttocks after every incontinent episode and as needed for redness. May keep at bedside.   No facility-administered encounter medications on file as of 01/23/2024.    Review of Systems:  Review of Systems  Constitutional:  Negative for activity change and appetite change.  HENT: Negative.    Respiratory:  Negative for cough and shortness of breath.   Cardiovascular:  Positive for leg swelling.  Gastrointestinal:  Positive for constipation.  Genitourinary:  Positive for dysuria.  Musculoskeletal:  Positive for gait problem. Negative for arthralgias and myalgias.  Skin: Negative.   Neurological:  Negative for dizziness and weakness.  Psychiatric/Behavioral:  Positive for sleep disturbance. Negative for confusion and dysphoric mood.     Health Maintenance  Topic Date Due   Zoster Vaccines- Shingrix (1 of 2) Never done   COVID-19 Vaccine (6 - 2024-25 season) 10/02/2023   INFLUENZA VACCINE  05/01/2024   Medicare Annual Wellness (AWV)  10/22/2024   DTaP/Tdap/Td (2 - Td or Tdap) 11/12/2028   Pneumonia Vaccine 77+ Years old  Completed   HPV VACCINES  Aged Out   Meningococcal B Vaccine  Aged Out   DEXA SCAN  Discontinued    Physical Exam: Vitals:   01/23/24 1152  BP: 136/84  Pulse: 85  Resp: 14  Temp: 98 F (36.7 C)  Weight: 121 lb (54.9 kg)   Body mass index is 23.63 kg/m. Physical Exam Vitals reviewed.  Constitutional:      Appearance: Normal appearance.  HENT:     Head: Normocephalic.     Nose: Nose normal.     Mouth/Throat:     Mouth: Mucous membranes are moist.     Pharynx: Oropharynx is clear.  Eyes:     Pupils: Pupils are equal, round, and reactive to light.  Cardiovascular:     Rate and Rhythm: Normal rate and regular rhythm.     Pulses: Normal pulses.     Heart sounds: Normal heart sounds. No murmur heard. Pulmonary:     Effort: Pulmonary effort is normal.     Breath sounds: Normal  breath sounds.  Abdominal:     General: Abdomen is flat. Bowel sounds are normal.     Palpations: Abdomen is soft.  Musculoskeletal:        General: Swelling present.     Cervical back: Neck supple.  Skin:    General: Skin is warm.  Neurological:     Mental Status: She is alert and oriented to person, place, and time.  Psychiatric:        Mood and Affect: Mood normal.        Thought Content: Thought content normal.  Labs reviewed: Basic Metabolic Panel: Recent Labs    06/06/23 0000  NA 138  K 4.3  CL 99  CO2 21  BUN 33*  CREATININE 1.1  CALCIUM 9.7  TSH 1.14   Liver Function Tests: Recent Labs    06/06/23 0000  AST 15  ALT 7  ALKPHOS 71  ALBUMIN 3.8   No results for input(s): "LIPASE", "AMYLASE" in the last 8760 hours. No results for input(s): "AMMONIA" in the last 8760 hours. CBC: Recent Labs    06/06/23 0000  WBC 8.4  NEUTROABS 5,082.00  HGB 12.0  HCT 35*  PLT 240   Lipid Panel: No results for input(s): "CHOL", "HDL", "LDLCALC", "TRIG", "CHOLHDL", "LDLDIRECT" in the last 8760 hours. No results found for: "HGBA1C"  Procedures since last visit: No results found.  Assessment/Plan 1. Essential hypertension (Primary) Metoprolol   2. Stage 3a chronic kidney disease (HCC) Creat stable  3. Chronic cystitis Macrodantin  Low dose   4. Primary osteoarthritis involving multiple joints Tylenol   5. Bilateral leg edema Low dose of Metolazone   6. RLS (restless legs syndrome) Requip   7. Slow transit constipation Miralax  Labs done in 3/25 were all WNL except HGB of 10.8 Will FOllow  Labs/tests ordered:  * No order type specified * Next appt:  Visit date not found

## 2024-02-12 ENCOUNTER — Encounter: Payer: Self-pay | Admitting: Orthopedic Surgery

## 2024-02-12 ENCOUNTER — Non-Acute Institutional Stay (SKILLED_NURSING_FACILITY): Payer: Self-pay | Admitting: Orthopedic Surgery

## 2024-02-12 DIAGNOSIS — R2242 Localized swelling, mass and lump, left lower limb: Secondary | ICD-10-CM

## 2024-02-12 DIAGNOSIS — M79672 Pain in left foot: Secondary | ICD-10-CM

## 2024-02-12 DIAGNOSIS — R600 Localized edema: Secondary | ICD-10-CM | POA: Diagnosis not present

## 2024-02-12 DIAGNOSIS — R269 Unspecified abnormalities of gait and mobility: Secondary | ICD-10-CM

## 2024-02-12 NOTE — Progress Notes (Addendum)
 Location:  [facility] Room Number: N-01 A Place of Service:  SNF (31) Provider:  Curvin Downing    Patient Care Team: Marguerite Shiley, MD as PCP - General (Internal Medicine) Doreen Gamma, MD as Attending Physician [facility] Medicine)  Extended Emergency Contact Information Primary Emergency Contact: [place] Address: [phone]          [place] [address] [place] Home Phone: [phone] Mobile Phone: [phone] Relation: Son Secondary Emergency Contact: [facility] Address: [address]          [place], [place] [address] [place] Home Phone: [phone] Relation: Son  Code Status:  DNR Goals of care: Advanced Directive information    02/12/2024   10:35 AM  Advanced Directives  Does Patient Have a Medical Advance Directive? Yes  Type of Estate agent of [place];Living will;Out of facility DNR (pink MOST or yellow form)  Does patient want to make changes to medical advance directive? No - Patient declined  Copy of Healthcare Power of Attorney in Chart? Yes - validated most recent copy scanned in chart (See row information)  Pre-existing out of facility DNR order (yellow form or pink MOST form) Pink MOST/Yellow Form most recent copy in chart - Physician notified to receive inpatient order     Chief Complaint  Patient presents with   Acute Visit    Leg pain.    HPI:  Pt is a 89 y.o. female seen today for acute visit due to left leg pain.   She currently resides on the skilled nursing unit at [facility]. PMH: hypertension, PVD, osteoarthritis of multiple joints, CKD stage 3, posterior tendon dysfunction with right foot drop, constipation, restless leg syndrome and anxiety.    She woke up this morning with sharp pain to left foot. Touching or moving foot increases pain. No recent fall or injury. Nursing had not administered morning tylenol  yet. She does have a large lump  to left lower shin. Due to age and goals of care she declined to have dermatology biopsy done in past. She also remains on Requip  in the evening for RLS. Ambulates with PWC. Lower legs are swollen often. She is wearing compression stockings. Remains on metolazone  for swelling. Vitals stable.     Past Medical History:  Diagnosis Date   Advanced care planning/counseling discussion 11/18/2018   Anemia    Closed compression fracture of L5 lumbar vertebra, sequela 09/08/2019   Hypertension    OA (osteoarthritis)    Obesity (BMI 30-39.9) 01/20/2018   Osteoporosis    Renal cyst    UTI (urinary tract infection) 11/28/2020   11/28/20 urine culture, Citrobacter, Keflex 500mg  tid x 7 day.    Weight gain 11/25/2018   Past Surgical History:  Procedure Laterality Date   APPENDECTOMY     BLADDER SUSPENSION     CATARACT EXTRACTION     CESAREAN SECTION     REPLACEMENT TOTAL KNEE  1995   TONSILLECTOMY      No Known Allergies  Outpatient Encounter Medications as of 02/12/2024  Medication Sig   acetaminophen  (TYLENOL ) 325 MG tablet Take 650 mg by mouth every 4 (four) hours as needed.   acetaminophen  (TYLENOL ) 325 MG tablet Take 650 mg by mouth 3 (three) times daily.   aspirin EC 81 MG tablet Take 81 mg by mouth 2 (two) times a week. Swallow whole.   Biotin 5000 MCG TABS Take 5,000 mcg by mouth daily.  Cholecalciferol (VITAMIN D) 50 MCG (2000 UT) CAPS Take 2,000 Units by mouth daily.    Eyelid Cleansers (OCUSOFT EYELID CLEANSING) PADS Apply 1 Pad topically every morning.   ketoconazole (NIZORAL) 2 % shampoo Apply 1 Application topically 2 (two) times a week. Tuesday & Friday.   metolazone  (ZAROXOLYN ) 2.5 MG tablet Take 2.5 mg by mouth 2 (two) times a week. On Wed. And Sat. only   metoprolol  tartrate (LOPRESSOR ) 25 MG tablet Take 25 mg by mouth every morning.   metoprolol  tartrate (LOPRESSOR ) 50 MG tablet Take 50 mg by mouth every evening.   multivitamin-lutein (OCUVITE-LUTEIN) CAPS capsule Take  1 capsule by mouth 2 (two) times daily.    nitrofurantoin  (MACRODANTIN ) 50 MG capsule Take 1 capsule (50 mg total) by mouth daily.   phenazopyridine  (PYRIDIUM ) 100 MG tablet Take 100 mg by mouth daily as needed for pain.   polyethylene glycol (MIRALAX / GLYCOLAX) 17 g packet Take 17 g by mouth 3 (three) times a week. Monday, Wednesday, and Friday.   polyethylene glycol (MIRALAX / GLYCOLAX) 17 g packet Take 17 g by mouth as needed.   polyvinyl alcohol (LIQUIFILM TEARS) 1.4 % ophthalmic solution Place 1 drop into both eyes 3 (three) times daily.   Potassium Chloride  ER 20 MEQ TBCR Take 1 tablet by mouth 2 (two) times a week. On Wed. And Sat.   rOPINIRole  (REQUIP ) 1 MG tablet TAKE 1 TABLET EACH EVENING.   white petrolatum (VASELINE) ointment Apply 1 Application topically at bedtime. Apply to vaginal folds topically for dryness.   zinc  oxide 20 % ointment Apply 1 application topically as needed for irritation. Special Instructions: To buttocks after every incontinent episode and as needed for redness. May keep at bedside.   No facility-administered encounter medications on file as of 02/12/2024.    Review of Systems  Constitutional:  Negative for fever.  Respiratory:  Negative for shortness of breath.   Cardiovascular:  Positive for leg swelling. Negative for chest pain.  Musculoskeletal:  Positive for gait problem, joint swelling and myalgias.  Neurological:  Negative for numbness.  Psychiatric/Behavioral:  Negative for dysphoric mood. The patient is not nervous/anxious.     Immunization History  Administered Date(s) Administered   Fluad Quad(high Dose 65+) 07/25/2022, 07/31/2023   Influenza, High Dose Seasonal PF 07/10/2017, 07/15/2019, 07/05/2020, 07/25/2021   Influenza,inj,Quad PF,6+ Mos 07/03/2018   Influenza-Unspecified 07/04/2015, 07/10/2017   Moderna Covid-19 Seasonal Vaccine 6 months thru 88years of age 24/03/2022   [facility] Covid-19 Vaccine Bivalent Booster 2yrs & up 08/07/2023    Moderna SARS-COV2 Booster Vaccination 03/08/2021, 03/14/2022   Moderna Sars-Covid-2 Vaccination 10/05/2019, 11/02/2019, 08/15/2020, 06/21/2021   PFIZER(Purple Top)SARS-COV-2 Vaccination 06/21/2021   Pneumococcal Conjugate-13 08/18/2014   Pneumococcal Polysaccharide-23 07/26/2010   RSV,unspecified 08/27/2022   Tdap 11/12/2018   Pertinent  Health Maintenance Due  Topic Date Due   INFLUENZA VACCINE  05/01/2024   DEXA SCAN  Discontinued      07/26/2023    1:20 PM 08/23/2023    1:48 PM 09/20/2023    2:57 PM 10/23/2023    2:22 PM 11/20/2023    1:51 PM  Fall Risk  Falls in the past year? 1 1 1 1 1   Was there an injury with Fall? 1 1 1 1  0  Fall Risk Category Calculator 2 2 2 2 1   Patient at Risk for Falls Due to History of fall(s);Impaired balance/gait;Impaired mobility History of fall(s);Impaired balance/gait;Impaired mobility History of fall(s);Impaired balance/gait;Impaired mobility History of fall(s);Impaired balance/gait;Impaired mobility History of fall(s);Impaired balance/gait;Impaired  mobility  Fall risk Follow up Falls evaluation completed;Education provided;Falls prevention discussed Falls evaluation completed;Education provided;Falls prevention discussed Falls evaluation completed;Education provided;Falls prevention discussed Falls evaluation completed;Education provided Falls evaluation completed;Education provided   Functional Status Survey:    Vitals:   02/12/24 1032  BP: 122/70  Pulse: 75  Resp: 18  Temp: 98 F (36.7 C)  SpO2: 93%  Weight: 122 lb 1.6 oz (55.4 kg)  Height: 5' (1.524 m)   Body mass index is 23.85 kg/m. Physical Exam Vitals reviewed.  Constitutional:      General: She is not in acute distress.    Appearance: She is not ill-appearing.  Eyes:     General:        Right eye: No discharge.        Left eye: No discharge.  Cardiovascular:     Rate and Rhythm: Normal rate and regular rhythm.     Pulses: Normal pulses.     Heart sounds: Murmur heard.   Pulmonary:     Effort: Pulmonary effort is normal.     Breath sounds: Normal breath sounds.  Abdominal:     Palpations: Abdomen is soft.  Musculoskeletal:     Cervical back: Neck supple.     Left lower leg: Deformity and tenderness present. No swelling. No edema.     Left foot: Decreased range of motion. Tenderness present. No swelling, deformity or crepitus. Normal pulse.     Comments: Right foot drop  Skin:    General: Skin is warm.     Capillary Refill: Capillary refill takes less than 2 seconds.     Comments: Approx 3-4 cm nodule to left anterior shin, fixed, tender to touch, no skin breakdown/erythema/swelling.   Neurological:     General: No focal deficit present.     Mental Status: She is alert. Mental status is at baseline.     Motor: Weakness present.     Gait: Gait abnormal.     Comments: PWC  Psychiatric:        Mood and Affect: Mood normal.     Labs reviewed: Recent Labs    06/06/23 0000 12/02/23 0000  NA 138 139  K 4.3 3.7  CL 99 100  CO2 21 31*  BUN 33* 31*  CREATININE 1.1 1.1  CALCIUM 9.7 9.3   Recent Labs    06/06/23 0000 12/02/23 0000  AST 15 12*  ALT 7 9  ALKPHOS 71 78  ALBUMIN 3.8 3.7   Recent Labs    06/06/23 0000 12/02/23 0000  WBC 8.4 8.1  NEUTROABS 5,082.00 4,366.00  HGB 12.0 10.8*  HCT 35* 33*  PLT 240 291   Lab Results  Component Value Date   TSH 1.14 06/06/2023   No results found for: HGBA1C Lab Results  Component Value Date   CHOL 175 07/23/2019   HDL 54 07/23/2019   LDLCALC 105 (H) 07/23/2019   TRIG 74 07/23/2019   CHOLHDL 3.2 07/23/2019    Significant Diagnostic Results in last 30 days:  No results found.  Assessment/Plan 1. Left foot pain (Primary) - ongoing  - associated with age, leg weakness, sedentary lifestyle - no recent falls or injury - cont tylenol  - recommend Tramadol  prn if episodes continue  2. Gait abnormality - ambulates with PWC - h/o right foot drop - cont skilled nursing  3.  Bilateral leg edema - intermittent - no swelling - cont metolazone      Family/ staff Communication: plan discussed with patient and nurse  Labs/tests  ordered:  none

## 2024-02-17 ENCOUNTER — Non-Acute Institutional Stay (SKILLED_NURSING_FACILITY): Payer: Self-pay | Admitting: Orthopedic Surgery

## 2024-02-17 ENCOUNTER — Encounter: Payer: Self-pay | Admitting: Orthopedic Surgery

## 2024-02-17 DIAGNOSIS — I10 Essential (primary) hypertension: Secondary | ICD-10-CM | POA: Diagnosis not present

## 2024-02-17 DIAGNOSIS — F5105 Insomnia due to other mental disorder: Secondary | ICD-10-CM

## 2024-02-17 DIAGNOSIS — N1831 Chronic kidney disease, stage 3a: Secondary | ICD-10-CM

## 2024-02-17 DIAGNOSIS — N3020 Other chronic cystitis without hematuria: Secondary | ICD-10-CM

## 2024-02-17 DIAGNOSIS — R600 Localized edema: Secondary | ICD-10-CM | POA: Diagnosis not present

## 2024-02-17 DIAGNOSIS — M79604 Pain in right leg: Secondary | ICD-10-CM | POA: Diagnosis not present

## 2024-02-17 DIAGNOSIS — M79672 Pain in left foot: Secondary | ICD-10-CM

## 2024-02-17 DIAGNOSIS — M150 Primary generalized (osteo)arthritis: Secondary | ICD-10-CM | POA: Diagnosis not present

## 2024-02-17 DIAGNOSIS — G2581 Restless legs syndrome: Secondary | ICD-10-CM | POA: Diagnosis not present

## 2024-02-17 DIAGNOSIS — K5901 Slow transit constipation: Secondary | ICD-10-CM

## 2024-02-17 DIAGNOSIS — M79671 Pain in right foot: Secondary | ICD-10-CM | POA: Diagnosis not present

## 2024-02-17 DIAGNOSIS — F419 Anxiety disorder, unspecified: Secondary | ICD-10-CM | POA: Diagnosis not present

## 2024-02-17 DIAGNOSIS — M79605 Pain in left leg: Secondary | ICD-10-CM | POA: Diagnosis not present

## 2024-02-17 MED ORDER — TRAMADOL HCL 50 MG PO TABS: 25.0000 mg | ORAL_TABLET | Freq: Two times a day (BID) | ORAL | 0 refills | Status: DC | PRN

## 2024-02-17 NOTE — Progress Notes (Signed)
 Location:  [facility] Room Number: 1/A Place of Service:  SNF [phone]) Provider:  Arnetha Bhat, NP   Marguerite Shiley, MD  Patient Care Team: Marguerite Shiley, MD as PCP - General (Internal Medicine) Doreen Gamma, MD as Attending Physician [facility] Medicine)  Extended Emergency Contact Information Primary Emergency Contact: [facility] Address: [phone]          [place] [address] [place] Home Phone: [phone] Mobile Phone: [phone] Relation: Son Secondary Emergency Contact: [facility] Address: [address]          [place], [place] [address] [place] Home Phone: [phone] Relation: Son  Code Status:  DNR Goals of care: Advanced Directive information    02/12/2024   10:35 AM  Advanced Directives  Does Patient Have a Medical Advance Directive? Yes  Type of Estate agent of [place];Living will;Out of facility DNR (pink MOST or yellow form)  Does patient want to make changes to medical advance directive? No - Patient declined  Copy of Healthcare Power of Attorney in Chart? Yes - validated most recent copy scanned in chart (See row information)  Pre-existing out of facility DNR order (yellow form or pink MOST form) Pink MOST/Yellow Form most recent copy in chart - Physician notified to receive inpatient order     Chief Complaint  Patient presents with   Medical Management of Chronic Issues    HPI:  Pt is a 89 y.o. female seen today for medical management of chronic diseases.    She currently resides on the skilled nursing unit at [facility]. PMH: hypertension, PVD, osteoarthritis of multiple joints, CKD stage 3, posterior tendon dysfunction with right foot drop, constipation, restless leg syndrome and anxiety.     Left leg/foot pain- began 05/14, continues to have episodes of shooting pain and aching to lower extremities, requesting something more than tylenol  HTN-   BUN/creat 31/1.05 12/02/2023, remains on metoprolol  CKD- GFR 48 (03/03)> was 45 (09/05) Chronic cystitis- 09/26 urine culture > 100,000 cfu/mL E.coli> resolved with Keflex x 7 days, improved symptoms with increased hydration with water, remains on nitrofurantoin  and pyridium  prn  OA/chronic right knee pain-  h/o RTKA/ right foot drop, remains on tylenol  BLE- given metolazone /potassium twice weekly, K+ 3.7 12/02/2023, unsuccessful trial of leg wrappings  RLS- remains on requip  at bedtime Constipation-  no BM x 3 days, given miralax 05/18 and 05/19, remains on miralax Insomnia/anxiety- remains on hydroxyzine prn  Recent weights:  05/03- 122.1 lbs  04/06- 120 lbs  03/01- 118.8 lbs  Recent blood pressures:  05/13- 122/70  05/06- 127/83  04/29- 102/80       Past Medical History:  Diagnosis Date   Advanced care planning/counseling discussion 11/18/2018   Anemia    Closed compression fracture of L5 lumbar vertebra, sequela 09/08/2019   Hypertension    OA (osteoarthritis)    Obesity (BMI 30-39.9) 01/20/2018   Osteoporosis    Renal cyst    UTI (urinary tract infection) 11/28/2020   11/28/20 urine culture, Citrobacter, Keflex 500mg  tid x 7 day.    Weight gain 11/25/2018   Past Surgical History:  Procedure Laterality Date   APPENDECTOMY     BLADDER SUSPENSION     CATARACT EXTRACTION     CESAREAN SECTION     REPLACEMENT TOTAL KNEE  1995   TONSILLECTOMY      No Known Allergies  Outpatient Encounter Medications as of 02/17/2024  Medication Sig  acetaminophen  (TYLENOL ) 325 MG tablet Take 650 mg by mouth every 4 (four) hours as needed.   acetaminophen  (TYLENOL ) 325 MG tablet Take 650 mg by mouth 3 (three) times daily.   aspirin EC 81 MG tablet Take 81 mg by mouth 2 (two) times a week. Swallow whole.   Biotin 5000 MCG TABS Take 5,000 mcg by mouth daily.    Cholecalciferol (VITAMIN D) 50 MCG (2000 UT) CAPS Take 2,000 Units by mouth daily.    Eyelid Cleansers (OCUSOFT EYELID  CLEANSING) PADS Apply 1 Pad topically every morning.   ketoconazole (NIZORAL) 2 % shampoo Apply 1 Application topically 2 (two) times a week. Tuesday & Friday.   metolazone  (ZAROXOLYN ) 2.5 MG tablet Take 2.5 mg by mouth 2 (two) times a week. On Wed. And Sat. only   metoprolol  tartrate (LOPRESSOR ) 25 MG tablet Take 25 mg by mouth every morning.   metoprolol  tartrate (LOPRESSOR ) 50 MG tablet Take 50 mg by mouth every evening.   multivitamin-lutein (OCUVITE-LUTEIN) CAPS capsule Take 1 capsule by mouth 2 (two) times daily.    nitrofurantoin  (MACRODANTIN ) 50 MG capsule Take 1 capsule (50 mg total) by mouth daily.   phenazopyridine  (PYRIDIUM ) 100 MG tablet Take 100 mg by mouth daily as needed for pain.   polyethylene glycol (MIRALAX / GLYCOLAX) 17 g packet Take 17 g by mouth 3 (three) times a week. Monday, Wednesday, and Friday.   polyethylene glycol (MIRALAX / GLYCOLAX) 17 g packet Take 17 g by mouth as needed.   polyvinyl alcohol (LIQUIFILM TEARS) 1.4 % ophthalmic solution Place 1 drop into both eyes 3 (three) times daily.   Potassium Chloride  ER 20 MEQ TBCR Take 1 tablet by mouth 2 (two) times a week. On Wed. And Sat.   rOPINIRole  (REQUIP ) 1 MG tablet TAKE 1 TABLET EACH EVENING.   white petrolatum (VASELINE) ointment Apply 1 Application topically at bedtime. Apply to vaginal folds topically for dryness.   zinc  oxide 20 % ointment Apply 1 application topically as needed for irritation. Special Instructions: To buttocks after every incontinent episode and as needed for redness. May keep at bedside.   No facility-administered encounter medications on file as of 02/17/2024.    Review of Systems  Unable to perform ROS: Dementia    Immunization History  Administered Date(s) Administered   Fluad Quad(high Dose 65+) 07/25/2022, 07/31/2023   Influenza, High Dose Seasonal PF 07/10/2017, 07/15/2019, 07/05/2020, 07/25/2021   Influenza,inj,Quad PF,6+ Mos 07/03/2018   Influenza-Unspecified 07/04/2015,  07/10/2017   Moderna Covid-19 Seasonal Vaccine 6 months thru 88years of age 48/03/2022   [facility] Covid-19 Vaccine Bivalent Booster 11yrs & up 08/07/2023   Moderna SARS-COV2 Booster Vaccination 03/08/2021, 03/14/2022   Moderna Sars-Covid-2 Vaccination 10/05/2019, 11/02/2019, 08/15/2020, 06/21/2021   PFIZER(Purple Top)SARS-COV-2 Vaccination 06/21/2021   Pneumococcal Conjugate-13 08/18/2014   Pneumococcal Polysaccharide-23 07/26/2010   RSV,unspecified 08/27/2022   Tdap 11/12/2018   Pertinent  Health Maintenance Due  Topic Date Due   INFLUENZA VACCINE  05/01/2024   DEXA SCAN  Discontinued      08/23/2023    1:48 PM 09/20/2023    2:57 PM 10/23/2023    2:22 PM 11/20/2023    1:51 PM 02/14/2024   10:13 AM  Fall Risk  Falls in the past year? 1 1 1 1  0  Was there an injury with Fall? 1 1 1  0 0  Fall Risk Category Calculator 2 2 2 1  0  Patient at Risk for Falls Due to History of fall(s);Impaired balance/gait;Impaired mobility History of fall(s);Impaired balance/gait;Impaired  mobility History of fall(s);Impaired balance/gait;Impaired mobility History of fall(s);Impaired balance/gait;Impaired mobility History of fall(s);Impaired balance/gait  Fall risk Follow up Falls evaluation completed;Education provided;Falls prevention discussed Falls evaluation completed;Education provided;Falls prevention discussed Falls evaluation completed;Education provided Falls evaluation completed;Education provided Falls evaluation completed;Education provided   Functional Status Survey:    Vitals:   02/17/24 0910  BP: 122/70  Pulse: 75  Resp: 18  Temp: 98 F (36.7 C)  SpO2: 93%  Weight: 122 lb 1.6 oz (55.4 kg)  Height: 5' (1.524 m)   Body mass index is 23.85 kg/m. Physical Exam Vitals reviewed.  Constitutional:      General: She is not in acute distress. HENT:     Head: Normocephalic.     Right Ear: There is no impacted cerumen.     Left Ear: There is no impacted cerumen.     Nose: Nose normal.      Mouth/Throat:     Mouth: Mucous membranes are moist.  Eyes:     General:        Right eye: No discharge.        Left eye: No discharge.  Cardiovascular:     Rate and Rhythm: Normal rate and regular rhythm.     Pulses: Normal pulses.     Heart sounds: Murmur heard.  Pulmonary:     Effort: Pulmonary effort is normal. No respiratory distress.     Breath sounds: Normal breath sounds. No wheezing or rales.  Abdominal:     General: Bowel sounds are normal. There is no distension.     Palpations: Abdomen is soft.     Tenderness: There is no abdominal tenderness.  Musculoskeletal:     Cervical back: Neck supple.     Right lower leg: Edema present.     Left lower leg: Edema present.     Comments: Non pitting, moderate kyphosis  Skin:    General: Skin is warm.     Capillary Refill: Capillary refill takes less than 2 seconds.  Neurological:     General: No focal deficit present.     Mental Status: She is alert. Mental status is at baseline.     Motor: Weakness present.     Gait: Gait abnormal.  Psychiatric:        Mood and Affect: Mood normal.     Labs reviewed: Recent Labs    06/06/23 0000 12/02/23 0000  NA 138 139  K 4.3 3.7  CL 99 100  CO2 21 31*  BUN 33* 31*  CREATININE 1.1 1.1  CALCIUM 9.7 9.3   Recent Labs    06/06/23 0000 12/02/23 0000  AST 15 12*  ALT 7 9  ALKPHOS 71 78  ALBUMIN 3.8 3.7   Recent Labs    06/06/23 0000 12/02/23 0000  WBC 8.4 8.1  NEUTROABS 5,082.00 4,366.00  HGB 12.0 10.8*  HCT 35* 33*  PLT 240 291   Lab Results  Component Value Date   TSH 1.14 06/06/2023   No results found for: "HGBA1C" Lab Results  Component Value Date   CHOL 175 07/23/2019   HDL 54 07/23/2019   LDLCALC 105 (H) 07/23/2019   TRIG 74 07/23/2019   CHOLHDL 3.2 07/23/2019    Significant Diagnostic Results in last 30 days:  No results found.  Assessment/Plan 1. Bilateral leg and foot pain (Primary) - ongoing - began 05/14 - cont tylenol   - will try  tramadol  prn - traMADol  (ULTRAM ) 50 MG tablet; Take 0.5 tablets (25 mg total) by mouth  2 (two) times daily as needed.  Dispense: 15 tablet; Refill: 0  2. Essential hypertension - controlled with metoprolol   3. Stage 3a chronic kidney disease (HCC) - encourage hydration with water - avoid NSAIDS  4. Chronic cystitis - no aggressive workup per HPOA - cont nitrofurantoin  and pyridium  prn  5. Primary osteoarthritis involving multiple joints - involves knees and hands - cont tylenol    6. Bilateral leg edema - non pitting  - associated with sedentary lifestyle - cont metolazone  2x/week  7. RLS (restless legs syndrome) - cont Requip   8. Slow transit constipation - abdomen soft  - given miralax prn 05/18 and 05/19  9. Insomnia secondary to anxiety - cont hydroxyzine prn    Family/ staff Communication: plan discussed with patient and nurse  Labs/tests ordered:  none

## 2024-03-02 ENCOUNTER — Other Ambulatory Visit: Payer: Self-pay | Admitting: Orthopedic Surgery

## 2024-03-02 DIAGNOSIS — M79604 Pain in right leg: Secondary | ICD-10-CM

## 2024-03-02 MED ORDER — TRAMADOL HCL 50 MG PO TABS: 25.0000 mg | ORAL_TABLET | Freq: Two times a day (BID) | ORAL | 0 refills | Status: DC | PRN

## 2024-03-03 DIAGNOSIS — R3989 Other symptoms and signs involving the genitourinary system: Secondary | ICD-10-CM | POA: Diagnosis not present

## 2024-03-03 DIAGNOSIS — Z515 Encounter for palliative care: Secondary | ICD-10-CM | POA: Diagnosis not present

## 2024-03-03 DIAGNOSIS — R54 Age-related physical debility: Secondary | ICD-10-CM | POA: Diagnosis not present

## 2024-03-03 DIAGNOSIS — N183 Chronic kidney disease, stage 3 unspecified: Secondary | ICD-10-CM | POA: Diagnosis not present

## 2024-03-09 ENCOUNTER — Non-Acute Institutional Stay (SKILLED_NURSING_FACILITY): Payer: Self-pay | Admitting: Orthopedic Surgery

## 2024-03-09 ENCOUNTER — Encounter: Payer: Self-pay | Admitting: Orthopedic Surgery

## 2024-03-09 DIAGNOSIS — R5383 Other fatigue: Secondary | ICD-10-CM

## 2024-03-09 DIAGNOSIS — R634 Abnormal weight loss: Secondary | ICD-10-CM

## 2024-03-09 DIAGNOSIS — R5381 Other malaise: Secondary | ICD-10-CM | POA: Diagnosis not present

## 2024-03-09 DIAGNOSIS — R160 Hepatomegaly, not elsewhere classified: Secondary | ICD-10-CM | POA: Diagnosis not present

## 2024-03-09 DIAGNOSIS — R110 Nausea: Secondary | ICD-10-CM

## 2024-03-09 DIAGNOSIS — K5901 Slow transit constipation: Secondary | ICD-10-CM | POA: Diagnosis not present

## 2024-03-09 LAB — BASIC METABOLIC PANEL WITH GFR
BUN: 39 — AB (ref 4–21)
CO2: 25 — AB (ref 13–22)
Chloride: 101 (ref 99–108)
Creatinine: 1.6 — AB (ref 0.5–1.1)
Glucose: 175
Potassium: 3.7 meq/L (ref 3.5–5.1)
Sodium: 136 — AB (ref 137–147)

## 2024-03-09 LAB — CBC AND DIFFERENTIAL
HCT: 25 — AB (ref 36–46)
Hemoglobin: 8.1 — AB (ref 12.0–16.0)
Neutrophils Absolute: 15633
Platelets: 305 K/uL (ref 150–400)
WBC: 18.5

## 2024-03-09 LAB — HEPATIC FUNCTION PANEL
ALT: 7 U/L (ref 7–35)
AST: 10 — AB (ref 13–35)
Alkaline Phosphatase: 67 (ref 25–125)
Bilirubin, Total: 0.5

## 2024-03-09 LAB — COMPREHENSIVE METABOLIC PANEL WITH GFR
Albumin: 3.1 — AB (ref 3.5–5.0)
Calcium: 8.4 — AB (ref 8.7–10.7)
Globulin: 2.9
eGFR: 29

## 2024-03-09 LAB — CBC: RBC: 2.31 — AB (ref 3.87–5.11)

## 2024-03-09 NOTE — Progress Notes (Signed)
 Location:  [facility] Room Number: 1/A Place of Service:  SNF [phone]) Provider:  Arnetha Bhat, NP   Marguerite Shiley, MD  Patient Care Team: Marguerite Shiley, MD as PCP - General (Internal Medicine) Doreen Gamma, MD as Attending Physician [facility] Medicine)  Extended Emergency Contact Information Primary Emergency Contact: [facility] Address: [phone]           [address] [place] Home Phone: [phone] Mobile Phone: [phone] Relation: Son Secondary Emergency Contact: [facility] Address: [address]          [place], [place] [address] [place] Home Phone: [phone] Relation: Son  Code Status:  DNR Goals of care: Advanced Directive information    02/12/2024   10:35 AM  Advanced Directives  Does Patient Have a Medical Advance Directive? Yes  Type of Estate agent of [place];Living will;Out of facility DNR (pink MOST or yellow form)  Does patient want to make changes to medical advance directive? No - Patient declined  Copy of Healthcare Power of Attorney in Chart? Yes - validated most recent copy scanned in chart (See row information)  Pre-existing out of facility DNR order (yellow form or pink MOST form) Pink MOST/Yellow Form most recent copy in chart - Physician notified to receive inpatient order     Chief Complaint  Patient presents with   Acute Visit    Malaise and nausea    HPI:  Pt is a 89 y.o. female seen today for acute visit due to malaise and nausea.   She currently resides on the skilled nursing unit at [facility]. PMH: hypertension, PVD, osteoarthritis of multiple joints, CKD stage 3, posterior tendon dysfunction with right foot drop, constipation, restless leg syndrome and anxiety.    This morning she c/o increased malaise. She reports having no energy. She does not want to get out of bed. In addition she reports having lack of appetite and  intermittent nausea. No vomiting. Last bowel movement 06/08 and 06/05. Denies chest pain, shortness of breath or cold symptoms. She is on nitrofurantoin  due to recurrent UTI/ cystitis. Afebrile. 06/03 HR was 41 per chart review. Apical pulse 95 on exam today.   Recent weights:  06/06- 110 lbs  05/01- 122.1 lbs  04/06- 120 lbs      Past Medical History:  Diagnosis Date   Advanced care planning/counseling discussion 11/18/2018   Anemia    Closed compression fracture of L5 lumbar vertebra, sequela 09/08/2019   Hypertension    OA (osteoarthritis)    Obesity (BMI 30-39.9) 01/20/2018   Osteoporosis    Renal cyst    UTI (urinary tract infection) 11/28/2020   11/28/20 urine culture, Citrobacter, Keflex 500mg  tid x 7 day.    Weight gain 11/25/2018   Past Surgical History:  Procedure Laterality Date   APPENDECTOMY     BLADDER SUSPENSION     CATARACT EXTRACTION     CESAREAN SECTION     REPLACEMENT TOTAL KNEE  1995   TONSILLECTOMY      No Known Allergies  Outpatient Encounter Medications as of 03/09/2024  Medication Sig   acetaminophen  (TYLENOL ) 325 MG tablet Take 650 mg by mouth every 4 (four) hours as needed.   acetaminophen  (TYLENOL ) 325 MG tablet Take 650 mg by mouth 3 (three) times daily.   aspirin EC 81 MG tablet Take 81 mg by mouth 2 (two) times a week. Swallow whole.   Biotin 5000 MCG TABS  Take 5,000 mcg by mouth daily.    Cholecalciferol (VITAMIN D) 50 MCG (2000 UT) CAPS Take 2,000 Units by mouth daily.    Eyelid Cleansers (OCUSOFT EYELID CLEANSING) PADS Apply 1 Pad topically every morning.   ketoconazole (NIZORAL) 2 % shampoo Apply 1 Application topically 2 (two) times a week. Tuesday & Friday.   metolazone  (ZAROXOLYN ) 2.5 MG tablet Take 2.5 mg by mouth 2 (two) times a week. On Wed. And Sat. only   metoprolol  tartrate (LOPRESSOR ) 25 MG tablet Take 25 mg by mouth every morning.   metoprolol  tartrate (LOPRESSOR ) 50 MG tablet Take 50 mg by mouth every evening.    multivitamin-lutein (OCUVITE-LUTEIN) CAPS capsule Take 1 capsule by mouth 2 (two) times daily.    nitrofurantoin  (MACRODANTIN ) 50 MG capsule Take 1 capsule (50 mg total) by mouth daily.   phenazopyridine  (PYRIDIUM ) 100 MG tablet Take 100 mg by mouth daily as needed for pain.   polyethylene glycol (MIRALAX / GLYCOLAX) 17 g packet Take 17 g by mouth 3 (three) times a week. Monday, Wednesday, and Friday.   polyethylene glycol (MIRALAX / GLYCOLAX) 17 g packet Take 17 g by mouth as needed.   polyvinyl alcohol (LIQUIFILM TEARS) 1.4 % ophthalmic solution Place 1 drop into both eyes 3 (three) times daily.   Potassium Chloride  ER 20 MEQ TBCR Take 1 tablet by mouth 2 (two) times a week. On Wed. And Sat.   rOPINIRole  (REQUIP ) 1 MG tablet TAKE 1 TABLET EACH EVENING.   traMADol  (ULTRAM ) 50 MG tablet Take 0.5 tablets (25 mg total) by mouth 2 (two) times daily as needed.   white petrolatum (VASELINE) ointment Apply 1 Application topically at bedtime. Apply to vaginal folds topically for dryness.   zinc  oxide 20 % ointment Apply 1 application topically as needed for irritation. Special Instructions: To buttocks after every incontinent episode and as needed for redness. May keep at bedside.   No facility-administered encounter medications on file as of 03/09/2024.    Review of Systems  Constitutional:  Positive for appetite change, fatigue and unexpected weight change. Negative for fever.  HENT:  Negative for congestion, sore throat and trouble swallowing.   Eyes:  Negative for visual disturbance.  Respiratory:  Negative for cough, shortness of breath and wheezing.   Cardiovascular:  Positive for leg swelling. Negative for chest pain.  Gastrointestinal:  Positive for abdominal distention, constipation, diarrhea and nausea. Negative for abdominal pain and vomiting.  Genitourinary:  Positive for dysuria. Negative for frequency, hematuria and vaginal bleeding.  Musculoskeletal:  Positive for gait problem.  Skin:   Negative for wound.  Neurological:  Positive for weakness. Negative for dizziness and headaches.  Psychiatric/Behavioral:  Negative for dysphoric mood and sleep disturbance. The patient is not nervous/anxious.     Immunization History  Administered Date(s) Administered   Fluad Quad(high Dose 65+) 07/25/2022, 07/31/2023   Influenza, High Dose Seasonal PF 07/10/2017, 07/15/2019, 07/05/2020, 07/25/2021   Influenza,inj,Quad PF,6+ Mos 07/03/2018   Influenza-Unspecified 07/04/2015, 07/10/2017   Moderna Covid-19 Seasonal Vaccine 6 months thru 88years of age 49/03/2022   [facility] Covid-19 Vaccine Bivalent Booster 62yrs & up 08/07/2023   Moderna SARS-COV2 Booster Vaccination 03/08/2021, 03/14/2022   Moderna Sars-Covid-2 Vaccination 10/05/2019, 11/02/2019, 08/15/2020, 06/21/2021   PFIZER(Purple Top)SARS-COV-2 Vaccination 06/21/2021   Pneumococcal Conjugate-13 08/18/2014   Pneumococcal Polysaccharide-23 07/26/2010   RSV,unspecified 08/27/2022   Tdap 11/12/2018   Pertinent  Health Maintenance Due  Topic Date Due   INFLUENZA VACCINE  05/01/2024   DEXA SCAN  Discontinued  08/23/2023    1:48 PM 09/20/2023    2:57 PM 10/23/2023    2:22 PM 11/20/2023    1:51 PM 02/14/2024   10:13 AM  Fall Risk  Falls in the past year? 1 1 1 1  0  Was there an injury with Fall? 1 1 1  0 0  Fall Risk Category Calculator 2 2 2 1  0  Patient at Risk for Falls Due to History of fall(s);Impaired balance/gait;Impaired mobility History of fall(s);Impaired balance/gait;Impaired mobility History of fall(s);Impaired balance/gait;Impaired mobility History of fall(s);Impaired balance/gait;Impaired mobility History of fall(s);Impaired balance/gait  Fall risk Follow up Falls evaluation completed;Education provided;Falls prevention discussed Falls evaluation completed;Education provided;Falls prevention discussed Falls evaluation completed;Education provided Falls evaluation completed;Education provided Falls evaluation  completed;Education provided   Functional Status Survey:    Vitals:   03/09/24 1109  BP: 129/82  Pulse: 95  Resp: (!) 22  Temp: (!) 96.7 F (35.9 C)  SpO2: 94%  Weight: 110 lb (49.9 kg)  Height: 5' (1.524 m)   Body mass index is 21.48 kg/m. Physical Exam Vitals reviewed.  Constitutional:      General: She is not in acute distress. HENT:     Head: Normocephalic.     Right Ear: Tympanic membrane normal.     Left Ear: Tympanic membrane normal.     Nose: Nose normal.     Mouth/Throat:     Mouth: Mucous membranes are moist.  Eyes:     General:        Right eye: No discharge.        Left eye: No discharge.  Cardiovascular:     Rate and Rhythm: Normal rate and regular rhythm.     Pulses: Normal pulses.     Heart sounds: Murmur heard.  Pulmonary:     Effort: Pulmonary effort is normal. No respiratory distress.     Breath sounds: Examination of the right-middle field reveals decreased breath sounds. Examination of the left-middle field reveals decreased breath sounds. Decreased breath sounds present. No wheezing or rales.  Abdominal:     General: There is distension.     Palpations: There is no mass.     Tenderness: There is no abdominal tenderness. There is no guarding or rebound.     Hernia: No hernia is present.     Comments: Hypoactive bowel sounds x 4  Musculoskeletal:     Cervical back: Neck supple.     Right lower leg: Edema present.     Left lower leg: Edema present.     Comments: Right foot drop, non pitting edema  Skin:    General: Skin is warm.     Capillary Refill: Capillary refill takes less than 2 seconds.  Neurological:     General: No focal deficit present.     Mental Status: She is alert. Mental status is at baseline.     Motor: Weakness present.     Gait: Gait abnormal.     Comments: hoyer  Psychiatric:        Mood and Affect: Mood normal.     Labs reviewed: Recent Labs    06/06/23 0000 12/02/23 0000  NA 138 139  K 4.3 3.7  CL 99 100   CO2 21 31*  BUN 33* 31*  CREATININE 1.1 1.1  CALCIUM 9.7 9.3   Recent Labs    06/06/23 0000 12/02/23 0000  AST 15 12*  ALT 7 9  ALKPHOS 71 78  ALBUMIN 3.8 3.7   Recent Labs    06/06/23  0000 12/02/23 0000  WBC 8.4 8.1  NEUTROABS 5,082.00 4,366.00  HGB 12.0 10.8*  HCT 35* 33*  PLT 240 291   Lab Results  Component Value Date   TSH 1.14 06/06/2023   No results found for: "HGBA1C" Lab Results  Component Value Date   CHOL 175 07/23/2019   HDL 54 07/23/2019   LDLCALC 105 (H) 07/23/2019   TRIG 74 07/23/2019   CHOLHDL 3.2 07/23/2019    Significant Diagnostic Results in last 30 days:  No results found.  Assessment/Plan 1. Malaise and fatigue (Primary) - onset this morning - diminished lung sounds on exam, afebrile, apical pulse 95 - HR noted to be 41 06/03 per chart review - on nitrofurantoin  for chronic UTI/cystitis - differentials>  PNA, URI, anemia, depression, advanced age  - CXR - cbc/diff - cmp  - if current workup normal consider UA/culture and EKG  2. Nausea - no vomiting  - LBM 06/08 and 06/05 - abdomen distended with hypoactive bowel sounds - suspect from lack of appetite and possible constipation - KUB  3. Weight loss - 10 lbs weight loss within 2 months per chart review - BMI 21.48 - cont monthly weights    Family/ staff Communication: plan discussed with patient and nurse  Labs/tests ordered:  cbc/diff, cmp, CXR, KUB

## 2024-03-10 DIAGNOSIS — R110 Nausea: Secondary | ICD-10-CM | POA: Diagnosis not present

## 2024-03-10 DIAGNOSIS — M6281 Muscle weakness (generalized): Secondary | ICD-10-CM | POA: Diagnosis not present

## 2024-03-11 ENCOUNTER — Encounter: Payer: Self-pay | Admitting: Orthopedic Surgery

## 2024-03-11 ENCOUNTER — Non-Acute Institutional Stay (SKILLED_NURSING_FACILITY): Admitting: Orthopedic Surgery

## 2024-03-11 DIAGNOSIS — F330 Major depressive disorder, recurrent, mild: Secondary | ICD-10-CM | POA: Diagnosis not present

## 2024-03-11 DIAGNOSIS — R630 Anorexia: Secondary | ICD-10-CM | POA: Diagnosis not present

## 2024-03-11 DIAGNOSIS — R110 Nausea: Secondary | ICD-10-CM

## 2024-03-11 DIAGNOSIS — F419 Anxiety disorder, unspecified: Secondary | ICD-10-CM

## 2024-03-11 DIAGNOSIS — D72829 Elevated white blood cell count, unspecified: Secondary | ICD-10-CM | POA: Diagnosis not present

## 2024-03-11 MED ORDER — LORAZEPAM 0.5 MG PO TABS: 0.5000 mg | ORAL_TABLET | Freq: Two times a day (BID) | ORAL | 0 refills | Status: DC | PRN

## 2024-03-11 MED ORDER — ONDANSETRON HCL 4 MG PO TABS: 4.0000 mg | ORAL_TABLET | Freq: Four times a day (QID) | ORAL | 0 refills | Status: DC | PRN

## 2024-03-11 NOTE — Progress Notes (Signed)
 Location:  [facility] Room Number: 1/A Place of Service:  SNF [phone]) Provider:  Arnetha Bhat, NP   Marguerite Shiley, MD  Patient Care Team: Marguerite Shiley, MD as PCP - General (Internal Medicine) Doreen Gamma, MD as Attending Physician [facility] Medicine)  Extended Emergency Contact Information Primary Emergency Contact: [facility] Address: [phone]          [place] [address] [place] Home Phone: [phone] Mobile Phone: [phone] Relation: Son Secondary Emergency Contact: [facility] Address: [address]          [place], [place] [address] [place] Home Phone: [phone] Relation: Son  Code Status:  DNR Goals of care: Advanced Directive information    02/12/2024   10:35 AM  Advanced Directives  Does Patient Have a Medical Advance Directive? Yes  Type of Estate agent of [place];Living will;Out of facility DNR (pink MOST or yellow form)  Does patient want to make changes to medical advance directive? No - Patient declined  Copy of Healthcare Power of Attorney in Chart? Yes - validated most recent copy scanned in chart (See row information)  Pre-existing out of facility DNR order (yellow form or pink MOST form) Pink MOST/Yellow Form most recent copy in chart - Physician notified to receive inpatient order     Chief Complaint  Patient presents with   Acute Visit    Elevated WBC    HPI:  Pt is a 89 y.o. female seen today for acute visit due to elevated WBC.   She currently resides on the skilled nursing unit at [facility]. PMH: hypertension, PVD, osteoarthritis of multiple joints, CKD stage 3, posterior tendon dysfunction with right foot drop, constipation, restless leg syndrome and anxiety.    06/09 she c/o malaise and lack of appetite. Stat labs: WBC 18.5, hgb 8.1, hct 24.9, BUN/creat 39/1.59, GFR 29, other unremarkable. CXR noted bibasilar opacities, no effusions. KUB  noted gas and normal amount of stool in colon. She is currently taking nitrofurantoin  for recurrent UTI. Today, she continues to have malaise and poor appetite. Admits to feeling nauseous. No vomiting episodes. In addition, decline in health has made her feel anxious. No recent panic attacks. Denies chest pain, cough, abdominal pain or vomiting. She admits to feeling short of breath with exertion which is not uncommon for her. Afebrile. Vitals stable.   Family updated on plan of care. Requesting repeat UA/culture. Family also discussed increased depression they have noticed within past few weeks. She is seeing best friends less due to poor health. She has used Remeron  in past for depression.    Past Medical History:  Diagnosis Date   Advanced care planning/counseling discussion 11/18/2018   Anemia    Closed compression fracture of L5 lumbar vertebra, sequela 09/08/2019   Hypertension    OA (osteoarthritis)    Obesity (BMI 30-39.9) 01/20/2018   Osteoporosis    Renal cyst    UTI (urinary tract infection) 11/28/2020   11/28/20 urine culture, Citrobacter, Keflex 500mg  tid x 7 day.    Weight gain 11/25/2018   Past Surgical History:  Procedure Laterality Date   APPENDECTOMY     BLADDER SUSPENSION     CATARACT EXTRACTION     CESAREAN SECTION     REPLACEMENT TOTAL KNEE  1995   TONSILLECTOMY      No Known Allergies  Outpatient Encounter Medications as of 03/11/2024  Medication Sig   acetaminophen  (TYLENOL ) 325 MG tablet  Take 650 mg by mouth every 4 (four) hours as needed.   acetaminophen  (TYLENOL ) 325 MG tablet Take 650 mg by mouth 3 (three) times daily.   aspirin EC 81 MG tablet Take 81 mg by mouth 2 (two) times a week. Swallow whole.   Biotin 5000 MCG TABS Take 5,000 mcg by mouth daily.    Cholecalciferol (VITAMIN D) 50 MCG (2000 UT) CAPS Take 2,000 Units by mouth daily.    Eyelid Cleansers (OCUSOFT EYELID CLEANSING) PADS Apply 1 Pad topically every morning.   ketoconazole (NIZORAL) 2 %  shampoo Apply 1 Application topically 2 (two) times a week. Tuesday & Friday.   metolazone  (ZAROXOLYN ) 2.5 MG tablet Take 2.5 mg by mouth 2 (two) times a week. On Wed. And Sat. only   metoprolol  tartrate (LOPRESSOR ) 25 MG tablet Take 25 mg by mouth every morning.   metoprolol  tartrate (LOPRESSOR ) 50 MG tablet Take 50 mg by mouth every evening.   multivitamin-lutein (OCUVITE-LUTEIN) CAPS capsule Take 1 capsule by mouth 2 (two) times daily.    nitrofurantoin  (MACRODANTIN ) 50 MG capsule Take 1 capsule (50 mg total) by mouth daily.   phenazopyridine  (PYRIDIUM ) 100 MG tablet Take 100 mg by mouth daily as needed for pain.   polyethylene glycol (MIRALAX / GLYCOLAX) 17 g packet Take 17 g by mouth 3 (three) times a week. Monday, Wednesday, and Friday.   polyethylene glycol (MIRALAX / GLYCOLAX) 17 g packet Take 17 g by mouth as needed.   polyvinyl alcohol (LIQUIFILM TEARS) 1.4 % ophthalmic solution Place 1 drop into both eyes 3 (three) times daily.   Potassium Chloride  ER 20 MEQ TBCR Take 1 tablet by mouth 2 (two) times a week. On Wed. And Sat.   rOPINIRole  (REQUIP ) 1 MG tablet TAKE 1 TABLET EACH EVENING.   traMADol  (ULTRAM ) 50 MG tablet Take 0.5 tablets (25 mg total) by mouth 2 (two) times daily as needed.   white petrolatum (VASELINE) ointment Apply 1 Application topically at bedtime. Apply to vaginal folds topically for dryness.   zinc  oxide 20 % ointment Apply 1 application topically as needed for irritation. Special Instructions: To buttocks after every incontinent episode and as needed for redness. May keep at bedside.   No facility-administered encounter medications on file as of 03/11/2024.    Review of Systems  Constitutional:  Positive for fatigue. Negative for fever.  HENT:  Negative for congestion, ear pain, sore throat and trouble swallowing.   Respiratory:  Positive for shortness of breath. Negative for cough and wheezing.   Cardiovascular:  Positive for leg swelling. Negative for chest  pain.  Gastrointestinal:  Positive for constipation and nausea. Negative for abdominal distention, abdominal pain and vomiting.  Genitourinary:  Positive for dysuria. Negative for frequency and hematuria.  Musculoskeletal:  Positive for gait problem.  Skin:  Negative for wound.  Neurological:  Positive for weakness. Negative for dizziness and headaches.  Psychiatric/Behavioral:  Positive for dysphoric mood. Negative for confusion and sleep disturbance. The patient is nervous/anxious.     Immunization History  Administered Date(s) Administered   Fluad Quad(high Dose 65+) 07/25/2022, 07/31/2023   Influenza, High Dose Seasonal PF 07/10/2017, 07/15/2019, 07/05/2020, 07/25/2021   Influenza,inj,Quad PF,6+ Mos 07/03/2018   Influenza-Unspecified 07/04/2015, 07/10/2017   Moderna Covid-19 Seasonal Vaccine 6 months thru 88years of age 78/03/2022   [facility] Covid-19 Vaccine Bivalent Booster 33yrs & up 08/07/2023   Moderna SARS-COV2 Booster Vaccination 03/08/2021, 03/14/2022   Moderna Sars-Covid-2 Vaccination 10/05/2019, 11/02/2019, 08/15/2020, 06/21/2021   PFIZER(Purple Top)SARS-COV-2 Vaccination 06/21/2021  Pneumococcal Conjugate-13 08/18/2014   Pneumococcal Polysaccharide-23 07/26/2010   RSV,unspecified 08/27/2022   Tdap 11/12/2018   Pertinent  Health Maintenance Due  Topic Date Due   INFLUENZA VACCINE  05/01/2024   DEXA SCAN  Discontinued      08/23/2023    1:48 PM 09/20/2023    2:57 PM 10/23/2023    2:22 PM 11/20/2023    1:51 PM 02/14/2024   10:13 AM  Fall Risk  Falls in the past year? 1 1 1 1  0  Was there an injury with Fall? 1 1 1  0 0  Fall Risk Category Calculator 2 2 2 1  0  Patient at Risk for Falls Due to History of fall(s);Impaired balance/gait;Impaired mobility History of fall(s);Impaired balance/gait;Impaired mobility History of fall(s);Impaired balance/gait;Impaired mobility History of fall(s);Impaired balance/gait;Impaired mobility History of fall(s);Impaired balance/gait   Fall risk Follow up Falls evaluation completed;Education provided;Falls prevention discussed Falls evaluation completed;Education provided;Falls prevention discussed Falls evaluation completed;Education provided Falls evaluation completed;Education provided Falls evaluation completed;Education provided   Functional Status Survey:    Vitals:   03/11/24 0949  BP: 115/68  Resp: 20  Temp: (!) 97.5 F (36.4 C)  SpO2: 92%  Weight: 110 lb (49.9 kg)  Height: 5' (1.524 m)   Body mass index is 21.48 kg/m. Physical Exam Vitals reviewed.  Constitutional:      General: She is not in acute distress. HENT:     Head: Normocephalic.     Nose: Nose normal.     Mouth/Throat:     Mouth: Mucous membranes are moist.     Pharynx: No posterior oropharyngeal erythema.  Eyes:     General:        Right eye: No discharge.        Left eye: No discharge.  Cardiovascular:     Rate and Rhythm: Normal rate and regular rhythm.     Pulses: Normal pulses.     Heart sounds: Normal heart sounds.  Pulmonary:     Effort: Pulmonary effort is normal.     Breath sounds: Normal breath sounds.  Abdominal:     General: Bowel sounds are normal. There is no distension.     Palpations: Abdomen is soft.     Tenderness: There is no abdominal tenderness. There is no guarding.  Musculoskeletal:     Cervical back: Neck supple.     Right lower leg: Edema present.     Left lower leg: Edema present.  Skin:    General: Skin is warm.     Capillary Refill: Capillary refill takes less than 2 seconds.  Neurological:     General: No focal deficit present.     Mental Status: She is alert and oriented to person, place, and time.     Motor: Weakness present.     Gait: Gait abnormal.  Psychiatric:        Mood and Affect: Mood normal.     Labs reviewed: Recent Labs    06/06/23 0000 12/02/23 0000  NA 138 139  K 4.3 3.7  CL 99 100  CO2 21 31*  BUN 33* 31*  CREATININE 1.1 1.1  CALCIUM 9.7 9.3   Recent Labs     06/06/23 0000 12/02/23 0000  AST 15 12*  ALT 7 9  ALKPHOS 71 78  ALBUMIN 3.8 3.7   Recent Labs    06/06/23 0000 12/02/23 0000  WBC 8.4 8.1  NEUTROABS 5,082.00 4,366.00  HGB 12.0 10.8*  HCT 35* 33*  PLT 240 291   Lab  Results  Component Value Date   TSH 1.14 06/06/2023   No results found for: HGBA1C Lab Results  Component Value Date   CHOL 175 07/23/2019   HDL 54 07/23/2019   LDLCALC 105 (H) 07/23/2019   TRIG 74 07/23/2019   CHOLHDL 3.2 07/23/2019    Significant Diagnostic Results in last 30 days:  No results found.  Assessment/Plan 1. Leukocytosis, unspecified type (Primary) - 06/09 malaise and lack of appetite - lung sounds diminished - WBC 18.5, afebrile - CXR noted bibasilar opacities - KUB normal gas and stool amount - UA/culture requested by family  - will start doxycycline  100 mg po BID x 7 days for suspected PNA   2. Nausea - lack of appetite x 2 days  - no vomiting  - bowel sounds normal, no distension - KUB normal - consider PPI if no improvement - promote bland foods - ondansetron (ZOFRAN) 4 MG tablet; Take 1 tablet (4 mg total) by mouth every 6 (six) hours as needed for nausea or vomiting.  Dispense: 20 tablet; Refill: 0  3. Lack of appetite - does not care for food served - other differentials: advanced age, FOT, depression, PNA/ infection  - about 10 lb weight loss within last 2 months   4. Mild episode of recurrent major depressive disorder (HCC) - reported by family - began 2-3 weeks ago - associated with friend declining health - past use of Remeron  - consider restarting if no improvement   5. Anxiety - increased anxiousness due to not feeling well  - no panic attacks  - LORazepam  (ATIVAN ) 0.5 MG tablet; Take 1 tablet (0.5 mg total) by mouth 2 (two) times daily as needed for anxiety.  Dispense: 30 tablet; Refill: 0    Family/ staff Communication: plan discussed with patient and nurse  Labs/tests ordered:  UA/culture

## 2024-03-16 ENCOUNTER — Non-Acute Institutional Stay (SKILLED_NURSING_FACILITY): Payer: Self-pay | Admitting: Orthopedic Surgery

## 2024-03-16 ENCOUNTER — Encounter: Payer: Self-pay | Admitting: Orthopedic Surgery

## 2024-03-16 DIAGNOSIS — D72829 Elevated white blood cell count, unspecified: Secondary | ICD-10-CM

## 2024-03-16 DIAGNOSIS — F419 Anxiety disorder, unspecified: Secondary | ICD-10-CM

## 2024-03-16 DIAGNOSIS — R110 Nausea: Secondary | ICD-10-CM

## 2024-03-16 DIAGNOSIS — R627 Adult failure to thrive: Secondary | ICD-10-CM

## 2024-03-16 NOTE — Progress Notes (Unsigned)
 Location:  [facility] Room Number: 1/A Place of Service:  SNF [id number]) Provider:  Arnetha Bhat, NP  Marguerite Shiley, MD  Patient Care Team: Marguerite Shiley, MD as PCP - General (Internal Medicine) Doreen Gamma, MD as Attending Physician [facility])  Extended Emergency Contact Information Primary Emergency Contact: [facility] Address: [id number]          [place] [address] [place] Home Phone: [phone] Mobile Phone: [phone] Relation: Son Secondary Emergency Contact: [facility] Address: [address]          [place], [place] [address] [place] Home Phone: [phone] Relation: Son  Code Status:  DNR Goals of care: Advanced Directive information    02/12/2024   10:35 AM  Advanced Directives  Does Patient Have a Medical Advance Directive? Yes  Type of Estate agent of [place];Living will;Out of facility DNR (pink MOST or yellow form)  Does patient want to make changes to medical advance directive? No - Patient declined  Copy of Healthcare Power of Attorney in Chart? Yes - validated most recent copy scanned in chart (See row information)  Pre-existing out of facility DNR order (yellow form or pink MOST form) Pink MOST/Yellow Form most recent copy in chart - Physician notified to receive inpatient order     Chief Complaint  Patient presents with   Acute Visit    Poor appetite, nausea    HPI:  Pt is a 89 y.o. female seen today for acute visit due to poor appetite and nausea.   She currently resides on the skilled nursing unit at [facility]. PMH: hypertension, PVD, osteoarthritis of multiple joints, CKD stage 3, posterior tendon dysfunction with right foot drop, constipation, restless leg syndrome and anxiety.    06/09 she c/o malaise and lack of appetite. Stat labs: WBC 18.5, hgb 8.1, hct 24.9, BUN/creat 39/1.59, GFR 29, other unremarkable. CXR noted bibasilar  opacities, no effusions. KUB noted gas and normal amount of stool in colon. She is currently taking nitrofurantoin  for recurrent UTI. Today, she continues to have malaise and poor appetite. Admits to feeling nauseous. No vomiting episodes. In addition, decline in health has made her feel anxious. No recent panic attacks. Denies chest pain, cough, abdominal pain or vomiting. She admits to feeling short of breath with exertion which is not uncommon for her. Afebrile. Vitals stable.  She continues to have poor appetite and nausea. Zofran  prn was started last week but does not look like it has been given per chart review. She has lost another 4 lbs within the past week. See weight trends below. Refusing meal supplement interventions like BOOST. She was on Remeron  in past for depression and weight loss. Family agreeable to trying medication again. She is spending more days in bed than power wheelchair. She admits to feeling more tired and anxious Ativan  prn ordered last week.   06/11 she was prescribed doxycycline  x 7 days for suspected PNA. No cough. Remains afebrile. Family requested UA/culture. Nursing unable to obtain urine sample. She continues to take nitrofurantoin  for recurrent UTI.   Recent weights:  06/06- 110 lbs  06/04- 114 lbs  05/03- 122 lbs  04/06- 120 lbs    Past Medical History:  Diagnosis Date   Advanced care planning/counseling discussion 11/18/2018   Anemia    Closed compression fracture of L5 lumbar vertebra, sequela 09/08/2019   Hypertension    OA (osteoarthritis)    Obesity (BMI 30-39.9)  01/20/2018   Osteoporosis    Renal cyst    UTI (urinary tract infection) 11/28/2020   11/28/20 urine culture, Citrobacter, Keflex 500mg  tid x 7 day.    Weight gain 11/25/2018   Past Surgical History:  Procedure Laterality Date   APPENDECTOMY     BLADDER SUSPENSION     CATARACT EXTRACTION     CESAREAN SECTION     REPLACEMENT TOTAL KNEE  1995   TONSILLECTOMY      No Known  Allergies  Outpatient Encounter Medications as of 03/16/2024  Medication Sig   acetaminophen  (TYLENOL ) 325 MG tablet Take 650 mg by mouth every 4 (four) hours as needed.   acetaminophen  (TYLENOL ) 325 MG tablet Take 650 mg by mouth 3 (three) times daily.   aspirin EC 81 MG tablet Take 81 mg by mouth 2 (two) times a week. Swallow whole.   Biotin 5000 MCG TABS Take 5,000 mcg by mouth daily.    Cholecalciferol (VITAMIN D) 50 MCG (2000 UT) CAPS Take 2,000 Units by mouth daily.    Eyelid Cleansers (OCUSOFT EYELID CLEANSING) PADS Apply 1 Pad topically every morning.   ketoconazole (NIZORAL) 2 % shampoo Apply 1 Application topically 2 (two) times a week. Tuesday & Friday.   LORazepam  (ATIVAN ) 0.5 MG tablet Take 1 tablet (0.5 mg total) by mouth 2 (two) times daily as needed for anxiety.   metolazone  (ZAROXOLYN ) 2.5 MG tablet Take 2.5 mg by mouth 2 (two) times a week. On Wed. And Sat. only   metoprolol  tartrate (LOPRESSOR ) 25 MG tablet Take 25 mg by mouth every morning.   metoprolol  tartrate (LOPRESSOR ) 50 MG tablet Take 50 mg by mouth every evening.   multivitamin-lutein (OCUVITE-LUTEIN) CAPS capsule Take 1 capsule by mouth 2 (two) times daily.    nitrofurantoin  (MACRODANTIN ) 50 MG capsule Take 1 capsule (50 mg total) by mouth daily.   ondansetron  (ZOFRAN ) 4 MG tablet Take 1 tablet (4 mg total) by mouth every 6 (six) hours as needed for nausea or vomiting.   phenazopyridine  (PYRIDIUM ) 100 MG tablet Take 100 mg by mouth daily as needed for pain.   polyethylene glycol (MIRALAX / GLYCOLAX) 17 g packet Take 17 g by mouth 3 (three) times a week. Monday, Wednesday, and Friday.   polyethylene glycol (MIRALAX / GLYCOLAX) 17 g packet Take 17 g by mouth as needed.   polyvinyl alcohol (LIQUIFILM TEARS) 1.4 % ophthalmic solution Place 1 drop into both eyes 3 (three) times daily.   Potassium Chloride  ER 20 MEQ TBCR Take 1 tablet by mouth 2 (two) times a week. On Wed. And Sat.   rOPINIRole  (REQUIP ) 1 MG tablet TAKE 1  TABLET EACH EVENING.   traMADol  (ULTRAM ) 50 MG tablet Take 0.5 tablets (25 mg total) by mouth 2 (two) times daily as needed.   white petrolatum (VASELINE) ointment Apply 1 Application topically at bedtime. Apply to vaginal folds topically for dryness.   zinc  oxide 20 % ointment Apply 1 application topically as needed for irritation. Special Instructions: To buttocks after every incontinent episode and as needed for redness. May keep at bedside.   No facility-administered encounter medications on file as of 03/16/2024.    Review of Systems  Constitutional:  Positive for activity change, appetite change, fatigue and unexpected weight change. Negative for fever.  HENT:  Positive for hearing loss. Negative for congestion, sore throat and trouble swallowing.   Eyes:  Positive for visual disturbance.  Respiratory:  Negative for cough, shortness of breath and wheezing.   Cardiovascular:  Positive for leg swelling. Negative for chest pain.  Gastrointestinal:  Positive for constipation and nausea. Negative for abdominal distention, abdominal pain, diarrhea and vomiting.  Genitourinary:  Negative for dysuria, frequency and hematuria.  Musculoskeletal:  Positive for arthralgias and gait problem.  Skin:  Negative for wound.  Neurological:  Positive for weakness. Negative for dizziness and headaches.  Psychiatric/Behavioral:  Positive for confusion and dysphoric mood. Negative for sleep disturbance. The patient is nervous/anxious.     Immunization History  Administered Date(s) Administered   Fluad Quad(high Dose 65+) 07/25/2022, 07/31/2023   Influenza, High Dose Seasonal PF 07/10/2017, 07/15/2019, 07/05/2020, 07/25/2021   Influenza,inj,Quad PF,6+ Mos 07/03/2018   Influenza-Unspecified 07/04/2015, 07/10/2017   Moderna Covid-19 Seasonal Vaccine 6 months thru 88years of age 74/03/2022   [facility] Covid-19 Vaccine Bivalent Booster 43yrs & up 08/07/2023   Moderna SARS-COV2 Booster Vaccination 03/08/2021,  03/14/2022   Moderna Sars-Covid-2 Vaccination 10/05/2019, 11/02/2019, 08/15/2020, 06/21/2021   PFIZER(Purple Top)SARS-COV-2 Vaccination 06/21/2021   Pneumococcal Conjugate-13 08/18/2014   Pneumococcal Polysaccharide-23 07/26/2010   RSV,unspecified 08/27/2022   Tdap 11/12/2018   Pertinent  Health Maintenance Due  Topic Date Due   INFLUENZA VACCINE  05/01/2024   DEXA SCAN  Discontinued      08/23/2023    1:48 PM 09/20/2023    2:57 PM 10/23/2023    2:22 PM 11/20/2023    1:51 PM 02/14/2024   10:13 AM  Fall Risk  Falls in the past year? 1 1 1 1  0  Was there an injury with Fall? 1 1 1  0 0  Fall Risk Category Calculator 2 2 2 1  0  Patient at Risk for Falls Due to History of fall(s);Impaired balance/gait;Impaired mobility History of fall(s);Impaired balance/gait;Impaired mobility History of fall(s);Impaired balance/gait;Impaired mobility History of fall(s);Impaired balance/gait;Impaired mobility History of fall(s);Impaired balance/gait  Fall risk Follow up Falls evaluation completed;Education provided;Falls prevention discussed Falls evaluation completed;Education provided;Falls prevention discussed Falls evaluation completed;Education provided Falls evaluation completed;Education provided Falls evaluation completed;Education provided   Functional Status Survey:    Vitals:   03/16/24 1629  BP: 112/76  Pulse: 90  Resp: 20  Temp: (!) 97.5 F (36.4 C)  SpO2: 92%  Weight: 110 lb (49.9 kg)  Height: 5' (1.524 m)   Body mass index is 21.48 kg/m. Physical Exam Vitals reviewed.  Constitutional:      General: She is not in acute distress. HENT:     Head: Normocephalic and atraumatic.     Mouth/Throat:     Mouth: Mucous membranes are moist.   Eyes:     General:        Right eye: No discharge.        Left eye: No discharge.    Cardiovascular:     Rate and Rhythm: Normal rate and regular rhythm.     Pulses: Normal pulses.     Heart sounds: Murmur heard.  Pulmonary:     Effort:  Pulmonary effort is normal. No respiratory distress.     Breath sounds: Normal breath sounds. No wheezing.  Abdominal:     General: There is no distension.     Palpations: Abdomen is soft.     Tenderness: There is no abdominal tenderness.   Musculoskeletal:     Cervical back: Neck supple.     Right lower leg: Edema present.     Left lower leg: Edema present.     Comments: Right foot drop, BLE non pitting edema   Skin:    General: Skin is warm.  Capillary Refill: Capillary refill takes less than 2 seconds.   Neurological:     General: No focal deficit present.     Mental Status: She is alert. Mental status is at baseline.     Motor: Weakness present.     Gait: Gait abnormal.   Psychiatric:        Mood and Affect: Mood normal.     Labs reviewed: Recent Labs    06/06/23 0000 12/02/23 0000  NA 138 139  K 4.3 3.7  CL 99 100  CO2 21 31*  BUN 33* 31*  CREATININE 1.1 1.1  CALCIUM 9.7 9.3   Recent Labs    06/06/23 0000 12/02/23 0000  AST 15 12*  ALT 7 9  ALKPHOS 71 78  ALBUMIN 3.8 3.7   Recent Labs    06/06/23 0000 12/02/23 0000  WBC 8.4 8.1  NEUTROABS 5,082.00 4,366.00  HGB 12.0 10.8*  HCT 35* 33*  PLT 240 291   Lab Results  Component Value Date   TSH 1.14 06/06/2023   No results found for: HGBA1C Lab Results  Component Value Date   CHOL 175 07/23/2019   HDL 54 07/23/2019   LDLCALC 105 (H) 07/23/2019   TRIG 74 07/23/2019   CHOLHDL 3.2 07/23/2019    Significant Diagnostic Results in last 30 days:  No results found.  Assessment/Plan 1. Adult failure to thrive (Primary) - poor appetite with nausea x 1 week  - 06/11 doxycycline  started for suspected pneumonia - refuses meal supplement shakes - will start Remeron  7.5 mg at bedtime for appetite stimulation - will start Protonix 20 mg po QAM to help with nausea  - dietary consult to discuss meal supplement options> does not want BOOST - cont Zofran  prn  - if condition worsens> recommend  hospice   2. Nausea - see above  3. Leukocytosis, unspecified type -  06/09 WBC 18.5  - CXr noted basilar opacities - 06/11 started on doxycycline  x  days for suspected PNA - nursing unable to obtain urine sample  4. Anxiety - no recent panic  - associated with poor health - cont ativan  prn    Family/ staff Communication: plan discussed with patient, son and nurse  Labs/tests ordered: none

## 2024-03-17 MED ORDER — MIRTAZAPINE 7.5 MG PO TABS: 7.5000 mg | ORAL_TABLET | Freq: Every day | ORAL | Status: DC

## 2024-03-17 MED ORDER — PANTOPRAZOLE SODIUM 20 MG PO TBEC: 20.0000 mg | DELAYED_RELEASE_TABLET | Freq: Every day | ORAL | Status: DC

## 2024-03-20 ENCOUNTER — Non-Acute Institutional Stay (SKILLED_NURSING_FACILITY): Payer: Self-pay | Admitting: Orthopedic Surgery

## 2024-03-20 ENCOUNTER — Encounter: Payer: Self-pay | Admitting: Orthopedic Surgery

## 2024-03-20 DIAGNOSIS — R627 Adult failure to thrive: Secondary | ICD-10-CM

## 2024-03-20 DIAGNOSIS — S61309A Unspecified open wound of unspecified finger with damage to nail, initial encounter: Secondary | ICD-10-CM | POA: Diagnosis not present

## 2024-03-20 NOTE — Progress Notes (Signed)
 Location:  [facility] Room Number: 1/A Place of Service:  SNF [phone]) Provider:  Arnetha Bhat, NP   Marguerite Shiley, MD  Patient Care Team: Marguerite Shiley, MD as PCP - General (Internal Medicine) Doreen Gamma, MD as Attending Physician [facility] Medicine)  Extended Emergency Contact Information Primary Emergency Contact: [facility] Address: [phone]          [place] [address] [place] Home Phone: [phone] Mobile Phone: [phone] Relation: Son Secondary Emergency Contact: [facility] Address: [address]          [place], [place] [address] [place] Home Phone: [phone] Relation: Son  Code Status:  DNR Goals of care: Advanced Directive information    02/12/2024   10:35 AM  Advanced Directives  Does Patient Have a Medical Advance Directive? Yes  Type of Estate agent of [place];Living will;Out of facility DNR (pink MOST or yellow form)  Does patient want to make changes to medical advance directive? No - Patient declined  Copy of Healthcare Power of Attorney in Chart? Yes - validated most recent copy scanned in chart (See row information)  Pre-existing out of facility DNR order (yellow form or pink MOST form) Pink MOST/Yellow Form most recent copy in chart - Physician notified to receive inpatient order     Chief Complaint  Patient presents with   Acute Visit    HPI:  Pt is a 89 y.o. female seen today for acute visit due to nail avulsion.   She currently resides on the skilled nursing unit at [facility]. PMH: hypertension, PVD, osteoarthritis of multiple joints, CKD stage 3, posterior tendon dysfunction with right foot drop, constipation, restless leg syndrome and anxiety.    Left hand ring finger with nail detached and dangling from nail bed. No recent injury. She does get her nail painted occasionally. She denies pain to finger and nail bed. Afebrile. Vitals stable.    06/16 prescribed Protonix and mirtazapine  due to nausea/FOT. She was able to eat [employer] sandwich yesterday. She is trying to drink Boost daily. Also on magic cups. No vomiting. She is taking po medications without difficulty. She has received one dose zofran  prn per chart review. Zofran  was given on 06/19.   06/18 doxycycline  completed for suspected URI/PNA.      Past Medical History:  Diagnosis Date   Advanced care planning/counseling discussion 11/18/2018   Anemia    Closed compression fracture of L5 lumbar vertebra, sequela 09/08/2019   Hypertension    OA (osteoarthritis)    Obesity (BMI 30-39.9) 01/20/2018   Osteoporosis    Renal cyst    UTI (urinary tract infection) 11/28/2020   11/28/20 urine culture, Citrobacter, Keflex 500mg  tid x 7 day.    Weight gain 11/25/2018   Past Surgical History:  Procedure Laterality Date   APPENDECTOMY     BLADDER SUSPENSION     CATARACT EXTRACTION     CESAREAN SECTION     REPLACEMENT TOTAL KNEE  1995   TONSILLECTOMY      No Known Allergies  Outpatient Encounter Medications as of 03/20/2024  Medication Sig   acetaminophen  (TYLENOL ) 325 MG tablet Take 650 mg by mouth every 4 (four) hours as needed.   acetaminophen  (TYLENOL ) 325 MG tablet Take 650 mg by mouth 3 (three) times daily.   aspirin EC 81 MG tablet Take 81 mg by mouth 2 (two) times a week. Swallow whole.   Biotin 5000 MCG TABS  Take 5,000 mcg by mouth daily.    Cholecalciferol (VITAMIN D) 50 MCG (2000 UT) CAPS Take 2,000 Units by mouth daily.    Eyelid Cleansers (OCUSOFT EYELID CLEANSING) PADS Apply 1 Pad topically every morning.   ketoconazole (NIZORAL) 2 % shampoo Apply 1 Application topically 2 (two) times a week. Tuesday & Friday.   LORazepam  (ATIVAN ) 0.5 MG tablet Take 1 tablet (0.5 mg total) by mouth 2 (two) times daily as needed for anxiety.   metolazone  (ZAROXOLYN ) 2.5 MG tablet Take 2.5 mg by mouth 2 (two) times a week. On Wed. And Sat. only   metoprolol  tartrate  (LOPRESSOR ) 25 MG tablet Take 25 mg by mouth every morning.   metoprolol  tartrate (LOPRESSOR ) 50 MG tablet Take 50 mg by mouth every evening.   mirtazapine  (REMERON ) 7.5 MG tablet Take 1 tablet (7.5 mg total) by mouth at bedtime.   multivitamin-lutein (OCUVITE-LUTEIN) CAPS capsule Take 1 capsule by mouth 2 (two) times daily.    nitrofurantoin  (MACRODANTIN ) 50 MG capsule Take 1 capsule (50 mg total) by mouth daily.   ondansetron  (ZOFRAN ) 4 MG tablet Take 1 tablet (4 mg total) by mouth every 6 (six) hours as needed for nausea or vomiting.   pantoprazole (PROTONIX) 20 MG tablet Take 1 tablet (20 mg total) by mouth daily.   phenazopyridine  (PYRIDIUM ) 100 MG tablet Take 100 mg by mouth daily as needed for pain.   polyethylene glycol (MIRALAX / GLYCOLAX) 17 g packet Take 17 g by mouth 3 (three) times a week. Monday, Wednesday, and Friday.   polyethylene glycol (MIRALAX / GLYCOLAX) 17 g packet Take 17 g by mouth as needed.   polyvinyl alcohol (LIQUIFILM TEARS) 1.4 % ophthalmic solution Place 1 drop into both eyes 3 (three) times daily.   Potassium Chloride  ER 20 MEQ TBCR Take 1 tablet by mouth 2 (two) times a week. On Wed. And Sat.   rOPINIRole  (REQUIP ) 1 MG tablet TAKE 1 TABLET EACH EVENING.   traMADol  (ULTRAM ) 50 MG tablet Take 0.5 tablets (25 mg total) by mouth 2 (two) times daily as needed.   white petrolatum (VASELINE) ointment Apply 1 Application topically at bedtime. Apply to vaginal folds topically for dryness.   zinc  oxide 20 % ointment Apply 1 application topically as needed for irritation. Special Instructions: To buttocks after every incontinent episode and as needed for redness. May keep at bedside.   No facility-administered encounter medications on file as of 03/20/2024.    Review of Systems  Constitutional:  Positive for fatigue. Negative for fever.  HENT:  Negative for sore throat and trouble swallowing.   Respiratory:  Negative for cough and shortness of breath.   Cardiovascular:   Positive for leg swelling. Negative for chest pain.  Gastrointestinal:  Positive for constipation and nausea. Negative for abdominal distention, abdominal pain and vomiting.  Musculoskeletal:  Positive for gait problem.  Skin:  Positive for wound.  Neurological:  Positive for weakness. Negative for dizziness and headaches.  Psychiatric/Behavioral:  Positive for confusion and dysphoric mood. Negative for sleep disturbance. The patient is not nervous/anxious.     Immunization History  Administered Date(s) Administered   Fluad Quad(high Dose 65+) 07/25/2022, 07/31/2023   Influenza, High Dose Seasonal PF 07/10/2017, 07/15/2019, 07/05/2020, 07/25/2021   Influenza,inj,Quad PF,6+ Mos 07/03/2018   Influenza-Unspecified 07/04/2015, 07/10/2017   Moderna Covid-19 Seasonal Vaccine 6 months thru 88years of age 09/06/2022   [facility] Covid-19 Vaccine Bivalent Booster 11yrs & up 08/07/2023   Moderna SARS-COV2 Booster Vaccination 03/08/2021, 03/14/2022   Moderna  Sars-Covid-2 Vaccination 10/05/2019, 11/02/2019, 08/15/2020, 06/21/2021   PFIZER(Purple Top)SARS-COV-2 Vaccination 06/21/2021   Pneumococcal Conjugate-13 08/18/2014   Pneumococcal Polysaccharide-23 07/26/2010   RSV,unspecified 08/27/2022   Tdap 11/12/2018   Pertinent  Health Maintenance Due  Topic Date Due   INFLUENZA VACCINE  05/01/2024   DEXA SCAN  Discontinued      08/23/2023    1:48 PM 09/20/2023    2:57 PM 10/23/2023    2:22 PM 11/20/2023    1:51 PM 02/14/2024   10:13 AM  Fall Risk  Falls in the past year? 1 1 1 1  0  Was there an injury with Fall? 1 1 1  0 0  Fall Risk Category Calculator 2 2 2 1  0  Patient at Risk for Falls Due to History of fall(s);Impaired balance/gait;Impaired mobility History of fall(s);Impaired balance/gait;Impaired mobility History of fall(s);Impaired balance/gait;Impaired mobility History of fall(s);Impaired balance/gait;Impaired mobility History of fall(s);Impaired balance/gait  Fall risk Follow up Falls  evaluation completed;Education provided;Falls prevention discussed Falls evaluation completed;Education provided;Falls prevention discussed Falls evaluation completed;Education provided Falls evaluation completed;Education provided Falls evaluation completed;Education provided   Functional Status Survey:    Vitals:   03/20/24 1337  BP: 109/62  Pulse: 90  Resp: 18  Temp: 97.6 F (36.4 C)  SpO2: 93%  Weight: 110 lb (49.9 kg)  Height: 5' (1.524 m)   Body mass index is 21.48 kg/m. Physical Exam Vitals reviewed.  Constitutional:      General: She is not in acute distress. HENT:     Head: Normocephalic.   Eyes:     General:        Right eye: No discharge.        Left eye: No discharge.    Cardiovascular:     Rate and Rhythm: Normal rate and regular rhythm.     Pulses: Normal pulses.     Heart sounds: Murmur heard.  Pulmonary:     Effort: Pulmonary effort is normal. No respiratory distress.     Breath sounds: Normal breath sounds. No wheezing or rales.  Abdominal:     General: There is no distension.     Palpations: Abdomen is soft.     Tenderness: There is no abdominal tenderness.   Musculoskeletal:     Cervical back: Neck supple.     Right lower leg: Edema present.     Left lower leg: Edema present.     Comments: BLE non pitting edema, right foot drop   Skin:    General: Skin is warm.     Capillary Refill: Capillary refill takes less than 2 seconds.     Comments: Left hand ring finger with avulsion, attached to small piece of cuticle, removed with nail scissors, tolerated well   Neurological:     General: No focal deficit present.     Mental Status: She is alert. Mental status is at baseline.     Motor: Weakness present.     Gait: Gait abnormal.   Psychiatric:        Mood and Affect: Mood normal.     Labs reviewed: Recent Labs    06/06/23 0000 12/02/23 0000  NA 138 139  K 4.3 3.7  CL 99 100  CO2 21 31*  BUN 33* 31*  CREATININE 1.1 1.1  CALCIUM  9.7 9.3   Recent Labs    06/06/23 0000 12/02/23 0000  AST 15 12*  ALT 7 9  ALKPHOS 71 78  ALBUMIN 3.8 3.7   Recent Labs    06/06/23 0000 12/02/23  0000  WBC 8.4 8.1  NEUTROABS 5,082.00 4,366.00  HGB 12.0 10.8*  HCT 35* 33*  PLT 240 291   Lab Results  Component Value Date   TSH 1.14 06/06/2023   No results found for: HGBA1C Lab Results  Component Value Date   CHOL 175 07/23/2019   HDL 54 07/23/2019   LDLCALC 105 (H) 07/23/2019   TRIG 74 07/23/2019   CHOLHDL 3.2 07/23/2019    Significant Diagnostic Results in last 30 days:  No results found.  Assessment/Plan 1. Adult failure to thrive (Primary) - ongoing - poor po intake - BMI 21.48 - dietary added Boost daily and magic cups - poor po intake complicated by nausea - 06/16 Protonix and mirtazapine  started - 06/16 zofran  prn given> ate [employer] sandwich  2. Nail avulsion, finger, initial encounter - no injury - noticed on morning assessment - nail removed at bedside without difficulty    Family/ staff Communication: plan discussed with patient and nurse  Labs/tests ordered:  none

## 2024-03-26 ENCOUNTER — Non-Acute Institutional Stay (SKILLED_NURSING_FACILITY): Payer: Self-pay | Admitting: Internal Medicine

## 2024-03-26 DIAGNOSIS — R634 Abnormal weight loss: Secondary | ICD-10-CM

## 2024-03-26 DIAGNOSIS — R630 Anorexia: Secondary | ICD-10-CM | POA: Diagnosis not present

## 2024-03-26 DIAGNOSIS — I959 Hypotension, unspecified: Secondary | ICD-10-CM | POA: Diagnosis not present

## 2024-03-26 NOTE — Progress Notes (Signed)
 [facility] Service:  SNF ([id number])  Provider:   Code Status: DNR/DNH Goals of Care:     02/12/2024   10:35 AM  Advanced Directives  Does Patient Have a Medical Advance Directive? Yes  Type of Estate agent of [place];Living will;Out of facility DNR (pink MOST or yellow form)  Does patient want to make changes to medical advance directive? No - Patient declined  Copy of Healthcare Power of Attorney in Chart? Yes - validated most recent copy scanned in chart (See row information)  Pre-existing out of facility DNR order (yellow form or pink MOST form) Pink MOST/Yellow Form most recent copy in chart - Physician notified to receive inpatient order     Chief Complaint  Patient presents with   Acute Visit    HPI: Patient is a 89 y.o. female seen today for an acute visit for Weakness and Low BP  Lives [facility]N SNF   Patient has h/o Hypertension, Anxiety, Restless leg Syndrome, Insomnia, Arthritis and Posterior Tendon Dysfunction with right foot Valgus in Right Knee with inability to Ambulate     Seen on 06/09 for Lethargy and weakness CBC showed elevated white count of 18K BUN was elevated and creat was 1.5 HGB was 8.1 Chest Xray Bibasilar Opacities  Treated with Doxycyline  But since then she has lost weight  20 lbs in last few months Not eating Feels weak and tired Was started on Remeron  per POA dr Cleotilde Some nausea Denied any Abdominal Pain KUB negative Just general Malaise No SOB or Cough Wt Readings from Last 3 Encounters:  03/26/24 99 lb (44.9 kg)  03/20/24 110 lb (49.9 kg)  03/16/24 110 lb (49.9 kg)     Past Medical History:  Diagnosis Date   Advanced care planning/counseling discussion 11/18/2018   Anemia    Closed compression fracture of L5 lumbar vertebra, sequela 09/08/2019   Hypertension    OA (osteoarthritis)    Obesity (BMI 30-39.9) 01/20/2018   Osteoporosis    Renal cyst    UTI (urinary tract infection)  11/28/2020   11/28/20 urine culture, Citrobacter, Keflex 500mg  tid x 7 day.    Weight gain 11/25/2018    Past Surgical History:  Procedure Laterality Date   APPENDECTOMY     BLADDER SUSPENSION     CATARACT EXTRACTION     CESAREAN SECTION     REPLACEMENT TOTAL KNEE  1995   TONSILLECTOMY      No Known Allergies  Outpatient Encounter Medications as of 03/26/2024  Medication Sig   acetaminophen  (TYLENOL ) 325 MG tablet Take 650 mg by mouth every 4 (four) hours as needed.   acetaminophen  (TYLENOL ) 325 MG tablet Take 650 mg by mouth 3 (three) times daily.   aspirin EC 81 MG tablet Take 81 mg by mouth 2 (two) times a week. Swallow whole.   Biotin 5000 MCG TABS Take 5,000 mcg by mouth daily.    Cholecalciferol (VITAMIN D) 50 MCG (2000 UT) CAPS Take 2,000 Units by mouth daily.    Eyelid Cleansers (OCUSOFT EYELID CLEANSING) PADS Apply 1 Pad topically every morning.   ketoconazole (NIZORAL) 2 % shampoo Apply 1 Application topically 2 (two) times a week. Tuesday & Friday.   LORazepam  (ATIVAN ) 0.5 MG tablet Take 1 tablet (0.5 mg total) by mouth 2 (two) times daily as needed for anxiety.   metolazone  (ZAROXOLYN ) 2.5 MG tablet Take 2.5 mg by mouth 2 (two) times a week. On Wed. And Sat. only  metoprolol  tartrate (LOPRESSOR ) 25 MG tablet Take 25 mg by mouth every morning.   metoprolol  tartrate (LOPRESSOR ) 50 MG tablet Take 50 mg by mouth every evening.   mirtazapine  (REMERON ) 7.5 MG tablet Take 1 tablet (7.5 mg total) by mouth at bedtime.   multivitamin-lutein (OCUVITE-LUTEIN) CAPS capsule Take 1 capsule by mouth 2 (two) times daily.    nitrofurantoin  (MACRODANTIN ) 50 MG capsule Take 1 capsule (50 mg total) by mouth daily.   ondansetron  (ZOFRAN ) 4 MG tablet Take 1 tablet (4 mg total) by mouth every 6 (six) hours as needed for nausea or vomiting.   pantoprazole  (PROTONIX ) 20 MG tablet Take 1 tablet (20 mg total) by mouth daily.   phenazopyridine  (PYRIDIUM ) 100 MG tablet Take 100 mg by mouth daily as  needed for pain.   polyethylene glycol (MIRALAX / GLYCOLAX) 17 g packet Take 17 g by mouth 3 (three) times a week. Monday, Wednesday, and Friday.   polyethylene glycol (MIRALAX / GLYCOLAX) 17 g packet Take 17 g by mouth as needed.   polyvinyl alcohol (LIQUIFILM TEARS) 1.4 % ophthalmic solution Place 1 drop into both eyes 3 (three) times daily.   Potassium Chloride  ER 20 MEQ TBCR Take 1 tablet by mouth 2 (two) times a week. On Wed. And Sat.   rOPINIRole  (REQUIP ) 1 MG tablet TAKE 1 TABLET EACH EVENING.   traMADol  (ULTRAM ) 50 MG tablet Take 0.5 tablets (25 mg total) by mouth 2 (two) times daily as needed.   white petrolatum (VASELINE) ointment Apply 1 Application topically at bedtime. Apply to vaginal folds topically for dryness.   zinc  oxide 20 % ointment Apply 1 application topically as needed for irritation. Special Instructions: To buttocks after every incontinent episode and as needed for redness. May keep at bedside.   No facility-administered encounter medications on file as of 03/26/2024.    Review of Systems:  Review of Systems  Constitutional:  Positive for activity change, appetite change and unexpected weight change.  HENT: Negative.    Respiratory:  Negative for cough and shortness of breath.   Cardiovascular:  Negative for leg swelling.  Gastrointestinal:  Positive for nausea. Negative for constipation.  Genitourinary: Negative.   Musculoskeletal:  Negative for arthralgias, gait problem and myalgias.  Skin: Negative.   Neurological:  Negative for dizziness and weakness.  Psychiatric/Behavioral:  Negative for confusion, dysphoric mood and sleep disturbance.     Health Maintenance  Topic Date Due   Zoster Vaccines- Shingrix (1 of 2) Never done   COVID-19 Vaccine (6 - 2024-25 season) 10/02/2023   INFLUENZA VACCINE  05/01/2024   Medicare Annual Wellness (AWV)  10/22/2024   DTaP/Tdap/Td (2 - Td or Tdap) 11/12/2028   Pneumococcal Vaccine: 50+ Years  Completed   Hepatitis B  Vaccines  Aged Out   HPV VACCINES  Aged Out   Meningococcal B Vaccine  Aged Out   DEXA SCAN  Discontinued    Physical Exam: Vitals:   03/26/24 0848  BP: (!) 88/54  Pulse: 73  Resp: 18  Temp: (!) 96.2 F (35.7 C)  Weight: 99 lb (44.9 kg)   Body mass index is 19.33 kg/m. Physical Exam Vitals reviewed.  Constitutional:      Comments: Frail  HENT:     Head: Normocephalic.     Nose: Nose normal.     Mouth/Throat:     Mouth: Mucous membranes are moist.     Pharynx: Oropharynx is clear.   Eyes:     Pupils: Pupils are equal, round, and  reactive to light.    Cardiovascular:     Rate and Rhythm: Normal rate and regular rhythm.     Pulses: Normal pulses.     Heart sounds: Normal heart sounds.  Pulmonary:     Effort: Pulmonary effort is normal.     Breath sounds: Normal breath sounds.  Abdominal:     General: Abdomen is flat. Bowel sounds are normal.     Palpations: Abdomen is soft.   Musculoskeletal:        General: No swelling.     Cervical back: Neck supple.   Skin:    General: Skin is warm.   Neurological:     General: No focal deficit present.     Mental Status: She is alert.   Psychiatric:        Mood and Affect: Mood normal.        Thought Content: Thought content normal.     Labs reviewed: Basic Metabolic Panel: Recent Labs    06/06/23 0000 12/02/23 0000  NA 138 139  K 4.3 3.7  CL 99 100  CO2 21 31*  BUN 33* 31*  CREATININE 1.1 1.1  CALCIUM 9.7 9.3  TSH 1.14  --    Liver Function Tests: Recent Labs    06/06/23 0000 12/02/23 0000  AST 15 12*  ALT 7 9  ALKPHOS 71 78  ALBUMIN 3.8 3.7   No results for input(s): LIPASE, AMYLASE in the last 8760 hours. No results for input(s): AMMONIA in the last 8760 hours. CBC: Recent Labs    06/06/23 0000 12/02/23 0000  WBC 8.4 8.1  NEUTROABS 5,082.00 4,366.00  HGB 12.0 10.8*  HCT 35* 33*  PLT 240 291   Lipid Panel: No results for input(s): CHOL, HDL, LDLCALC, TRIG, CHOLHDL,  LDLDIRECT in the last 8760 hours. No results found for: HGBA1C  Procedures since last visit: No results found.  Assessment/Plan 1. Weight loss (Primary) Discussed with Dr Cleotilde Patient has lost 10 lbs in past few weeks and 20 in past few months I am discontinuing her meds Made hospice referral   2. Hypotension, unspecified hypotension type Discontinue Metolazone  and Metoprolol   3. Lack of appetite Right now discontinue Remeron  It could be why she is more sleepy    Labs/tests ordered:  * No order type specified * Next appt:  Visit date not found

## 2024-04-20 ENCOUNTER — Other Ambulatory Visit: Payer: Self-pay | Admitting: Orthopedic Surgery

## 2024-04-20 MED ORDER — ACETAMINOPHEN 160 MG/5ML PO LIQD: ORAL | Status: DC

## 2024-04-24 ENCOUNTER — Encounter: Payer: Self-pay | Admitting: Orthopedic Surgery

## 2024-04-24 ENCOUNTER — Non-Acute Institutional Stay (SKILLED_NURSING_FACILITY): Payer: Self-pay | Admitting: Orthopedic Surgery

## 2024-04-24 DIAGNOSIS — R110 Nausea: Secondary | ICD-10-CM | POA: Diagnosis not present

## 2024-04-24 DIAGNOSIS — I959 Hypotension, unspecified: Secondary | ICD-10-CM

## 2024-04-24 DIAGNOSIS — F419 Anxiety disorder, unspecified: Secondary | ICD-10-CM | POA: Diagnosis not present

## 2024-04-24 DIAGNOSIS — S51811A Laceration without foreign body of right forearm, initial encounter: Secondary | ICD-10-CM

## 2024-04-24 DIAGNOSIS — R627 Adult failure to thrive: Secondary | ICD-10-CM

## 2024-04-24 NOTE — Progress Notes (Signed)
 Location:  [facility] Room Number: 1 A Place of Service:  SNF [phone]) Provider:  Greig Cluster, NP   Charlanne Fredia CROME, MD  Patient Care Team: Charlanne Fredia CROME, MD as PCP - General (Internal Medicine) Cleotilde Garnette HERO, MD as Attending Physician (Family Medicine)  Extended Emergency Contact Information Primary Emergency Contact: [facility] Address: [phone]          [place] [address] [place] Home Phone: [phone] Mobile Phone: [phone] Relation: Son Secondary Emergency Contact: [facility] Address: [address]          [place], [place] [address] [place] Home Phone: [phone] Relation: Son  Code Status:  Hospice Goals of care: Advanced Directive information    02/12/2024   10:35 AM  Advanced Directives  Does Patient Have a Medical Advance Directive? Yes  Type of Estate agent of [place];Living will;Out of facility DNR (pink MOST or yellow form)  Does patient want to make changes to medical advance directive? No - Patient declined  Copy of Healthcare Power of Attorney in Chart? Yes - validated most recent copy scanned in chart (See row information)  Pre-existing out of facility DNR order (yellow form or pink MOST form) Pink MOST/Yellow Form most recent copy in chart - Physician notified to receive inpatient order     Chief Complaint  Patient presents with   Routine Visit    HPI:  Pt is a 89 y.o. female seen today for medical management of chronic diseases.    She currently resides on the skilled nursing unit at [facility]. PMH: hypertension, PVD, osteoarthritis of multiple joints, CKD stage 3, posterior tendon dysfunction with right foot drop, constipation, restless leg syndrome and anxiety.    Adult failure to thrive- > 20 lb weight loss within 8 weeks, followed by hospice, unsuccessful trial mirtazapine , remains on Boost and Prostat Hypotension- not on antihypertensives  or diuretics Nausea- remains on zofran  prn Anxiety- per nursing she has stated  what's wrong with me,  she has also called out for mother a few times, 07/24 Ativan  changed to scheduled Skin tear right forearm- noted 07/25, unclear how injury occurred, wound has been evaluated by wound care nurse  Appears over the weekend she was placed on 2 liters oxygen. O2 sats 96% on room air. Oxygen discontinued.   Recent weights:  07/09- 99 lbs  06/04- 114.9 lbs  05/01- 122.1 lbs  Recent blood pressures:     Past Medical History:  Diagnosis Date   Advanced care planning/counseling discussion 11/18/2018   Anemia    Closed compression fracture of L5 lumbar vertebra, sequela 09/08/2019   Hypertension    OA (osteoarthritis)    Obesity (BMI 30-39.9) 01/20/2018   Osteoporosis    Renal cyst    UTI (urinary tract infection) 11/28/2020   11/28/20 urine culture, Citrobacter, Keflex 500mg  tid x 7 day.    Weight gain 11/25/2018   Past Surgical History:  Procedure Laterality Date   APPENDECTOMY     BLADDER SUSPENSION     CATARACT EXTRACTION     CESAREAN SECTION     REPLACEMENT TOTAL KNEE  1995   TONSILLECTOMY      No Known Allergies  Outpatient Encounter Medications as of 04/24/2024  Medication Sig   acetaminophen  (TYLENOL ) 160 MG/5ML liquid Take 20.3 mLs (650 mg total) by mouth in the morning, at noon, and at bedtime. May also take 20.3 mLs (650 mg total) 2 (two) times daily  as needed for fever.   AMINO ACIDS-PROTEIN HYDROLYS PO Take 30 mLs by mouth daily.   Eyelid Cleansers (OCUSOFT EYELID CLEANSING) PADS Apply 1 Pad topically every morning.   ketoconazole (NIZORAL) 2 % shampoo Apply 1 Application topically 2 (two) times a week. Tuesday & Friday.   LORazepam  (ATIVAN ) 0.5 MG tablet Take 0.5 mg by mouth every 12 (twelve) hours as needed for anxiety (Give 0.25 mg by mouth every 12 hours as needed for anxiety).   ondansetron  (ZOFRAN ) 4 MG tablet Take 1 tablet (4 mg total) by mouth every 6 (six)  hours as needed for nausea or vomiting.   pantoprazole  (PROTONIX ) 20 MG tablet Take 1 tablet (20 mg total) by mouth daily.   phenazopyridine  (PYRIDIUM ) 100 MG tablet Take 100 mg by mouth daily as needed for pain.   polyethylene glycol (MIRALAX / GLYCOLAX) 17 g packet Take 17 g by mouth 3 (three) times a week. Monday, Wednesday, and Friday.   polyethylene glycol (MIRALAX / GLYCOLAX) 17 g packet Take 17 g by mouth as needed.   polyvinyl alcohol (LIQUIFILM TEARS) 1.4 % ophthalmic solution Place 1 drop into both eyes 3 (three) times daily.   rOPINIRole  (REQUIP ) 1 MG tablet TAKE 1 TABLET EACH EVENING.   white petrolatum (VASELINE) ointment Apply 1 Application topically at bedtime. Apply to vaginal folds topically for dryness.   zinc  oxide 20 % ointment Apply 1 application topically as needed for irritation. Special Instructions: To buttocks after every incontinent episode and as needed for redness. May keep at bedside.   No facility-administered encounter medications on file as of 04/24/2024.    Review of Systems  Unable to perform ROS: Age    Immunization History  Administered Date(s) Administered   Fluad Quad(high Dose 65+) 07/25/2022, 07/31/2023   Influenza, High Dose Seasonal PF 07/10/2017, 07/15/2019, 07/05/2020, 07/25/2021   Influenza,inj,Quad PF,6+ Mos 07/03/2018   Influenza-Unspecified 07/04/2015, 07/10/2017   Moderna Covid-19 Seasonal Vaccine 6 months thru 88years of age 61/03/2022   [facility] Covid-19 Vaccine Bivalent Booster 26yrs & up 08/07/2023   Moderna SARS-COV2 Booster Vaccination 03/08/2021, 03/14/2022   Moderna Sars-Covid-2 Vaccination 10/05/2019, 11/02/2019, 08/15/2020, 06/21/2021   PFIZER(Purple Top)SARS-COV-2 Vaccination 06/21/2021   Pneumococcal Conjugate-13 08/18/2014   Pneumococcal Polysaccharide-23 07/26/2010   RSV,unspecified 08/27/2022   Tdap 11/12/2018   Pertinent  Health Maintenance Due  Topic Date Due   INFLUENZA VACCINE  05/01/2024   DEXA SCAN  Discontinued       08/23/2023    1:48 PM 09/20/2023    2:57 PM 10/23/2023    2:22 PM 11/20/2023    1:51 PM 02/14/2024   10:13 AM  Fall Risk  Falls in the past year? 1 1 1 1  0  Was there an injury with Fall? 1 1 1  0 0  Fall Risk Category Calculator 2 2 2 1  0  Patient at Risk for Falls Due to History of fall(s);Impaired balance/gait;Impaired mobility History of fall(s);Impaired balance/gait;Impaired mobility History of fall(s);Impaired balance/gait;Impaired mobility History of fall(s);Impaired balance/gait;Impaired mobility History of fall(s);Impaired balance/gait  Fall risk Follow up Falls evaluation completed;Education provided;Falls prevention discussed Falls evaluation completed;Education provided;Falls prevention discussed Falls evaluation completed;Education provided Falls evaluation completed;Education provided Falls evaluation completed;Education provided   Functional Status Survey:    Vitals:   04/24/24 0959  BP: 94/60  Pulse: 94  Resp: 20  Temp: 97.9 F (36.6 C)  SpO2: 92%  Weight: 99 lb (44.9 kg)  Height: 5' (1.524 m)   Body mass index is 19.33 kg/m. Physical Exam Vitals reviewed.  Constitutional:      General: She is not in acute distress.    Appearance: She is cachectic.  HENT:     Head: Normocephalic.     Ears:     Comments: Bilateral hearing aids    Nose: Nose normal.     Mouth/Throat:     Mouth: Mucous membranes are moist.  Eyes:     General:        Right eye: No discharge.        Left eye: No discharge.  Cardiovascular:     Rate and Rhythm: Normal rate and regular rhythm.     Pulses: Normal pulses.     Heart sounds: Normal heart sounds.  Pulmonary:     Effort: Pulmonary effort is normal. No respiratory distress.     Breath sounds: Normal breath sounds. No wheezing or rales.  Abdominal:     General: Bowel sounds are normal. There is no distension.     Palpations: Abdomen is soft.     Tenderness: There is no abdominal tenderness.  Musculoskeletal:     Cervical  back: Neck supple.     Right lower leg: No edema.     Left lower leg: No edema.  Skin:    General: Skin is warm.     Capillary Refill: Capillary refill takes less than 2 seconds.     Comments: Approx 0.5 cm skin tear to right anterior forearm, CDI, no sign of infection  Neurological:     General: No focal deficit present.     Mental Status: She is alert. Mental status is at baseline.     Gait: Gait abnormal.  Psychiatric:        Mood and Affect: Mood normal.     Comments: Alert to self/person, follows commands     Labs reviewed: Recent Labs    06/06/23 0000 12/02/23 0000 03/09/24 0000  NA 138 139 136*  K 4.3 3.7 3.7  CL 99 100 101  CO2 21 31* 25*  BUN 33* 31* 39*  CREATININE 1.1 1.1 1.6*  CALCIUM 9.7 9.3 8.4*   Recent Labs    06/06/23 0000 12/02/23 0000 03/09/24 0000  AST 15 12* 10*  ALT 7 9 7   ALKPHOS 71 78 67  ALBUMIN 3.8 3.7 3.1*   Recent Labs    06/06/23 0000 12/02/23 0000 03/09/24 0000  WBC 8.4 8.1 18.5  NEUTROABS 5,082.00 4,366.00 15,633.00  HGB 12.0 10.8* 8.1*  HCT 35* 33* 25*  PLT 240 291 305   Lab Results  Component Value Date   TSH 1.14 06/06/2023   No results found for: HGBA1C Lab Results  Component Value Date   CHOL 175 07/23/2019   HDL 54 07/23/2019   LDLCALC 105 (H) 07/23/2019   TRIG 74 07/23/2019   CHOLHDL 3.2 07/23/2019    Significant Diagnostic Results in last 30 days:  No results found.  Assessment/Plan 1. Adult failure to thrive (Primary) - BMI 19.33, cachexia to temples - 07/01 weight 99 lbs - followed by hospice - unsuccessful trial Remeron  - cont Boost and Prostat  2. Nausea - intermittent - related to poor po intake - cont Zofran  prn and protonix   3. Skin tear of right forearm without complication, initial encounter - noted 07/24 - cleanse with saline and cover with Telfa every 3 days or soiled  4. Anxiety - intermittent anxiety  - 07/24 ativan  scheduled  5. Hypotension, unspecified hypotension type -  SBP averaging 90-100's - off antihypertensive and diuretics  Family/ staff Communication: plan discussed with patient and nurse  Labs/tests ordered:  none

## 2024-04-27 ENCOUNTER — Encounter: Payer: Self-pay | Admitting: Orthopedic Surgery

## 2024-05-13 ENCOUNTER — Non-Acute Institutional Stay (SKILLED_NURSING_FACILITY): Payer: Self-pay | Admitting: Orthopedic Surgery

## 2024-05-13 ENCOUNTER — Encounter: Payer: Self-pay | Admitting: Orthopedic Surgery

## 2024-05-13 DIAGNOSIS — R110 Nausea: Secondary | ICD-10-CM | POA: Diagnosis not present

## 2024-05-13 DIAGNOSIS — R52 Pain, unspecified: Secondary | ICD-10-CM

## 2024-05-13 MED ORDER — LORAZEPAM 0.5 MG PO TABS: 0.5000 mg | ORAL_TABLET | Freq: Two times a day (BID) | ORAL | Status: DC | PRN

## 2024-05-13 MED ORDER — ONDANSETRON HCL 4 MG PO TABS: 4.0000 mg | ORAL_TABLET | Freq: Three times a day (TID) | ORAL | Status: DC | PRN

## 2024-05-13 NOTE — Progress Notes (Signed)
 Location: [facility] Room Number: N01-A Place of Service:  SNF [phone]) Provider:  Gil No NP   Charlanne Fredia CROME, MD  Patient Care Team: Charlanne Fredia CROME, MD as PCP - General (Internal Medicine) Cleotilde Garnette HERO, MD as Attending Physician [facility])  Extended Emergency Contact Information Primary Emergency Contact: [facility] Address: [phone]          [place] [address] [place] Home Phone: [phone] Mobile Phone: [phone] Relation: Son Secondary Emergency Contact: [facility] Address: [address]          [place], [place] [address] [place] Home Phone: [phone] Relation: Son  Code Status:  DNR Goals of care: Advanced Directive information    05/13/2024   10:10 AM  Advanced Directives  Does Patient Have a Medical Advance Directive? No;Yes  Type of Estate agent of [place];Out of facility DNR (pink MOST or yellow form)  Does patient want to make changes to medical advance directive? No - Patient declined  Copy of Healthcare Power of Attorney in Chart? Yes - validated most recent copy scanned in chart (See row information)     Chief Complaint  Patient presents with   Acute Visit    Generalized Pain     HPI:  Pt is a 89 y.o. female seen today for an acute visit for generalized pain.   She currently resides on the skilled nursing unit at [facility]. PMH: hypertension, PVD, osteoarthritis of multiple joints, CKD stage 3, posterior tendon dysfunction with right foot drop, constipation, restless leg syndrome and anxiety.    Followed by hospice due to adult failure to thrive/poor appetite. She is now bed bound. Meal consumption varies. Sometimes eating 25-50% of meals. She c/o increased generalized pain in bed. She was taking scheduled tylenol , but she is reporting it is not working. Afebrile. Vitals stable.    Past Medical History:  Diagnosis Date   Advanced  care planning/counseling discussion 11/18/2018   Anemia    Closed compression fracture of L5 lumbar vertebra, sequela 09/08/2019   Hypertension    OA (osteoarthritis)    Obesity (BMI 30-39.9) 01/20/2018   Osteoporosis    Renal cyst    UTI (urinary tract infection) 11/28/2020   11/28/20 urine culture, Citrobacter, Keflex 500mg  tid x 7 day.    Weight gain 11/25/2018   Past Surgical History:  Procedure Laterality Date   APPENDECTOMY     BLADDER SUSPENSION     CATARACT EXTRACTION     CESAREAN SECTION     REPLACEMENT TOTAL KNEE  1995   TONSILLECTOMY      No Known Allergies  Allergies as of 05/13/2024   No Known Allergies      Medication List        Accurate as of May 13, 2024 10:14 AM. If you have any questions, ask your nurse or doctor.          acetaminophen  160 MG/5ML liquid Commonly known as: TYLENOL  Take 20.3 mLs (650 mg total) by mouth in the morning, at noon, and at bedtime. May also take 20.3 mLs (650 mg total) 2 (two) times daily as needed for fever.   AMINO ACIDS-PROTEIN HYDROLYS PO Take 30 mLs by mouth daily.   artificial tears ophthalmic solution Place 1 drop into both eyes 3 (three) times daily.   ketoconazole 2 % shampoo Commonly known as: NIZORAL Apply 1 Application topically 2 (two) times a week. Tuesday & Friday.   LORazepam   0.5 MG tablet Commonly known as: ATIVAN  Take 0.5 mg by mouth 2 (two) times daily. What changed:  when to take this reasons to take this   Milk of Magnesia 2400 MG/30ML suspension Generic drug: magnesium hydroxide Take 30 mLs by mouth. Give 30 ml by mouth every 24 hours as needed for constipation   OcuSoft Eyelid Cleansing Pads Apply 1 Pad topically every morning.   ondansetron  4 MG tablet Commonly known as: Zofran  Take 1 tablet (4 mg total) by mouth every 6 (six) hours as needed for nausea or vomiting. What changed: when to take this   pantoprazole  20 MG tablet Commonly known as: Protonix  Take 1 tablet (20 mg  total) by mouth daily.   phenazopyridine  100 MG tablet Commonly known as: PYRIDIUM  Take 100 mg by mouth daily as needed for pain.   polyethylene glycol 17 g packet Commonly known as: MIRALAX / GLYCOLAX Take 17 g by mouth 3 (three) times a week. Monday, Wednesday, and Friday.   polyethylene glycol 17 g packet Commonly known as: MIRALAX / GLYCOLAX Take 17 g by mouth as needed.   rOPINIRole  1 MG tablet Commonly known as: REQUIP  TAKE 1 TABLET EACH EVENING.   traMADol  HCl 25 MG Tabs Take 25 mg by mouth 3 (three) times daily. G89.1 related to acute pain   Vaseline ointment Generic drug: white petrolatum Apply 1 Application topically at bedtime. Apply to vaginal folds topically for dryness.   zinc  oxide 20 % ointment Apply 1 application topically as needed for irritation. Special Instructions: To buttocks after every incontinent episode and as needed for redness. May keep at bedside.        Review of Systems  Unable to perform ROS: Age    Immunization History  Administered Date(s) Administered   Fluad Quad(high Dose 65+) 07/25/2022, 07/31/2023   Influenza, High Dose Seasonal PF 07/10/2017, 07/15/2019, 07/05/2020, 07/25/2021   Influenza,inj,Quad PF,6+ Mos 07/03/2018   Influenza-Unspecified 07/04/2015, 07/10/2017   Moderna Covid-19 Seasonal Vaccine 6 months thru 88years of age 11/07/2021   [facility] Covid-19 Vaccine Bivalent Booster 49yrs & up 08/07/2023   Moderna SARS-COV2 Booster Vaccination 03/08/2021, 03/14/2022   Moderna Sars-Covid-2 Vaccination 10/05/2019, 11/02/2019, 08/15/2020, 06/21/2021   PFIZER(Purple Top)SARS-COV-2 Vaccination 06/21/2021   Pneumococcal Conjugate-13 08/18/2014   Pneumococcal Polysaccharide-23 07/26/2010   RSV,unspecified 08/27/2022   Tdap 11/12/2018   Pertinent  Health Maintenance Due  Topic Date Due   INFLUENZA VACCINE  05/01/2024   DEXA SCAN  Discontinued      08/23/2023    1:48 PM 09/20/2023    2:57 PM 10/23/2023    2:22 PM 11/20/2023     1:51 PM 02/14/2024   10:13 AM  Fall Risk  Falls in the past year? 1 1 1 1  0  Was there an injury with Fall? 1 1 1  0 0  Fall Risk Category Calculator 2 2 2 1  0  Patient at Risk for Falls Due to History of fall(s);Impaired balance/gait;Impaired mobility History of fall(s);Impaired balance/gait;Impaired mobility History of fall(s);Impaired balance/gait;Impaired mobility History of fall(s);Impaired balance/gait;Impaired mobility History of fall(s);Impaired balance/gait  Fall risk Follow up Falls evaluation completed;Education provided;Falls prevention discussed Falls evaluation completed;Education provided;Falls prevention discussed Falls evaluation completed;Education provided Falls evaluation completed;Education provided Falls evaluation completed;Education provided   Functional Status Survey:    Vitals:   05/13/24 0953  BP: (!) 104/58  Pulse: 78  Resp: 18  Temp: (!) 97 F (36.1 C)  SpO2: 94%  Weight: 92 lb 6.4 oz (41.9 kg)  Height: 5' (1.524 m)  Body mass index is 18.05 kg/m. Physical Exam Vitals reviewed.  Constitutional:      Appearance: She is cachectic.  HENT:     Head: Normocephalic.  Eyes:     General:        Right eye: No discharge.        Left eye: No discharge.  Cardiovascular:     Rate and Rhythm: Normal rate and regular rhythm.     Pulses: Normal pulses.     Heart sounds: Normal heart sounds.  Pulmonary:     Effort: Pulmonary effort is normal.     Breath sounds: Normal breath sounds.  Abdominal:     Palpations: Abdomen is soft.  Musculoskeletal:     Cervical back: Neck supple.     Right lower leg: No edema.     Left lower leg: No edema.  Skin:    General: Skin is warm.     Capillary Refill: Capillary refill takes less than 2 seconds.  Neurological:     General: No focal deficit present.     Mental Status: She is alert and easily aroused.     Motor: Weakness present.     Gait: Gait abnormal.  Psychiatric:        Mood and Affect: Mood normal.      Labs reviewed: Recent Labs    06/06/23 0000 12/02/23 0000 03/09/24 0000  NA 138 139 136*  K 4.3 3.7 3.7  CL 99 100 101  CO2 21 31* 25*  BUN 33* 31* 39*  CREATININE 1.1 1.1 1.6*  CALCIUM 9.7 9.3 8.4*   Recent Labs    06/06/23 0000 12/02/23 0000 03/09/24 0000  AST 15 12* 10*  ALT 7 9 7   ALKPHOS 71 78 67  ALBUMIN 3.8 3.7 3.1*   Recent Labs    06/06/23 0000 12/02/23 0000 03/09/24 0000  WBC 8.4 8.1 18.5  NEUTROABS 5,082.00 4,366.00 15,633.00  HGB 12.0 10.8* 8.1*  HCT 35* 33* 25*  PLT 240 291 305   Lab Results  Component Value Date   TSH 1.14 06/06/2023   No results found for: HGBA1C Lab Results  Component Value Date   CHOL 175 07/23/2019   HDL 54 07/23/2019   LDLCALC 105 (H) 07/23/2019   TRIG 74 07/23/2019   CHOLHDL 3.2 07/23/2019    Significant Diagnostic Results in last 30 days:  No results found.  Assessment/Plan: 1. Generalized pain (Primary) - followed by hospice for FOT - scheduled tylenol  not helping with generalized pain - start Tramadol  25 mg po TID> crush with applesauce  2. Nausea - ondansetron  (ZOFRAN ) 4 MG tablet; Take 1 tablet (4 mg total) by mouth every 8 (eight) hours as needed for nausea or vomiting.     Family/ staff Communication: plan discussed with nurse  Labs/tests ordered: none

## 2024-05-18 ENCOUNTER — Encounter: Payer: Self-pay | Admitting: Orthopedic Surgery

## 2024-05-18 ENCOUNTER — Non-Acute Institutional Stay (SKILLED_NURSING_FACILITY): Payer: Self-pay | Admitting: Orthopedic Surgery

## 2024-05-18 DIAGNOSIS — Z515 Encounter for palliative care: Secondary | ICD-10-CM

## 2024-05-18 MED ORDER — MORPHINE SULFATE (CONCENTRATE) 20 MG/ML PO SOLN: ORAL | Status: DC

## 2024-05-20 ENCOUNTER — Telehealth: Payer: Self-pay | Admitting: Orthopedic Surgery

## 2024-05-20 NOTE — Telephone Encounter (Signed)
 Please advise, see message below....  Copied from CRM [id number]. Topic: General - Deceased Patient >> 05/29/2024  4:19 PM Zane F wrote: Name of caller: Torrence Pinal  The provider, Greig Cluster, filled out the patient's time of death and date of death as approximate. This is the mother of Dr. Garnette Pinal. Please utilize information listed below for correction.  Date of death: 05-27-24  Time of Death: 11:35 AM   Name of funeral home: [employer] number of funeral home: [phone]  Provider that needs to sign form: Amy Cluster  Timeline for signing: As soon as possible

## 2024-05-21 NOTE — Telephone Encounter (Signed)
 Spoke with Darice at [employer] [facility] and she is aware death certificate is complete

## 2024-05-21 NOTE — Telephone Encounter (Signed)
 Death certificate has been resubmitted.

## 2024-06-01 NOTE — Progress Notes (Signed)
 Location:   [facility] Room Number: 1-A Place of Service:  SNF [phone]) Provider:  Greig Cluster, NP  PCP: Charlanne Fredia CROME, MD  Patient Care Team: Charlanne Fredia CROME, MD as PCP - General (Internal Medicine) Cleotilde Garnette HERO, MD as Attending Physician [facility] Medicine)  Extended Emergency Contact Information Primary Emergency Contact: [facility] Address: [phone]          [place] [address] [place] Home Phone: [phone] Mobile Phone: [phone] Relation: Son Secondary Emergency Contact: [facility] Address: [address]          [place], [place] [address] [place] Home Phone: [phone] Relation: Son  Code Status:  DNR Goals of care: Advanced Directive information    05/14/2024   10:20 AM  Advanced Directives  Does Patient Have a Medical Advance Directive? Yes  Type of Estate agent of [place];Living will;Out of facility DNR (pink MOST or yellow form)  Does patient want to make changes to medical advance directive? No - Patient declined  Copy of Healthcare Power of Attorney in Chart? Yes - validated most recent copy scanned in chart (See row information)     Chief Complaint  Patient presents with   Acute Visit    End of life    HPI:  Pt is a 89 y.o. female seen today for acute visit due to end of life care.   She currently resides on the skilled nursing unit at [facility]. PMH: hypertension, PVD, osteoarthritis of multiple joints, CKD stage 3, posterior tendon dysfunction with right foot drop, constipation, restless leg syndrome and anxiety.    Over the weekend she had trouble swallowing pills. She also had increased confusion when awake. Respiration rate 30. Goals of care comfort. Plan to start morphine  concentrate.     Past Medical History:  Diagnosis Date   Advanced care planning/counseling discussion 11/18/2018   Anemia    Closed compression fracture of L5 lumbar  vertebra, sequela 09/08/2019   Hypertension    OA (osteoarthritis)    Obesity (BMI 30-39.9) 01/20/2018   Osteoporosis    Renal cyst    UTI (urinary tract infection) 11/28/2020   11/28/20 urine culture, Citrobacter, Keflex 500mg  tid x 7 day.    Weight gain 11/25/2018   Past Surgical History:  Procedure Laterality Date   APPENDECTOMY     BLADDER SUSPENSION     CATARACT EXTRACTION     CESAREAN SECTION     REPLACEMENT TOTAL KNEE  1995   TONSILLECTOMY      No Known Allergies  Allergies as of 05/26/2024   No Known Allergies      Medication List        Accurate as of May 18, 2024 10:21 AM. If you have any questions, ask your nurse or doctor.          acetaminophen  325 MG tablet Commonly known as: TYLENOL  Take 650 mg by mouth every 4 (four) hours as needed.   acetaminophen  160 MG/5ML liquid Commonly known as: TYLENOL  Take 20.3 mLs (650 mg total) by mouth in the morning, at noon, and at bedtime. May also take 20.3 mLs (650 mg total) 2 (two) times daily as needed for fever.   AMINO ACIDS-PROTEIN HYDROLYS PO Take 30 mLs by mouth daily.   artificial tears ophthalmic solution Place 1 drop into both eyes 3 (three) times daily.   ketoconazole 2 % shampoo Commonly known as: NIZORAL Apply 1 Application topically 2 (two)  times a week. Tuesday & Friday.   LORazepam  0.5 MG tablet Commonly known as: ATIVAN  Take 0.25 mg by mouth 2 (two) times daily.   LORazepam  0.5 MG tablet Commonly known as: ATIVAN  Take 1 tablet (0.5 mg total) by mouth every 12 (twelve) hours as needed for anxiety.   Milk of Magnesia 2400 MG/30ML suspension Generic drug: magnesium hydroxide Take 30 mLs by mouth as needed.   OcuSoft Eyelid Cleansing Pads Apply 1 Pad topically every morning.   ondansetron  4 MG tablet Commonly known as: Zofran  Take 1 tablet (4 mg total) by mouth every 8 (eight) hours as needed for nausea or vomiting.   pantoprazole  20 MG tablet Commonly known as: Protonix  Take 1  tablet (20 mg total) by mouth daily.   phenazopyridine  100 MG tablet Commonly known as: PYRIDIUM  Take 100 mg by mouth daily as needed for pain.   polyethylene glycol 17 g packet Commonly known as: MIRALAX / GLYCOLAX Take 17 g by mouth as needed.   rOPINIRole  1 MG tablet Commonly known as: REQUIP  TAKE 1 TABLET EACH EVENING.   traMADol  HCl 25 MG Tabs Take 25 mg by mouth 3 (three) times daily. G89.1 related to acute pain   Vaseline ointment Generic drug: white petrolatum Apply 1 Application topically at bedtime. Apply to vaginal folds topically for dryness.   zinc  oxide 20 % ointment Apply 1 application topically as needed for irritation. Special Instructions: To buttocks after every incontinent episode and as needed for redness. May keep at bedside.        Review of Systems  Unable to perform ROS: Patient unresponsive    Immunization History  Administered Date(s) Administered   Fluad Quad(high Dose 65+) 07/25/2022, 07/31/2023   Influenza, High Dose Seasonal PF 07/10/2017, 07/15/2019, 07/05/2020, 07/25/2021   Influenza,inj,Quad PF,6+ Mos 07/03/2018   Influenza-Unspecified 07/04/2015, 07/10/2017   Moderna Covid-19 Seasonal Vaccine 6 months thru 88years of age 63/03/2022   [facility] Covid-19 Vaccine Bivalent Booster 25yrs & up 08/07/2023   Moderna SARS-COV2 Booster Vaccination 03/08/2021, 03/14/2022   Moderna Sars-Covid-2 Vaccination 10/05/2019, 11/02/2019, 08/15/2020, 06/21/2021   PFIZER(Purple Top)SARS-COV-2 Vaccination 06/21/2021   Pneumococcal Conjugate-13 08/18/2014   Pneumococcal Polysaccharide-23 07/26/2010   RSV,unspecified 08/27/2022   Tdap 11/12/2018   Pertinent  Health Maintenance Due  Topic Date Due   INFLUENZA VACCINE  05/01/2024   DEXA SCAN  Discontinued      09/20/2023    2:57 PM 10/23/2023    2:22 PM 11/20/2023    1:51 PM 02/14/2024   10:13 AM 05/13/2024   12:43 PM  Fall Risk  Falls in the past year? 1 1 1  0 1  Was there an injury with Fall? 1 1 0 0  0  Fall Risk Category Calculator 2 2 1  0 1  Patient at Risk for Falls Due to History of fall(s);Impaired balance/gait;Impaired mobility History of fall(s);Impaired balance/gait;Impaired mobility History of fall(s);Impaired balance/gait;Impaired mobility History of fall(s);Impaired balance/gait History of fall(s);Impaired balance/gait  Fall risk Follow up Falls evaluation completed;Education provided;Falls prevention discussed Falls evaluation completed;Education provided Falls evaluation completed;Education provided Falls evaluation completed;Education provided Falls evaluation completed;Education provided   Functional Status Survey:    Vitals:   05/28/2024 1013  BP: 116/70  Pulse: 94  Resp: 18  Temp: 97.8 F (36.6 C)  SpO2: 97%  Weight: 92 lb 6.4 oz (41.9 kg)  Height: 5' (1.524 m)   Body mass index is 18.05 kg/m. Physical Exam Vitals reviewed.  Constitutional:      General: She is not in acute  distress. HENT:     Head: Normocephalic.  Eyes:     General:        Right eye: No discharge.        Left eye: No discharge.  Cardiovascular:     Rate and Rhythm: Normal rate and regular rhythm.     Pulses: Normal pulses.     Heart sounds: Murmur heard.  Pulmonary:     Effort: Respiratory distress present.     Breath sounds: Stridor present.  Abdominal:     Palpations: Abdomen is soft.  Musculoskeletal:     Right lower leg: Edema present.     Left lower leg: Edema present.  Skin:    General: Skin is warm.     Capillary Refill: Capillary refill takes less than 2 seconds.  Neurological:     General: No focal deficit present.     Mental Status: She is lethargic.     Gait: Gait abnormal.  Psychiatric:        Mood and Affect: Mood normal.     Labs reviewed: Recent Labs    06/06/23 0000 12/02/23 0000 03/09/24 0000  NA 138 139 136*  K 4.3 3.7 3.7  CL 99 100 101  CO2 21 31* 25*  BUN 33* 31* 39*  CREATININE 1.1 1.1 1.6*  CALCIUM 9.7 9.3 8.4*   Recent Labs     06/06/23 0000 12/02/23 0000 03/09/24 0000  AST 15 12* 10*  ALT 7 9 7   ALKPHOS 71 78 67  ALBUMIN 3.8 3.7 3.1*   Recent Labs    06/06/23 0000 12/02/23 0000 03/09/24 0000  WBC 8.4 8.1 18.5  NEUTROABS 5,082.00 4,366.00 15,633.00  HGB 12.0 10.8* 8.1*  HCT 35* 33* 25*  PLT 240 291 305   Lab Results  Component Value Date   TSH 1.14 06/06/2023   No results found for: HGBA1C Lab Results  Component Value Date   CHOL 175 07/23/2019   HDL 54 07/23/2019   LDLCALC 105 (H) 07/23/2019   TRIG 74 07/23/2019   CHOLHDL 3.2 07/23/2019    Significant Diagnostic Results in last 30 days:  No results found.  Assessment/Plan: 1. End of life care (Primary) - unable to swallow po medications, respirations in 30's - discontinue all po medications except ativan  - start morphine  concentrate every 4 hours and Q4prn - advised nursing to notify hospice of changes  - addendum:patient expired at 11:35 am    Total time: 31 minutes  Family/ staff Communication: plan discussed with nurse and family   Labs/tests ordered: none

## 2024-06-01 DEATH — deceased
